# Patient Record
Sex: Female | Born: 1970
Health system: Southern US, Community
[De-identification: ages and names within clinical notes are randomized; demographics above are authoritative.]

## PROBLEM LIST (undated history)

## (undated) ENCOUNTER — Ambulatory Visit (HOSPITAL_COMMUNITY): Admission: EM | Payer: 59

## (undated) DIAGNOSIS — E11311 Type 2 diabetes mellitus with unspecified diabetic retinopathy with macular edema: Secondary | ICD-10-CM

## (undated) DIAGNOSIS — Z9889 Other specified postprocedural states: Secondary | ICD-10-CM

## (undated) DIAGNOSIS — I10 Essential (primary) hypertension: Secondary | ICD-10-CM

## (undated) DIAGNOSIS — E113413 Type 2 diabetes mellitus with severe nonproliferative diabetic retinopathy with macular edema, bilateral: Secondary | ICD-10-CM

## (undated) DIAGNOSIS — E1122 Type 2 diabetes mellitus with diabetic chronic kidney disease: Secondary | ICD-10-CM

## (undated) DIAGNOSIS — B001 Herpesviral vesicular dermatitis: Secondary | ICD-10-CM

## (undated) DIAGNOSIS — E1142 Type 2 diabetes mellitus with diabetic polyneuropathy: Secondary | ICD-10-CM

## (undated) DIAGNOSIS — Z201 Contact with and (suspected) exposure to tuberculosis: Secondary | ICD-10-CM

## (undated) DIAGNOSIS — G709 Myoneural disorder, unspecified: Secondary | ICD-10-CM

## (undated) DIAGNOSIS — G4730 Sleep apnea, unspecified: Secondary | ICD-10-CM

## (undated) DIAGNOSIS — F329 Major depressive disorder, single episode, unspecified: Secondary | ICD-10-CM

## (undated) DIAGNOSIS — R112 Nausea with vomiting, unspecified: Secondary | ICD-10-CM

## (undated) DIAGNOSIS — D509 Iron deficiency anemia, unspecified: Secondary | ICD-10-CM

## (undated) DIAGNOSIS — I471 Supraventricular tachycardia: Secondary | ICD-10-CM

## (undated) DIAGNOSIS — E785 Hyperlipidemia, unspecified: Secondary | ICD-10-CM

## (undated) DIAGNOSIS — N920 Excessive and frequent menstruation with regular cycle: Secondary | ICD-10-CM

## (undated) DIAGNOSIS — J069 Acute upper respiratory infection, unspecified: Secondary | ICD-10-CM

## (undated) DIAGNOSIS — M810 Age-related osteoporosis without current pathological fracture: Secondary | ICD-10-CM

## (undated) DIAGNOSIS — C069 Malignant neoplasm of mouth, unspecified: Secondary | ICD-10-CM

## (undated) DIAGNOSIS — Z8669 Personal history of other diseases of the nervous system and sense organs: Secondary | ICD-10-CM

## (undated) DIAGNOSIS — J309 Allergic rhinitis, unspecified: Secondary | ICD-10-CM

## (undated) DIAGNOSIS — A159 Respiratory tuberculosis unspecified: Secondary | ICD-10-CM

## (undated) DIAGNOSIS — E1140 Type 2 diabetes mellitus with diabetic neuropathy, unspecified: Secondary | ICD-10-CM

## (undated) DIAGNOSIS — T7840XA Allergy, unspecified, initial encounter: Secondary | ICD-10-CM

## (undated) DIAGNOSIS — H35039 Hypertensive retinopathy, unspecified eye: Secondary | ICD-10-CM

## (undated) DIAGNOSIS — H259 Unspecified age-related cataract: Secondary | ICD-10-CM

## (undated) DIAGNOSIS — N63 Unspecified lump in unspecified breast: Secondary | ICD-10-CM

## (undated) DIAGNOSIS — F5081 Binge eating disorder: Secondary | ICD-10-CM

## (undated) HISTORY — DX: Allergy, unspecified, initial encounter: T78.40XA

## (undated) HISTORY — DX: Age-related osteoporosis without current pathological fracture: M81.0

## (undated) HISTORY — DX: Sleep apnea, unspecified: G47.30

## (undated) HISTORY — DX: Type 2 diabetes mellitus with diabetic polyneuropathy: E11.42

## (undated) HISTORY — DX: Other specified postprocedural states: R11.2

## (undated) HISTORY — DX: Type 2 diabetes mellitus with unspecified diabetic retinopathy with macular edema: E11.311

## (undated) HISTORY — PX: TONSILLECTOMY: SUR1361

## (undated) HISTORY — DX: Myoneural disorder, unspecified: G70.9

## (undated) HISTORY — DX: Acute upper respiratory infection, unspecified: J06.9

## (undated) HISTORY — DX: Hyperlipidemia, unspecified: E78.5

## (undated) HISTORY — DX: Herpesviral vesicular dermatitis: B00.1

## (undated) HISTORY — DX: Nausea with vomiting, unspecified: R11.2

## (undated) HISTORY — PX: REDUCTION MAMMAPLASTY: SUR839

## (undated) HISTORY — DX: Respiratory tuberculosis unspecified: A15.9

## (undated) HISTORY — DX: Other specified postprocedural states: Z98.890

## (undated) HISTORY — DX: Hypertensive retinopathy, unspecified eye: H35.039

## (undated) HISTORY — DX: Personal history of other diseases of the nervous system and sense organs: Z86.69

## (undated) HISTORY — PX: WISDOM TOOTH EXTRACTION: SHX21

## (undated) HISTORY — DX: Essential (primary) hypertension: I10

## (undated) HISTORY — DX: Excessive and frequent menstruation with regular cycle: N92.0

## (undated) HISTORY — PX: BREAST SURGERY: SHX581

## (undated) HISTORY — PX: PHOTOCOAGULATION WITH LASER: SHX6027

---
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
## 1997-05-12 ENCOUNTER — Encounter: Admission: RE | Admit: 1997-05-12 | Discharge: 1997-05-12 | Payer: Self-pay | Admitting: Sports Medicine

## 1997-05-29 ENCOUNTER — Inpatient Hospital Stay (HOSPITAL_COMMUNITY): Admission: AD | Admit: 1997-05-29 | Discharge: 1997-05-29 | Payer: Self-pay | Admitting: *Deleted

## 1997-05-31 ENCOUNTER — Encounter: Admission: RE | Admit: 1997-05-31 | Discharge: 1997-05-31 | Payer: Self-pay | Admitting: Family Medicine

## 1997-06-03 ENCOUNTER — Encounter: Admission: RE | Admit: 1997-06-03 | Discharge: 1997-06-03 | Payer: Self-pay | Admitting: Family Medicine

## 1997-06-14 ENCOUNTER — Inpatient Hospital Stay (HOSPITAL_COMMUNITY): Admission: AD | Admit: 1997-06-14 | Discharge: 1997-06-14 | Payer: Self-pay | Admitting: Obstetrics

## 1997-06-21 ENCOUNTER — Encounter: Admission: RE | Admit: 1997-06-21 | Discharge: 1997-06-21 | Payer: Self-pay | Admitting: Family Medicine

## 1997-07-30 ENCOUNTER — Encounter: Admission: RE | Admit: 1997-07-30 | Discharge: 1997-07-30 | Payer: Self-pay | Admitting: Family Medicine

## 1997-08-02 ENCOUNTER — Observation Stay (HOSPITAL_COMMUNITY): Admission: AD | Admit: 1997-08-02 | Discharge: 1997-08-03 | Payer: Self-pay | Admitting: Obstetrics

## 1997-08-05 ENCOUNTER — Encounter: Admission: RE | Admit: 1997-08-05 | Discharge: 1997-08-05 | Payer: Self-pay | Admitting: Family Medicine

## 1997-09-03 ENCOUNTER — Encounter: Admission: RE | Admit: 1997-09-03 | Discharge: 1997-09-03 | Payer: Self-pay | Admitting: Family Medicine

## 1997-09-08 ENCOUNTER — Encounter: Admission: RE | Admit: 1997-09-08 | Discharge: 1997-09-08 | Payer: Self-pay | Admitting: Family Medicine

## 1997-09-22 ENCOUNTER — Encounter: Admission: RE | Admit: 1997-09-22 | Discharge: 1997-09-22 | Payer: Self-pay | Admitting: Family Medicine

## 1998-03-04 ENCOUNTER — Encounter: Admission: RE | Admit: 1998-03-04 | Discharge: 1998-03-04 | Payer: Self-pay | Admitting: Family Medicine

## 1998-06-01 ENCOUNTER — Encounter: Admission: RE | Admit: 1998-06-01 | Discharge: 1998-06-01 | Payer: Self-pay | Admitting: Family Medicine

## 1998-06-08 ENCOUNTER — Encounter: Admission: RE | Admit: 1998-06-08 | Discharge: 1998-06-08 | Payer: Self-pay | Admitting: Family Medicine

## 1998-06-22 ENCOUNTER — Encounter: Admission: RE | Admit: 1998-06-22 | Discharge: 1998-06-22 | Payer: Self-pay | Admitting: Family Medicine

## 1998-09-09 ENCOUNTER — Encounter: Admission: RE | Admit: 1998-09-09 | Discharge: 1998-09-09 | Payer: Self-pay | Admitting: Family Medicine

## 1998-11-29 ENCOUNTER — Encounter: Admission: RE | Admit: 1998-11-29 | Discharge: 1998-11-29 | Payer: Self-pay | Admitting: Family Medicine

## 1998-11-29 ENCOUNTER — Other Ambulatory Visit: Admission: RE | Admit: 1998-11-29 | Discharge: 1998-11-29 | Payer: Self-pay | Admitting: *Deleted

## 1998-12-02 ENCOUNTER — Encounter: Admission: RE | Admit: 1998-12-02 | Discharge: 1998-12-02 | Payer: Self-pay | Admitting: Sports Medicine

## 1999-01-18 ENCOUNTER — Encounter: Admission: RE | Admit: 1999-01-18 | Discharge: 1999-01-18 | Payer: Self-pay | Admitting: Family Medicine

## 1999-03-08 ENCOUNTER — Encounter: Admission: RE | Admit: 1999-03-08 | Discharge: 1999-03-08 | Payer: Self-pay | Admitting: Family Medicine

## 1999-06-21 ENCOUNTER — Encounter: Admission: RE | Admit: 1999-06-21 | Discharge: 1999-06-21 | Payer: Self-pay | Admitting: Family Medicine

## 1999-07-20 ENCOUNTER — Encounter: Admission: RE | Admit: 1999-07-20 | Discharge: 1999-07-20 | Payer: Self-pay | Admitting: Family Medicine

## 1999-08-11 ENCOUNTER — Encounter: Admission: RE | Admit: 1999-08-11 | Discharge: 1999-08-11 | Payer: Self-pay | Admitting: Family Medicine

## 1999-08-16 ENCOUNTER — Encounter: Payer: Self-pay | Admitting: Family Medicine

## 1999-08-16 ENCOUNTER — Encounter: Admission: RE | Admit: 1999-08-16 | Discharge: 1999-08-16 | Payer: Self-pay | Admitting: Family Medicine

## 1999-08-22 ENCOUNTER — Encounter: Admission: RE | Admit: 1999-08-22 | Discharge: 1999-11-20 | Payer: Self-pay | Admitting: Family Medicine

## 1999-09-01 ENCOUNTER — Encounter: Admission: RE | Admit: 1999-09-01 | Discharge: 1999-09-01 | Payer: Self-pay | Admitting: Family Medicine

## 1999-11-21 ENCOUNTER — Other Ambulatory Visit: Admission: RE | Admit: 1999-11-21 | Discharge: 1999-11-21 | Payer: Self-pay | Admitting: Family Medicine

## 1999-12-19 ENCOUNTER — Encounter: Admission: RE | Admit: 1999-12-19 | Discharge: 1999-12-19 | Payer: Self-pay | Admitting: Sports Medicine

## 2000-03-10 ENCOUNTER — Emergency Department (HOSPITAL_COMMUNITY): Admission: EM | Admit: 2000-03-10 | Discharge: 2000-03-10 | Payer: Self-pay | Admitting: Emergency Medicine

## 2000-03-10 ENCOUNTER — Encounter: Payer: Self-pay | Admitting: Emergency Medicine

## 2000-04-03 ENCOUNTER — Encounter: Admission: RE | Admit: 2000-04-03 | Discharge: 2000-04-03 | Payer: Self-pay | Admitting: Family Medicine

## 2000-04-17 ENCOUNTER — Encounter: Admission: RE | Admit: 2000-04-17 | Discharge: 2000-04-17 | Payer: Self-pay | Admitting: Family Medicine

## 2000-07-16 ENCOUNTER — Encounter: Admission: RE | Admit: 2000-07-16 | Discharge: 2000-07-16 | Payer: Self-pay | Admitting: Family Medicine

## 2000-08-23 ENCOUNTER — Encounter: Admission: RE | Admit: 2000-08-23 | Discharge: 2000-08-23 | Payer: Self-pay | Admitting: Family Medicine

## 2000-09-17 ENCOUNTER — Encounter: Admission: RE | Admit: 2000-09-17 | Discharge: 2000-09-17 | Payer: Self-pay | Admitting: Family Medicine

## 2001-01-01 ENCOUNTER — Encounter: Admission: RE | Admit: 2001-01-01 | Discharge: 2001-01-01 | Payer: Self-pay | Admitting: Family Medicine

## 2001-04-02 ENCOUNTER — Encounter: Admission: RE | Admit: 2001-04-02 | Discharge: 2001-04-02 | Payer: Self-pay | Admitting: Family Medicine

## 2001-04-09 ENCOUNTER — Encounter: Admission: RE | Admit: 2001-04-09 | Discharge: 2001-04-09 | Payer: Self-pay | Admitting: Family Medicine

## 2001-04-09 ENCOUNTER — Other Ambulatory Visit: Admission: RE | Admit: 2001-04-09 | Discharge: 2001-04-09 | Payer: Self-pay | Admitting: Family Medicine

## 2001-04-11 ENCOUNTER — Encounter: Admission: RE | Admit: 2001-04-11 | Discharge: 2001-04-11 | Payer: Self-pay | Admitting: Family Medicine

## 2001-04-17 ENCOUNTER — Encounter: Admission: RE | Admit: 2001-04-17 | Discharge: 2001-04-17 | Payer: Self-pay | Admitting: Family Medicine

## 2001-05-02 ENCOUNTER — Encounter: Admission: RE | Admit: 2001-05-02 | Discharge: 2001-05-02 | Payer: Self-pay | Admitting: Family Medicine

## 2001-06-03 ENCOUNTER — Encounter: Admission: RE | Admit: 2001-06-03 | Discharge: 2001-06-03 | Payer: Self-pay | Admitting: Family Medicine

## 2001-07-02 ENCOUNTER — Encounter: Admission: RE | Admit: 2001-07-02 | Discharge: 2001-07-02 | Payer: Self-pay | Admitting: Family Medicine

## 2001-09-09 ENCOUNTER — Encounter: Admission: RE | Admit: 2001-09-09 | Discharge: 2001-09-09 | Payer: Self-pay | Admitting: Family Medicine

## 2002-01-30 ENCOUNTER — Encounter: Admission: RE | Admit: 2002-01-30 | Discharge: 2002-01-30 | Payer: Self-pay | Admitting: Family Medicine

## 2002-05-18 ENCOUNTER — Encounter: Admission: RE | Admit: 2002-05-18 | Discharge: 2002-05-18 | Payer: Self-pay | Admitting: Family Medicine

## 2002-06-10 ENCOUNTER — Encounter: Admission: RE | Admit: 2002-06-10 | Discharge: 2002-06-10 | Payer: Self-pay | Admitting: Family Medicine

## 2002-11-07 IMAGING — CR DG CHEST 1V PORT
1 series · 1 of 1 positions shown · non-contrast
Comparison: None.  
 PORTABLE CHEST - 1 VIEW [DATE]:

CLINICAL DATA: Chest pain and headaches.

[view not recorded]
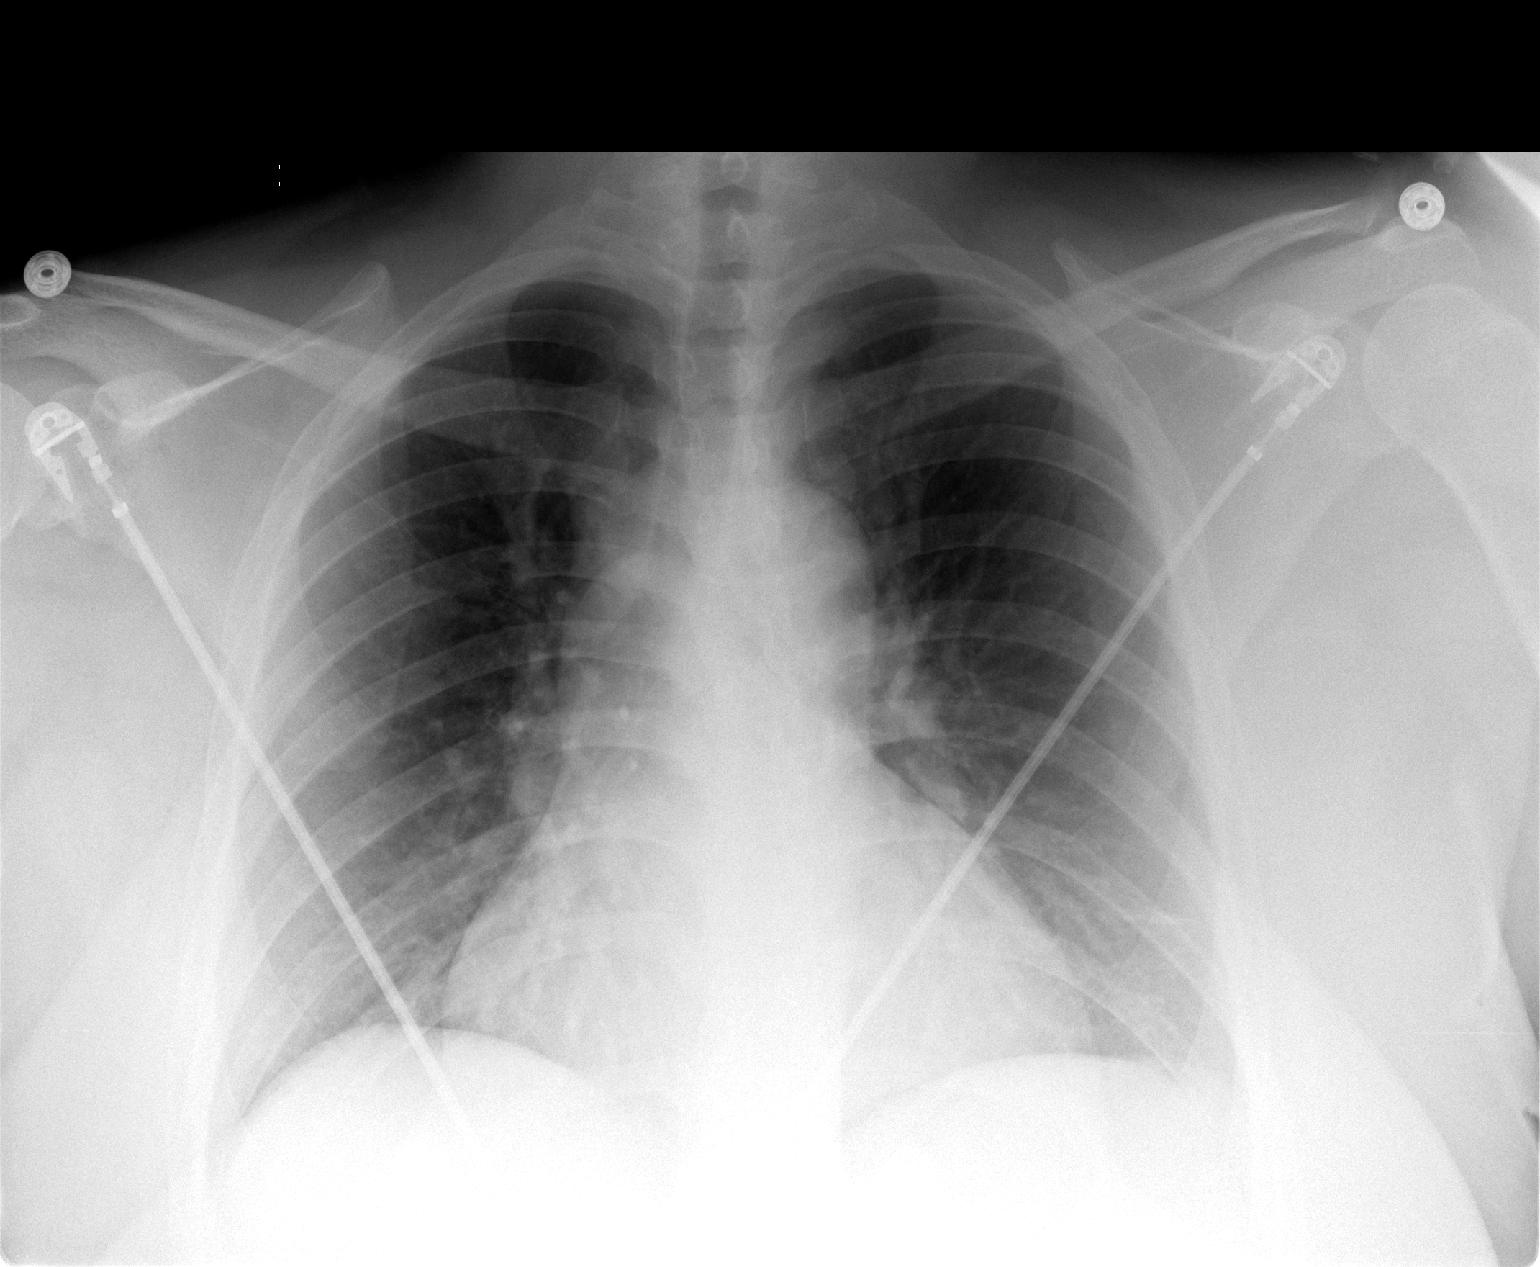

[1 of 1 positions shown; findings below may reference images not displayed]

FINDINGS: AP film at [XH] hours shows low volumes with accentuation of the cardio-pericardial silhouette.  Minimal atelectasis noted at the left base.  No edema or focal infiltrate.
IMPRESSION: 1.  Low volume film accentuates the cardiac silhouette.  Two view chest x-ray is recommended to more thoroughly characterize cardiomediastinal anatomy.  
 2.  Atelectasis of the left base.

## 2002-11-23 ENCOUNTER — Encounter: Admission: RE | Admit: 2002-11-23 | Discharge: 2002-11-23 | Payer: Self-pay | Admitting: Family Medicine

## 2003-06-10 ENCOUNTER — Encounter: Admission: RE | Admit: 2003-06-10 | Discharge: 2003-06-10 | Payer: Self-pay | Admitting: Family Medicine

## 2003-07-01 ENCOUNTER — Encounter: Admission: RE | Admit: 2003-07-01 | Discharge: 2003-07-01 | Payer: Self-pay | Admitting: Family Medicine

## 2003-11-09 ENCOUNTER — Ambulatory Visit: Payer: Self-pay | Admitting: Family Medicine

## 2003-11-26 ENCOUNTER — Ambulatory Visit: Payer: Self-pay | Admitting: Sports Medicine

## 2003-11-26 ENCOUNTER — Other Ambulatory Visit: Admission: RE | Admit: 2003-11-26 | Discharge: 2003-11-26 | Payer: Self-pay | Admitting: Family Medicine

## 2004-02-24 ENCOUNTER — Ambulatory Visit: Payer: Self-pay | Admitting: Family Medicine

## 2004-03-01 ENCOUNTER — Ambulatory Visit: Payer: Self-pay | Admitting: Family Medicine

## 2004-07-01 ENCOUNTER — Emergency Department (HOSPITAL_COMMUNITY): Admission: EM | Admit: 2004-07-01 | Discharge: 2004-07-01 | Payer: Self-pay | Admitting: Emergency Medicine

## 2004-07-05 ENCOUNTER — Emergency Department (HOSPITAL_COMMUNITY): Admission: EM | Admit: 2004-07-05 | Discharge: 2004-07-05 | Payer: Self-pay | Admitting: *Deleted

## 2004-07-07 ENCOUNTER — Ambulatory Visit: Payer: Self-pay | Admitting: Family Medicine

## 2004-10-03 ENCOUNTER — Ambulatory Visit: Payer: Self-pay | Admitting: Family Medicine

## 2005-01-22 ENCOUNTER — Ambulatory Visit: Payer: Self-pay | Admitting: Family Medicine

## 2005-03-12 ENCOUNTER — Ambulatory Visit: Payer: Self-pay | Admitting: Sports Medicine

## 2005-05-17 ENCOUNTER — Emergency Department (HOSPITAL_COMMUNITY): Admission: EM | Admit: 2005-05-17 | Discharge: 2005-05-17 | Payer: Self-pay | Admitting: Emergency Medicine

## 2005-05-17 IMAGING — US US ABDOMEN COMPLETE
1 series · 14 of 25 positions shown · non-contrast
Comparison: none

CLINICAL DATA: Abdominal pain with nausea and some itching.
 ABDOMEN ULTRASOUND:
TECHNIQUE: Complete abdominal ultrasound examination was performed including evaluation of the liver, gallbladder, bile ducts, pancreas, kidneys, spleen, IVC, and abdominal aorta.

[Series 1: unknown · 0.37mm/px · 14 of 53 slices shown]
[im 1/53]
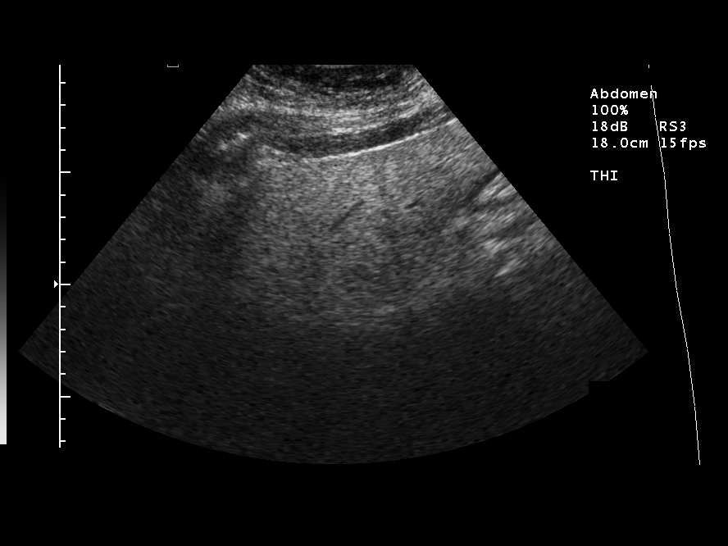
[im 5/53]
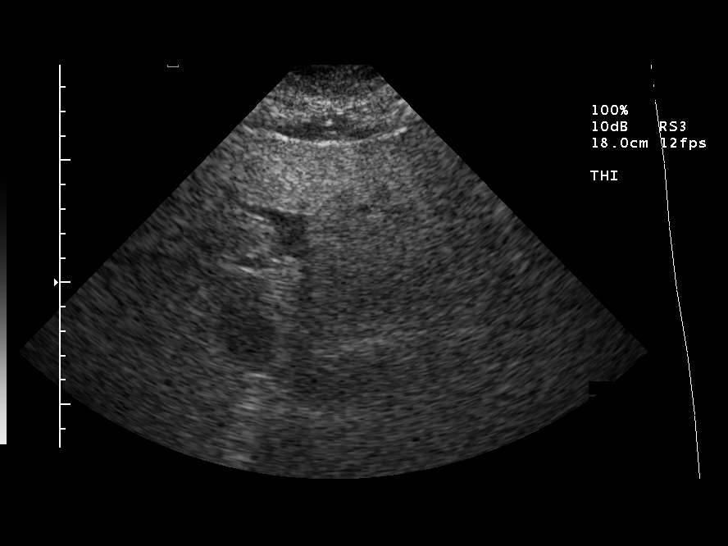
[im 9/53]
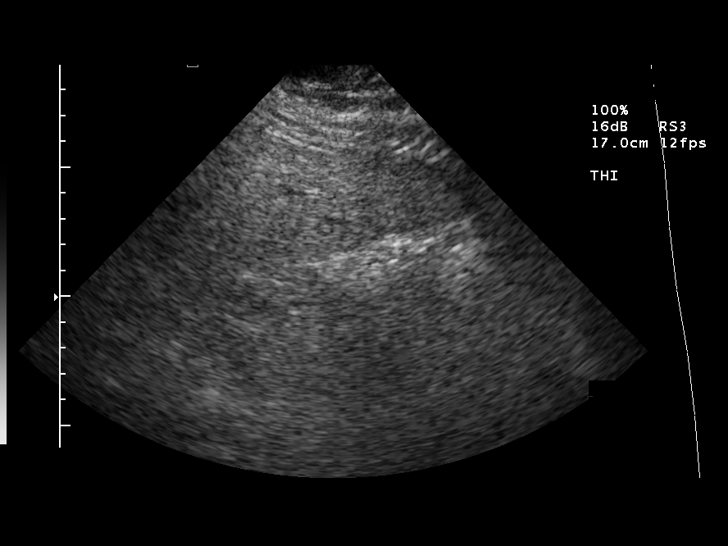
[im 14/53]
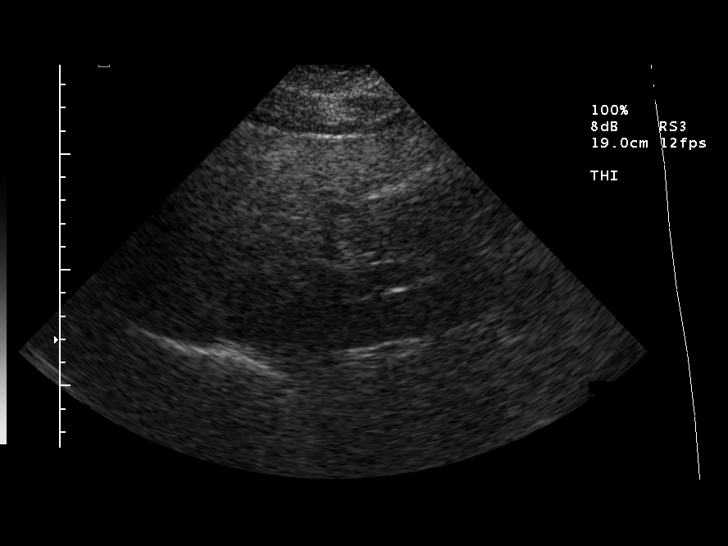
[im 18/53]
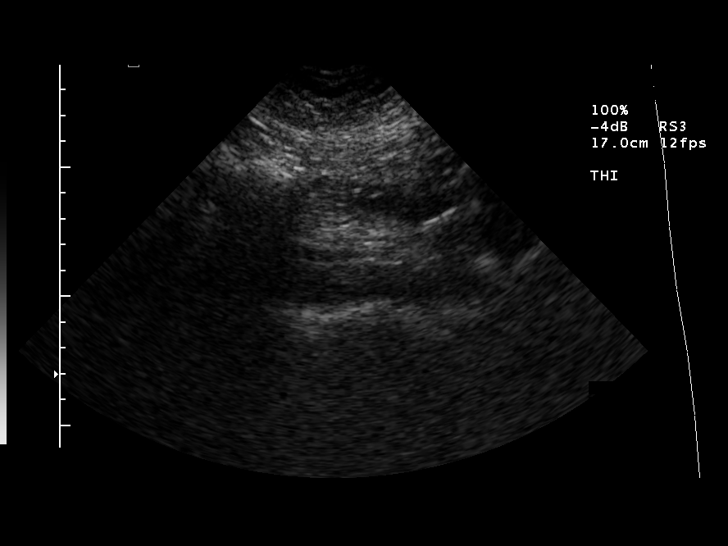
[im 20/53]
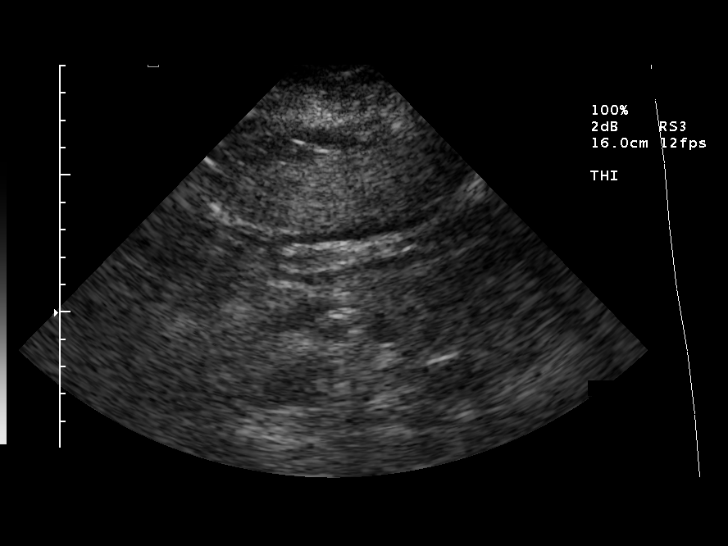
[im 24/53]
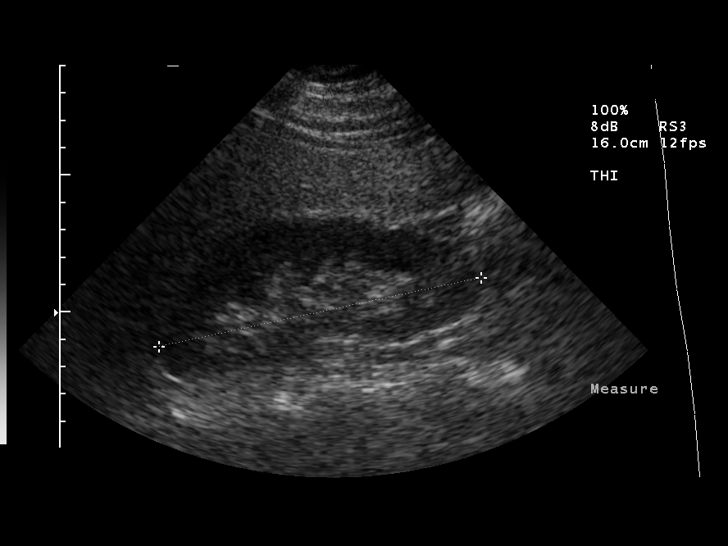
[im 29/53]
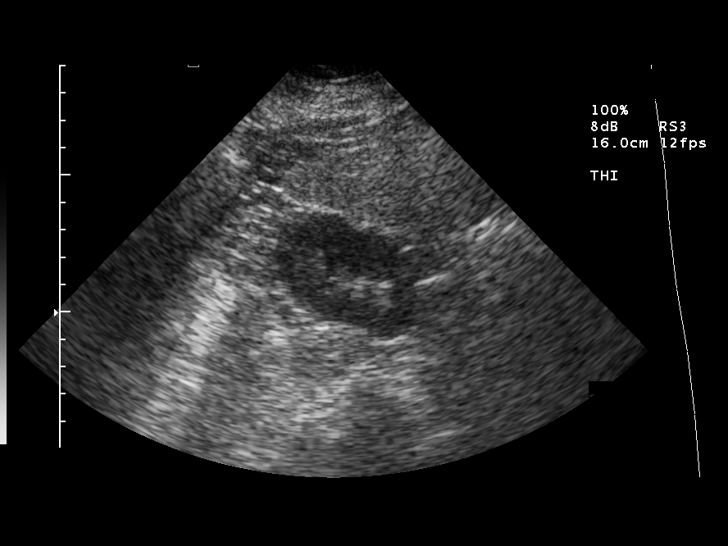
[im 33/53]
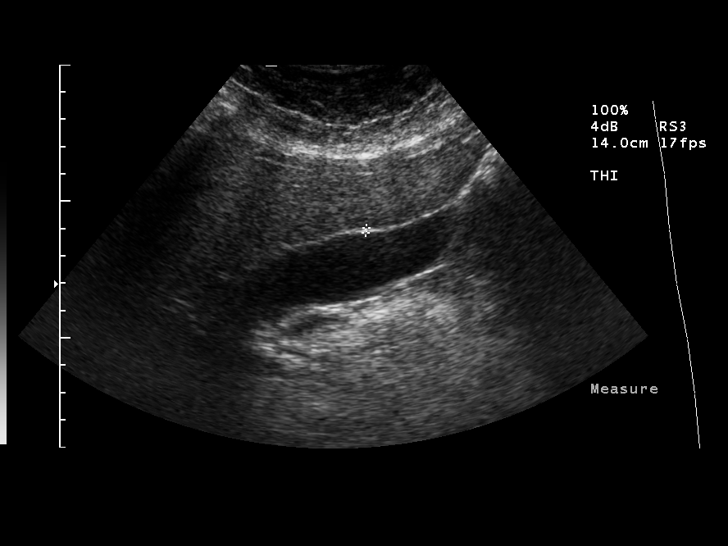
[im 35/53]
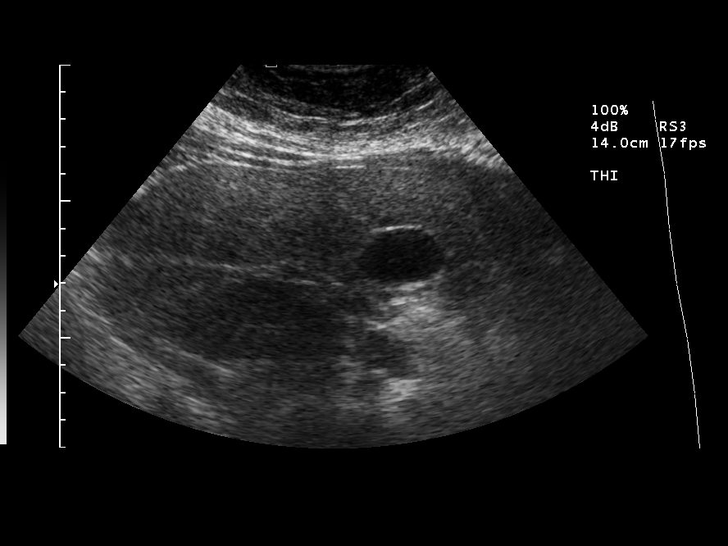
[im 40/53]
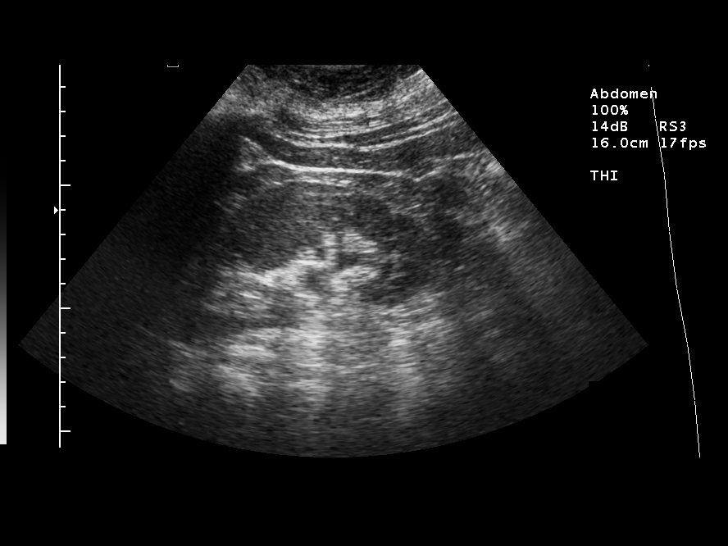
[im 44/53]
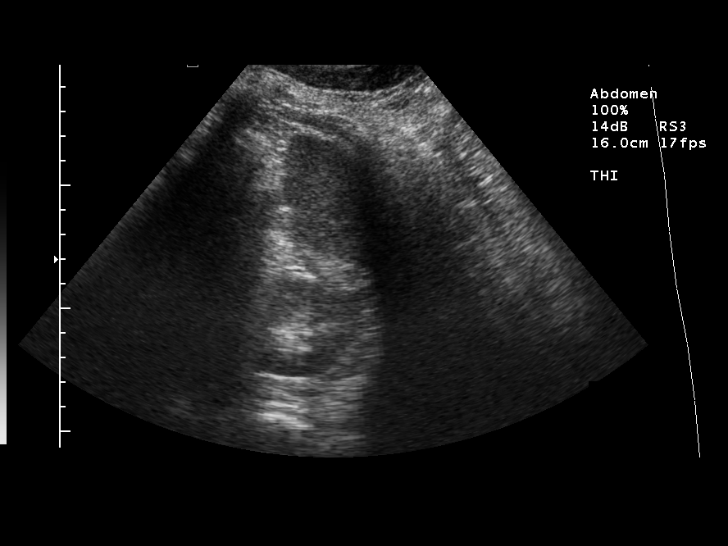
[im 48/53]
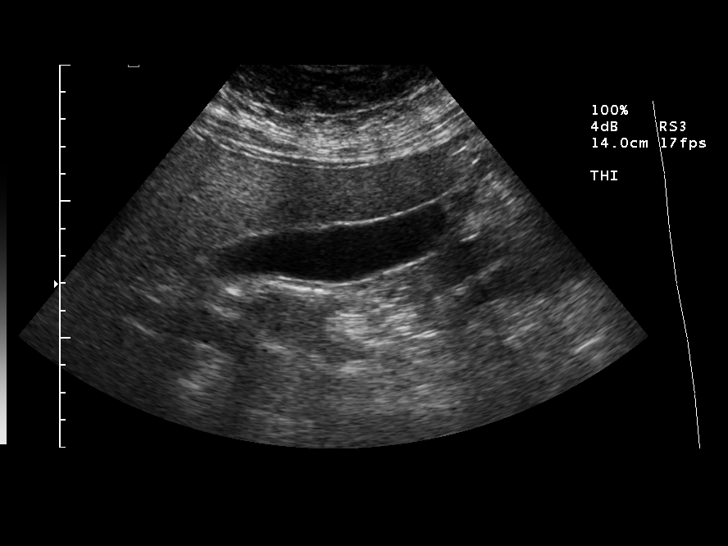
[im 53/53]
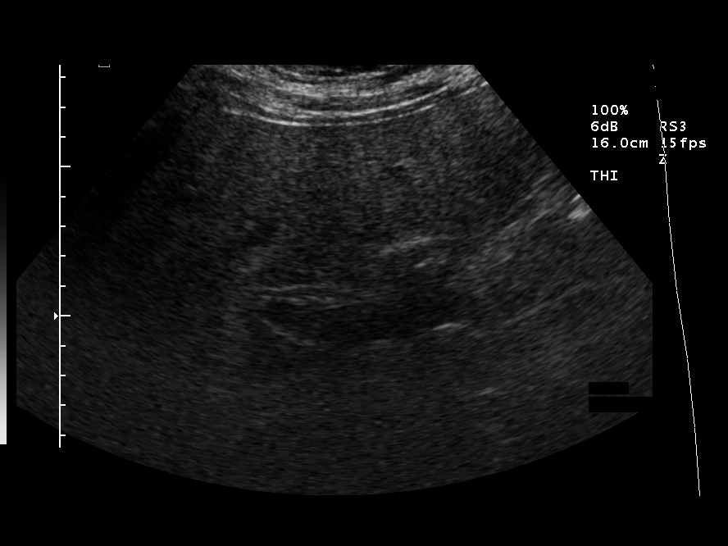

[14 of 25 positions shown; findings below may reference images not displayed]

FINDINGS: Multiple scans of the abdomen are made and show the gallbladder to be normal with a wall thickness of 1.8 mm.  The common bile duct measures 4.4 mm in diameter.  The liver is dense and echogenic consistent with fatty infiltration.  The IVC and pancreas appear normal.  The spleen is normal measuring 11.2 cm.  The right kidney is normal measuring 12.0 cm.  The left kidney is normal and measures 11.2 cm.  The abdominal aorta is normal and measures 2.2 cm.
IMPRESSION: The liver is diffusely dense and echogenic with no focal mass.  The gallbladder, common bile duct, and pancreas appear normal.

## 2005-06-14 ENCOUNTER — Ambulatory Visit: Payer: Self-pay | Admitting: Family Medicine

## 2005-06-15 ENCOUNTER — Encounter: Admission: RE | Admit: 2005-06-15 | Discharge: 2005-06-15 | Payer: Self-pay | Admitting: Family Medicine

## 2005-06-15 IMAGING — CR DG HAND COMPLETE 3+V*L*
3 series · 3 of 3 positions shown · non-contrast
Comparison: none

CLINICAL DATA: Pain and swelling of 2nd to 5th digits.  No known injury. 
LEFT HAND ? 3 VIEW:

[view not recorded (1 of 3)]
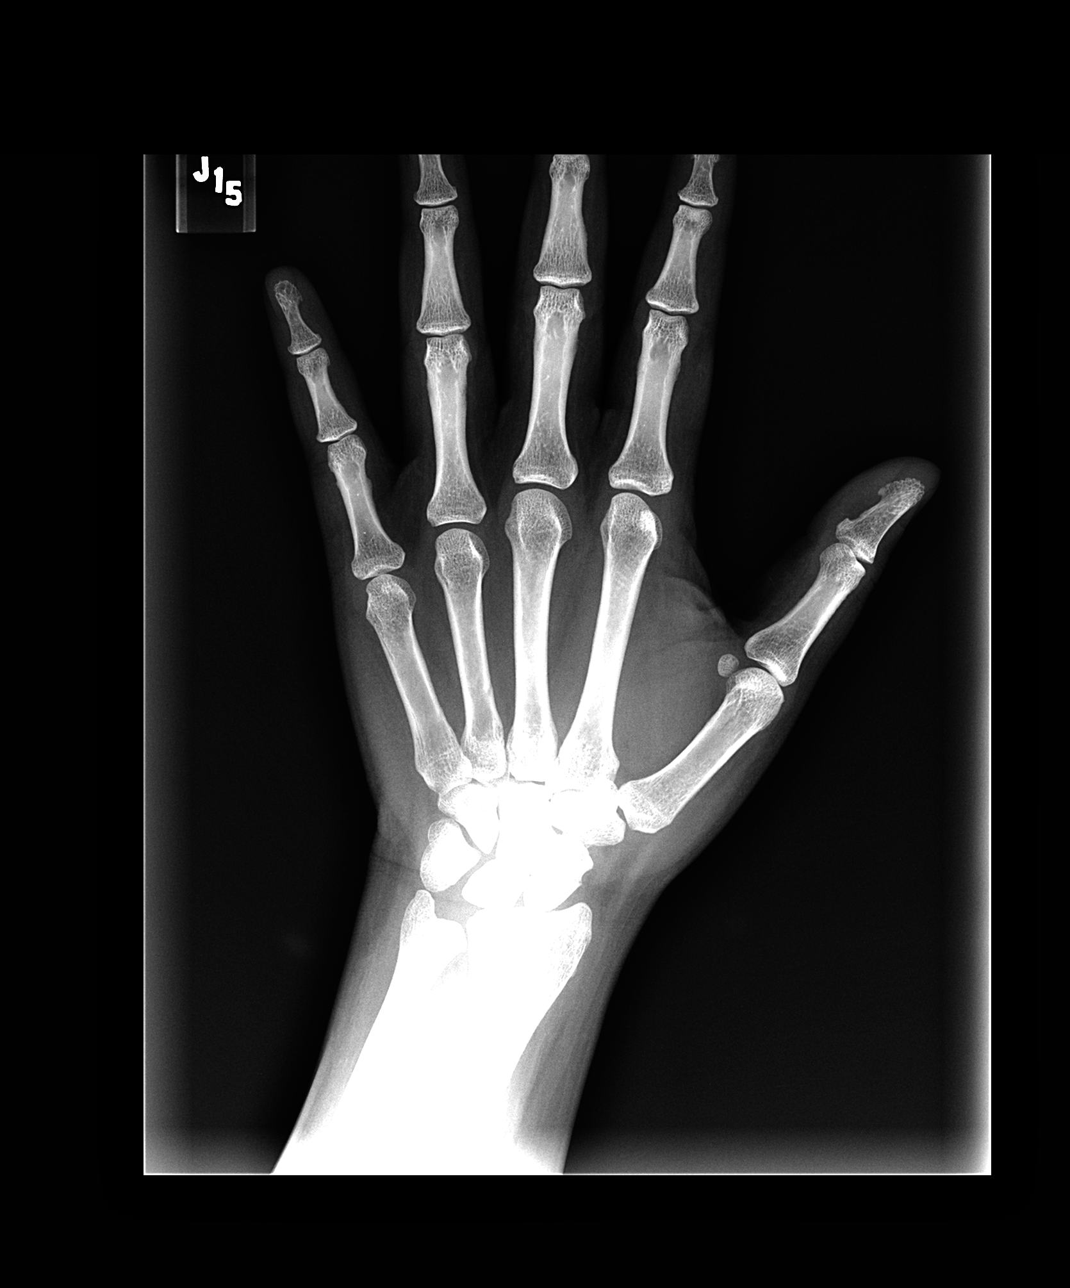

[view not recorded (2 of 3)]
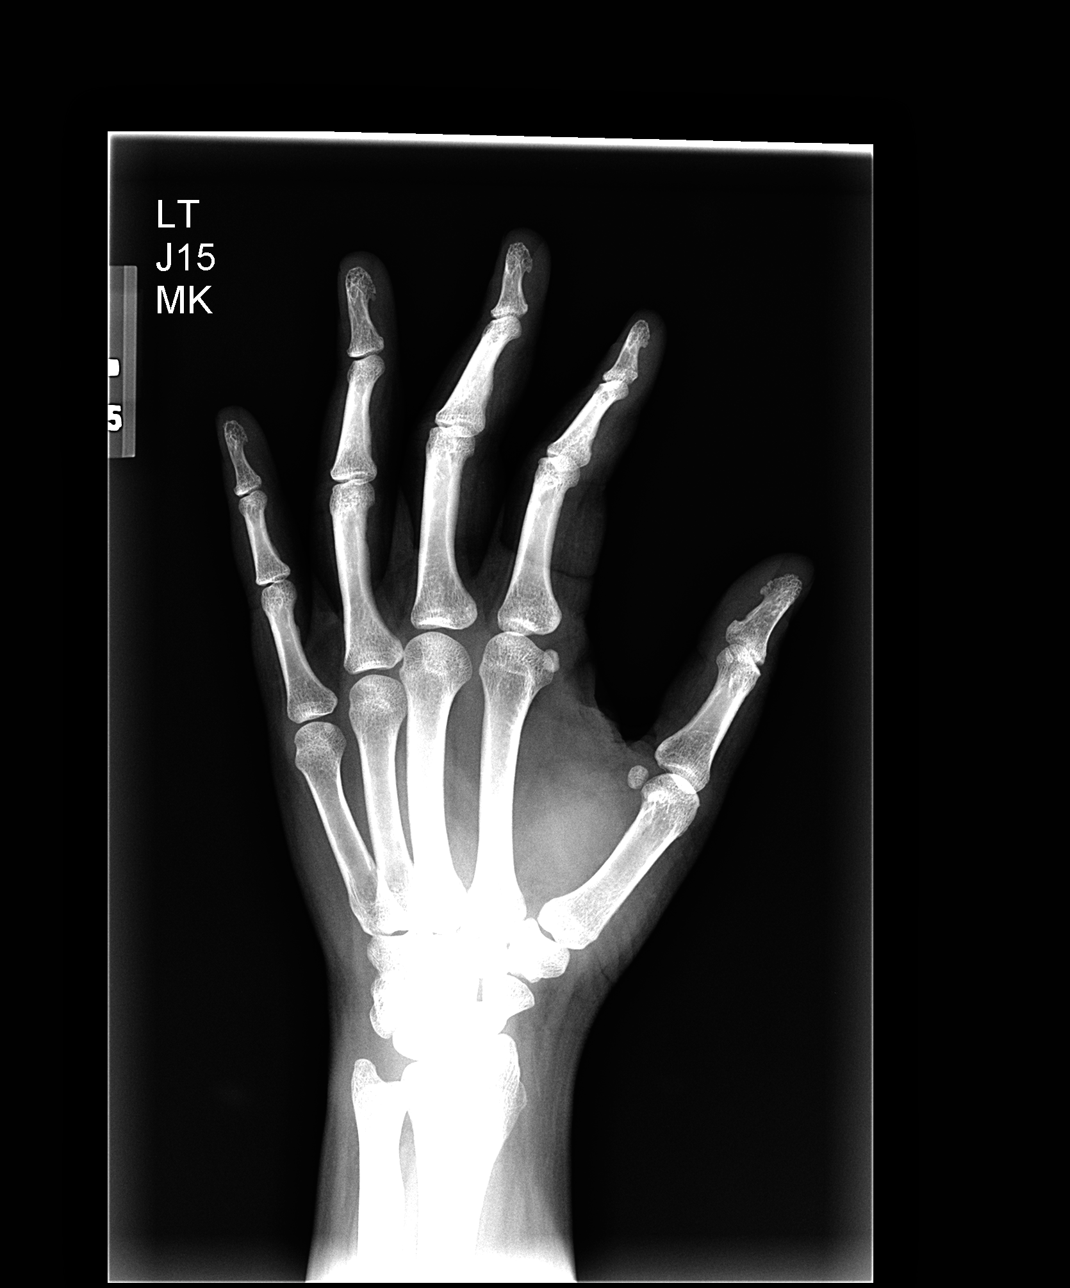

[view not recorded (3 of 3)]
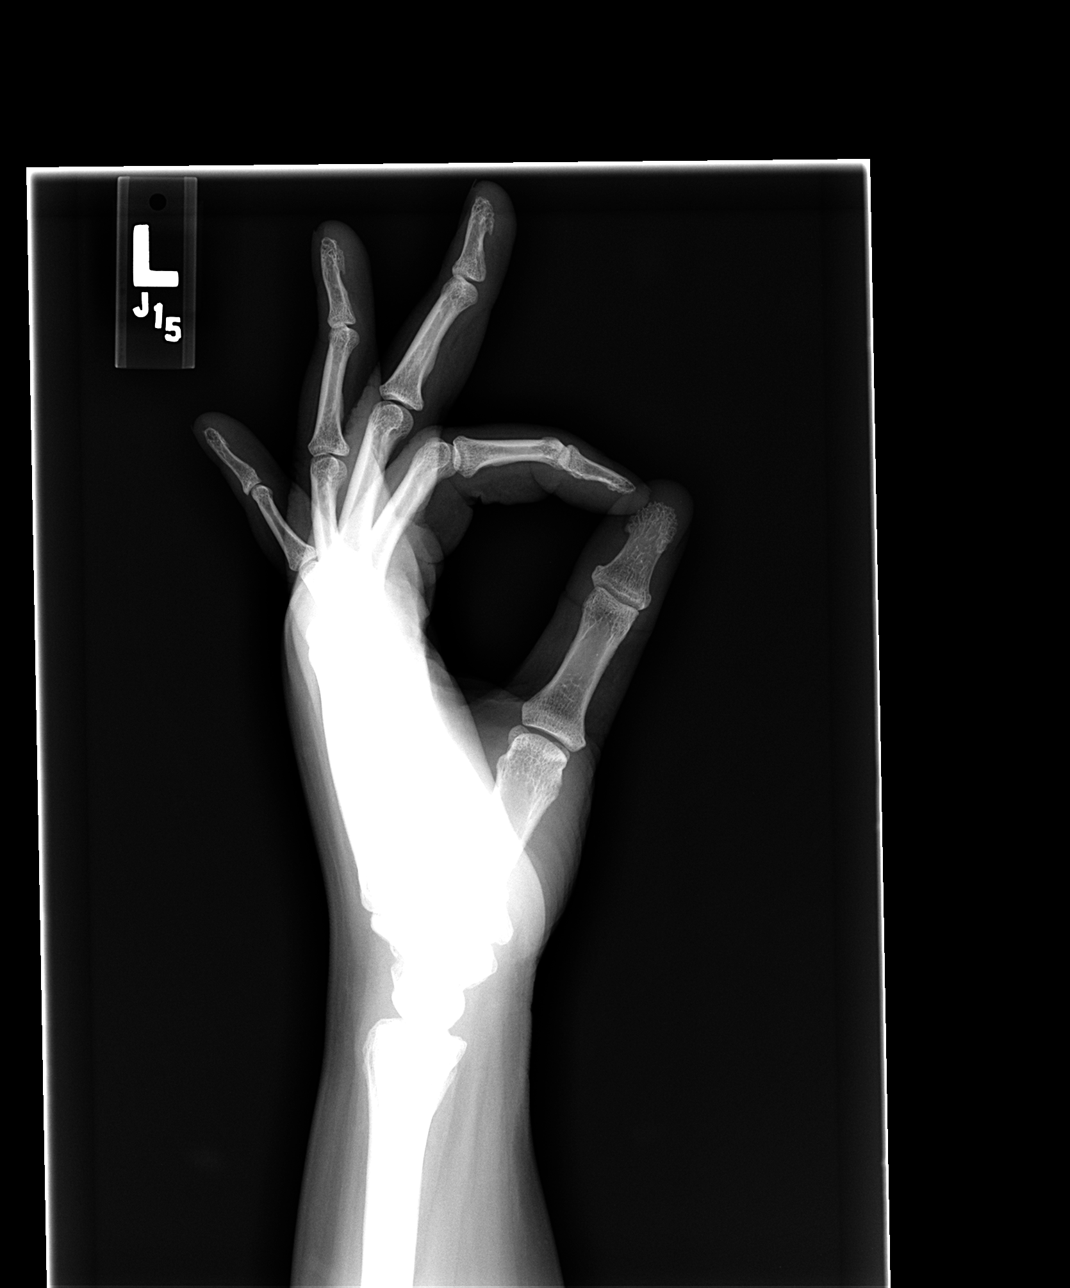

[3 of 3 positions shown; findings below may reference images not displayed]

FINDINGS: Joint spaces and alignment are maintained.  No erosions are identified.  Soft tissues appear normal.  There is no fracture or dislocation.  The patient has mild ulnar minus variance.
IMPRESSION: No acute finding.  Negative for inflammatory or degenerative arthropathy. 
RIGHT HAND ? 3 VIEW:
FINDINGS: Joint spaces and alignment are maintained.  No erosions are identified.  No soft tissue swelling is seen.  Mild ulnar minus variance is noted.
IMPRESSION: Negative for inflammatory or degenerative arthropathy.  No acute finding.

## 2005-06-15 IMAGING — CR DG HAND COMPLETE 3+V*R*
3 series · 3 of 3 positions shown · non-contrast
Comparison: none

CLINICAL DATA: Pain and swelling of 2nd to 5th digits.  No known injury. 
LEFT HAND ? 3 VIEW:

[view not recorded (1 of 3)]
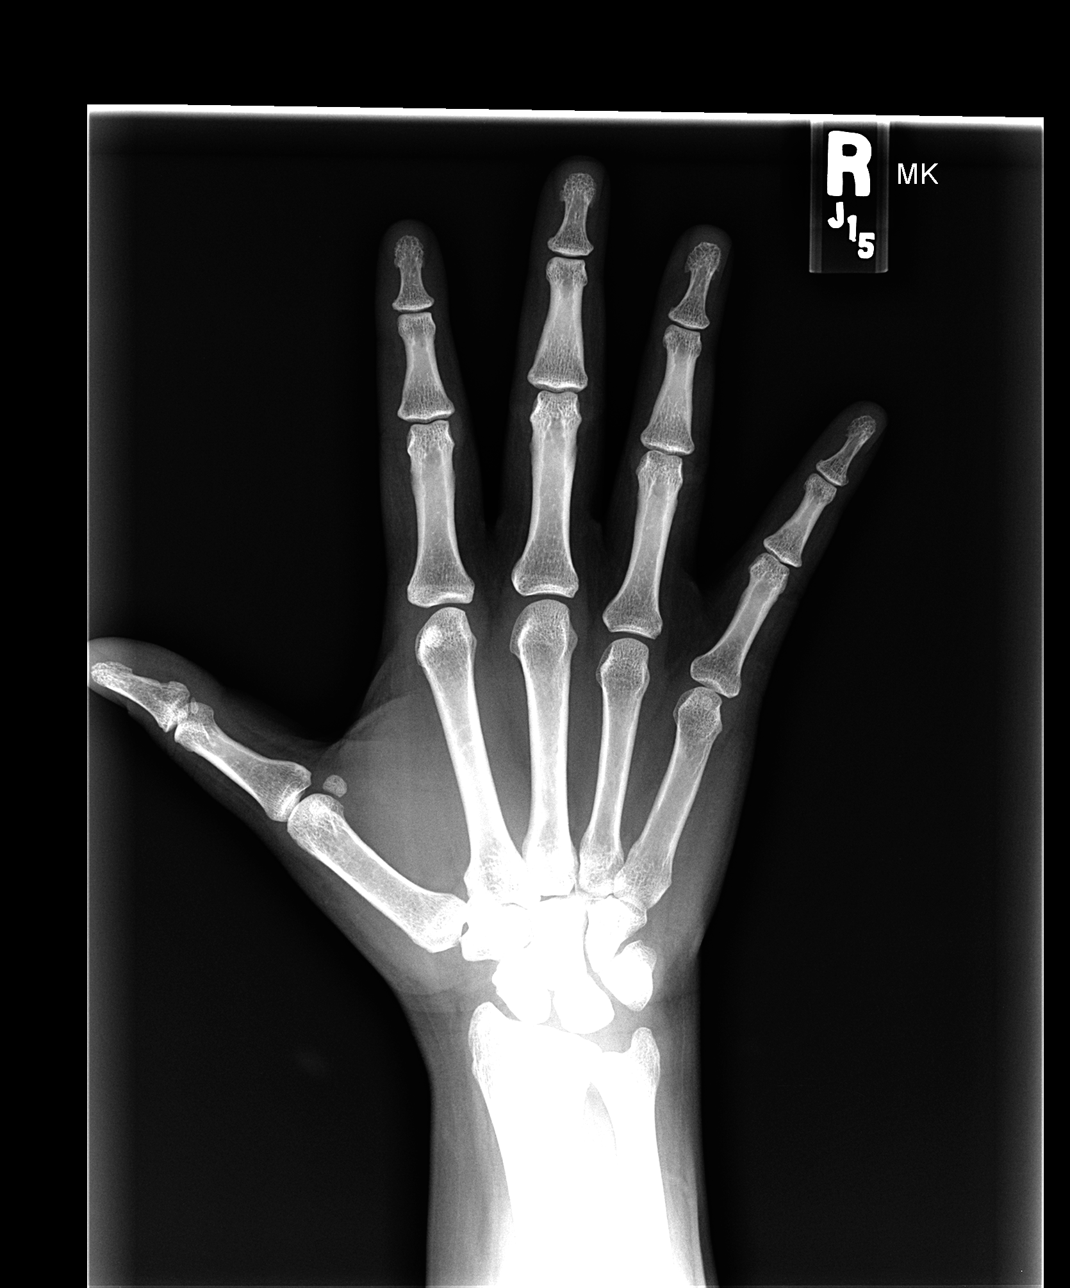

[view not recorded (2 of 3)]
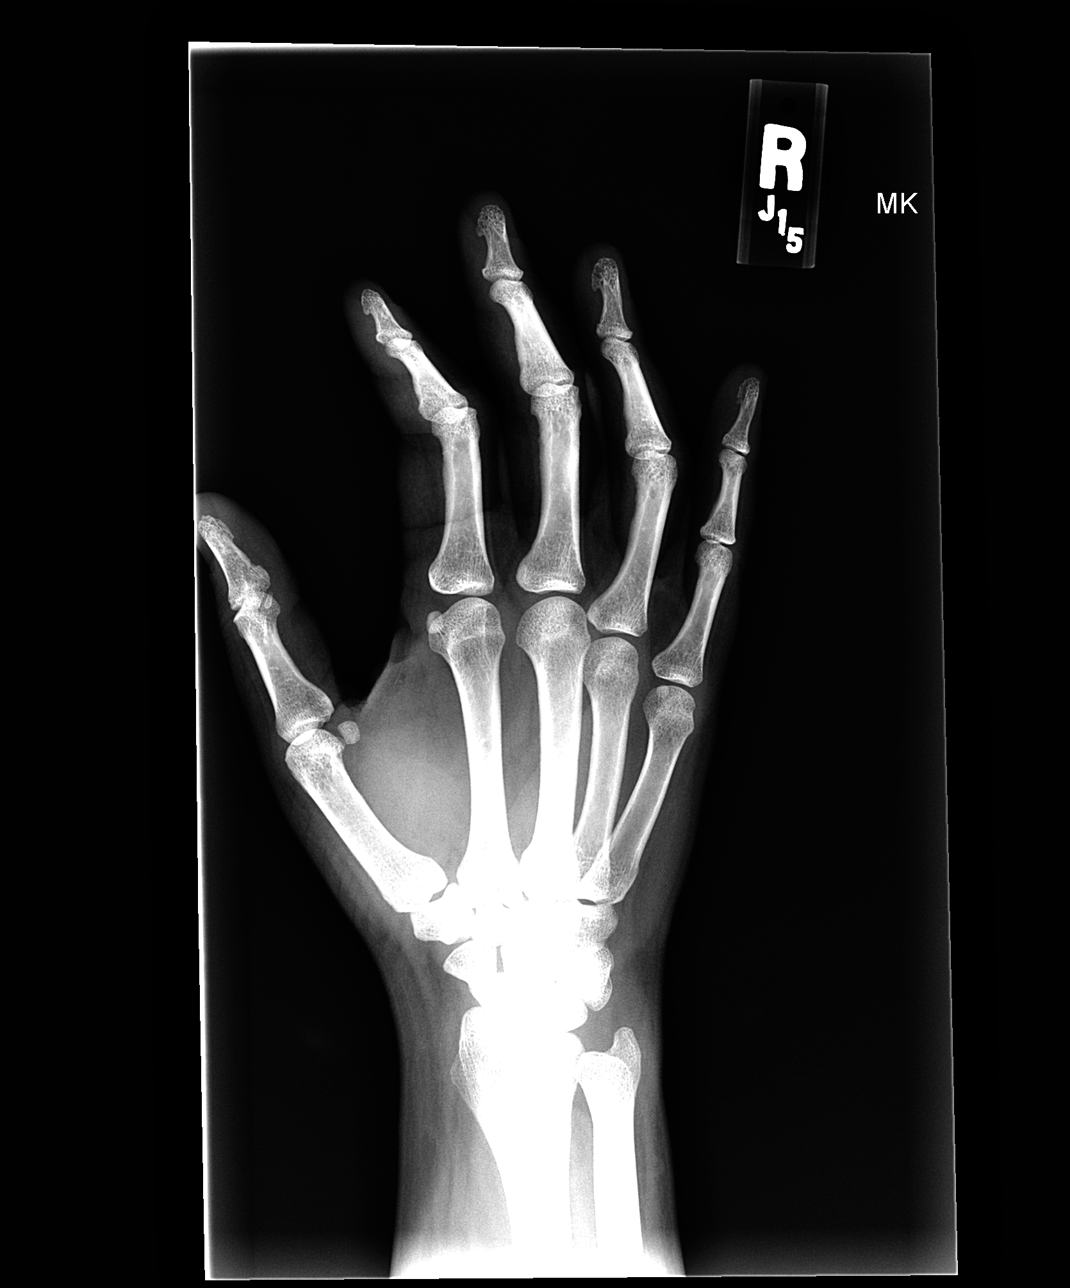

[view not recorded (3 of 3)]
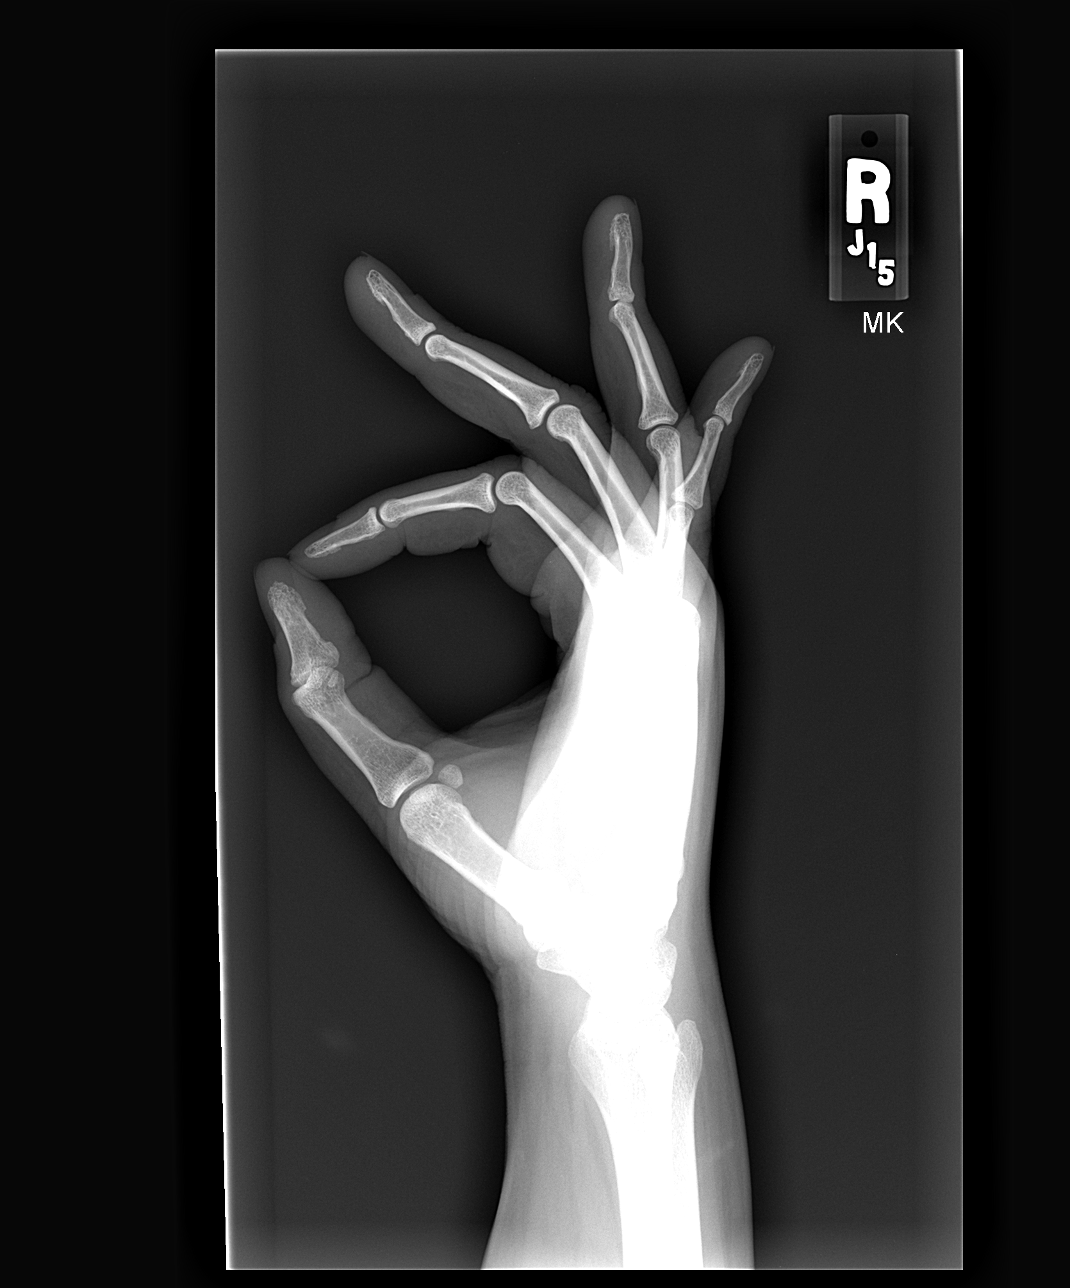

[3 of 3 positions shown; findings below may reference images not displayed]

FINDINGS: Joint spaces and alignment are maintained.  No erosions are identified.  Soft tissues appear normal.  There is no fracture or dislocation.  The patient has mild ulnar minus variance.
IMPRESSION: No acute finding.  Negative for inflammatory or degenerative arthropathy. 
RIGHT HAND ? 3 VIEW:
FINDINGS: Joint spaces and alignment are maintained.  No erosions are identified.  No soft tissue swelling is seen.  Mild ulnar minus variance is noted.
IMPRESSION: Negative for inflammatory or degenerative arthropathy.  No acute finding.

## 2005-07-05 ENCOUNTER — Ambulatory Visit: Payer: Self-pay | Admitting: Family Medicine

## 2005-07-06 ENCOUNTER — Encounter (INDEPENDENT_AMBULATORY_CARE_PROVIDER_SITE_OTHER): Payer: Self-pay | Admitting: *Deleted

## 2005-07-06 LAB — CONVERTED CEMR LAB

## 2005-07-26 ENCOUNTER — Ambulatory Visit: Payer: Self-pay | Admitting: Family Medicine

## 2005-07-26 ENCOUNTER — Other Ambulatory Visit: Admission: RE | Admit: 2005-07-26 | Discharge: 2005-07-26 | Payer: Self-pay | Admitting: Family Medicine

## 2005-11-02 ENCOUNTER — Ambulatory Visit: Payer: Self-pay | Admitting: Family Medicine

## 2005-12-26 ENCOUNTER — Ambulatory Visit: Payer: Self-pay | Admitting: Family Medicine

## 2006-04-02 ENCOUNTER — Ambulatory Visit: Payer: Self-pay | Admitting: Family Medicine

## 2006-04-04 DIAGNOSIS — D509 Iron deficiency anemia, unspecified: Secondary | ICD-10-CM

## 2006-04-04 DIAGNOSIS — E1159 Type 2 diabetes mellitus with other circulatory complications: Secondary | ICD-10-CM

## 2006-04-04 DIAGNOSIS — F3289 Other specified depressive episodes: Secondary | ICD-10-CM | POA: Insufficient documentation

## 2006-04-04 DIAGNOSIS — E669 Obesity, unspecified: Secondary | ICD-10-CM | POA: Insufficient documentation

## 2006-04-04 DIAGNOSIS — I10 Essential (primary) hypertension: Secondary | ICD-10-CM

## 2006-04-04 DIAGNOSIS — F329 Major depressive disorder, single episode, unspecified: Secondary | ICD-10-CM

## 2006-04-04 DIAGNOSIS — J309 Allergic rhinitis, unspecified: Secondary | ICD-10-CM | POA: Insufficient documentation

## 2006-04-04 DIAGNOSIS — I152 Hypertension secondary to endocrine disorders: Secondary | ICD-10-CM

## 2006-04-04 DIAGNOSIS — N920 Excessive and frequent menstruation with regular cycle: Secondary | ICD-10-CM

## 2006-04-04 HISTORY — DX: Iron deficiency anemia, unspecified: D50.9

## 2006-04-04 HISTORY — DX: Major depressive disorder, single episode, unspecified: F32.9

## 2006-04-04 HISTORY — DX: Allergic rhinitis, unspecified: J30.9

## 2006-04-04 HISTORY — DX: Other specified depressive episodes: F32.89

## 2006-04-04 HISTORY — DX: Excessive and frequent menstruation with regular cycle: N92.0

## 2006-04-04 HISTORY — DX: Type 2 diabetes mellitus with other circulatory complications: E11.59

## 2006-04-04 HISTORY — DX: Hypertension secondary to endocrine disorders: I15.2

## 2006-04-05 ENCOUNTER — Encounter (INDEPENDENT_AMBULATORY_CARE_PROVIDER_SITE_OTHER): Payer: Self-pay | Admitting: *Deleted

## 2006-05-29 ENCOUNTER — Telehealth (INDEPENDENT_AMBULATORY_CARE_PROVIDER_SITE_OTHER): Payer: Self-pay | Admitting: *Deleted

## 2006-05-31 ENCOUNTER — Telehealth (INDEPENDENT_AMBULATORY_CARE_PROVIDER_SITE_OTHER): Payer: Self-pay | Admitting: Family Medicine

## 2006-11-07 ENCOUNTER — Telehealth (INDEPENDENT_AMBULATORY_CARE_PROVIDER_SITE_OTHER): Payer: Self-pay | Admitting: Family Medicine

## 2006-11-28 ENCOUNTER — Telehealth (INDEPENDENT_AMBULATORY_CARE_PROVIDER_SITE_OTHER): Payer: Self-pay | Admitting: *Deleted

## 2006-12-19 ENCOUNTER — Ambulatory Visit: Payer: Self-pay | Admitting: Family Medicine

## 2006-12-19 ENCOUNTER — Encounter (INDEPENDENT_AMBULATORY_CARE_PROVIDER_SITE_OTHER): Payer: Self-pay | Admitting: Family Medicine

## 2006-12-19 LAB — CONVERTED CEMR LAB
Albumin: 3.9 g/dL (ref 3.5–5.2)
Alkaline Phosphatase: 86 units/L (ref 39–117)
Blood in Urine, dipstick: NEGATIVE
Calcium: 9.6 mg/dL (ref 8.4–10.5)
Glucose, Bld: 191 mg/dL — ABNORMAL HIGH (ref 70–99)
Glucose, Urine, Semiquant: NEGATIVE
Hemoglobin: 12.6 g/dL (ref 12.0–15.0)
Hgb A1c MFr Bld: 10 %
MCV: 85.1 fL (ref 78.0–100.0)
Platelets: 428 10*3/uL — ABNORMAL HIGH (ref 150–400)
Potassium: 4.2 meq/L (ref 3.5–5.3)
RBC: 4.62 M/uL (ref 3.87–5.11)
Specific Gravity, Urine: 1.025
Total Bilirubin: 0.5 mg/dL (ref 0.3–1.2)
WBC Urine, dipstick: NEGATIVE
WBC: 9.3 10*3/uL (ref 4.0–10.5)
pH: 6

## 2006-12-24 ENCOUNTER — Encounter (INDEPENDENT_AMBULATORY_CARE_PROVIDER_SITE_OTHER): Payer: Self-pay | Admitting: Family Medicine

## 2007-01-10 ENCOUNTER — Ambulatory Visit: Payer: Self-pay | Admitting: Family Medicine

## 2007-03-20 ENCOUNTER — Telehealth (INDEPENDENT_AMBULATORY_CARE_PROVIDER_SITE_OTHER): Payer: Self-pay | Admitting: Family Medicine

## 2007-03-27 ENCOUNTER — Ambulatory Visit: Payer: Self-pay | Admitting: Family Medicine

## 2007-04-26 ENCOUNTER — Emergency Department (HOSPITAL_COMMUNITY): Admission: EM | Admit: 2007-04-26 | Discharge: 2007-04-26 | Payer: Self-pay | Admitting: Emergency Medicine

## 2007-04-26 IMAGING — CR DG CHEST 2V
2 series · 2 of 2 positions shown · non-contrast
Comparison: [DATE].

CLINICAL DATA: Nausea, vomiting, and shortness of breath.
 CHEST - 2 VIEW: 
 PA and lateral chest - [DATE].

[w chest pa]
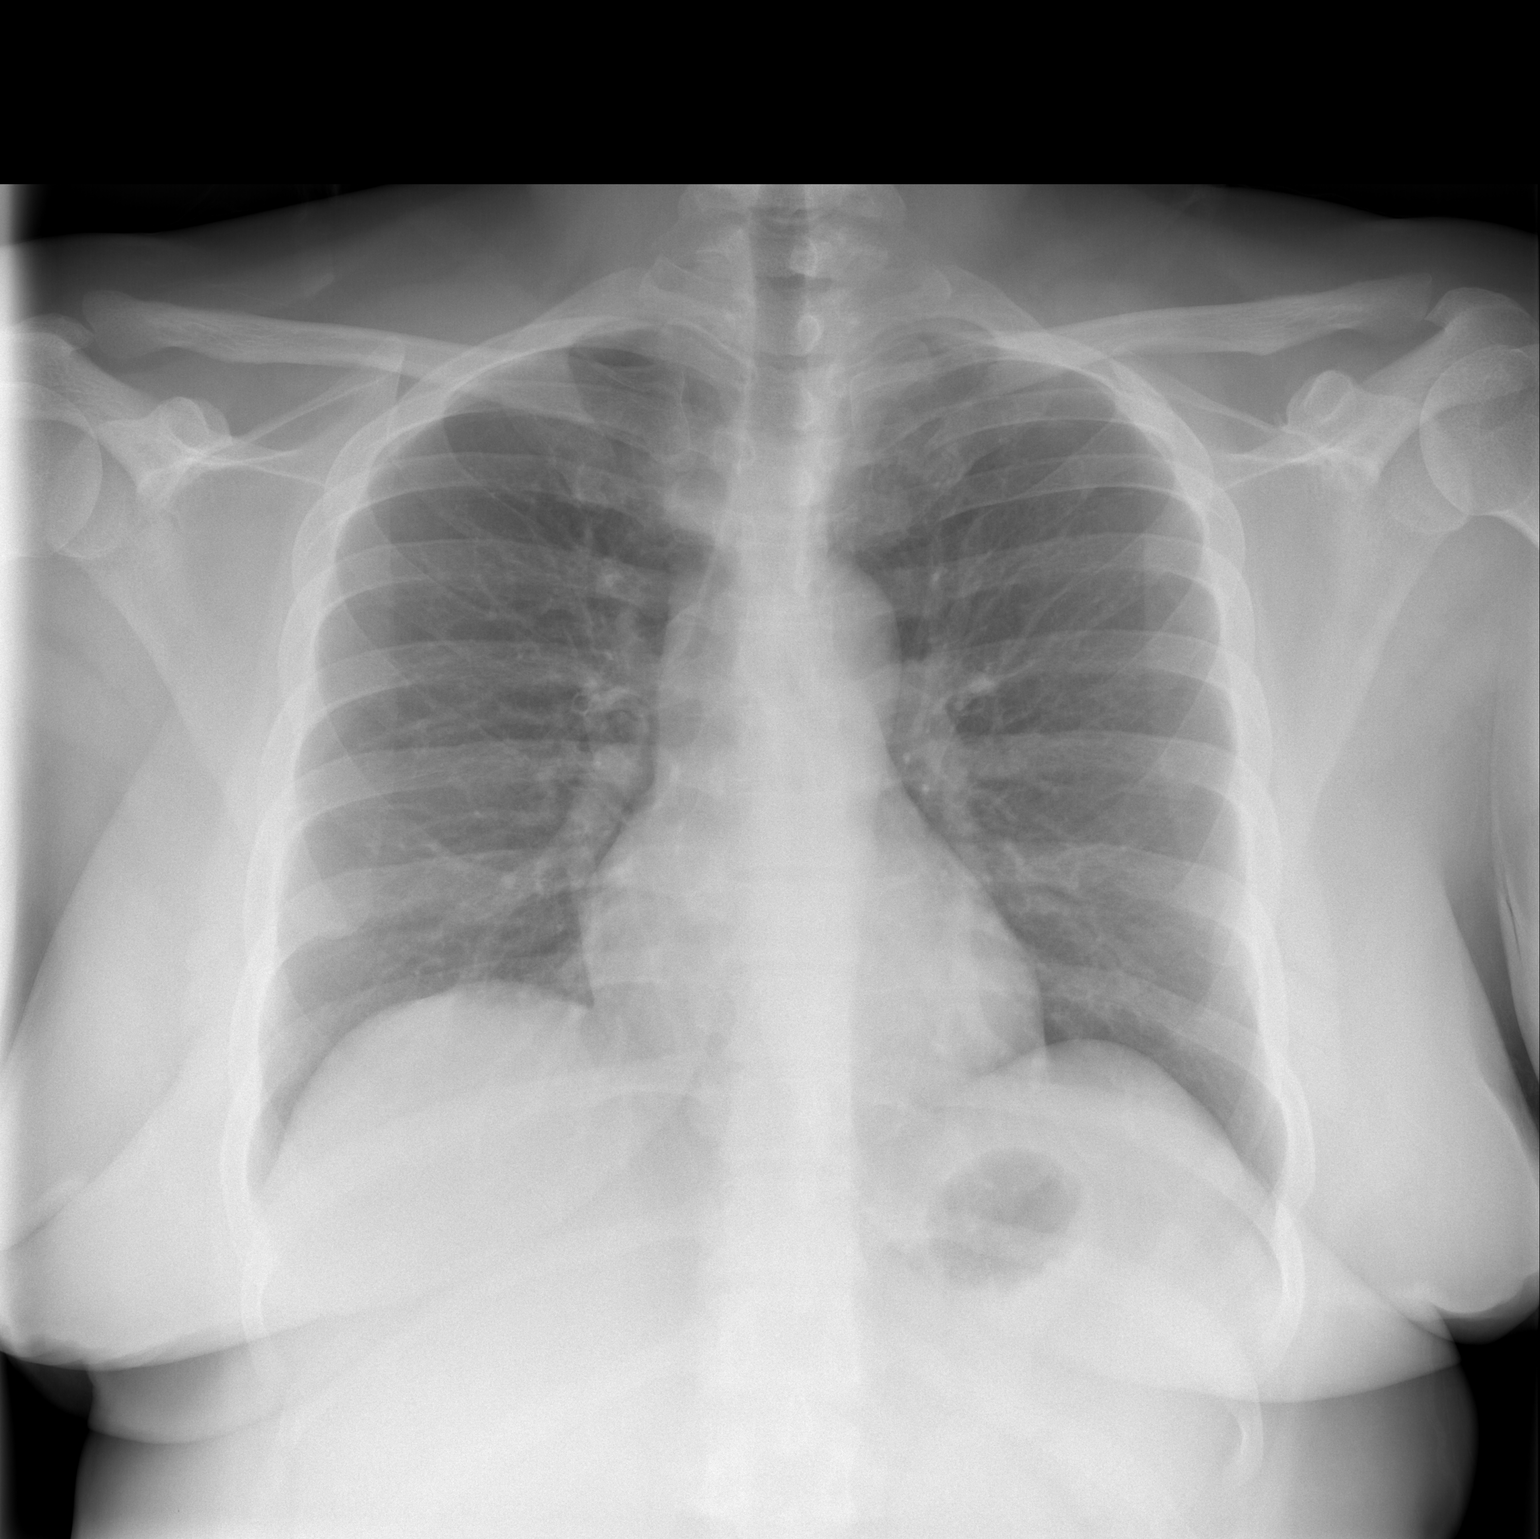

[w chest lat *]
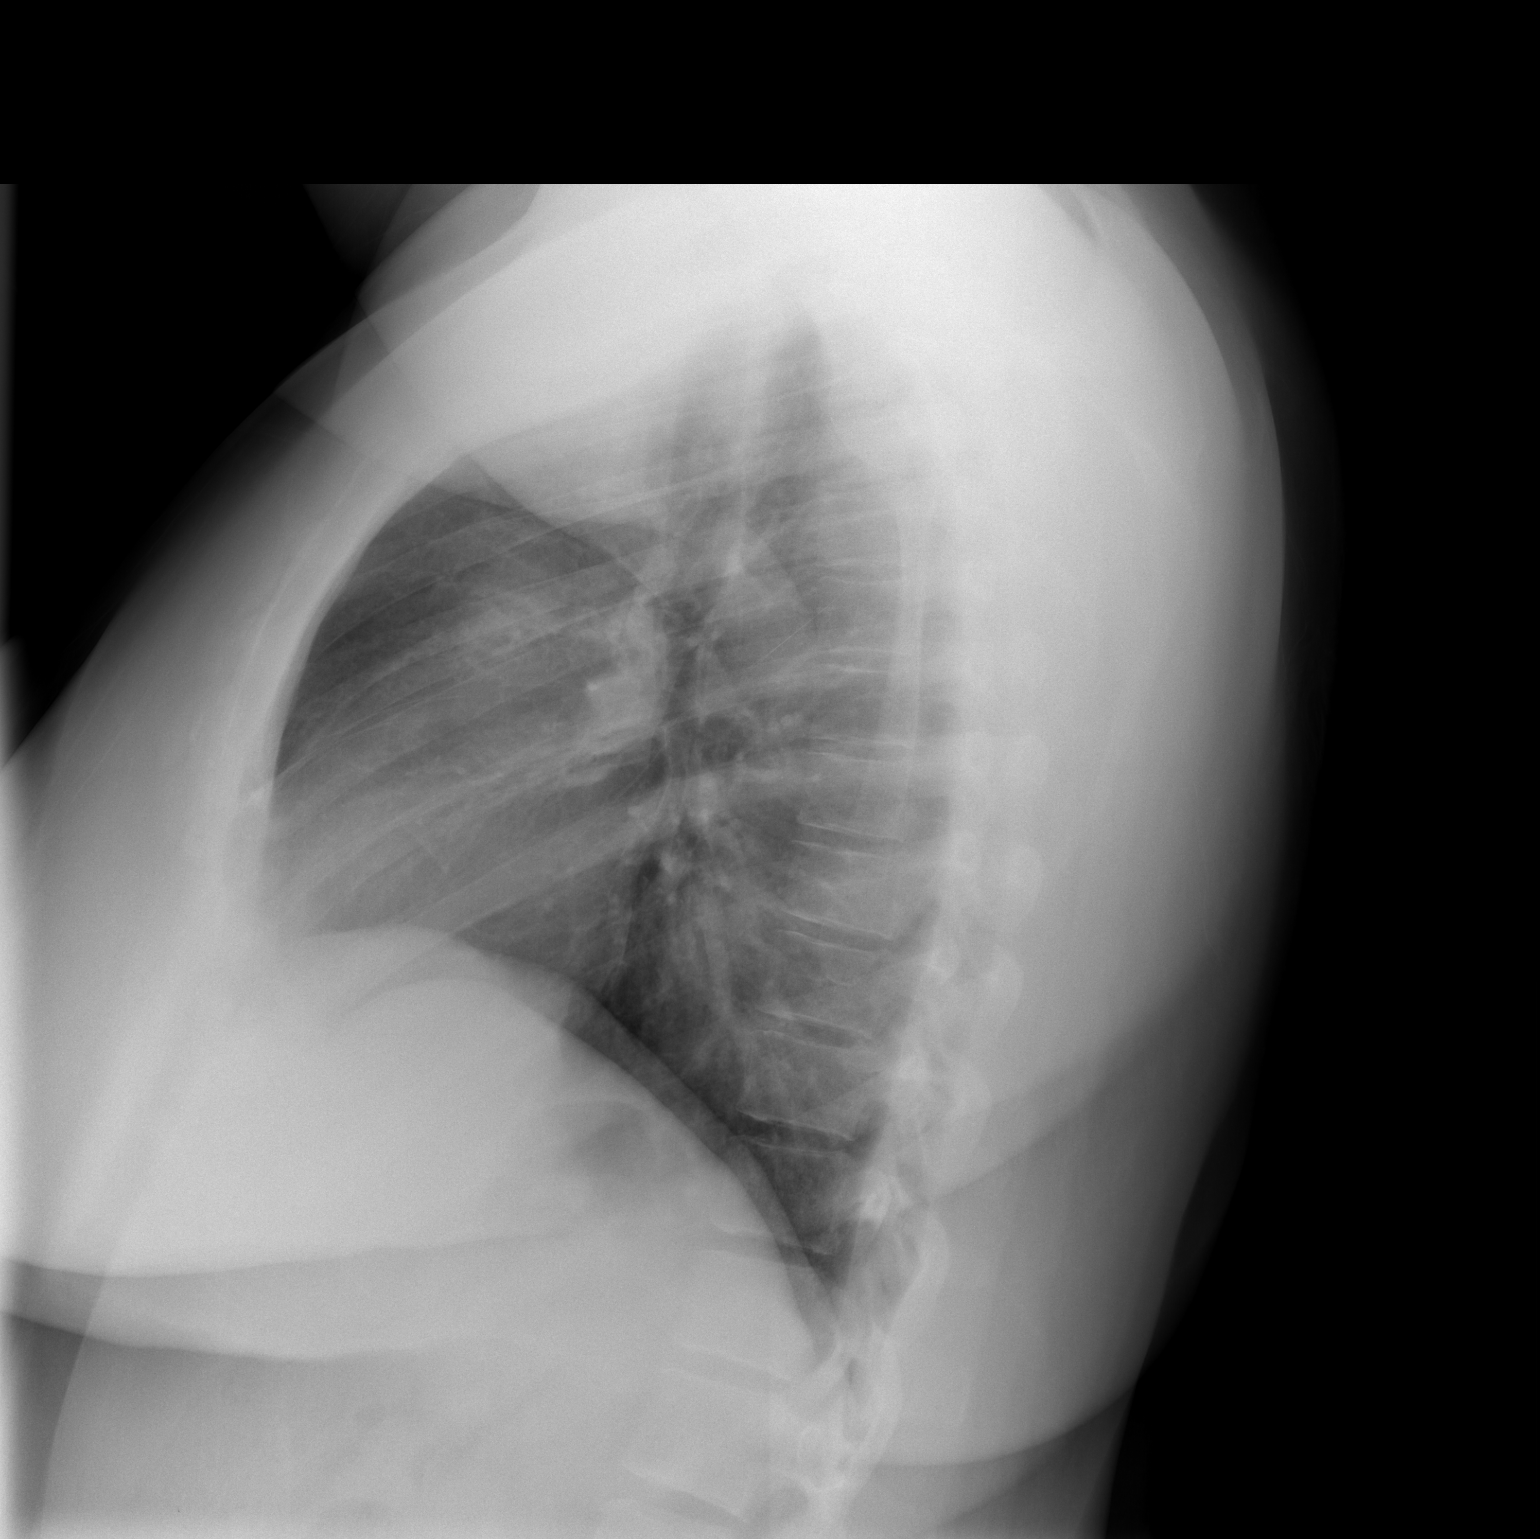

[2 of 2 positions shown; findings below may reference images not displayed]

FINDINGS: The lungs are clear.   The cardiopericardial silhouette is within normal limits.  The trachea is midline.   No air space disease or effusions.
IMPRESSION: No active cardiopulmonary disease.

## 2007-04-29 ENCOUNTER — Telehealth (INDEPENDENT_AMBULATORY_CARE_PROVIDER_SITE_OTHER): Payer: Self-pay | Admitting: *Deleted

## 2007-06-06 ENCOUNTER — Telehealth: Payer: Self-pay | Admitting: *Deleted

## 2007-10-09 ENCOUNTER — Encounter (INDEPENDENT_AMBULATORY_CARE_PROVIDER_SITE_OTHER): Payer: Self-pay | Admitting: *Deleted

## 2007-10-28 ENCOUNTER — Encounter (INDEPENDENT_AMBULATORY_CARE_PROVIDER_SITE_OTHER): Payer: Self-pay | Admitting: Family Medicine

## 2007-10-28 ENCOUNTER — Ambulatory Visit: Payer: Self-pay | Admitting: Family Medicine

## 2007-10-28 LAB — CONVERTED CEMR LAB
Beta hcg, urine, semiquantitative: NEGATIVE
Chlamydia, DNA Probe: NEGATIVE
Whiff Test: NEGATIVE

## 2007-10-31 ENCOUNTER — Encounter (INDEPENDENT_AMBULATORY_CARE_PROVIDER_SITE_OTHER): Payer: Self-pay | Admitting: Family Medicine

## 2007-11-17 ENCOUNTER — Ambulatory Visit: Payer: Self-pay | Admitting: Family Medicine

## 2007-11-19 ENCOUNTER — Ambulatory Visit: Payer: Self-pay | Admitting: Family Medicine

## 2007-11-25 ENCOUNTER — Telehealth: Payer: Self-pay | Admitting: *Deleted

## 2007-12-09 ENCOUNTER — Telehealth (INDEPENDENT_AMBULATORY_CARE_PROVIDER_SITE_OTHER): Payer: Self-pay | Admitting: Family Medicine

## 2008-03-05 ENCOUNTER — Ambulatory Visit: Payer: Self-pay | Admitting: Family Medicine

## 2008-03-05 ENCOUNTER — Encounter: Payer: Self-pay | Admitting: Family Medicine

## 2008-03-05 LAB — CONVERTED CEMR LAB
Blood in Urine, dipstick: NEGATIVE
Ketones, urine, test strip: NEGATIVE
Nitrite: NEGATIVE
Protein, U semiquant: 30
Specific Gravity, Urine: 1.025
Urobilinogen, UA: 0.2
WBC Urine, dipstick: NEGATIVE

## 2008-03-15 ENCOUNTER — Encounter (INDEPENDENT_AMBULATORY_CARE_PROVIDER_SITE_OTHER): Payer: Self-pay | Admitting: Family Medicine

## 2008-04-29 ENCOUNTER — Telehealth (INDEPENDENT_AMBULATORY_CARE_PROVIDER_SITE_OTHER): Payer: Self-pay | Admitting: *Deleted

## 2008-05-02 ENCOUNTER — Emergency Department (HOSPITAL_COMMUNITY): Admission: EM | Admit: 2008-05-02 | Discharge: 2008-05-02 | Payer: Self-pay | Admitting: *Deleted

## 2008-05-02 IMAGING — CR DG CHEST 2V
2 series · 2 of 2 positions shown · non-contrast
Comparison: [DATE]

CLINICAL DATA: Chest painshortness of breath.

CHEST - 2 VIEW

[w chest pa]
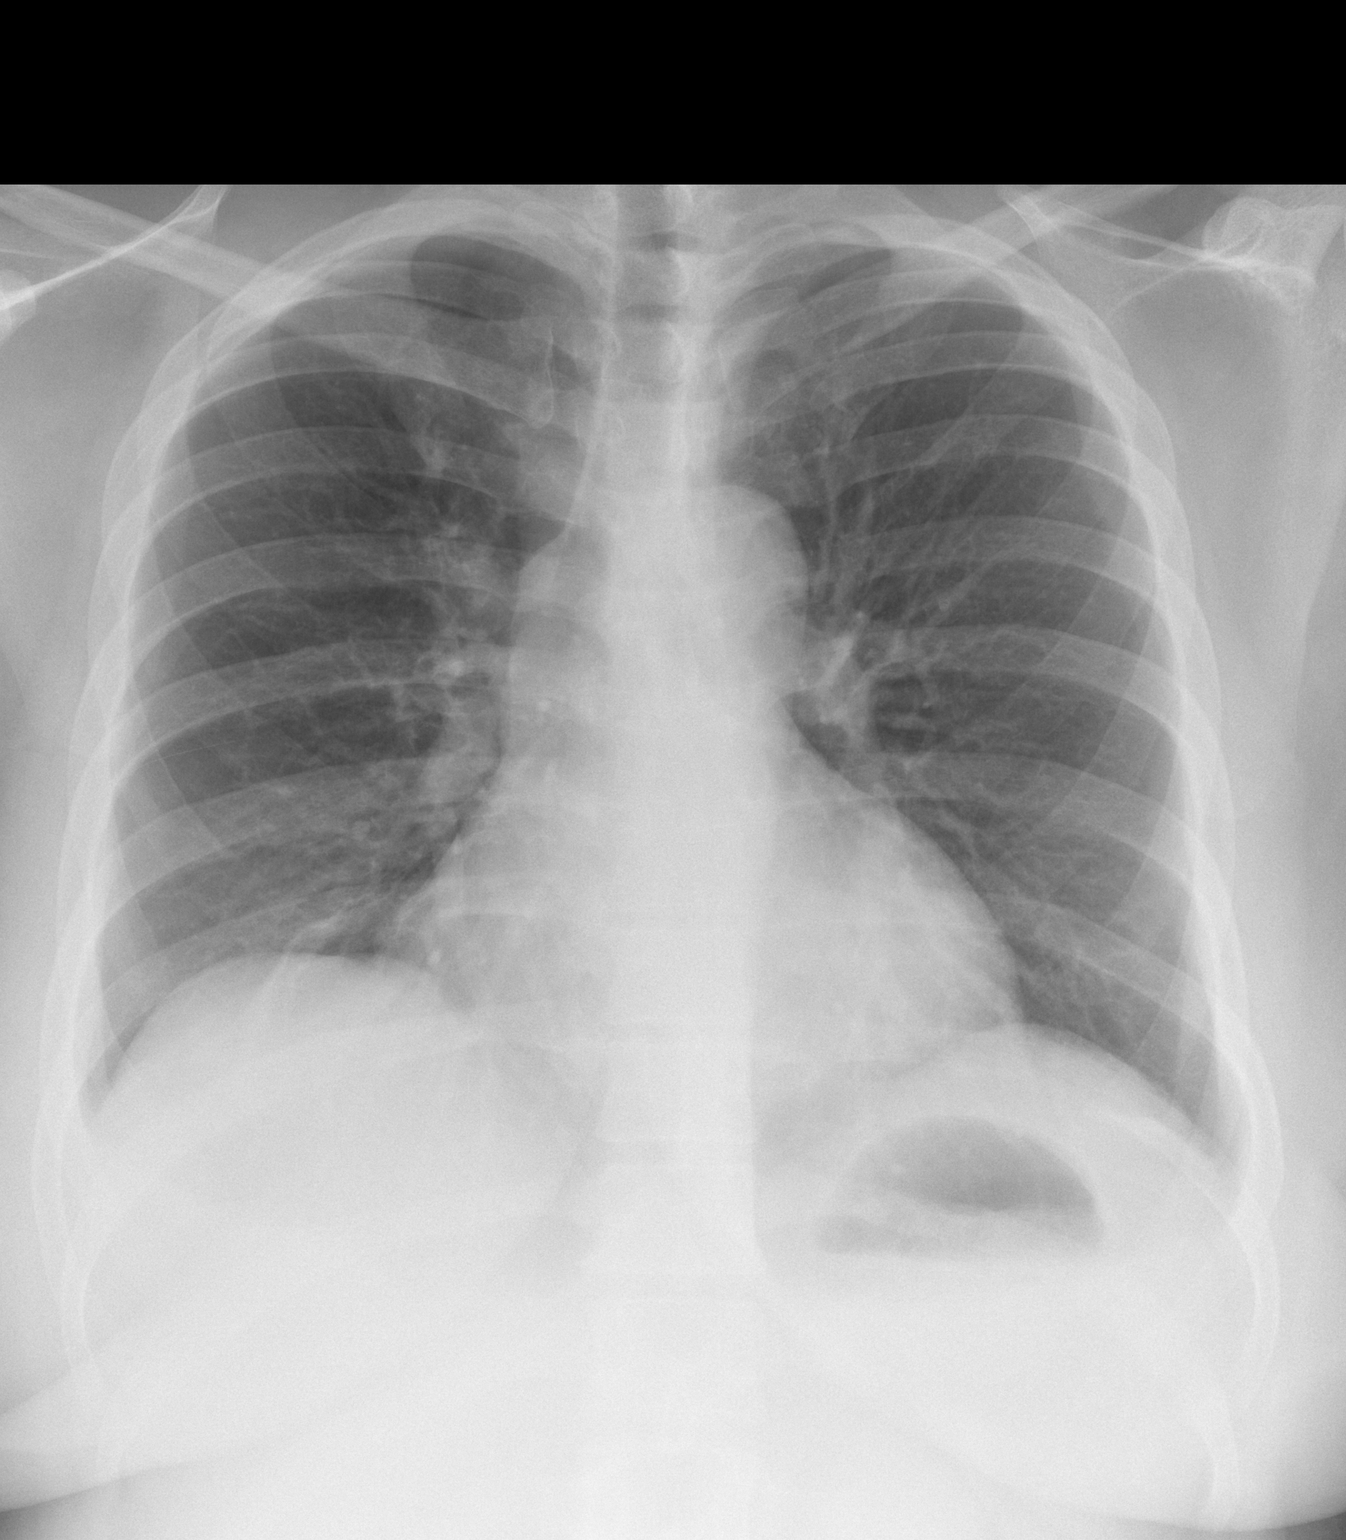

[w chest lat]
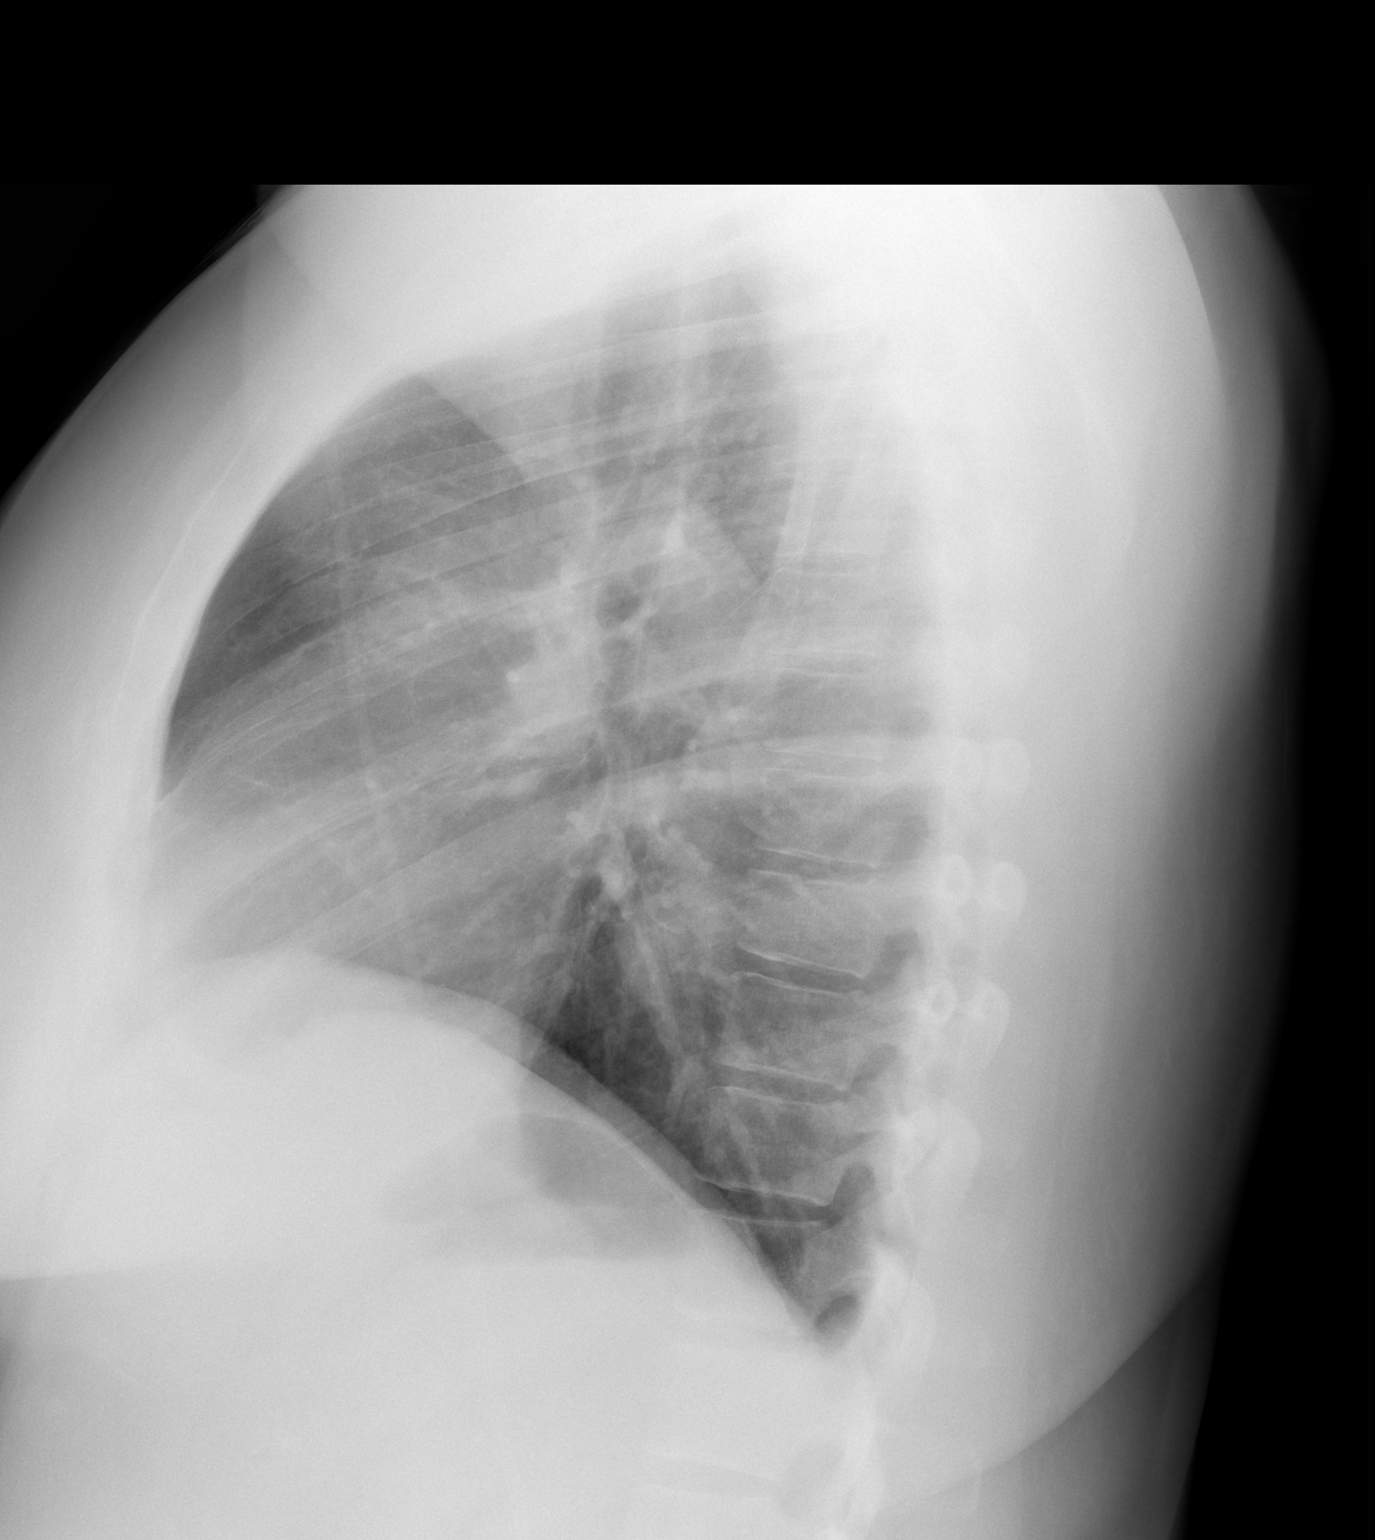

[2 of 2 positions shown; findings below may reference images not displayed]

FINDINGS: Midline trachea. Heart size upper limits of normal. No
pleural effusion or pneumothorax. No congestive failure.  Clear
lungs.
IMPRESSION: 1. No acute cardiopulmonary disease.
2.  Borderline cardiomegaly.

## 2008-11-22 ENCOUNTER — Telehealth: Payer: Self-pay | Admitting: Family Medicine

## 2008-11-29 ENCOUNTER — Encounter: Payer: Self-pay | Admitting: Family Medicine

## 2008-11-29 ENCOUNTER — Encounter (INDEPENDENT_AMBULATORY_CARE_PROVIDER_SITE_OTHER): Payer: Self-pay | Admitting: *Deleted

## 2008-11-29 ENCOUNTER — Ambulatory Visit: Payer: Self-pay | Admitting: Family Medicine

## 2008-11-29 DIAGNOSIS — R109 Unspecified abdominal pain: Secondary | ICD-10-CM | POA: Insufficient documentation

## 2008-11-29 LAB — CONVERTED CEMR LAB
Blood in Urine, dipstick: NEGATIVE
Hgb A1c MFr Bld: >14 %
Ketones, urine, test strip: NEGATIVE
Protein, U semiquant: NEGATIVE
Specific Gravity, Urine: 1.02
WBC Urine, dipstick: NEGATIVE

## 2008-11-30 ENCOUNTER — Telehealth: Payer: Self-pay | Admitting: *Deleted

## 2008-12-01 DIAGNOSIS — E1169 Type 2 diabetes mellitus with other specified complication: Secondary | ICD-10-CM

## 2008-12-01 DIAGNOSIS — E785 Hyperlipidemia, unspecified: Secondary | ICD-10-CM

## 2008-12-01 DIAGNOSIS — E876 Hypokalemia: Secondary | ICD-10-CM | POA: Insufficient documentation

## 2008-12-01 DIAGNOSIS — E782 Mixed hyperlipidemia: Secondary | ICD-10-CM

## 2008-12-01 HISTORY — DX: Type 2 diabetes mellitus with other specified complication: E78.2

## 2008-12-01 HISTORY — DX: Type 2 diabetes mellitus with other specified complication: E11.69

## 2008-12-01 LAB — CONVERTED CEMR LAB
AST: 11 units/L (ref 0–37)
Alkaline Phosphatase: 104 units/L (ref 39–117)
Basophils Absolute: 0.1 10*3/uL (ref 0.0–0.1)
CO2: 28 meq/L (ref 19–32)
Chloride: 97 meq/L (ref 96–112)
Cholesterol: 252 mg/dL — ABNORMAL HIGH (ref 0–200)
Glucose, Bld: 341 mg/dL — ABNORMAL HIGH (ref 70–99)
Hemoglobin: 13.3 g/dL (ref 12.0–15.0)
MCHC: 32.7 g/dL (ref 30.0–36.0)
Monocytes Relative: 5 % (ref 3–12)
Neutro Abs: 5.2 10*3/uL (ref 1.7–7.7)
RBC: 4.81 M/uL (ref 3.87–5.11)
RDW: 13.5 % (ref 11.5–15.5)
Sodium: 136 meq/L (ref 135–145)
Total Protein: 8 g/dL (ref 6.0–8.3)
Triglycerides: 192 mg/dL — ABNORMAL HIGH (ref <150)

## 2008-12-08 ENCOUNTER — Ambulatory Visit: Payer: Self-pay | Admitting: Family Medicine

## 2008-12-10 ENCOUNTER — Telehealth: Payer: Self-pay | Admitting: *Deleted

## 2009-01-14 ENCOUNTER — Telehealth: Payer: Self-pay | Admitting: Family Medicine

## 2009-01-17 ENCOUNTER — Telehealth: Payer: Self-pay | Admitting: Family Medicine

## 2009-03-03 ENCOUNTER — Telehealth (INDEPENDENT_AMBULATORY_CARE_PROVIDER_SITE_OTHER): Payer: Self-pay | Admitting: *Deleted

## 2009-04-11 ENCOUNTER — Encounter: Payer: Self-pay | Admitting: Family Medicine

## 2009-04-11 ENCOUNTER — Ambulatory Visit: Payer: Self-pay | Admitting: Family Medicine

## 2009-04-11 LAB — CONVERTED CEMR LAB
Bilirubin Urine: NEGATIVE
Hgb A1c MFr Bld: 10 %
Ketones, urine, test strip: NEGATIVE
Nitrite: NEGATIVE
Specific Gravity, Urine: >=1.03
WBC Urine, dipstick: NEGATIVE

## 2009-04-13 ENCOUNTER — Encounter: Payer: Self-pay | Admitting: Family Medicine

## 2009-04-13 ENCOUNTER — Ambulatory Visit (HOSPITAL_COMMUNITY): Admission: RE | Admit: 2009-04-13 | Discharge: 2009-04-13 | Payer: Self-pay | Admitting: Family Medicine

## 2009-04-13 ENCOUNTER — Telehealth (INDEPENDENT_AMBULATORY_CARE_PROVIDER_SITE_OTHER): Payer: Self-pay | Admitting: *Deleted

## 2009-04-13 LAB — CONVERTED CEMR LAB
ALT: <8 units/L (ref 0–35)
AST: 12 units/L (ref 0–37)
Alkaline Phosphatase: 81 units/L (ref 39–117)
Calcium: 9 mg/dL (ref 8.4–10.5)
Chloride: 106 meq/L (ref 96–112)
Cholesterol: 128 mg/dL (ref 0–200)
Creatinine, Ser: 0.79 mg/dL (ref 0.40–1.20)
LDL Cholesterol: 63 mg/dL (ref 0–99)
Sodium: 143 meq/L (ref 135–145)
Total CHOL/HDL Ratio: 3.1
Total Protein: 7 g/dL (ref 6.0–8.3)
Triglycerides: 119 mg/dL (ref <150)

## 2009-04-13 IMAGING — US US PELVIS COMPLETE MODIFY
1 series · 14 of 25 positions shown · non-contrast
Comparison: No similar prior study is available for comparison.
Prior images are not digitized.

CLINICAL DATA: Right lower quadrant pelvic and abdominal pain.

TRANSABDOMINAL AND TRANSVAGINAL ULTRASOUND OF PELVIS
TECHNIQUE: Both transabdominal and transvaginal ultrasound
examinations of the pelvis were performed including evaluation of
the uterus, ovaries, adnexal regions, and pelvic cul-de-sac.

[Series 1: us transvaginal non-ob · 0.27mm/px · 67 acquisitions, 14 frames shown]
[im 1/67]
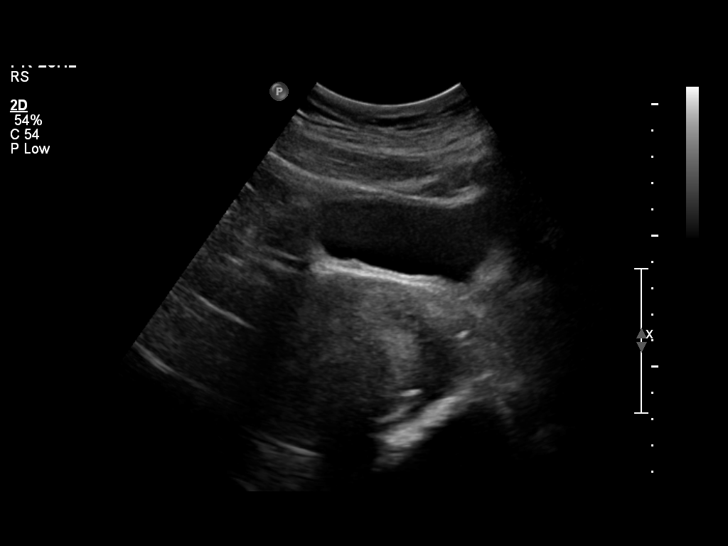
[im 6/67]
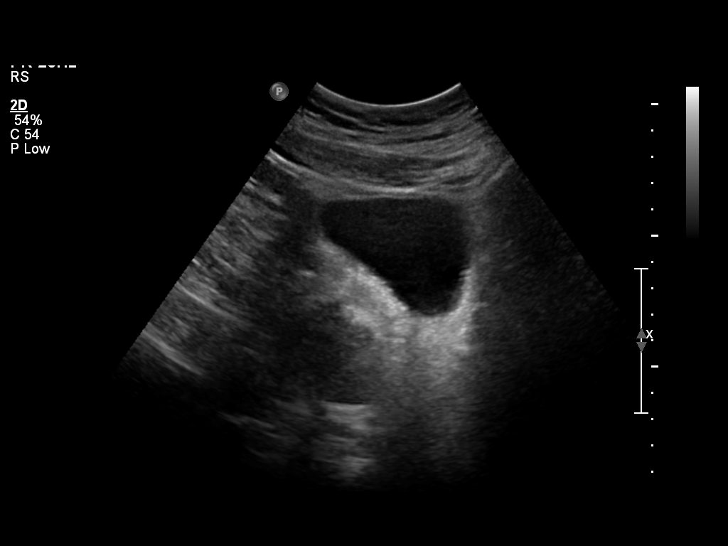
[im 12/67]
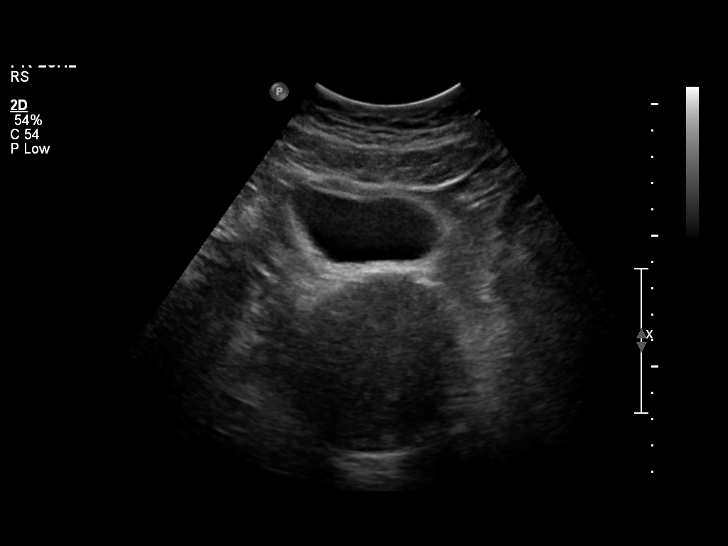
[im 17/67]
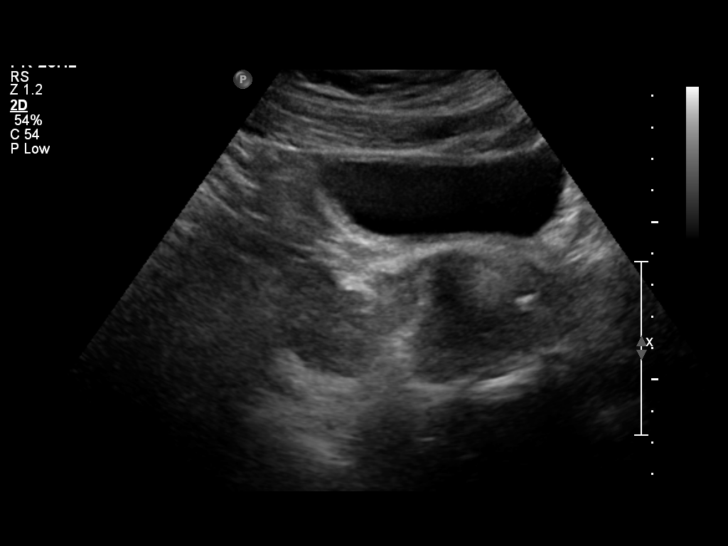
[im 23/67]
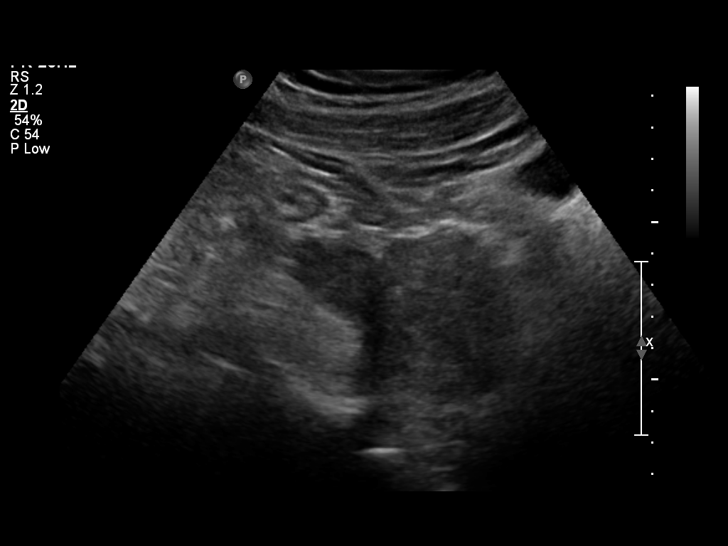
[im 25/67]
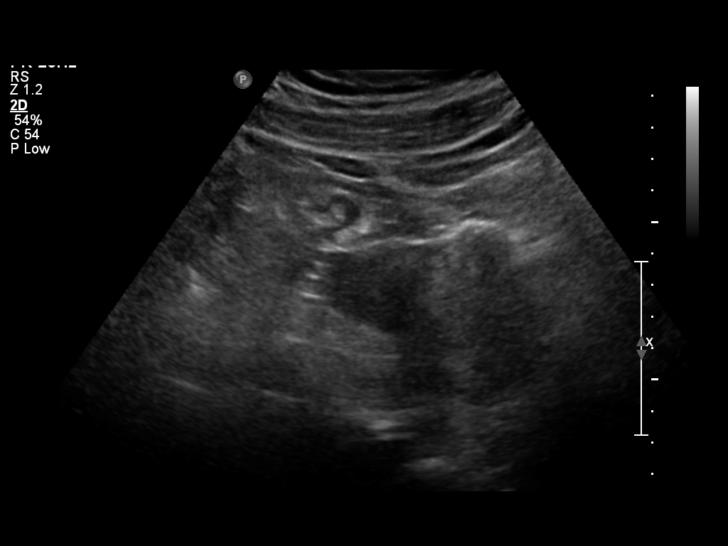
[im 31/67]
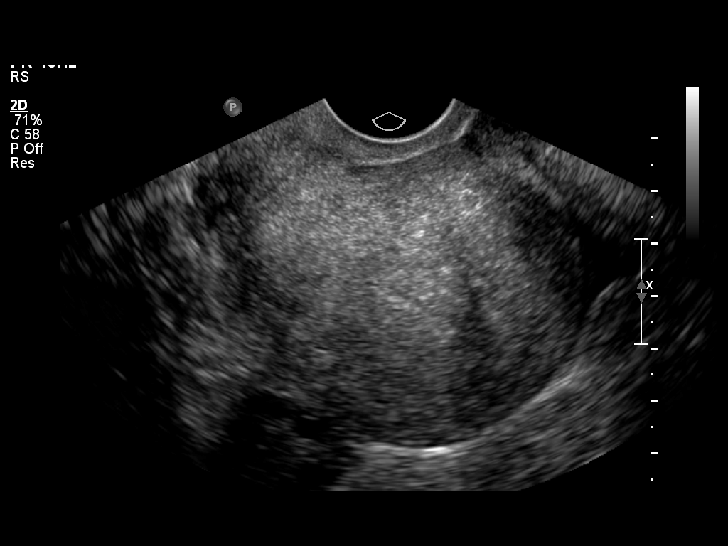
[im 36/67]
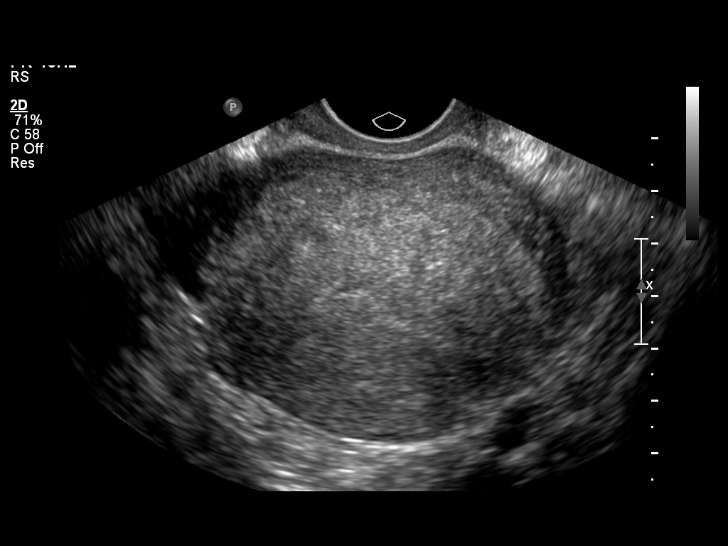
[im 42/67]
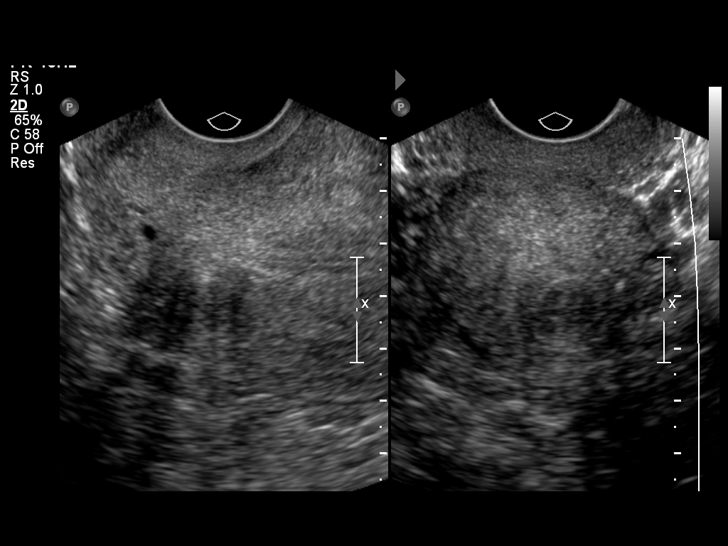
[im 45/67]
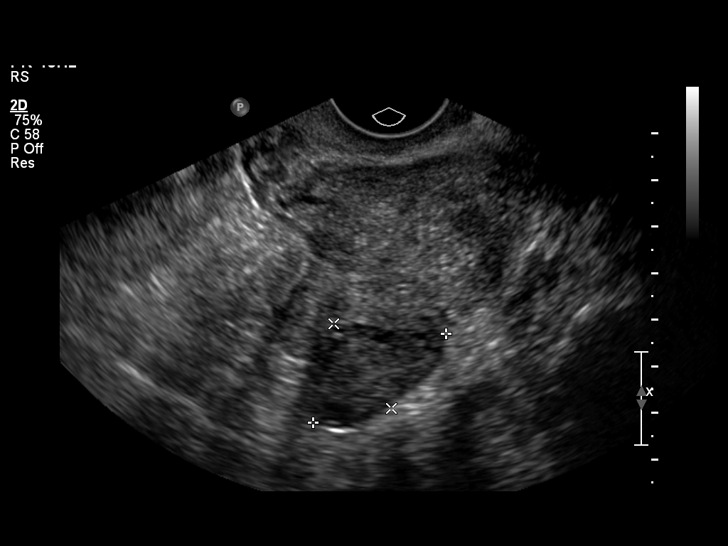
[im 50/67]
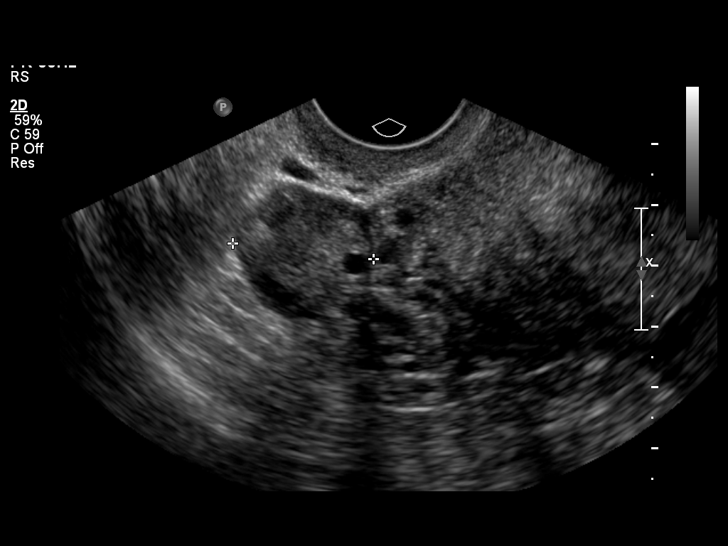
[im 56/67]
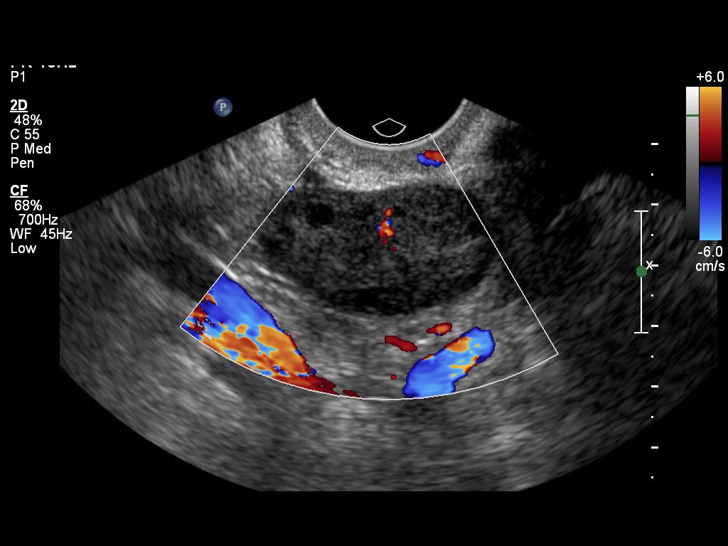
[im 61/67]
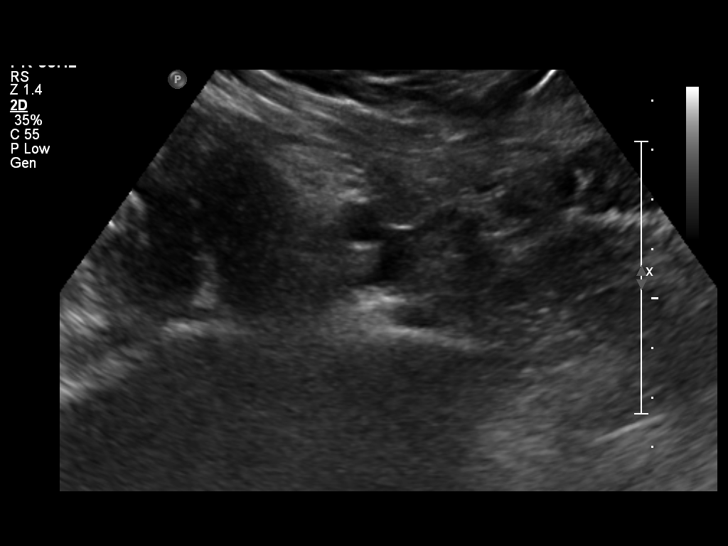
[im 67/67]
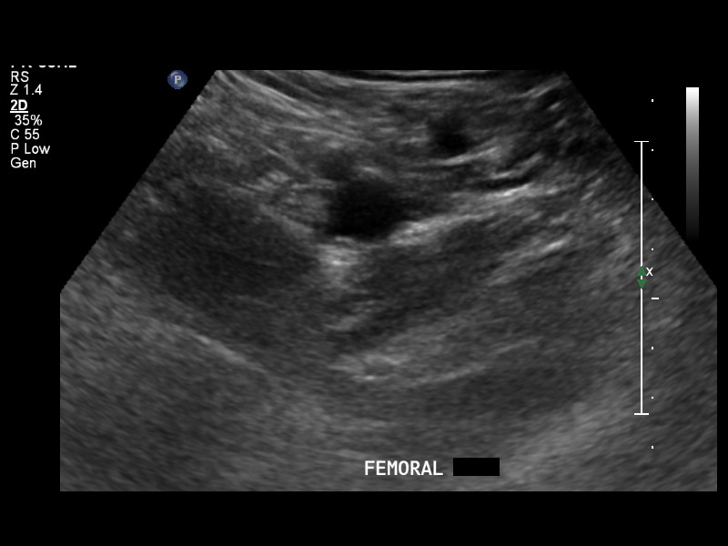

[14 of 25 positions shown; findings below may reference images not displayed]

FINDINGS: Uterus measures 9.5 x 6.7 x 6.5 cm.  Retroverted, retroflexed.
Fibroids are noted as follows:

Right posterolateral uterine body, predominately intramural, 2.1 x
2.0 x 1.9 cm

Left posterolateral uterine body, predominately intramural, 2.2 x
2.1 x 1.9 cm

Anterior uterine midbody, predominately intramural, 2.3 x 1.7 x
cm

Endometrium 7 mm, uniformly echogenic.

Right Ovary 4.1 x 2.3 x 2.2 cm.  Normal.

Left Ovary 3.4 x 2.6 x 2.2 cm.  Normal.

Other Findings:  Small amount of free fluid is noted in the cul-de-
sac.
IMPRESSION: Fibroid uterus.  Otherwise normal exam.

## 2009-04-15 ENCOUNTER — Telehealth: Payer: Self-pay | Admitting: Family Medicine

## 2009-04-18 ENCOUNTER — Encounter: Payer: Self-pay | Admitting: Family Medicine

## 2009-05-19 ENCOUNTER — Ambulatory Visit: Payer: Self-pay | Admitting: Family Medicine

## 2009-06-14 ENCOUNTER — Encounter: Payer: Self-pay | Admitting: Family Medicine

## 2009-06-21 ENCOUNTER — Ambulatory Visit: Payer: Self-pay | Admitting: Family Medicine

## 2009-07-22 ENCOUNTER — Ambulatory Visit: Payer: Self-pay | Admitting: Family Medicine

## 2009-07-22 DIAGNOSIS — N63 Unspecified lump in unspecified breast: Secondary | ICD-10-CM

## 2009-07-22 HISTORY — DX: Unspecified lump in unspecified breast: N63.0

## 2009-07-25 ENCOUNTER — Encounter: Admission: RE | Admit: 2009-07-25 | Discharge: 2009-07-25 | Payer: Self-pay | Admitting: Family Medicine

## 2009-07-25 ENCOUNTER — Encounter (INDEPENDENT_AMBULATORY_CARE_PROVIDER_SITE_OTHER): Payer: Self-pay | Admitting: *Deleted

## 2009-07-25 IMAGING — MG MM DIGITAL DIAGNOSTIC BILAT
5 series · 5 of 5 positions shown · non-contrast
Comparison: None.

CLINICAL DATA: The patient notes palpable thickening within the
lateral right breast with tenderness. The patient has undergone
previous breast reduction surgery

DIGITAL DIAGNOSTIC BILATERAL MAMMOGRAM WITH CAD AND RIGHT BREAST
ULTRASOUND:

[R CC]
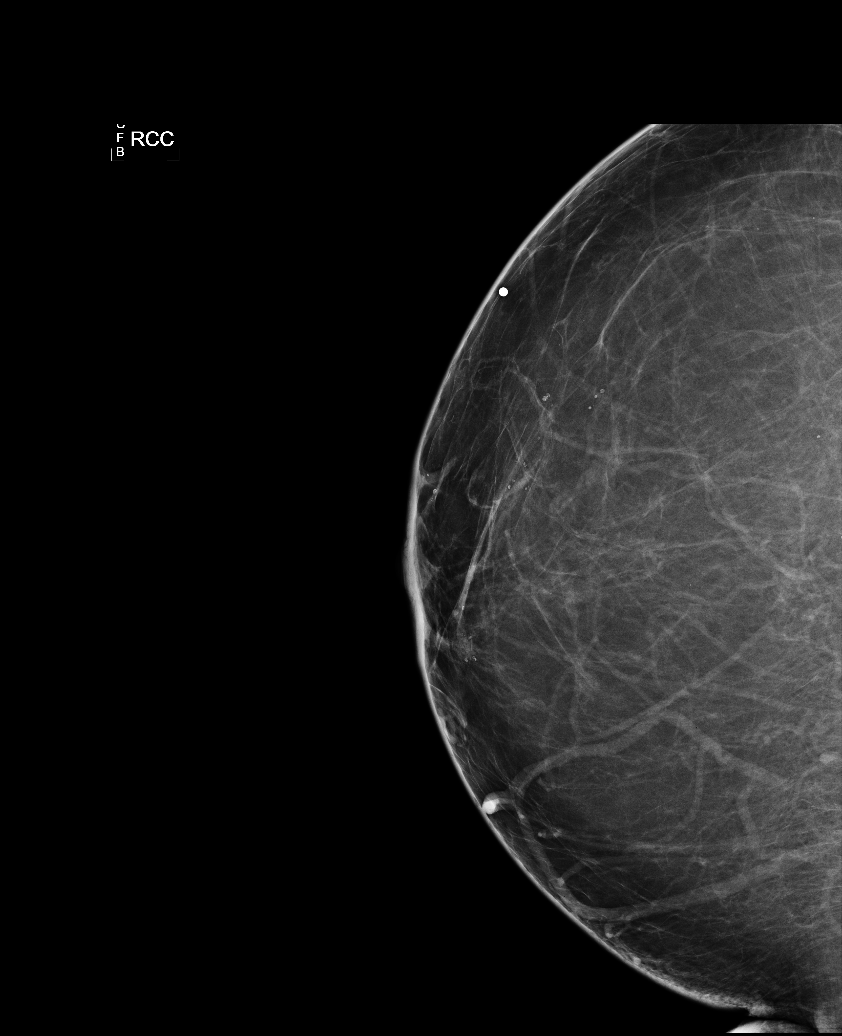

[L CC]
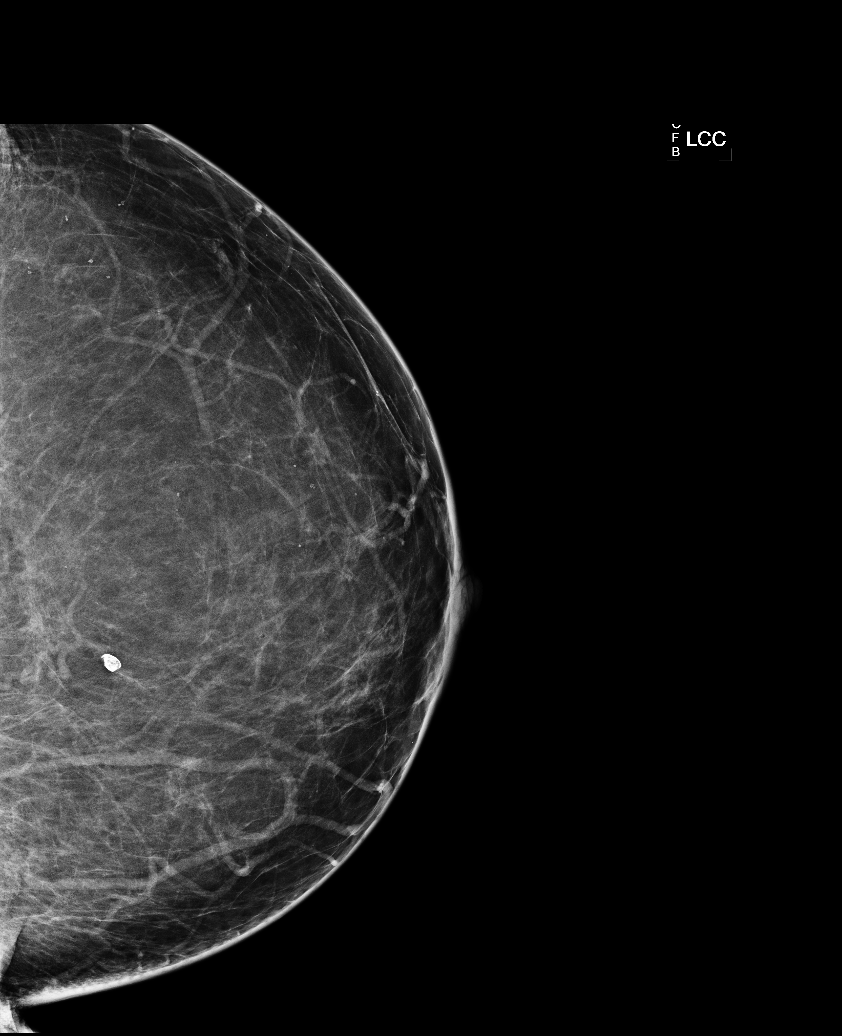

[L MLO]
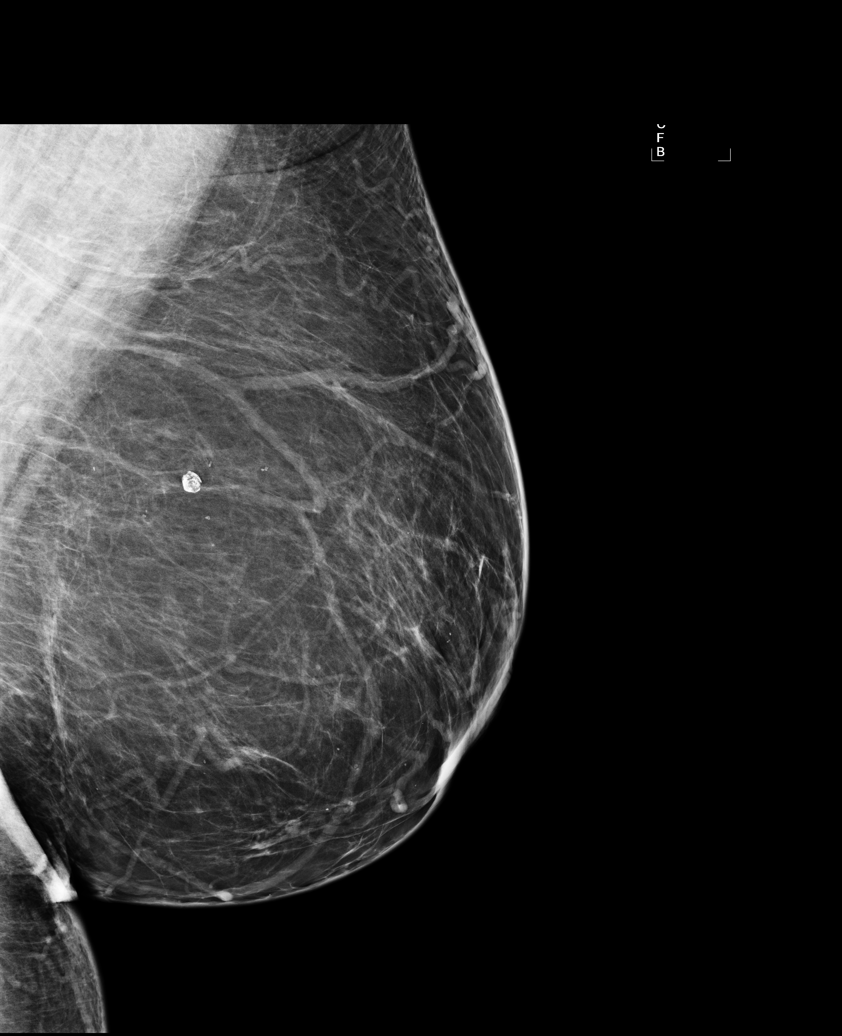

[R MLO]
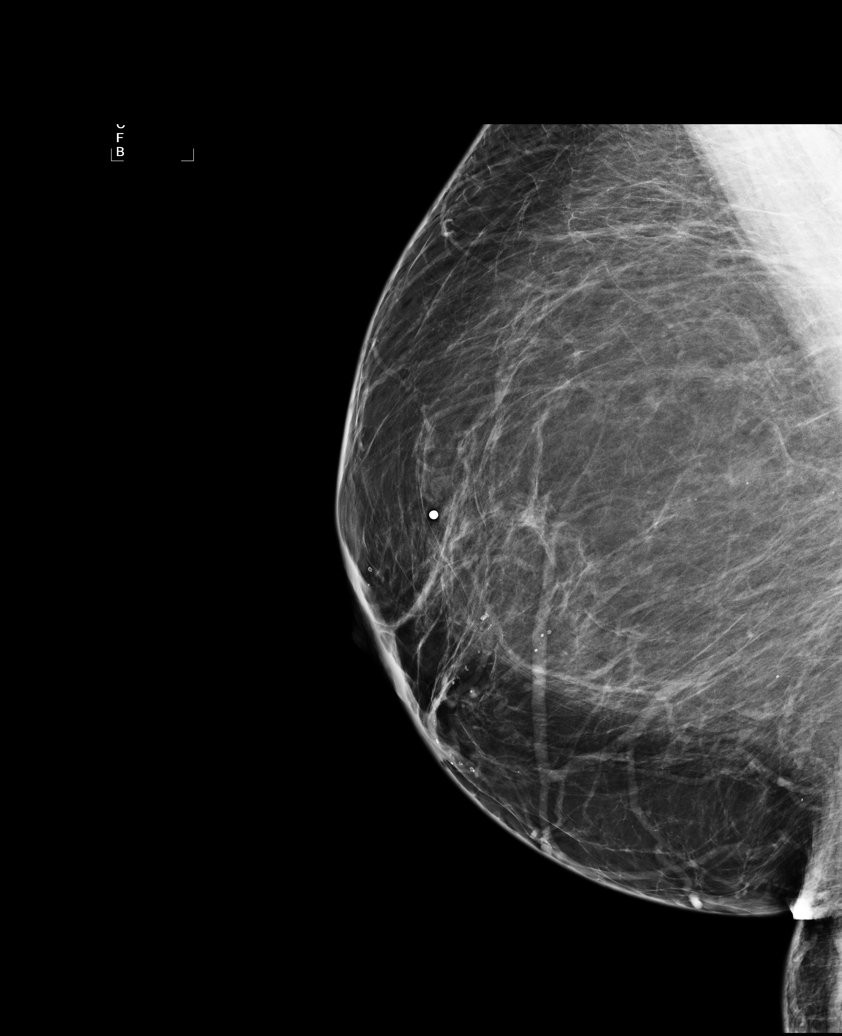

[R TAN]
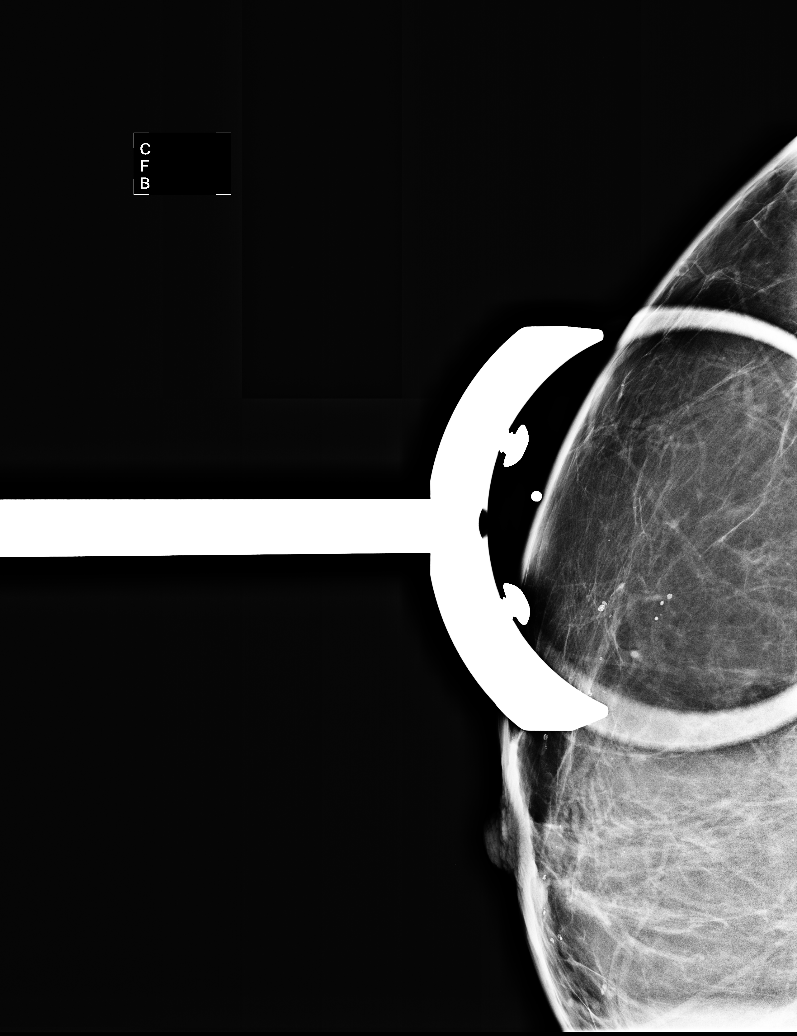

[5 of 5 positions shown; findings below may reference images not displayed]

FINDINGS: There is a fibrofatty parenchymal pattern present. There
is no mass, worrisome distortion, or worrisome calcification.  In
the area of palpable thickening there is predominately fatty
tissue.
Mammographic images were processed with CAD.

On physical exam, there is thickening within the lateral right
breast without a discrete palpable mass.

Ultrasound is performed, showing fatty tissue within the lateral
right breast.  There is no mass, distortion, or worrisome
shadowing.
IMPRESSION: The area of thickening located laterally within the right breast
corresponds to fatty tissue.  The patient has undergone previous
breast reduction surgery.  There are no findings worrisome for
malignancy.  Recommend annual screening mammography.

BI-RADS CATEGORY 1:  Negative.

## 2009-07-27 ENCOUNTER — Telehealth: Payer: Self-pay | Admitting: Family Medicine

## 2009-08-26 ENCOUNTER — Telehealth: Payer: Self-pay | Admitting: *Deleted

## 2009-09-02 ENCOUNTER — Telehealth: Payer: Self-pay | Admitting: Family Medicine

## 2009-09-02 ENCOUNTER — Ambulatory Visit: Payer: Self-pay | Admitting: Family Medicine

## 2009-09-05 ENCOUNTER — Ambulatory Visit: Payer: Self-pay | Admitting: Family Medicine

## 2009-09-05 ENCOUNTER — Telehealth: Payer: Self-pay | Admitting: Family Medicine

## 2009-09-08 ENCOUNTER — Encounter: Admission: RE | Admit: 2009-09-08 | Discharge: 2009-09-08 | Payer: Self-pay | Admitting: Infectious Diseases

## 2009-09-08 IMAGING — CR DG CHEST 1V
1 series · 1 of 1 positions shown · non-contrast
Comparison: [DATE]

CLINICAL DATA: Positive PPD.

CHEST - 1 VIEW

[view not recorded]
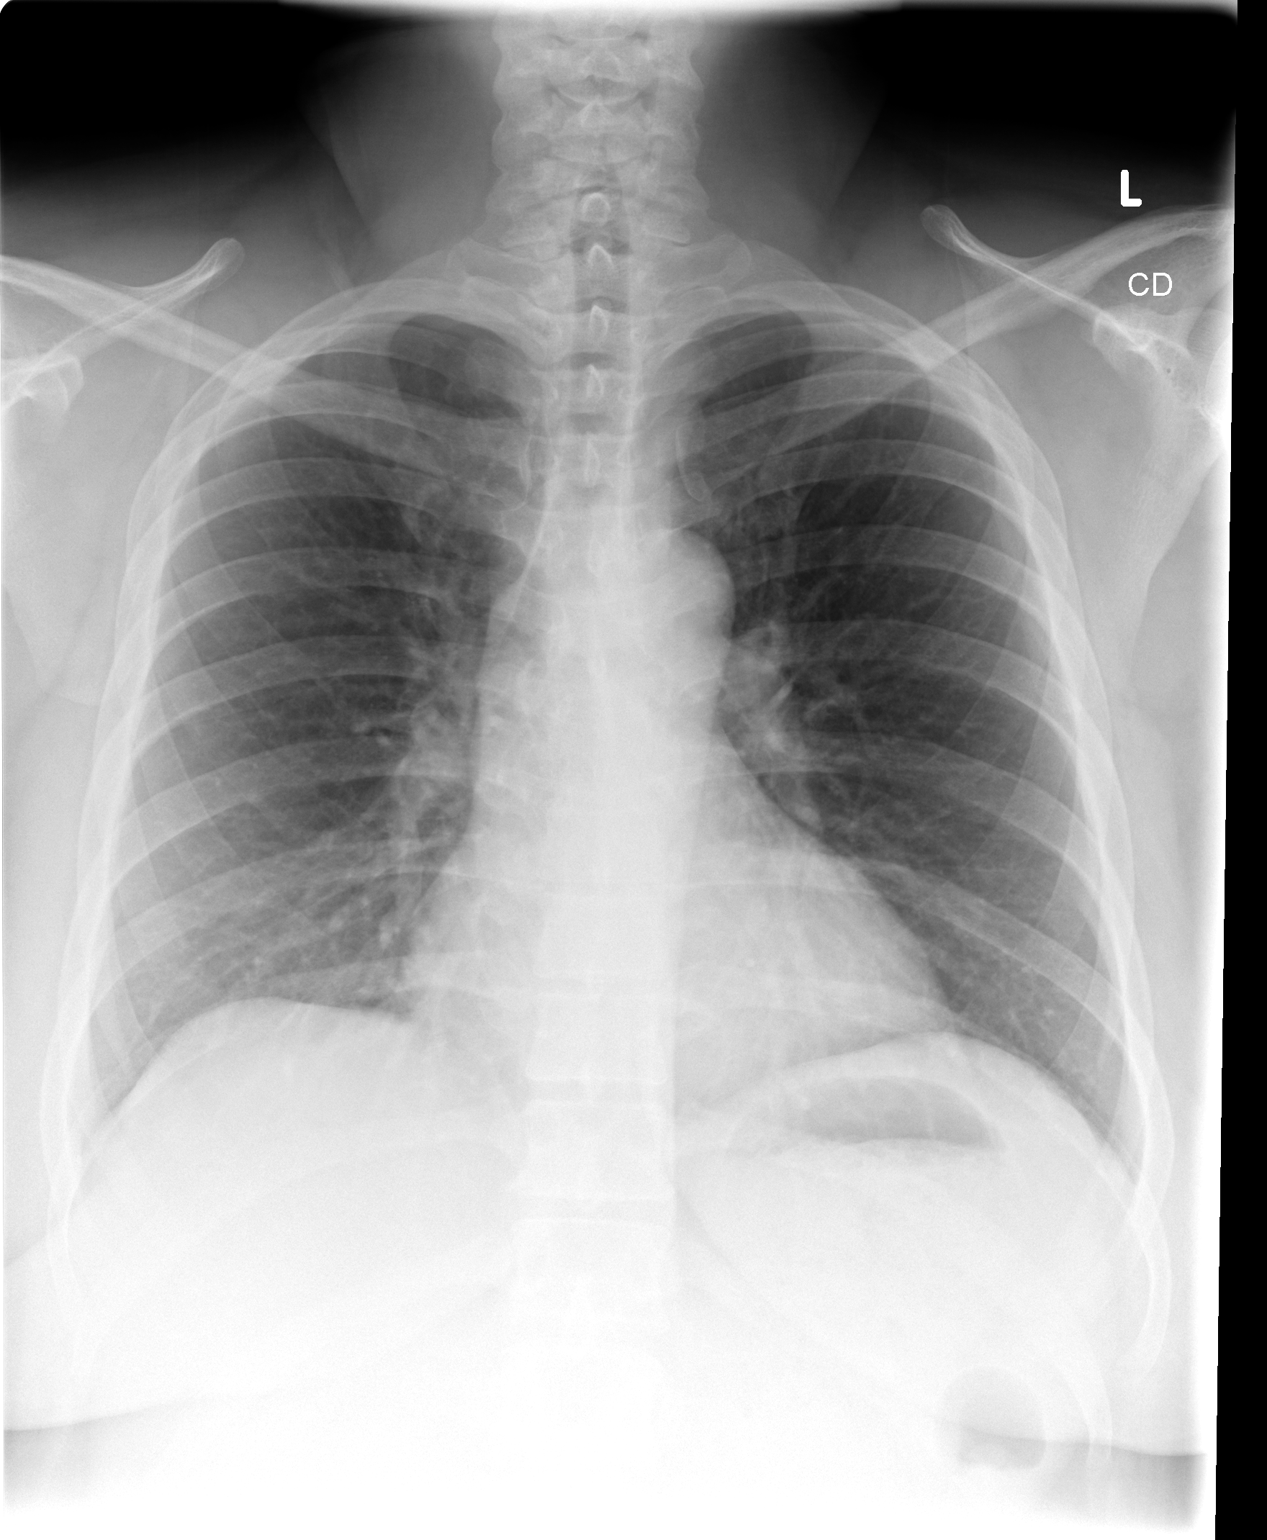

[1 of 1 positions shown; findings below may reference images not displayed]

FINDINGS: Heart and mediastinal contours are within normal limits.
No focal opacities or effusions.  No acute bony abnormality.
IMPRESSION: No active disease.

## 2009-09-22 ENCOUNTER — Encounter: Payer: Self-pay | Admitting: Family Medicine

## 2009-09-26 ENCOUNTER — Encounter: Payer: Self-pay | Admitting: Family Medicine

## 2009-09-29 ENCOUNTER — Telehealth (INDEPENDENT_AMBULATORY_CARE_PROVIDER_SITE_OTHER): Payer: Self-pay | Admitting: *Deleted

## 2009-10-18 ENCOUNTER — Ambulatory Visit: Payer: Self-pay | Admitting: Family Medicine

## 2009-10-18 DIAGNOSIS — E1140 Type 2 diabetes mellitus with diabetic neuropathy, unspecified: Secondary | ICD-10-CM | POA: Insufficient documentation

## 2009-10-18 DIAGNOSIS — J329 Chronic sinusitis, unspecified: Secondary | ICD-10-CM | POA: Insufficient documentation

## 2009-10-18 DIAGNOSIS — E1165 Type 2 diabetes mellitus with hyperglycemia: Secondary | ICD-10-CM | POA: Insufficient documentation

## 2009-10-18 HISTORY — DX: Type 2 diabetes mellitus with diabetic neuropathy, unspecified: E11.40

## 2009-11-21 ENCOUNTER — Encounter: Payer: Self-pay | Admitting: Family Medicine

## 2009-11-21 ENCOUNTER — Telehealth: Payer: Self-pay | Admitting: Family Medicine

## 2009-11-21 ENCOUNTER — Ambulatory Visit: Payer: Self-pay | Admitting: Family Medicine

## 2009-11-21 DIAGNOSIS — IMO0002 Reserved for concepts with insufficient information to code with codable children: Secondary | ICD-10-CM | POA: Insufficient documentation

## 2009-12-13 ENCOUNTER — Telehealth: Payer: Self-pay | Admitting: *Deleted

## 2010-01-14 ENCOUNTER — Emergency Department (HOSPITAL_COMMUNITY)
Admission: EM | Admit: 2010-01-14 | Discharge: 2010-01-15 | Payer: Self-pay | Source: Home / Self Care | Admitting: Emergency Medicine

## 2010-01-25 ENCOUNTER — Ambulatory Visit: Payer: Self-pay

## 2010-01-26 ENCOUNTER — Ambulatory Visit: Payer: Self-pay | Admitting: Family Medicine

## 2010-02-26 ENCOUNTER — Encounter: Payer: Self-pay | Admitting: Family Medicine

## 2010-03-09 NOTE — Miscellaneous (Signed)
Summary: U/S order  Clinical Lists Changes  Orders: Added new Test order of Ultrasound (Ultrasound) - Signed

## 2010-03-09 NOTE — Progress Notes (Signed)
Summary: Rx Prob  Phone Note Call from Patient Call back at Home Phone [phone]   Caller: Patient Summary of Call: Needs to talk to a nurse about her diabetic medication that was to [facility] and there was to be a change in dosage. Initial call taken by: Raymond Gurney,  September 29, 2009 10:27 AM  Follow-up for Phone Call        called patient then called pharmacy. apparently they has given her the glipizide with directions to take 1/2 tab in AM and 1/2 in evening .  An rx had been sent in by [name] on 09/05/2009 for 1 in AM and 1/2 in evening. they apparently did not receive that rx. verbally gave it to pharmacist as stated on current med list. Gilipizide 10 mg one tablet in morning and 1/2 tab in evening.  Follow-up by: Marcell Barlow RN,  September 29, 2009 3:38 PM

## 2010-03-09 NOTE — Miscellaneous (Signed)
Summary: Re: ophth referral  Clinical Lists Changes  Received notification from [facility] that they are unable to complete the referral for ophthalmology at this time due to lack of volunteer physicians in this speciality group. They will contact patient when they can process the referral. Marcell Barlow RN  Jun 14, 2009 11:24 AM

## 2010-03-09 NOTE — Letter (Signed)
Summary: Generic Letter  [facility]  [address]   [place], [place] [address]   Phone: [phone]  Fax: [phone]    07/25/2009  EMALEE KNIES [address] [place], [place]  [address]  Dear [name],   This letter is to inform you that your mamogram was normal.  If you have any questions please call the office at [phone].     Sincerely,   Isabelle Course

## 2010-03-09 NOTE — Assessment & Plan Note (Signed)
Summary:  right breast mass   Vital Signs:  Patient profile:   40 year old female Weight:      248.6 pounds Temp:     98.1 degrees F p Pulse rate:   101 / minute Pulse rhythm:   regular BP sitting:   149 / 98  (left arm) Cuff size:   large  Vitals Entered By: Audelia Hives CMA (July 22, 2009 2:38 PM)  Primary Care Provider:  Dion Body  MD  CC:  breast lump and boils on buttocks.  History of Present Illness: 40yo F c/o R breast lump and boils on buttocks  R breast lump: x 1 year.  Has never mention this before.  Reports increase in size.  Not painful.  No nipple discharge or bleeding.  No fevers or chills or abnl wt loss.  Does report nipple tenderness.  MGM has hx of breast CA.  Boils: Localized to buttocks.  Chronic condition and reoccurring.  Has hx of DM with poor control and compliance.  No associated fevers or chills.  Allergies: 1)  ! Penicillin G Potassium (Penicillin G Potassium)  Review of Systems      See HPI  Physical Exam  General:  VS Reviewed. Obese, well appearing, NAD.  Breasts:  2 x 4 cm firm breast mass deep within the breast tissue lateral to the right nipple; no nipple discharge or bleeding; no induration or erythema of the skin Skin:  old crusted hyperpigmented skin lesion on the buttocks; nothing acute present; no erythema, drainage, or tenderness Cervical Nodes:  No lymphadenopathy noted of cervical chain or supraclavicular Axillary Nodes:  No palpable lymphadenopathy   Impression & Recommendations:  Problem # 1:  BREAST MASS, RIGHT (ICD-611.72) Assessment New  Has been progressively enlarging over the past year but she has not informed a health care provider.   No adjacent lymphadenopathy. Plan for diagnostic mammogram for Monday 2pm. Will contact pt with results.  Orders: Mammogram (Mammogram) [place]- Est  Level 4 ([address]40)  Problem # 2:  SKIN LESION (ICD-709.9) Assessment: Unchanged  Old skin lesion with scarring from previous  boil. No acute infection.   Informed pt that DM is related to recurrent skin infections.    Orders: [place]- Est  Level 4 ([address])  Complete Medication List: 1)  Claritin 10 Mg Tabs (Loratadine) .... Take 1 tablet by mouth once a day 2)  Flonase 50 Mcg/act Susp (Fluticasone propionate) .... Spray 2 spray into both nostrils twice a day 3)  Glipizide 10 Mg Tabs (Glipizide) .... Take 1 tab in the morning and 1/2 tablet by mouth in the evening 4)  Onetouch Basic System W/device Kit (Blood glucose monitoring suppl) .... Use 1 strip as directed once a day 5)  Lisinopril-hydrochlorothiazide 20-12.5 Mg Tabs (Lisinopril-hydrochlorothiazide) .Marland Kitchen.. 1 tab by mouth daily 6)  Onetouch Ultra Test Strp (Glucose blood) .... Test blood sugar 3 times a day 7)  Lantus Solostar 100 Unit/ml Soln (Insulin glargine) .... 40 units daily   disp: 2 box 8)  Lipitor 20 Mg Tabs (Atorvastatin calcium) .Marland Kitchen.. 1 tab by mouth at bedtime 9)  Klor-con 20 Meq Pack (Potassium chloride) .... One tablet by mouth daily for low potassium 10)  Gabapentin 300 Mg Caps (Gabapentin) .Marland Kitchen.. 1 tab by mouth at bedtime x 3 days, then two times a day x 3 days, then three times a day x 3 days  Other Orders: A1C-[facility] ([id number])  Patient Instructions: 1)  I'll contact you with the results and we'll set up  follow up for the diabetes and hypertension with your new primary doctor.  Laboratory Results   Blood Tests   Date/Time Received: July 22, 2009 2:43 PM  Date/Time Reported: July 22, 2009 2:54 PM   HGBA1C: 8.2%   (Normal Range: Non-Diabetic - 3-6%   Control Diabetic - 6-8%)  Comments: ...........test performed by...........Marland KitchenHedy Camara, CMA

## 2010-03-09 NOTE — Progress Notes (Signed)
Summary: question about meds  Phone Note Call from Patient   Summary of Call: perscribed vit K health dept does not have it wants to know if you can call in an rx for klorcon since its on the $4 list [facility] ([address]) Initial call taken by: Raymond Gurney,  July 27, 2009 3:35 PM  Follow-up for Phone Call        Called and spoke with patient on the phone.  On record review, her K was 3.4 last visit.  Low range of normal is 3.5.  She is not on any medications that should lower her potassium.  Told her she does not need refill.  Will follow up for BMET check to ensure this.   Follow-up by: Annabell Sabal MD,  July 28, 2009 2:47 PM

## 2010-03-09 NOTE — Consult Note (Signed)
Summary: [facility]   Imported By: Raymond Gurney 09/23/2009 15:00:01  _____________________________________________________________________  External Attachment:    Type:   Image     Comment:   External Document

## 2010-03-09 NOTE — Progress Notes (Signed)
Summary: phn msg  Phone Note Call from Patient Call back at [phone]   Caller: Patient Summary of Call: returned call about [place] report Initial call taken by: Audie Clear,  April 15, 2009 4:45 PM  Follow-up for Phone Call        patient given message from MD. she has a scheduled appointment 05/12/2009. Follow-up by: Marcell Barlow RN,  April 15, 2009 4:53 PM

## 2010-03-09 NOTE — Progress Notes (Signed)
Summary: Rx Prob  Phone Note Call from Patient Call back at [phone]   Caller: Patient Summary of Call: Pt says the health dept did not get any of the rx's that Dr. Netty Starring sent over during her visit of 04/11/09. Initial call taken by: Raymond Gurney,  April 13, 2009 2:20 PM  Follow-up for Phone Call        [facility] did not receive RX that were faxed on 04/11/2009. Rx verbally gave rx to pharmacist for all the meds . Follow-up by: Marcell Barlow RN,  April 13, 2009 4:15 PM

## 2010-03-09 NOTE — Letter (Signed)
Summary: Results Follow-up Letter  [facility]  [address]   [place], [place] [address]   Phone: [phone]  Fax: [phone]    04/13/2009  Alicia West, [place]  [address]  Dear Alicia West,   Attached are the results of your recent test(s):  Your potassium is still a little low therefore, I want you to continue on the potassium supplements.  Your kidney and liver function is normal.  Your cholesterol was at goal.  No changes to medications.  Call me with any questions.   Sincerely,  Dion Body  MD [facility]           Appended Document: Results Follow-up Letter letter  and results sent

## 2010-03-09 NOTE — Progress Notes (Signed)
Summary: Rx Prob  Phone Note Call from Patient Call back at [phone]   Caller: Patient Summary of Call: [facility] for bp and there is nothing there for her.   Initial call taken by: Raymond Gurney,  March 03, 2009 4:31 PM    Prescriptions: BENAZEPRIL-HYDROCHLOROTHIAZIDE 20-25 MG TABS (BENAZEPRIL-HYDROCHLOROTHIAZIDE) one tablet by mouth daily for blood pressure  #30 x 0   Entered by:   Marcell Barlow RN   Authorized by:   Dion Body  MD   Signed by:   Marcell Barlow RN on 03/03/2009   Method used:   Telephoned to ...       [facility] (retail)       [place], [place]  [address]       Ph: [phone]       Fax: [phone]   RxID:   [id number]  advised patient that we have received no notification from pharmacy that she needed her BP med. advised will send in now but she needs to schedule appointment with PCP soon before next refill needed.. she voices understanding. Marcell Barlow RN  March 03, 2009 4:43 PM

## 2010-03-09 NOTE — Assessment & Plan Note (Signed)
Summary: READ PPD/[facility]  Nurse Visit   Allergies: 1)  ! Penicillin G Potassium (Penicillin G Potassium)  PPD Results    Date of reading: 09/05/2009    Results: 11 mm X 9 mm    Interpretation: positive  Orders Added: 1)  No Charge Patient Arrived (NCPA0) [NCPA0]  Dr. Nori Riis verified PPD reading.  Patient 's last PPD  Nov 17, 2007 was read at negative at 48 hours, . patient called back a week later reporting raised area on area where PPD was applied  later the day it was read  and she was scheduled for appointment to come in the next morning regarding this. She did not come to that appointment.  [facility] and patient is schedule for appointment to go there today at 4:00 for follow up. records faxed.  Marcell Barlow RN  September 05, 2009 2:13 PM

## 2010-03-09 NOTE — Assessment & Plan Note (Signed)
Summary: sore on r breat/[place]/walden   Vital Signs:  Patient profile:   40 year old female Weight:      256.3 pounds Temp:     98.5 degrees F Pulse rate:   98 / minute BP sitting:   162 / 102  Vitals Entered By: Elige Radon RN (November 21, 2009 10:15 AM) CC: sore right breast   Primary Care Sadler Teschner:  Annabell Sabal MD  CC:  sore right breast.  History of Present Illness: 40 y/o F with "sore on right breast"  She is here for an open "sore".  She noticed it last night.  She does not know how long it has been there because she has very little sensation in her breast areas.   She had breast reduction b/l 10 yrs ago, and numbness to both sides since.  Last night pt was drying off when she noticed a bump. She tried to look in the mirror, and it seem to have opened with some drainage.    no fever, some chills x 2-3 days + nausea, which she attributes to [place] which she has been on for 2 months no vomiting History of boils on buttocks and abd, about 1 month ago.   Allergies (verified): 1)  ! Penicillin G Potassium (Penicillin G Potassium)  Review of Systems General:  Complains of chills; denies fever and weakness. Derm:  Denies changes in color of skin, changes in nail beds, itching, poor wound healing, and rash.  Physical Exam  General:  Well-developed,well-nourished,in no acute distress; alert,appropriate and cooperative throughout examination. Vitals reviewed.   Skin:  R axilla area/scapula area: Along the incisional scar from breast reduction surgery there is an open wound size 1/2 cm x 1 cm.  Draining yellowish pus.  Epidermis underneath appears healthy.  No redness. no erythema. no swelling.  no fluctulance.  Cervical Nodes:  No lymphadenopathy noted Axillary Nodes:  No palpable lymphadenopathy   Impression & Recommendations:  Problem # 1:  ABSCESS, AXILLA, RIGHT (ICD-682.3) Assessment New Culture of abscess sent.  Will start on Doxycycline.  Does not look infected at  this time, I am hoping to prevent that with the antibiotic.  Red flags given to rtc.  Pt to rtc in 1 wk for recheck.    The following medications were removed from the medication list:    Zithromax Z-pak 250 Mg Tabs (Azithromycin) .Marland Kitchen... As directed Her updated medication list for this problem includes:    Isoniazid 300 Mg Tabs (Isoniazid) ..... One daily, for + ppd    Doxycycline Hyclate 100 Mg Caps (Doxycycline hyclate) .Marland Kitchen... 1 tab by mouth two times a day x 10 days  Orders: Culture, Wound -[facility] ([id number]) Gram Stain-[facility] ([id number]) [place]- Est Level  3 ([address])  Complete Medication List: 1)  Flonase 50 Mcg/act Susp (Fluticasone propionate) .... Spray 2 spray into both nostrils twice a day 2)  Glipizide 10 Mg Tabs (Glipizide) .... Take 1 tab in the morning and 1/2 tablet by mouth in the evening 3)  Onetouch Basic System W/device Kit (Blood glucose monitoring suppl) .... Use 1 strip as directed once a day 4)  Lisinopril-hydrochlorothiazide 20-12.5 Mg Tabs (Lisinopril-hydrochlorothiazide) .Marland Kitchen.. 1 tab by mouth daily 5)  Onetouch Ultra Test Strp (Glucose blood) .... Test blood sugar 3 times a day 6)  Lantus Solostar 100 Unit/ml Soln (Insulin glargine) .... 40 units daily   disp: 2 box 7)  Lipitor 20 Mg Tabs (Atorvastatin calcium) .Marland Kitchen.. 1 tab by mouth at bedtime 8)  Zyrtec Allergy  10 Mg Tbdp (Cetirizine hcl) .... Daily 9)  Isoniazid 300 Mg Tabs (Isoniazid) .... One daily, for + ppd 10)  Hydromet 5-1.5 Mg/66m Syrp (Hydrocodone-homatropine) .... One teaspoonful three times a day as needed, 150 cc 11)  Doxycycline Hyclate 100 Mg Caps (Doxycycline hyclate) ..Marland Kitchen. 1 tab by mouth two times a day x 10 days  Patient Instructions: 1)  Please schedule a follow-up appointment in 1 week with Dr TEarley Favoror Dr WMingo Amber 2)  If you have fevers, vomiting, worse swelling, redness that is spreading, call u[place] away. 3)  Doxycycline 1045mtwo times a day. Take with food.   Prescriptions: DOXYCYCLINE HYCLATE 100 MG CAPS  (DOXYCYCLINE HYCLATE) 1 tab by mouth two times a day x 10 days  #20 x 0   Entered and Authorized by:   CaMerla RichesD   Signed by:   CaMerla RichesD on 11/21/2009   Method used:   [facility] ...       [facility]       [place]  [id number]     Ph: [phone]     Fax: [phone] RxID:   [id number]  Orders Added: 1)  [facility] 2)  Gram Stain-[facility] [[id number]] 3)  [facility] [9[[address]]

## 2010-03-09 NOTE — Progress Notes (Signed)
  Phone Note Call from Patient   Caller: Patient Call For: [phone] Summary of Call: Pt has big open wound to R breast under arm.   Patient have concern that it may be due to her diabetes area. Think also it may be from irritation where her bra was.  She has nerve damage so is unable to feel anything in that area. Initial call taken by: Eusebio Friendly,  November 21, 2009 9:27 AM  Follow-up for Phone Call        no answer Follow-up by: Elige Radon RN,  November 21, 2009 10:04 AM  Additional Follow-up for Phone Call Additional follow up Details #1::        she walked in. states she just noticed it last night. had breast reduction surgery 2 yrs ago. states she has little feeling in the incision area. VS obtained & appt made to see work in md Additional Follow-up by: Elige Radon RN,  November 21, 2009 10:08 AM

## 2010-03-09 NOTE — Progress Notes (Signed)
Summary: refill  Phone Note Refill Request Call back at Home Phone [phone] Message from:  Patient  Refills Requested: Medication #1:  GLIPIZIDE 10 MG TABS Take 1 tab in the morning and 1/2 tablet by mouth in the evening   Brand Name Necessary? No Initial call taken by: Audie Clear,  September 02, 2009 2:47 PM    Prescriptions: GLIPIZIDE 10 MG TABS (GLIPIZIDE) Take 1 tab in the morning and 1/2 tablet by mouth in the evening  #60 x 3   Entered by:   Annabell Sabal MD   Authorized by:   Marlana Salvage MD   Signed by:   Annabell Sabal MD on 09/05/2009   Method used:   Print then Give to Patient   RxID:   [id number]

## 2010-03-09 NOTE — Assessment & Plan Note (Signed)
Summary: tb test,df  Nurse Visit   Allergies: 1)  ! Penicillin G Potassium (Penicillin G Potassium)  Immunizations Administered:  PPD Skin Test:    Vaccine Type: PPD    Site: left forearm    Mfr: Sanofi Pasteur    Dose: 0.1 ml    Route: ID    Given by: Marcell Barlow RN    Exp. Date: 11/18/2010    Lot #: [id number]  Orders Added: 1)  TB Skin Test [86580] 2)  Admin 1st Vaccine [phone]

## 2010-03-09 NOTE — Consult Note (Signed)
Summary: [facility]  [facility]   Imported By: Raymond Gurney 09/28/2009 11:09:52  _____________________________________________________________________  External Attachment:    Type:   Image     Comment:   External Document

## 2010-03-09 NOTE — Assessment & Plan Note (Signed)
Summary: SINUS PROBLEM/COUGH/? FROM [facility] MEDS/[facility]   Vital Signs:  Patient profile:   40 year old female Weight:      254 pounds Temp:     98.2 degrees F oral Pulse rate:   98 / minute Pulse rhythm:   regular BP sitting:   146 / 100  (left arm) Cuff size:   large  Vitals Entered By: Audelia Hives CMA (October 18, 2009 8:30 AM) CC: sinus Is Patient Diabetic? Yes Did you bring your meter with you today? No Comments pt states that she has been having headaches for appx. 4 days, coughing,sneezing,drainage.    Primary Care Provider:  Dion Body  MD  CC:  sinus.  History of Present Illness: Chronic sinus problems with HA and cough that is keep  her up at night.  She reports being on INH for +PPD for 9 months.  She uses nasal steroids but not consistently, she takes Zyrtec daily.  Diabetes had been poorly controlled with improvement over past 6 months.  Diet is high in meat, she is not crazy about eating plant based foods.  Habits & Providers  Alcohol-Tobacco-Diet     Tobacco Status: never     Tobacco Counseling: not indicated; no tobacco use  Current Medications (verified): 1)  Flonase 50 Mcg/act Susp (Fluticasone Propionate) .... Spray 2 Spray Into Both Nostrils Twice A Day 2)  Glipizide 10 Mg Tabs (Glipizide) .... Take 1 Tab in The Morning and 1/2 Tablet By Mouth in The Evening 3)  Onetouch Basic System W/device Kit (Blood Glucose Monitoring Suppl) .... Use 1 Strip As Directed Once A Day 4)  Lisinopril-Hydrochlorothiazide 20-12.5 Mg Tabs (Lisinopril-Hydrochlorothiazide) .Marland Kitchen.. 1 Tab By Mouth Daily 5)  Onetouch Ultra Test  Strp (Glucose Blood) .... Test Blood Sugar 3 Times A Day 6)  Lantus Solostar 100 Unit/ml Soln (Insulin Glargine) .... 40 Units Daily   Disp: 2 Box 7)  Lipitor 20 Mg Tabs (Atorvastatin Calcium) .Marland Kitchen.. 1 Tab By Mouth At Bedtime 8)  Zyrtec Allergy 10 Mg Tbdp (Cetirizine Hcl) .... Daily 9)  Isoniazid 300 Mg Tabs (Isoniazid) .... One Daily, For + Ppd 10)   Zithromax Z-Pak 250 Mg Tabs (Azithromycin) .... As Directed 11)  Hydromet 5-1.5 Mg/39m Syrp (Hydrocodone-Homatropine) .... One Teaspoonful Three Times A Day As Needed, 150 Cc  Allergies (verified): 1)  ! Penicillin G Potassium (Penicillin G Potassium)  Review of Systems General:  Denies fever. ENT:  Complains of nasal congestion, postnasal drainage, and sinus pressure. Resp:  Complains of cough and sputum productive.  Physical Exam  General:  Frequent couging Ears:  TMs normal Nose:  inflammed Mouth:  post nasal drainage Neck:  No deformities, masses, or tenderness noted. Lungs:  normal respiratory effort and normal breath sounds.   Heart:  Rate 98 regular   Impression & Recommendations:  Problem # 1:  SINUSITIS, CHRONIC (ICD-473.9)  Wiith acute flair, will treat with Zpack and cough suppressant.  Instructed on nasal saline, nasal steroids and histamine blocker. Her updated medication list for this problem includes:    Flonase 50 Mcg/act Susp (Fluticasone propionate) ..... Spray 2 spray into both nostrils twice a day    Isoniazid 300 Mg Tabs (Isoniazid) ..... One daily, for + ppd    Zithromax Z-pak 250 Mg Tabs (Azithromycin) ..Marland Kitchen.. As directed    Hydromet 5-1.5 Mg/558mSyrp (Hydrocodone-homatropine) ..... One teaspoonful three times a day as needed, 150 cc  [facility]  Level 4 ([phone] Problem # 2:  DIABETES MELLITUS, TYPE II,  UNCONTROLLED (ICD-250.02) A1C stable at 8.2, dietary changes discussed, she does not exercise Her updated medication list for this problem includes:    Glipizide 10 Mg Tabs (Glipizide) .Marland Kitchen... Take 1 tab in the morning and 1/2 tablet by mouth in the evening    Lisinopril-hydrochlorothiazide 20-12.5 Mg Tabs (Lisinopril-hydrochlorothiazide) .Marland Kitchen... 1 tab by mouth daily    Lantus Solostar 100 Unit/ml Soln (Insulin glargine) .Marland KitchenMarland KitchenMarland KitchenMarland Kitchen 40 units daily   disp: 2 box  Orders: A1C-[facility] (87564) [place]- Est  Level 4 (99214)  Problem # 3:  POSITIVE PPD  (ICD-795.5)  on 9 months of INH through the health dept   Orders: [facility]- Est  Level 4 (33295)  Problem # 4:  HYPERTENSION, BENIGN SYSTEMIC (ICD-401.1) Poor control, has been on and off BP med, will need to follow up with primary MD for changes, Her updated medication list for this problem includes:    Lisinopril-hydrochlorothiazide 20-12.5 Mg Tabs (Lisinopril-hydrochlorothiazide) .Marland Kitchen... 1 tab by mouth daily  Complete Medication List: 1)  Flonase 50 Mcg/act Susp (Fluticasone propionate) .... Spray 2 spray into both nostrils twice a day 2)  Glipizide 10 Mg Tabs (Glipizide) .... Take 1 tab in the morning and 1/2 tablet by mouth in the evening 3)  Onetouch Basic System W/device Kit (Blood glucose monitoring suppl) .... Use 1 strip as directed once a day 4)  Lisinopril-hydrochlorothiazide 20-12.5 Mg Tabs (Lisinopril-hydrochlorothiazide) .Marland Kitchen.. 1 tab by mouth daily 5)  Onetouch Ultra Test Strp (Glucose blood) .... Test blood sugar 3 times a day 6)  Lantus Solostar 100 Unit/ml Soln (Insulin glargine) .... 40 units daily   disp: 2 box 7)  Lipitor 20 Mg Tabs (Atorvastatin calcium) .Marland Kitchen.. 1 tab by mouth at bedtime 8)  Zyrtec Allergy 10 Mg Tbdp (Cetirizine hcl) .... Daily 9)  Isoniazid 300 Mg Tabs (Isoniazid) .... One daily, for + ppd 10)  Zithromax Z-pak 250 Mg Tabs (Azithromycin) .... As directed 11)  Hydromet 5-1.5 Mg/36m Syrp (Hydrocodone-homatropine) .... One teaspoonful three times a day as needed, 150 cc  Patient Instructions: 1)  Increase flonase to two times a day  2)  Take Zyrtec daily 3)  Eat more veges 4)  use cough syrup at night to promote rest 5)  drink a lot of water Prescriptions: HYDROMET 5-1.5 MG/5ML SYRP (HYDROCODONE-HOMATROPINE) one teaspoonful three times a day as needed, 150 cc Brand medically necessary #1 x 0   Entered and Authorized by:   STereasa CoopNP   Signed by:   STereasa CoopNP on 10/18/2009   Method used:   Print then Give to Patient   RxID:    [id number] Z-PAK 250 MG TABS (AZITHROMYCIN) as directed Brand medically necessary #1 x 0   Entered and Authorized by:   STereasa CoopNP   Signed by:   STereasa CoopNP on 10/18/2009   Method used:   Print then Give to Patient   RxID:   [id number]  Laboratory Results   Blood Tests   Date/Time Received: October 18, 2009 9:07 AM  Date/Time Reported: October 18, 2009 9:32 AM   HGBA1C: 8.2%   (Normal Range: Non-Diabetic - 3-6%   Control Diabetic - 6-8%)  Comments: ...............test performed by.......Marland Kitchenonnie A. JMartinique MLS (ASCP)cm

## 2010-03-09 NOTE — Assessment & Plan Note (Signed)
Summary: f/u DM, HTN, and R inguinal pain   Vital Signs:  Patient profile:   41 year old female Height:      69 inches Weight:      245.9 pounds BMI:     36.44 Temp:     98.6 degrees F oral Pulse rate:   120 / minute BP sitting:   142 / 99  (left arm) Cuff size:   large  Vitals Entered By: Isabelle Course (May 19, 2009 8:50 AM) CC: F/U  HTN, C/O left foot numb,burning, stabbing sensation X1-2 mos Is Patient Diabetic? Yes Did you bring your meter with you today? No Pain Assessment Patient in pain? yes     Location: foot Intensity: 3 Type: burning   Primary Care Provider:  Dion Body  MD  CC:  F/U  HTN, C/O left foot numb, burning, and stabbing sensation X1-2 mos.  History of Present Illness: 40yo F here for f/u DM, HTN, and neuropathy symptoms  HTN: No adverse effects from medication but has not taken it in a few days.  Not checking it regularly.  Was not well controlled at last visit.  No dizziness, HA, CP, palpitations, or swelling.  DM:  Currently on Lantus 40 units in the AM and Glipizide 65m bid.  No adverse effects.  No hypoglycemic events.  Endorses paresthesia or peripheral nerve pain in left foot.  CBGs checked 1 time a day and when she feels bad and typically runs b/w 140s-380s.  Due for diabetic eye exam.  R inguinal pain: Resolved.  s/p transvaginal U/S that conveyed fibroids.  She denies any vaginal bleeding.   Habits & Providers  Alcohol-Tobacco-Diet     Tobacco Status: never  Current Medications (verified): 1)  Claritin 10 Mg Tabs (Loratadine) .... Take 1 Tablet By Mouth Once A Day 2)  Flonase 50 Mcg/act Susp (Fluticasone Propionate) .... Spray 2 Spray Into Both Nostrils Twice A Day 3)  Glipizide 10 Mg Tabs (Glipizide) .... Take 1/2 Tablet By Mouth Twice A Day 4)  Onetouch Basic System W/device Kit (Blood Glucose Monitoring Suppl) .... Use 1 Strip As Directed Once A Day 5)  Lisinopril-Hydrochlorothiazide 20-12.5 Mg Tabs  (Lisinopril-Hydrochlorothiazide) ..Marland Kitchen. 1 Tab By Mouth Daily 6)  Onetouch Ultra Test  Strp (Glucose Blood) .... Test Blood Sugar 3 Times A Day 7)  Lantus Solostar 100 Unit/ml Soln (Insulin Glargine) .... Start At 35 Units Daily and Increase By 1 Unit Each Day Until Your Morning Blood Sugar Is Less Than 125 Disp: 2 Box 8)  Lipitor 20 Mg Tabs (Atorvastatin Calcium) ..Marland Kitchen. 1 Tab By Mouth At Bedtime 9)  Klor-Con 20 Meq Pack (Potassium Chloride) .... One Tablet By Mouth Daily For Low Potassium 10)  Gabapentin 300 Mg Caps (Gabapentin) ..Marland Kitchen. 1 Tab By Mouth At Bedtime X 3 Days, Then Two Times A Day X 3 Days, Then Three Times A Day X 3 Days  Allergies (verified): 1)  Amoxicillin (Amoxicillin)  Review of Systems        No dizziness, HA, CP, palpitations, or swelling.  Physical Exam  General:  VS Reviewed. Obese, well appearing, NAD.  Lungs:  Normal respiratory effort, chest expands symmetrically. Lungs are clear to auscultation, no crackles or wheezes. Heart:  Normal rate and regular rhythm. S1 and S2 normal without gallop, murmur, click, rub or other extra sounds. Extremities:  no edema Neurologic:  subjective decrease sensation below left lateral malleolus Skin:  no erythema, lesions, or deformity below left lateral malleolus  Diabetes Management Exam:  Foot Exam (with socks and/or shoes not present):       Sensory-Pinprick/Light touch:          Left medial foot (L-4): normal          Left dorsal foot (L-5): normal          Left lateral foot (S-1): normal          Right medial foot (L-4): normal          Right dorsal foot (L-5): normal          Right lateral foot (S-1): normal       Sensory-Monofilament:          Left foot: normal          Right foot: normal       Inspection:          Left foot: normal          Right foot: normal       Nails:          Left foot: normal          Right foot: normal   Impression & Recommendations:  Problem # 1:  INGUINAL PAIN, RIGHT  (ICD-789.09) Assessment Improved  Symptoms spontaneously resolved. s/p U/S that conveyed fibroids which is likely not related to her symptoms. No further intervention needed.  Orders: [place]- Est  Level 4 (97673)  Problem # 2:  HYPERTENSION, BENIGN SYSTEMIC (ICD-401.1) Assessment: Unchanged  Not at goal (<140/90). Has been off her meds.  Plan to switch to Lisinopril/HCTZ for financial and accessibility reasons.  Her updated medication list for this problem includes:    Lisinopril-hydrochlorothiazide 20-12.5 Mg Tabs (Lisinopril-hydrochlorothiazide) .Marland Kitchen... 1 tab by mouth daily  Orders: [place]- Est  Level 4 (99214)  Problem # 3:  IDDM (ICD-250.01) Assessment: Deteriorated Not well controlled and now with neuropathic complications. Seems that she is not very compliant with her diet and has acute rises in glucose b/c of the food choices she makes. We talked in length about the importance of regulating her diet. Will add gabapentin to help with her neuropathic complaints.  Her updated medication list for this problem includes:    Glipizide 10 Mg Tabs (Glipizide) .Marland Kitchen... Take 1/2 tablet by mouth twice a day    Lisinopril-hydrochlorothiazide 20-12.5 Mg Tabs (Lisinopril-hydrochlorothiazide) .Marland Kitchen... 1 tab by mouth daily    Lantus Solostar 100 Unit/ml Soln (Insulin glargine) ..... Start at 35 units daily and increase by 1 unit each day until your morning blood sugar is less than 125 disp: 2 box  Orders: Ophthalmology Referral (Ophthalmology) [facility]- Est  Level 4 (41937)  Complete Medication List: 1)  Claritin 10 Mg Tabs (Loratadine) .... Take 1 tablet by mouth once a day 2)  Flonase 50 Mcg/act Susp (Fluticasone propionate) .... Spray 2 spray into both nostrils twice a day 3)  Glipizide 10 Mg Tabs (Glipizide) .... Take 1/2 tablet by mouth twice a day 4)  Onetouch Basic System W/device Kit (Blood glucose monitoring suppl) .... Use 1 strip as directed once a day 5)  Lisinopril-hydrochlorothiazide  20-12.5 Mg Tabs (Lisinopril-hydrochlorothiazide) .Marland Kitchen.. 1 tab by mouth daily 6)  Onetouch Ultra Test Strp (Glucose blood) .... Test blood sugar 3 times a day 7)  Lantus Solostar 100 Unit/ml Soln (Insulin glargine) .... Start at 35 units daily and increase by 1 unit each day until your morning blood sugar is less than 125 disp: 2 box 8)  Lipitor 20 Mg Tabs (Atorvastatin calcium) .Marland Kitchen.. 1 tab by mouth at  bedtime 9)  Klor-con 20 Meq Pack (Potassium chloride) .... One tablet by mouth daily for low potassium 10)  Gabapentin 300 Mg Caps (Gabapentin) .Marland Kitchen.. 1 tab by mouth at bedtime x 3 days, then two times a day x 3 days, then three times a day x 3 days  Patient Instructions: 1)  Please schedule a follow-up appointment in 1 month to reassess your diabetes and high blood pressure. 2)  I started you on Gabapentin for the nerve irritation. 3)  Also changed your blood pressure to lisinopril and hctz and sent it to [facility]. 4)    5)  Please check your blood pressure to make sure it is stable. Prescriptions: GABAPENTIN 300 MG CAPS (GABAPENTIN) 1 tab by mouth at bedtime x 3 days, then two times a day x 3 days, then three times a day x 3 days  #90 x 1   Entered and Authorized by:   Dion Body  MD   Signed by:   Dion Body  MD on 05/19/2009   Method used:   Faxed to ...       [facility] (retail)       [place], [place]  [address]       Ph: [phone]       Fax: [phone]   RxID:   [id number] LISINOPRIL-HYDROCHLOROTHIAZIDE 20-12.5 MG TABS (LISINOPRIL-HYDROCHLOROTHIAZIDE) 1 tab by mouth daily  #90 x 1   Entered and Authorized by:   Dion Body  MD   Signed by:   Dion Body  MD on 05/19/2009   Method used:   Electronically to        [facility] [phone]* (retail)       [address]       [place], [place]  [address]       Ph: [phone]       Fax: [phone]   RxID:   [id number]     Prevention & Chronic  Care Immunizations   Influenza vaccine: Fluvax Non-MCR  (11/29/2008)    Tetanus booster: 04/05/2001: Done.    Pneumococcal vaccine: Not documented  Other Screening   Pap smear: NEGATIVE FOR INTRAEPITHELIAL LESIONS OR MALIGNANCY.  (10/28/2007)   Pap smear action/deferral: Deferred  (04/11/2009)   Smoking status: never  (05/19/2009)  Diabetes Mellitus   HgbA1C: 10.0  (04/11/2009)   HgbA1C action/deferral: Ordered  (04/11/2009)    Eye exam: Not documented   Diabetic eye exam action/deferral: Ophthalmology referral  (05/19/2009)    Foot exam: yes  (05/19/2009)   Foot exam action/deferral: Do today   High risk foot: Not documented   Foot care education: Not documented    Urine microalbumin/creatinine ratio: Not documented    Diabetes flowsheet reviewed?: Yes   Progress toward A1C goal: Unchanged  Lipids   Total Cholesterol: 128  (04/11/2009)   Lipid panel action/deferral: LDL Direct Ordered   LDL: 63  (04/11/2009)   LDL Direct: Not documented   HDL: 41  (04/11/2009)   Triglycerides: 119  (04/11/2009)    SGOT (AST): 12  (04/11/2009)   BMP action: Ordered   SGPT (ALT): <8 U/L  (04/11/2009)   Alkaline phosphatase: 81  (04/11/2009)   Total bilirubin: 0.3  (04/11/2009)  Hypertension   Last Blood Pressure: 142 / 99  (05/19/2009)   Serum creatinine: 0.79  (04/11/2009)   BMP action: Ordered   Serum potassium 3.4  (04/11/2009)    Hypertension flowsheet reviewed?: Yes   Progress  toward BP goal: Unchanged  Self-Management Support :   Personal Goals (by the next clinic visit) :     Personal A1C goal: 8  (11/29/2008)     Personal blood pressure goal: 140/90  (11/29/2008)     Personal LDL goal: 100  (11/29/2008)    Patient will work on the following items until the next clinic visit to reach self-care goals:     Medications and monitoring: take my medicines every day, check my blood sugar, check my blood pressure, bring all of my medications to every visit  (05/19/2009)      Eating: drink diet soda or water instead of juice or soda, eat more vegetables, use fresh or frozen vegetables, eat foods that are low in salt, eat baked foods instead of fried foods, eat fruit for snacks and desserts, limit or avoid alcohol  (05/19/2009)     Activity: take a 30 minute walk every day  (11/29/2008)    Diabetes self-management support: Written self-care plan, Education handout  (05/19/2009)   Diabetes care plan printed   Diabetes education handout printed    Hypertension self-management support: Written self-care plan  (05/19/2009)   Hypertension self-care plan printed.    Lipid self-management support: Written self-care plan  (05/19/2009)   Lipid self-care plan printed.   Nursing Instructions: Refer for screening diabetic eye exam (see order) Diabetic foot exam today

## 2010-03-09 NOTE — Progress Notes (Signed)
Summary: referral  Phone Note Call from Patient Call back at Home Phone [phone]   Caller: Patient Summary of Call: has appt for [place] [facility] today at 9:30 - needs referral faxed there asap Initial call taken by: Audie Clear,  December 13, 2009 9:06 AM  Follow-up for Phone Call        Spoke with patient, she was referred to [place] thru [facility] in April and had an abnormal diabetic eye exam and was supposed to have a follow up but pt needs another referral sent thru [facility] in order for this to happen. Referral put in and faxed. FYI to MD Follow-up by: Levert Feinstein LPN,  December 14, 4578 9:32 AM

## 2010-03-09 NOTE — Assessment & Plan Note (Signed)
Summary: f/u htn, dm, and obesity   Vital Signs:  Patient profile:   40 year old female Height:      69 inches Weight:      245 pounds BMI:     36.31 Temp:     98.8 degrees F Pulse rate:   87 / minute BP sitting:   150 / 105  (left arm) Cuff size:   large  Vitals Entered By: Elray Mcgregor RN (Jun 21, 2009 4:10 PM) CC: f/u HTN or DM Is Patient Diabetic? Yes Did you bring your meter with you today? No Pain Assessment Patient in pain? no        Primary Care Provider:  Dion Body  MD  CC:  f/u HTN or DM.  History of Present Illness: 40yo F here for f/u HTN and DM  HTN: No adverse effects from medication.  She was switched to lisinopril/hctz at last visit for financial reasons but noticed it was only $2 difference.  Not checking it regularly.  She did not take her medication today.  Was not at goal at last visit.  No dizziness, HA, CP, palpitations, or swelling.  DM:  Currently on Lantus 40 units in the AM and Glipizide.  No adverse effects.  No hypoglycemic events. Have not picked up gabapentin for nerve pain.  CBGs checked 1 time a day and typically run b/w 170s-230s.    Obesity: Pt trying to lose weight with exercise and diet.  States she has lost 5lbs but actually according to our scales, is the same weight.      Habits & Providers  Alcohol-Tobacco-Diet     Tobacco Status: never     Tobacco Counseling: not indicated; no tobacco use  Current Medications (verified): 1)  Claritin 10 Mg Tabs (Loratadine) .... Take 1 Tablet By Mouth Once A Day 2)  Flonase 50 Mcg/act Susp (Fluticasone Propionate) .... Spray 2 Spray Into Both Nostrils Twice A Day 3)  Glipizide 10 Mg Tabs (Glipizide) .... Take 1 Tab in The Morning and 1/2 Tablet By Mouth in The Evening 4)  Onetouch Basic System W/device Kit (Blood Glucose Monitoring Suppl) .... Use 1 Strip As Directed Once A Day 5)  Lisinopril-Hydrochlorothiazide 20-12.5 Mg Tabs (Lisinopril-Hydrochlorothiazide) .Marland Kitchen.. 1 Tab By Mouth  Daily 6)  Onetouch Ultra Test  Strp (Glucose Blood) .... Test Blood Sugar 3 Times A Day 7)  Lantus Solostar 100 Unit/ml Soln (Insulin Glargine) .... 40 Units Daily   Disp: 2 Box 8)  Lipitor 20 Mg Tabs (Atorvastatin Calcium) .Marland Kitchen.. 1 Tab By Mouth At Bedtime 9)  Klor-Con 20 Meq Pack (Potassium Chloride) .... One Tablet By Mouth Daily For Low Potassium 10)  Gabapentin 300 Mg Caps (Gabapentin) .Marland Kitchen.. 1 Tab By Mouth At Bedtime X 3 Days, Then Two Times A Day X 3 Days, Then Three Times A Day X 3 Days  Allergies: 1)  ! Penicillin G Potassium (Penicillin G Potassium)  Review of Systems      See HPI  Physical Exam  General:  VS Reviewed. Obese, well appearing, NAD.  Lungs:  Normal respiratory effort, chest expands symmetrically. Lungs are clear to auscultation, no crackles or wheezes. Heart:  Normal rate and regular rhythm. S1 and S2 normal without gallop, murmur, click, rub or other extra sounds. Extremities:  no edema   Impression & Recommendations:  Problem # 1:  HYPERTENSION, BENIGN SYSTEMIC (ICD-401.1) Assessment Unchanged BP not at goal (<140/90). Essentially unchanged but did not take medication today and has not kept a journal  of BP as directed therefore unsure of BP trend. Will keep regimen the same and have her track her BP. f/u in 1 month.  Her updated medication list for this problem includes:    Lisinopril-hydrochlorothiazide 20-12.5 Mg Tabs (Lisinopril-hydrochlorothiazide) .Marland Kitchen... 1 tab by mouth daily  Orders: [facility]- Est  Level 4 (99214)  Problem # 2:  IDDM (ICD-250.01) Assessment: Improved CBGs no longer in the 300s.  No hypoglycemic events. She is now on lantus 40units. Plan to inc the glipizide to 44m in the AM and 577min the evening. Will f/u in 1 month and check A1c at that time.    Her updated medication list for this problem includes:    Glipizide 10 Mg Tabs (Glipizide) ...Marland Kitchen. Take 1 tab in the morning and 1/2 tablet by mouth in the evening     Lisinopril-hydrochlorothiazide 20-12.5 Mg Tabs (Lisinopril-hydrochlorothiazide) ...Marland Kitchen. 1 tab by mouth daily    Lantus Solostar 100 Unit/ml Soln (Insulin glargine) ...Marland KitchenMarland KitchenMarland KitchenMarland Kitchen0 units daily   disp: 2 box  [place]ders: [facility]  [place] 4 ([address]214)  Problem # 3:  OBESITY, NOS (ICD-278.00) Assessment: Unchanged Wt unchanged according to our scales. Counseling regarding exercise and nutrition provided.  Orders: [facility]  Level 4 (99214)  Complete Medication List: 1)  Claritin 10 Mg Tabs (Loratadine) .... Take 1 tablet by mouth once a day 2)  Flonase 50 Mcg/act Susp (Fluticasone propionate) .... Spray 2 spray into both nostrils twice a day 3)  Glipizide 10 Mg Tabs (Glipizide) .... Take 1 tab in the morning and 1/2 tablet by mouth in the evening 4)  Onetouch Basic System W/device Kit (Blood glucose monitoring suppl) .... Use 1 strip as directed once a day 5)  Lisinopril-hydrochlorothiazide 20-12.5 Mg Tabs (Lisinopril-hydrochlorothiazide) ...Marland Kitchen 1 tab by mouth daily 6)  Onetouch Ultra Test Strp (Glucose blood) .... Test blood sugar 3 times a day 7)  Lantus Solostar 100 Unit/ml Soln (Insulin glargine) .... 40 units daily   disp: 2 box 8)  Lipitor 20 Mg Tabs (Atorvastatin calcium) ...Marland Kitchen 1 tab by mouth at bedtime 9)  Klor-con 20 Meq Pack (Potassium chloride) .... One tablet by mouth daily for low potassium 10)  Gabapentin 300 Mg Caps (Gabapentin) ...Marland Kitchen 1 tab by mouth at bedtime x 3 days, then two times a day x 3 days, then three times a day x 3 days  Patient Instructions: 1)  Please schedule a follow-up appointment in 1 month to reassess blood pressure and diabetes. 2)  Keep track of your blood pressure and blood sugar. 3)  We increased the glipizide to 1 tab in the morning and 1/2 tab at night.   Prevention & Chronic Care Immunizations   Influenza vaccine: Fluvax Non-MCR  (11/29/2008)    Tetanus booster: 04/05/2001: Done.    Pneumococcal vaccine: Not documented  Other Screening   Pap smear: NEGATIVE FOR  INTRAEPITHELIAL LESIONS OR MALIGNANCY.  (10/28/2007)   Pap smear action/deferral: Deferred  (04/11/2009)   Smoking status: never  (06/21/2009)  Diabetes Mellitus   HgbA1C: 10.0  (04/11/2009)   HgbA1C action/deferral: Ordered  (04/11/2009)    Eye exam: Not documented   Diabetic eye exam action/deferral: Ophthalmology referral  (05/19/2009)    Foot exam: yes  (05/19/2009)   Foot exam action/deferral: Do today   High risk foot: Not documented   Foot care education: Not documented    Urine microalbumin/creatinine ratio: Not documented    Diabetes flowsheet reviewed?: Yes   Progress toward A1C goal: Unchanged  Lipids   Total  Cholesterol: 128  (04/11/2009)   Lipid panel action/deferral: LDL Direct Ordered   LDL: 63  (04/11/2009)   LDL Direct: Not documented   HDL: 41  (04/11/2009)   Triglycerides: 119  (04/11/2009)    SGOT (AST): 12  (04/11/2009)   BMP action: Ordered   SGPT (ALT): <8 U/L  (04/11/2009)   Alkaline phosphatase: 81  (04/11/2009)   Total bilirubin: 0.3  (04/11/2009)  Hypertension   Last Blood Pressure: 150 / 105  (06/21/2009)   Serum creatinine: 0.79  (04/11/2009)   BMP action: Ordered   Serum potassium 3.4  (04/11/2009)    Hypertension flowsheet reviewed?: Yes   Progress toward BP goal: Unchanged  Self-Management Support :   Personal Goals (by the next clinic visit) :     Personal A1C goal: 8  (11/29/2008)     Personal blood pressure goal: 140/90  (11/29/2008)     Personal LDL goal: 100  (11/29/2008)    Patient will work on the following items until the next clinic visit to reach self-care goals:     Medications and monitoring: take my medicines every day, check my blood sugar, check my blood pressure, bring all of my medications to every visit  (06/21/2009)     Eating: drink diet soda or water instead of juice or soda, eat more vegetables, use fresh or frozen vegetables, eat foods that are low in salt, eat baked foods instead of fried foods, eat fruit  for snacks and desserts, limit or avoid alcohol  (06/21/2009)     Activity: take a 30 minute walk every day  (11/29/2008)    Diabetes self-management support: Copy of home glucose meter record, CBG self-monitoring log, Written self-care plan, Education handout  (06/21/2009)   Diabetes care plan printed   Diabetes education handout printed    Hypertension self-management support: BP self-monitoring log, Written self-care plan, Education handout  (06/21/2009)   Hypertension self-care plan printed.   Hypertension education handout printed    Lipid self-management support: Written self-care plan  (05/19/2009)

## 2010-03-09 NOTE — Assessment & Plan Note (Signed)
Summary: f/u chronic issues   Vital Signs:  Patient profile:   40 year old female Weight:      248 pounds BMI:     36.76 Temp:     98.2 degrees F oral Pulse rate:   97 / minute Pulse rhythm:   regular BP sitting:   144 / 95  (left arm) Cuff size:   large  Vitals Entered By: Audelia Hives CMA (April 11, 2009 1:42 PM)  Serial Vital Signs/Assessments:  Time      Position  BP       Pulse  Resp  Temp     By                     146/98                         Dion Body  MD  Comments: sitting, right arm By: Dion Body  MD   CC: wellness visit Comments pain in abdomen feels like someone kicked me, and right flank pain x years   Primary Care Provider:  Dion Body  MD  CC:  wellness visit.  History of Present Illness: 40yo F here for wellness visit  Concerns: Chronic (2-3 yrs) R inguinal pain/flank pain. Constant achy pain.  No bladder or bowel incontinence.  No hx of trauma.  Not worsened with heavy lifting or valsalva.  No fevers or chills.  No N/V.  No hematuria, dysuria, vaginal bleeding or discharge associated.    HTN: No adverse effects from medication.  Not checking it regularly.  Has been out of meds x 1 wk.  No dizziness, HA, CP, palpitations, or swelling.  DM:  Currently on Lantus 35units in the AM,  and Glipizide.  No adverse effects.  No hypoglycemic events, lowest CBG has been 97 at lunch time.  Mild paresthesia present in fingers.  CBGs checked 1time a day and typically avg around 170s fasting. Due for diabetic eye exam.  Obesity: Gained 18lbs since Nov 2010.  Eating more with inc appetite.  Preventative: Not due for another pap smear or mammogram until next year.  Up to date on flu shot.      Current Medications (verified): 1)  Claritin 10 Mg Tabs (Loratadine) .... Take 1 Tablet By Mouth Once A Day 2)  Flonase 50 Mcg/act Susp (Fluticasone Propionate) .... Spray 2 Spray Into Both Nostrils Twice A Day 3)  Glipizide 10 Mg Tabs (Glipizide)  .... Take 1/2 Tablet By Mouth Twice A Day 4)  Onetouch Basic System W/device Kit (Blood Glucose Monitoring Suppl) .... Use 1 Strip As Directed Once A Day 5)  Benazepril-Hydrochlorothiazide 20-25 Mg Tabs (Benazepril-Hydrochlorothiazide) .... One Tablet By Mouth Daily For Blood Pressure 6)  Onetouch Ultra Test  Strp (Glucose Blood) .... Test Blood Sugar 3 Times A Day 7)  Lantus Solostar 100 Unit/ml Soln (Insulin Glargine) .... Start At 35 Units Daily and Increase By 1 Unit Each Day Until Your Morning Blood Sugar Is Less Than 125 Disp: 2 Box 8)  Simvastatin 40 Mg Tabs (Simvastatin) .... One Tablet By Mouth At Bedtime For 2 Weeks and Then Increase To 2 Tablets At Bedtime 9)  Klor-Con 20 Meq Pack (Potassium Chloride) .... One Tablet By Mouth Daily For Low Potassium  Allergies (verified): 1)  Amoxicillin (Amoxicillin)  Past History:  Past Medical History: HTN IDDM HLD Obesity  Family History: Mom 22 w/ DM, HTN, heart dz - recently died.  Grandmother died 20 of DM grandfather died in 46`s unknown cause.   No breast ca, utx ca, ovarian ca, colon ca. No MI or CVA.  Social History: Lives w/  dtr Alexi Unemployed Not sexually active  No alcohol/drugs/tob.  Best contact #[phone] (Daughter's cell, Ubaldo Glassing- CNA)  Review of Systems       No fevers or chills.  No N/V.  No hematuria, dysuria, vaginal bleeding or discharge associated.   No dizziness, HA, CP, palpitations, or swelling.  Physical Exam  General:  VS Reviewed. Obese, Well appearing, NAD.  Mouth:  moist mucus membranes no signs of dental decay Neck:  supple, full ROM, no goiter or mass  Lungs:  Normal respiratory effort, chest expands symmetrically. Lungs are clear to auscultation, no crackles or wheezes. Heart:  Normal rate and regular rhythm. S1 and S2 normal without gallop, murmur, click, rub or other extra sounds. Abdomen:  Soft, NT, ND, no HSM, active BS  Genitalia:  Pelvic exam: No cervical motion tenderness, no  adnexal mass, uterus difficult to assess given adipose tissue Msk:  No palpable hernia No bruising, erythema, or edema No deformities of the hip and no lack of ROM  Extremities:  no edema Neurologic:  no focal deficits Skin:  nl color and turgor  Diabetes Management Exam:    Foot Exam (with socks and/or shoes not present):       Sensory-Monofilament:          Left foot: normal          Right foot: normal       Inspection:          Left foot: normal          Right foot: normal       Nails:          Left foot: normal          Right foot: normal   Impression & Recommendations:  Problem # 1:  INGUINAL PAIN, RIGHT (ICD-789.09) Assessment Deteriorated Chronic issue x 2-3 years. Unclear of etiology.  Exam is benign. MSK vs. gyn.  I'm sure her obesity is contributing to the condition. Will check a pelvic U/S to assess ovary and r/o hernia.  Orders: Ultrasound (Ultrasound) Urinalysis-[facility] (00000) [place]- Est  Level 4 ([address])  Problem # 2:  HYPERLIPIDEMIA (ICD-272.4) Assessment: Unchanged Check LDL and CMET to evaluate LFTs since inc the simvastatin to 41m daily.  Her updated medication list for this problem includes:    Simvastatin 40 Mg Tabs (Simvastatin) ..... One tablet by mouth at bedtime for 2 weeks and then increase to 2 tablets at bedtime  Orders: T-Lipoprotein (LDL cholesterol)  (([id number] [place]  Level 4 (([address]  Problem # 3:  OBESITY, NOS (ICD-278.00) Assessment: Deteriorated Gained 18lbs since Nov 2010. Discussed healthy food choices and physical activity.  Problem # 4:  HYPERTENSION, BENIGN SYSTEMIC (ICD-401.1) Assessment: Deteriorated Acute elevated.  Not at goal (<140/90). Has been out of meds x 1 wk...likely contributing to reading. Will reasses in 1 month.  Her updated medication list for this problem includes:    Benazepril-hydrochlorothiazide 20-25 Mg Tabs (Benazepril-hydrochlorothiazide) ..... One tablet by mouth daily for blood  pressure  Orders: [place]  Level 4 ([address])  Problem # 5:  IDDM (ICD-250.01) Assessment: Unchanged Check A1c today. Continue to titrate the Lantus until at goal of fasting 120 or less. Will f/u more frequently to address dm.   f/u in 1 month. Referral for eye exam placed today.  Her  updated medication list for this problem includes:    Glipizide 10 Mg Tabs (Glipizide) .Marland Kitchen... Take 1/2 tablet by mouth twice a day    Benazepril-hydrochlorothiazide 20-25 Mg Tabs (Benazepril-hydrochlorothiazide) ..... One tablet by mouth daily for blood pressure    Lantus Solostar 100 Unit/ml Soln (Insulin glargine) ..... Start at 35 units daily and increase by 1 unit each day until your morning blood sugar is less than 125 disp: 2 box  Orders: A1C-[facility] (01779) [facility]- Est  Level 4 (99214)  Problem # 6:  HYPOKALEMIA, MILD (ICD-276.8) Assessment: Unchanged Check K level today.  Problem # 7:  Preventive Health Care (ICD-V70.0) Assessment: Comment Only Last 3 pap smears nl.  Next pap smear in 1 yr. Mammogram due in 36yr  Complete Medication List: 1)  Claritin 10 Mg Tabs (Loratadine) .... Take 1 tablet by mouth once a day 2)  Flonase 50 Mcg/act Susp (Fluticasone propionate) .... Spray 2 spray into both nostrils twice a day 3)  Glipizide 10 Mg Tabs (Glipizide) .... Take 1/2 tablet by mouth twice a day 4)  Onetouch Basic System W/device Kit (Blood glucose monitoring suppl) .... Use 1 strip as directed once a day 5)  Benazepril-hydrochlorothiazide 20-25 Mg Tabs (Benazepril-hydrochlorothiazide) .... One tablet by mouth daily for blood pressure 6)  Onetouch Ultra Test Strp (Glucose blood) .... Test blood sugar 3 times a day 7)  Lantus Solostar 100 Unit/ml Soln (Insulin glargine) .... Start at 35 units daily and increase by 1 unit each day until your morning blood sugar is less than 125 disp: 2 box 8)  Simvastatin 40 Mg Tabs (Simvastatin) .... One tablet by mouth at bedtime for 2 weeks and then increase to 2  tablets at bedtime 9)  Klor-con 20 Meq Pack (Potassium chloride) .... One tablet by mouth daily for low potassium  Other Orders: T-Comprehensive Metabolic Panel ([id number]  Patient Instructions: 1)  Follow up in 1 month to discuss diabetes. 2)  I'm setting you up for an ultrasound to evaluate your complaints. Prescriptions: SIMVASTATIN 40 MG TABS (SIMVASTATIN) one tablet by mouth at bedtime for 2 weeks and then increase to 2 tablets at bedtime  #90 x 1   Entered and Authorized by:   KDion Body MD   Signed by:   KDion Body MD on 04/11/2009   Method used:   Faxed to ...       [facility](retail)       [address] [place]  [address]      Ph: [phone]      Fax: [phone]  RxID:   [id number]-HYDROCHLOROTHIAZIDE 20-25 MG TABS (BENAZEPRIL-HYDROCHLOROTHIAZIDE) one tablet by mouth daily for blood pressure  #90 x 1   Entered and Authorized by:   KDion Body MD   Signed by:   KDion Body MD on 04/11/2009   Method used:   Faxed to ...       [facility])       [address] [place]  [address]      Ph: [phone]      Fax: [phone]  RxID:   [id number] 10 MG TABS (GLIPIZIDE) Take 1/2 tablet by mouth twice a day  #90 x 0   Entered and Authorized by:   KDion Body MD   Signed by:   KDion Body MD on  04/11/2009   Method used:   Faxed to ...       [facility] (retail)       [place], [place]  [address]       Ph: [phone]       Fax: [phone]   RxID:   [id number] FLONASE 50 MCG/ACT SUSP (FLUTICASONE PROPIONATE) Spray 2 spray into both nostrils twice a day  #1 x 5   Entered and Authorized by:   Dion Body  MD   Signed by:   Dion Body  MD on 04/11/2009   Method used:   Faxed to ...       [facility] (retail)       [place], [place]  [address]       Ph: [phone]       Fax: [phone]   RxID:   [id number] CLARITIN 10 MG TABS (LORATADINE) Take 1 tablet by mouth once a day  #90 x 1   Entered and Authorized by:   Dion Body  MD   Signed by:   Dion Body  MD on 04/11/2009   Method used:   Faxed to ...       [facility] (retail)       [place], [place]  [address]       Ph: [phone]       Fax: [phone]   RxID:   [id number] LANTUS SOLOSTAR 100 UNIT/ML SOLN (INSULIN GLARGINE) Start at 35 units daily and increase by 1 unit each day until your morning blood sugar is less than 125 Disp: 2 box  #1 x 1   Entered and Authorized by:   Dion Body  MD   Signed by:   Dion Body  MD on 04/11/2009   Method used:   Faxed to ...       [facility] (retail)       [place],   [address]       Ph: [phone]       Fax: [phone]   RxID:   [id number]   Prevention & Chronic Care Immunizations   Influenza vaccine: Fluvax Non-MCR  (11/29/2008)    Tetanus booster: 04/05/2001: Done.    Pneumococcal vaccine: Not documented  Other Screening   Pap smear: NEGATIVE FOR INTRAEPITHELIAL LESIONS OR MALIGNANCY.  (10/28/2007)   Pap smear action/deferral: Deferred  (04/11/2009)   Smoking status: never  (12/08/2008)  Diabetes Mellitus   HgbA1C: 10.0  (04/11/2009)   HgbA1C action/deferral: Ordered  (04/11/2009)    Eye exam: Not documented   Diabetic eye exam action/deferral: Ophthalmology referral  (04/11/2009)    Foot exam: yes  (04/11/2009)   Foot exam action/deferral: Do today   High risk foot: Not documented   Foot care education: Not documented    Urine microalbumin/creatinine ratio: Not documented    Diabetes flowsheet reviewed?: Yes   Progress toward A1C goal: Unchanged  Lipids   Total Cholesterol: 252  (11/29/2008)   Lipid panel action/deferral:  LDL Direct Ordered   LDL: 171  (11/29/2008)   LDL Direct: Not documented   HDL: 43  (11/29/2008)   Triglycerides: 192  (11/29/2008)    SGOT (AST): 11  (11/29/2008)   BMP action: Ordered   SGPT (ALT): 13  (11/29/2008) CMP ordered  Alkaline phosphatase: 104  (11/29/2008)   Total bilirubin: 0.5  (11/29/2008)    Lipid flowsheet reviewed?: Yes   Progress toward LDL goal: Unchanged  Hypertension   Last Blood Pressure: 144 / 95  (04/11/2009)   Serum creatinine: 0.80  (11/29/2008)   BMP action: Ordered   Serum potassium 3.5  (11/29/2008) CMP ordered     Hypertension flowsheet reviewed?: Yes   Progress toward BP goal: Deteriorated  Self-Management Support :   Personal Goals (by the next clinic visit) :     Personal A1C goal: 8  (11/29/2008)     Personal blood pressure goal: 140/90  (11/29/2008)     Personal LDL goal: 100  (11/29/2008)    Patient will work on the following items until the next clinic visit to reach self-care goals:     Medications and monitoring: take my medicines every day, check my blood sugar, check my blood pressure  (04/11/2009)     Eating: drink diet soda or water instead of juice or soda, eat more vegetables, use fresh or frozen vegetables, eat foods that are low in salt, eat baked foods instead of fried foods, eat fruit for snacks and desserts, limit or avoid alcohol  (04/11/2009)     Activity: take a 30 minute walk every day  (11/29/2008)    Diabetes self-management support: CBG self-monitoring log, Written self-care plan, Education handout  (04/11/2009)   Diabetes care plan printed   Diabetes education handout printed    Hypertension self-management support: CBG self-monitoring log, Written self-care plan  (04/11/2009)   Hypertension self-care plan printed.    Lipid self-management support: Written self-care plan  (04/11/2009)   Lipid self-care plan printed.   Nursing Instructions: HgbA1C today (see order) Refer for screening diabetic eye exam (see  order)    Prevention & Chronic Care Immunizations   Influenza vaccine: Fluvax Non-MCR  (11/29/2008)    Tetanus booster: 04/05/2001: Done.    Pneumococcal vaccine: Not documented  Other Screening   Pap smear: NEGATIVE FOR INTRAEPITHELIAL LESIONS OR MALIGNANCY.  (10/28/2007)   Pap smear action/deferral: Deferred  (04/11/2009)   Smoking status: never  (12/08/2008)  Diabetes Mellitus   HgbA1C: 10.0  (04/11/2009)   HgbA1C action/deferral: Ordered  (04/11/2009)    Eye exam: Not documented   Diabetic eye exam action/deferral: Ophthalmology referral  (04/11/2009)    Foot exam: yes  (04/11/2009)   Foot exam action/deferral: Do today   High risk foot: Not documented   Foot care education: Not documented    Urine microalbumin/creatinine ratio: Not documented    Diabetes flowsheet reviewed?: Yes   Progress toward A1C goal: Unchanged  Lipids   Total Cholesterol: 252  (11/29/2008)   Lipid panel action/deferral: LDL Direct Ordered   LDL: 171  (11/29/2008)   LDL Direct: Not documented   HDL: 43  (11/29/2008)   Triglycerides: 192  (11/29/2008)    SGOT (AST): 11  (11/29/2008)   BMP action: Ordered   SGPT (ALT): 13  (11/29/2008) CMP ordered    Alkaline phosphatase: 104  (11/29/2008)   Total bilirubin: 0.5  (11/29/2008)    Lipid flowsheet reviewed?: Yes   Progress toward LDL goal: Unchanged  Hypertension   Last Blood Pressure: 144 / 95  (04/11/2009)   Serum creatinine: 0.80  (11/29/2008)   BMP action: Ordered   Serum potassium 3.5  (11/29/2008) CMP ordered     Hypertension flowsheet reviewed?: Yes   Progress toward BP goal: Deteriorated  Self-Management Support :   Personal Goals (by the next  clinic visit) :     Personal A1C goal: 8  (11/29/2008)     Personal blood pressure goal: 140/90  (11/29/2008)     Personal LDL goal: 100  (11/29/2008)    Patient will work on the following items until the next clinic visit to reach self-care goals:     Medications and  monitoring: take my medicines every day, check my blood sugar, check my blood pressure  (04/11/2009)     Eating: drink diet soda or water instead of juice or soda, eat more vegetables, use fresh or frozen vegetables, eat foods that are low in salt, eat baked foods instead of fried foods, eat fruit for snacks and desserts, limit or avoid alcohol  (04/11/2009)     Activity: take a 30 minute walk every day  (11/29/2008)    Diabetes self-management support: CBG self-monitoring log, Written self-care plan, Education handout  (04/11/2009)   Diabetes care plan printed   Diabetes education handout printed    Hypertension self-management support: CBG self-monitoring log, Written self-care plan  (04/11/2009)   Hypertension self-care plan printed.    Lipid self-management support: Written self-care plan  (04/11/2009)   Lipid self-care plan printed.    Laboratory Results   Urine Tests  Date/Time Received: April 11, 2009 2:40 PM  Date/Time Reported: April 11, 2009 2:43 PM   Routine Urinalysis   Color: yellow Appearance: Clear Glucose: negative   (Normal Range: Negative) Bilirubin: negative   (Normal Range: Negative) Ketone: negative   (Normal Range: Negative) Spec. Gravity: >=1.030   (Normal Range: 1.003-1.035) Blood: negative   (Normal Range: Negative) pH: 5.5   (Normal Range: 5.0-8.0) Protein: trace   (Normal Range: Negative) Urobilinogen: 2.0   (Normal Range: 0-1) Nitrite: negative   (Normal Range: Negative) Leukocyte Esterace: negative   (Normal Range: Negative)    Comments: ...........test performed by...........Marland KitchenHedy Camara, CMA   Blood Tests   Date/Time Received: April 11, 2009 2:40 PM  Date/Time Reported: April 11, 2009 2:52 PM   HGBA1C: 10.0%   (Normal Range: Non-Diabetic - 3-6%   Control Diabetic - 6-8%)  Comments: ...........test performed by...........Marland KitchenHedy Camara, CMA

## 2010-03-09 NOTE — Progress Notes (Signed)
Summary: Ref Req  Phone Note Call from Patient Call back at Home Phone [phone]   Caller: Patient Summary of Call: Pt wants referral to eye doctor and ear,nose and throat doctor. Initial call taken by: Raymond Gurney,  August 26, 2009 2:37 PM  Follow-up for Phone Call        called pt. we will re-send the referral for the eye exam through [facility]. but, she has to see ENT doc at her own expense. no volunteers at this time. pt verbalized understanding. Follow-up by: Mauricia Area CMA,,  August 26, 2009 4:38 PM

## 2010-03-09 NOTE — Progress Notes (Signed)
Summary: triage  Phone Note Call from Patient Call back at Home Phone [phone]   Caller: Patient Summary of Call: Pt checking to see when she had last HIV test? Initial call taken by: Raymond Gurney,  September 05, 2009 3:08 PM  Follow-up for Phone Call        states she only has notice that she was negative in 2007. says she is [facility] dept today for her tb chest xray & is going to have the HIV test done while she is there Follow-up by: Elige Radon RN,  September 05, 2009 3:19 PM  Additional Follow-up for Phone Call Additional follow up Details #1::        agree with plan Additional Follow-up by: Annabell Sabal MD,  September 05, 2009 5:03 PM

## 2010-03-09 NOTE — Miscellaneous (Signed)
Summary: medication changes due to MAP coverage  Clinical Lists Changes  Medications: Changed medication from SIMVASTATIN 40 MG TABS (SIMVASTATIN) one tablet by mouth at bedtime for 2 weeks and then increase to 2 tablets at bedtime to LIPITOR 20 MG TABS (ATORVASTATIN CALCIUM) 1 tab by mouth at bedtime - Signed Changed medication from BENAZEPRIL-HYDROCHLOROTHIAZIDE 20-25 MG TABS (BENAZEPRIL-HYDROCHLOROTHIAZIDE) one tablet by mouth daily for blood pressure to ACCURETIC 10-12.5 MG TABS (QUINAPRIL-HYDROCHLOROTHIAZIDE) 1 tab by mouth daily - Signed Rx of LIPITOR 20 MG TABS (ATORVASTATIN CALCIUM) 1 tab by mouth at bedtime;  #90 x 1;  Signed;  Entered by: Dion Body  MD;  Authorized by: Dion Body  MD;  Method used: Faxed to [facility], [address] [place], [place], [place]  [address], Ph: [phone], Fax: [phone] Rx of ACCURETIC 10-12.5 MG TABS (QUINAPRIL-HYDROCHLOROTHIAZIDE) 1 tab by mouth daily;  #90 x 1;  Signed;  Entered by: Dion Body  MD;  Authorized by: Dion Body  MD;  Method used: Faxed to [facility], [address] [place], [place], [place]  [address], Ph: [phone], Fax: [phone]    Prescriptions: ACCURETIC 10-12.5 MG TABS (QUINAPRIL-HYDROCHLOROTHIAZIDE) 1 tab by mouth daily  #90 x 1   Entered and Authorized by:   Dion Body  MD   Signed by:   Dion Body  MD on 04/18/2009   Method used:   Faxed to ...       [facility] (retail)       [address] [place]       [place], [place]  [address]       Ph: [phone]       Fax: [phone]   RxID:   [id number] LIPITOR 20 MG TABS (ATORVASTATIN CALCIUM) 1 tab by mouth at bedtime  #90 x 1   Entered and Authorized by:   Dion Body  MD   Signed by:   Dion Body  MD on 04/18/2009   Method used:   Faxed to ...       [facility] (retail)       [address] [place]       [place],   [address]       Ph: [phone]       Fax: [phone]   RxID:   [id number]

## 2010-03-10 ENCOUNTER — Telehealth: Payer: Self-pay | Admitting: Family Medicine

## 2010-03-23 NOTE — Progress Notes (Signed)
Summary: referral  Phone Note Call from Patient Call back at [phone]   Caller: Patient Summary of Call: needs a referral to go to [facility] for a procedure - she was there last Aug (see office notes) and was told to come back this month Initial call taken by: Audie Clear,  March 10, 2010 8:45 AM  Follow-up for Phone Call        forwarded to pcp Follow-up by: Audelia Hives CMA,  March 10, 2010 9:08 AM

## 2010-04-17 LAB — GLUCOSE, CAPILLARY: Glucose-Capillary: 161 mg/dL — ABNORMAL HIGH (ref 70–99)

## 2010-05-18 LAB — COMPREHENSIVE METABOLIC PANEL
AST: 23 U/L (ref 0–37)
BUN: 4 mg/dL — ABNORMAL LOW (ref 6–23)
Calcium: 9.1 mg/dL (ref 8.4–10.5)
Chloride: 95 mEq/L — ABNORMAL LOW (ref 96–112)
GFR calc non Af Amer: >60 mL/min (ref >60)
Sodium: 131 mEq/L — ABNORMAL LOW (ref 135–145)
Total Bilirubin: 0.6 mg/dL (ref 0.3–1.2)
Total Protein: 7.9 g/dL (ref 6.0–8.3)

## 2010-05-18 LAB — URINE MICROSCOPIC-ADD ON

## 2010-05-18 LAB — DIFFERENTIAL
Basophils Absolute: 0.1 10*3/uL (ref 0.0–0.1)
Eosinophils Absolute: 0.2 10*3/uL (ref 0.0–0.7)
Eosinophils Relative: 3 % (ref 0–5)
Lymphocytes Relative: 33 % (ref 12–46)
Lymphs Abs: 2.9 10*3/uL (ref 0.7–4.0)
Monocytes Absolute: 0.3 10*3/uL (ref 0.1–1.0)
Monocytes Relative: 4 % (ref 3–12)

## 2010-05-18 LAB — CBC
MCV: 83.7 fL (ref 78.0–100.0)
Platelets: 453 10*3/uL — ABNORMAL HIGH (ref 150–400)

## 2010-05-18 LAB — POCT CARDIAC MARKERS: Troponin i, poc: <0.05 ng/mL (ref 0.00–0.09)

## 2010-05-18 LAB — URINALYSIS, ROUTINE W REFLEX MICROSCOPIC
Leukocytes, UA: NEGATIVE
Nitrite: NEGATIVE
Protein, ur: NEGATIVE mg/dL
Specific Gravity, Urine: 1.014 (ref 1.005–1.030)
Urobilinogen, UA: 0.2 mg/dL (ref 0.0–1.0)

## 2010-05-18 LAB — LIPASE, BLOOD: Lipase: 19 U/L (ref 11–59)

## 2010-07-10 ENCOUNTER — Encounter: Payer: Self-pay | Admitting: Family Medicine

## 2010-07-10 ENCOUNTER — Ambulatory Visit (INDEPENDENT_AMBULATORY_CARE_PROVIDER_SITE_OTHER): Payer: Self-pay | Admitting: Family Medicine

## 2010-07-10 VITALS — BP 154/103 | HR 95 | Temp 98.4°F | Ht 69.0 in | Wt 252.3 lb

## 2010-07-10 DIAGNOSIS — IMO0001 Reserved for inherently not codable concepts without codable children: Secondary | ICD-10-CM

## 2010-07-10 DIAGNOSIS — M25561 Pain in right knee: Secondary | ICD-10-CM

## 2010-07-10 DIAGNOSIS — M25569 Pain in unspecified knee: Secondary | ICD-10-CM

## 2010-07-10 LAB — POCT GLYCOSYLATED HEMOGLOBIN (HGB A1C): Hemoglobin A1C: 10.3

## 2010-07-10 MED ORDER — INSULIN GLARGINE 100 UNIT/ML ~~LOC~~ SOLN: 50.0000 [IU] | Freq: Every day | SUBCUTANEOUS | Status: DC

## 2010-07-10 NOTE — Patient Instructions (Signed)
When you leave today, on your way out, set up a lab appointment in the morning.  Do not eat beforehand.   Lant[facility]  I'll send a referral back to the same eye doctor. On your way out, make another appt to see the doctors at [facility] in 1-2 weeks.   Make sure you get your x-rays. Take the Ibuprofen up to 3 times a day.

## 2010-07-11 ENCOUNTER — Encounter: Payer: Self-pay | Admitting: Family Medicine

## 2010-07-11 DIAGNOSIS — M25561 Pain in right knee: Secondary | ICD-10-CM | POA: Insufficient documentation

## 2010-07-11 NOTE — Assessment & Plan Note (Signed)
Will refill Lantus.   Emphasized need to continue adherence. May need Pharm clinic referral for help with adherence, understanding.  Will refer to Ophtho.

## 2010-07-11 NOTE — Progress Notes (Signed)
  Subjective:    Patient ID: Alicia West, female    DOB: July 14, 1970, 40 y.o.   MRN: [id number]  HPI 1.   Diabetes:  Currently on Glipizide, Lantus 50 units subQ.  Admits to not using daily.   No adverse effects.  No hypoglycemic events.  No paresthesia or peripheral nerve pain.  Measures blood sugars at home every: few days.    Lab Results  Component Value Date   HGBA1C 10.3 07/10/2010    2.  Ophthamology referral:  History diabetic nephropathy, has been seen by Ophtho in past, recommended for treatment but patient nervous at that time to have it done.  This was over a year ago, not seen since then.  Would like another referral.  Still with cloudiness at times in Left eye.    3.  Knee pain, RIGHT:  Hx/o Osgood-Schlatter's as a child.  Has had intermittent knee pain since then.  Pain for persisted for past several years and become more progressive.  Knee will give out on her 2-3 times a week for past several months.  Pain is mostly on tibial tuberosity, describes as sharp pain at times, otherwise aching pain with weather changes.  Has used heating pad alternated with ice with minimal relief.  Now taking 800 mg Ibuprofen once a day, not everyday, with intermittent relief.  No redness, paresthesias, numbness, fevers, chills.    Review of Systems See HPI above for review of systems.       Objective:   Physical Exam Gen:  Alert, cooperative patient who appears stated age in no acute distress.  Vital signs reviewed. HEENT:  [place]/AT.  EOMI, PERRL.  MMM, tonsils non-erythematous, non-edematous.  External ears WNL, Bilateral TM's normal without retraction, redness or bulging.  Cardiac:  Regular rate and rhythm without murmur auscultated.  Good S1/S2. Pulm:  Clear to auscultation bilaterally with good air movement.  No wheezes or rales noted.   Abd:  Soft/nondistended/nontender.  Good bowel sounds throughout all four quadrants.  No masses noted.  Ext:  No clubbing/cyanosis/erythema.  No edema noted  bilateral lower extremities.   MSK:  Moderate tenderness over tibial tuberosity.  No erythema/edema in knee.  No effusion noted.  No laxity.  Drawer tests negative.  5/5 strength.         Assessment & Plan:

## 2010-07-11 NOTE — Assessment & Plan Note (Signed)
Chronic pain, localized to tibial tuberosity.   No evidence of joint pain, laxity, tenderness on exam. Emphasized there will not be quick fix. Obtain radiographs due to point tenderness, rule out any fracture.   To use the Ibuprofen 800 mg TID for next several days.   Refer to Sports Medicine for further evaluation.

## 2010-07-12 ENCOUNTER — Other Ambulatory Visit: Payer: Self-pay | Admitting: Family Medicine

## 2010-07-12 DIAGNOSIS — E119 Type 2 diabetes mellitus without complications: Secondary | ICD-10-CM

## 2010-07-12 DIAGNOSIS — I10 Essential (primary) hypertension: Secondary | ICD-10-CM

## 2010-07-17 ENCOUNTER — Other Ambulatory Visit: Payer: Self-pay

## 2010-07-20 ENCOUNTER — Telehealth: Payer: Self-pay | Admitting: *Deleted

## 2010-07-20 ENCOUNTER — Ambulatory Visit (INDEPENDENT_AMBULATORY_CARE_PROVIDER_SITE_OTHER): Payer: Self-pay | Admitting: Family Medicine

## 2010-07-20 ENCOUNTER — Encounter: Payer: Self-pay | Admitting: Family Medicine

## 2010-07-20 ENCOUNTER — Other Ambulatory Visit: Payer: Self-pay

## 2010-07-20 DIAGNOSIS — M25561 Pain in right knee: Secondary | ICD-10-CM

## 2010-07-20 DIAGNOSIS — I10 Essential (primary) hypertension: Secondary | ICD-10-CM

## 2010-07-20 DIAGNOSIS — E119 Type 2 diabetes mellitus without complications: Secondary | ICD-10-CM

## 2010-07-20 DIAGNOSIS — M928 Other specified juvenile osteochondrosis: Secondary | ICD-10-CM

## 2010-07-20 DIAGNOSIS — M25569 Pain in unspecified knee: Secondary | ICD-10-CM

## 2010-07-20 DIAGNOSIS — M92529 Juvenile osteochondrosis of tibia tubercle, unspecified leg: Secondary | ICD-10-CM | POA: Insufficient documentation

## 2010-07-20 LAB — CBC
Hemoglobin: 12.5 g/dL (ref 12.0–15.0)
MCHC: 32.9 g/dL (ref 30.0–36.0)
RBC: 4.61 MIL/uL (ref 3.87–5.11)
WBC: 10.2 10*3/uL (ref 4.0–10.5)

## 2010-07-20 MED ORDER — MELOXICAM 15 MG PO TABS: 15.0000 mg | ORAL_TABLET | Freq: Every day | ORAL | Status: DC

## 2010-07-20 MED ORDER — KETOPROFEN POWD: Status: DC

## 2010-07-20 NOTE — Progress Notes (Signed)
Cbc,flp and cmp done today [facility] Alicia West

## 2010-07-20 NOTE — Telephone Encounter (Signed)
Spoke with patient and informed her of that I am going to send the ophthalmology referral to [facility] in today and that it may take a week or a little more to get back to her. She was fine with this.

## 2010-07-20 NOTE — Patient Instructions (Signed)
Please make an appointment for follow up in 4-6 weeks with [facility]. Take Mobic 53m (one tab daily with food 2 wks, then once a day as needed for pain).  Do not take ibuprofen or Advil while taking Mobic. Work on the exercises in the handout: Straight leg raises 3 sets of 30 without weight, Decline Eccentric squats 3 sets of 15 without weight Wear the strap on R knee daily. Ice R knee at the end of each day, 15-20 minutes.

## 2010-07-20 NOTE — Progress Notes (Signed)
  Subjective:    Patient ID: Alicia West, female    DOB: 03-Sep-1970, 40 y.o.   MRN: [id number]  HPI [name] presents to [facility] today for R knee pain that has worsened in the past 2 months.  She denies any recent injury.  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  She has been walking to and from the bus stop daily for the past month (about 5 min walk each way).  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  Pain is worse with kneeling. The R knee swells up on her sometimes at the end of the day.  She has been taking Ibuprofen 256m (3-4 tabs per day).  Pain often wakes her up at night.  She denies any pain in L knee.    Review of Systems Negative except in HPI     Objective:   Physical Exam GEN: nad, aox 3 MUSC: Observation: +swelling at tibial tubercle compared to L knee.  Palpation: + tenderness along tibial tubercle, esp on lateral aspect.  Tenderness continues distally along patella tendon.  No tenderness along joint line. ROM: full Lachman: negative Anterior/Posterior Drawer: negative McMurray: negative     Ultrasound Exam: R Pat Ten 0.363mL Pat Ten 0.4431m Assessment & Plan:

## 2010-07-20 NOTE — Assessment & Plan Note (Addendum)
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 Patellar tendon did not appear thickened. - Rx for Mobic given - May also use topical ketoprofen as needed - Instructed on home exercises including eccentric squats and straight leg raises. - Fitted with Body Helix slingshot to wear with activity - Ice at the end of the day. - Should try to avoid any activities including deep squats or kneeling for long periods of time - Followup 4-6 weeks for reevaluation, if no significant improvement may consider possibility of nitroglycerin protocol at that time

## 2010-07-20 NOTE — Assessment & Plan Note (Signed)
History of right-sided slaughter's as a child, now with adult sequelae - MSK ultrasound showing fragmentation of the tibial tubercle with surrounding edema - Plan as outlined above

## 2010-07-20 NOTE — Progress Notes (Signed)
  Subjective:    Patient ID: Alicia West, female    DOB: April 02, 1970, 40 y.o.   MRN: [id number]  Knee Pain   [name] presents to [facility] today for R knee pain that has worsened in the past 2 months. Was [name].  She denies any recent injury.  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  She has been walking to and from the bus stop daily for the past month (about 5 min walk each way).  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  Pain is worse with kneeling. The R knee swells up on her sometimes at the end of the day.  She has been taking Ibuprofen 243m (3-4 tabs per day).  Pain often wakes her up at night. No improvement with ice or heat She denies any pain in L knee.   Past medical history, allergies, medications, social history reviewed in the electronic record and as noted.   Review of Systems Negative except in HPI     Objective:   Physical Exam  GEN: nad, aox 3, obese SKIN: No rashes or lesions RESPIRATORY: 15 and unlabored MUSC: - Knee: R knee- Observation: +swelling at tibial tubercle compared to L knee.  Palpation: + tenderness along tibial tubercle, no joint line tenderness. Mild tenderness along distal patellar tendon. ROM: full Lachman: negative; neg varus/valgus stress testing Anterior/Posterior Drawer: negative McMurray: negative - Feet: pes planus bilaterally - Gait: no limp, no leg length difference NEURO: Sensation intact to light touch Vascular: Pulses 2+ and symmetric  Ultrasound Exam: Right knee Normal appearing tendon, no increased fluid in suprapatellar pouch. Normal appearing patellar tendon measuring 0.36cm. Tibial tubercle with obvious fragmentation and attachment of patellar tendon, surrounding hypoechoic fluid  consistent with edema. No signs of tearing within the patellar tendon. Normal-appearing medial and lateral meniscus. Left knee with normal-appearing tibial tubercle and patellar tendon measuring 0.44cm .  Images saved.  Assessment & Plan:

## 2010-07-21 LAB — COMPREHENSIVE METABOLIC PANEL
Albumin: 3.9 g/dL (ref 3.5–5.2)
Alkaline Phosphatase: 96 U/L (ref 39–117)
Calcium: 9.4 mg/dL (ref 8.4–10.5)
Chloride: 100 mEq/L (ref 96–112)
Glucose, Bld: 154 mg/dL — ABNORMAL HIGH (ref 70–99)
Potassium: 3.9 mEq/L (ref 3.5–5.3)
Sodium: 139 mEq/L (ref 135–145)
Total Protein: 7.3 g/dL (ref 6.0–8.3)

## 2010-07-21 LAB — LIPID PANEL
LDL Cholesterol: 99 mg/dL (ref 0–99)
Triglycerides: 144 mg/dL (ref <150)

## 2010-07-25 ENCOUNTER — Encounter: Payer: Self-pay | Admitting: Family Medicine

## 2010-07-26 ENCOUNTER — Inpatient Hospital Stay (INDEPENDENT_AMBULATORY_CARE_PROVIDER_SITE_OTHER)
Admission: RE | Admit: 2010-07-26 | Discharge: 2010-07-26 | Disposition: A | Discharge status: Home / Self Care | Payer: Self-pay | Source: Ambulatory Visit

## 2010-07-26 ENCOUNTER — Emergency Department (HOSPITAL_COMMUNITY)
Admission: EM | Admit: 2010-07-26 | Discharge: 2010-07-27 | Disposition: A | Discharge status: Home / Self Care | Payer: Self-pay | Attending: Emergency Medicine | Admitting: Emergency Medicine

## 2010-07-26 ENCOUNTER — Emergency Department (HOSPITAL_COMMUNITY): Payer: Self-pay

## 2010-07-26 DIAGNOSIS — E119 Type 2 diabetes mellitus without complications: Secondary | ICD-10-CM | POA: Insufficient documentation

## 2010-07-26 DIAGNOSIS — R109 Unspecified abdominal pain: Secondary | ICD-10-CM | POA: Insufficient documentation

## 2010-07-26 DIAGNOSIS — R300 Dysuria: Secondary | ICD-10-CM | POA: Insufficient documentation

## 2010-07-26 DIAGNOSIS — N760 Acute vaginitis: Secondary | ICD-10-CM | POA: Insufficient documentation

## 2010-07-26 DIAGNOSIS — R413 Other amnesia: Secondary | ICD-10-CM | POA: Insufficient documentation

## 2010-07-26 DIAGNOSIS — D259 Leiomyoma of uterus, unspecified: Secondary | ICD-10-CM | POA: Insufficient documentation

## 2010-07-26 DIAGNOSIS — B9689 Other specified bacterial agents as the cause of diseases classified elsewhere: Secondary | ICD-10-CM | POA: Insufficient documentation

## 2010-07-26 DIAGNOSIS — Z794 Long term (current) use of insulin: Secondary | ICD-10-CM | POA: Insufficient documentation

## 2010-07-26 DIAGNOSIS — M545 Low back pain, unspecified: Secondary | ICD-10-CM | POA: Insufficient documentation

## 2010-07-26 DIAGNOSIS — Z79899 Other long term (current) drug therapy: Secondary | ICD-10-CM | POA: Insufficient documentation

## 2010-07-26 DIAGNOSIS — I10 Essential (primary) hypertension: Secondary | ICD-10-CM | POA: Insufficient documentation

## 2010-07-26 DIAGNOSIS — A499 Bacterial infection, unspecified: Secondary | ICD-10-CM | POA: Insufficient documentation

## 2010-07-26 DIAGNOSIS — F99 Mental disorder, not otherwise specified: Secondary | ICD-10-CM

## 2010-07-26 LAB — DIFFERENTIAL
Basophils Relative: 0 % (ref 0–1)
Eosinophils Absolute: 0.3 10*3/uL (ref 0.0–0.7)
Neutrophils Relative %: 58 % (ref 43–77)

## 2010-07-26 LAB — POCT I-STAT, CHEM 8
Calcium, Ion: 1.24 mmol/L (ref 1.12–1.32)
HCT: 41 % (ref 36.0–46.0)
TCO2: 29 mmol/L (ref 0–100)

## 2010-07-26 LAB — GLUCOSE, CAPILLARY: Glucose-Capillary: 254 mg/dL — ABNORMAL HIGH (ref 70–99)

## 2010-07-26 LAB — CBC
MCH: 27.5 pg (ref 26.0–34.0)
Platelets: 420 10*3/uL — ABNORMAL HIGH (ref 150–400)
RBC: 4.61 MIL/uL (ref 3.87–5.11)
WBC: 9.1 10*3/uL (ref 4.0–10.5)

## 2010-07-26 LAB — WET PREP, GENITAL: Yeast Wet Prep HPF POC: NONE SEEN

## 2010-07-26 IMAGING — US US TRANSVAGINAL NON-OB
1 series · 14 of 25 positions shown · non-contrast
Comparison: [DATE].

CLINICAL DATA: Back and pelvic pain for 2 weeks.

TRANSABDOMINAL AND TRANSVAGINAL ULTRASOUND OF PELVIS
DOPPLER ULTRASOUND OF OVARIES
TECHNIQUE: Both transabdominal and transvaginal ultrasound
examinations of the pelvis were performed. Transabdominal technique
was performed for global imaging of the pelvis including uterus,
ovaries, adnexal regions, and pelvic cul-de-sac.
It was necessary to proceed with endovaginal exam following the
transabdominal exam to visualize the ovaries.
Color and duplex Doppler ultrasound was utilized to evaluate blood
flow to the ovaries.

[Series 1: us transvaginal non-ob · 0.28mm/px · 14 of 37 slices shown]
[im 1/37]
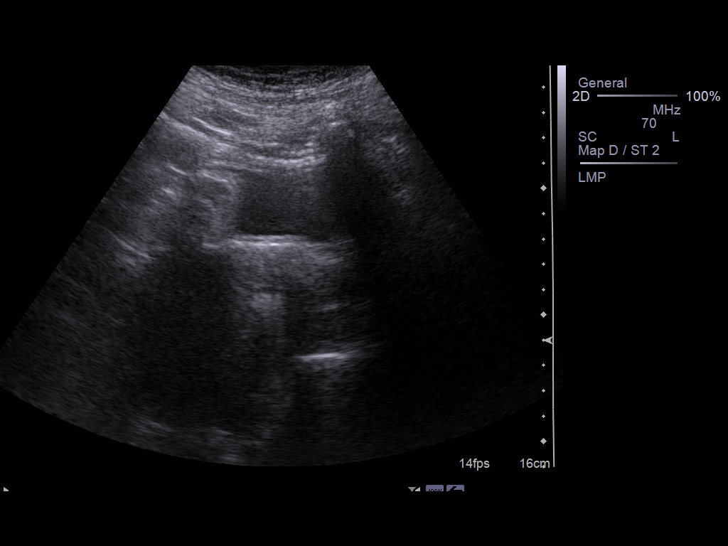
[im 4/37]
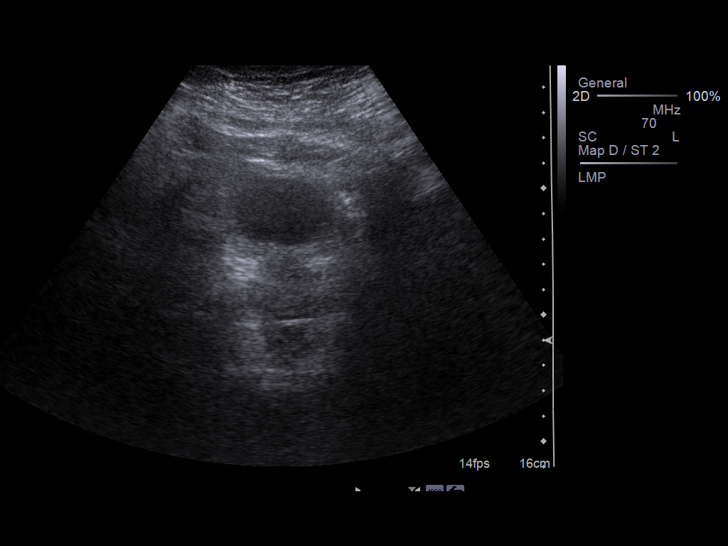
[im 7/37]
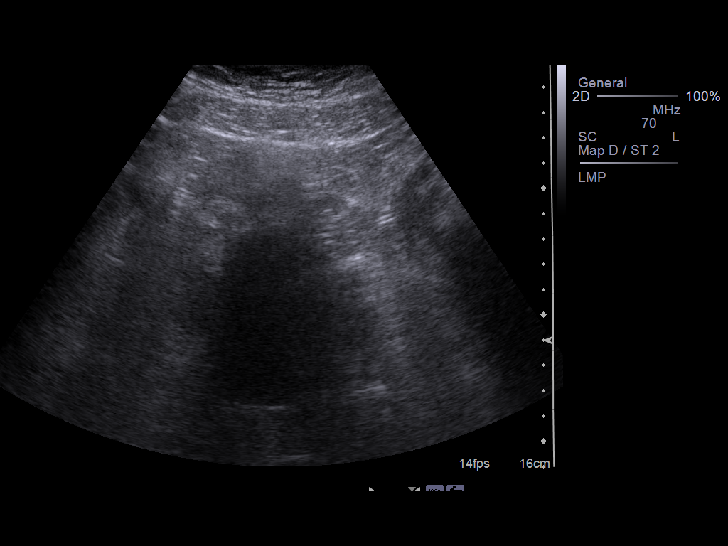
[im 10/37]
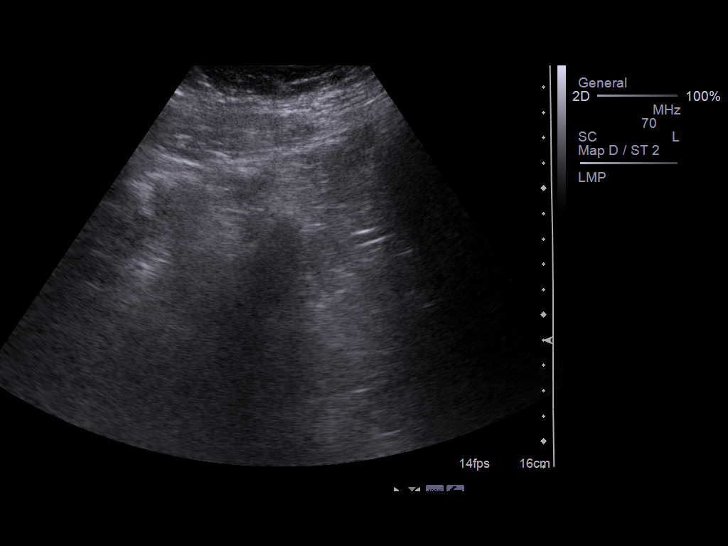
[im 13/37]
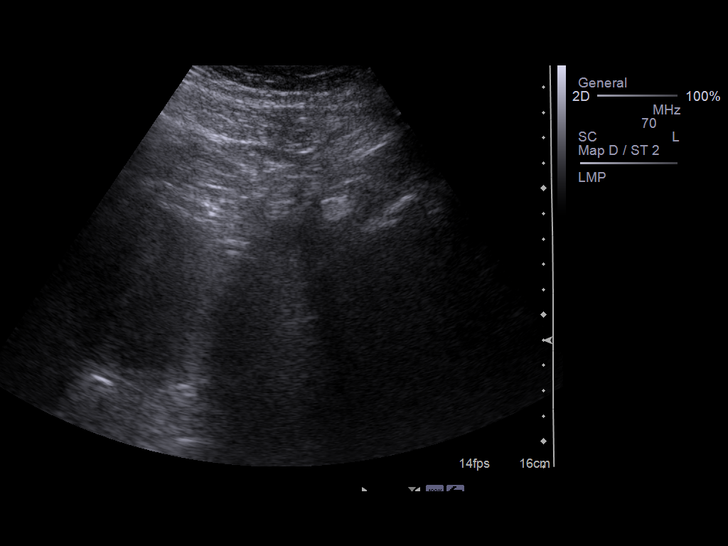
[im 14/37]
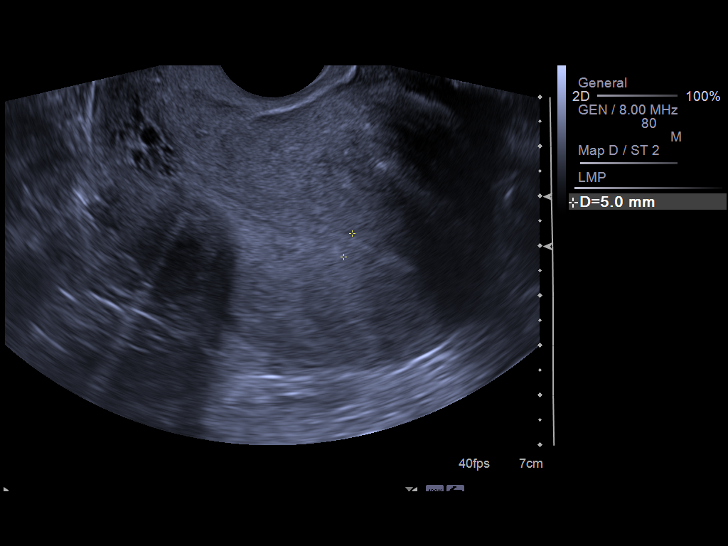
[im 17/37]
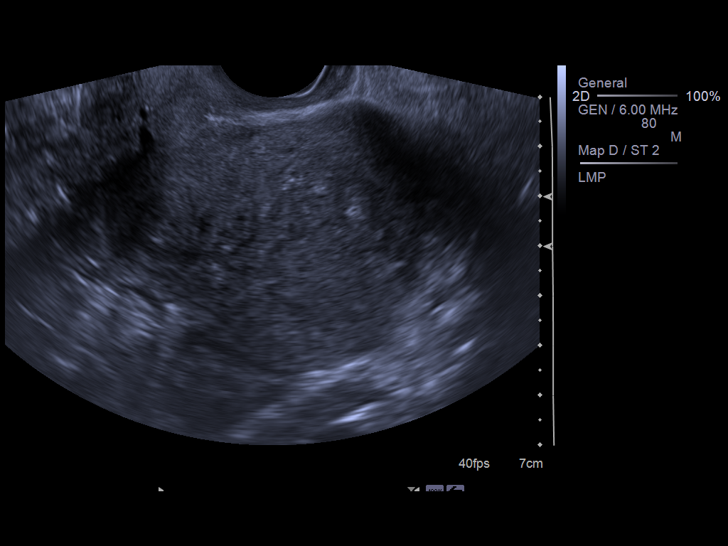
[im 20/37]
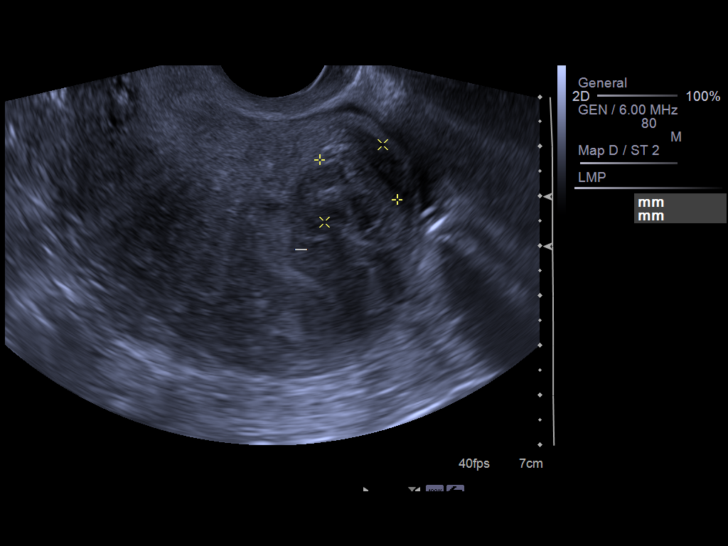
[im 23/37]
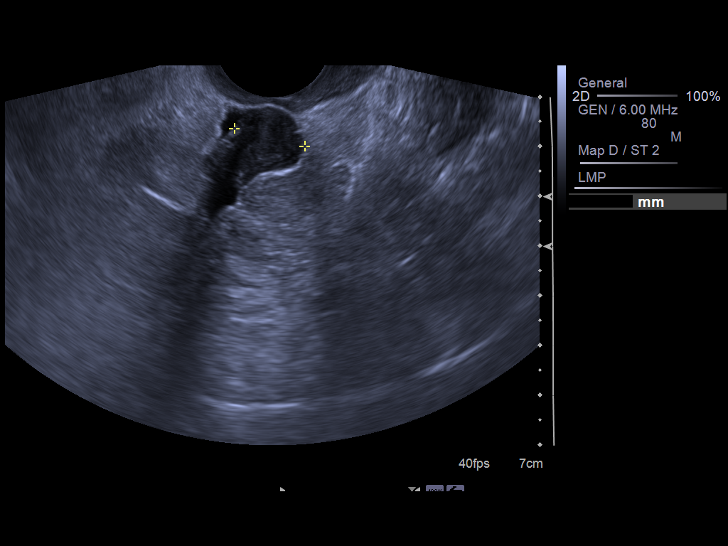
[im 25/37]
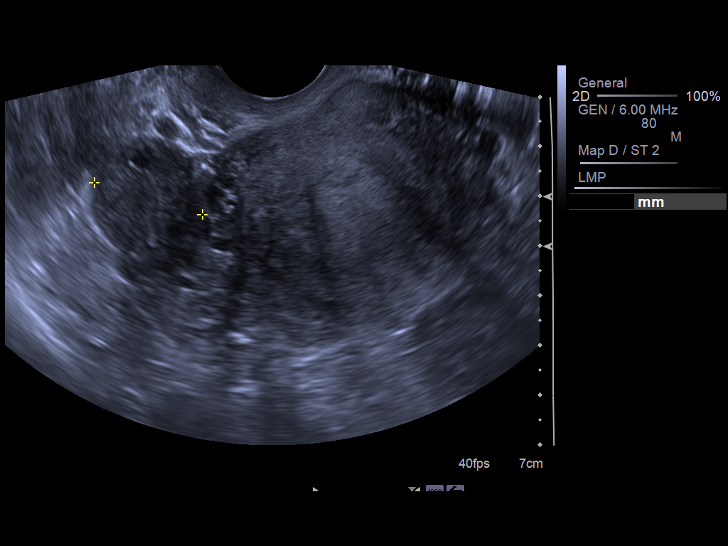
[im 28/37]
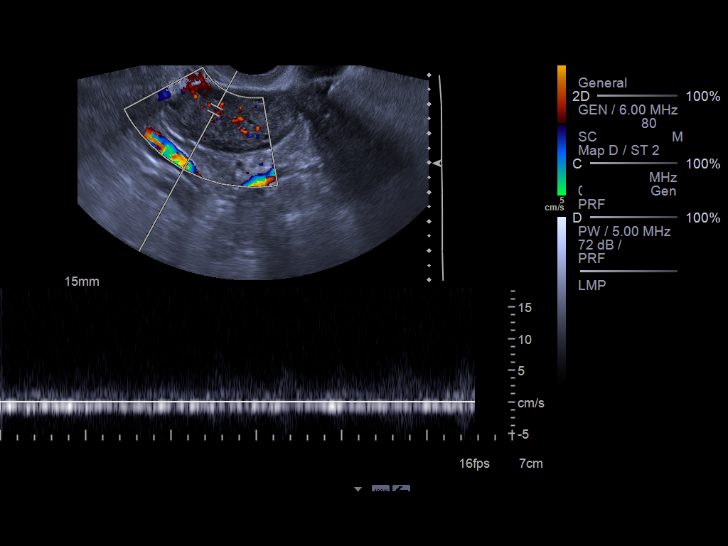
[im 31/37]
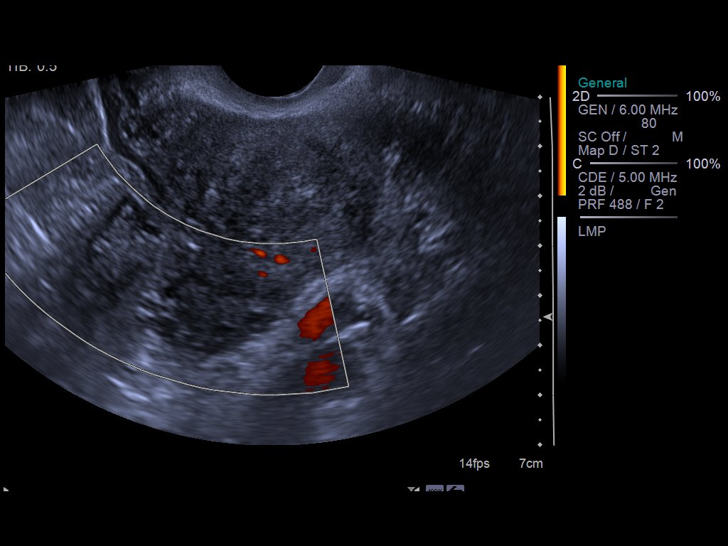
[im 34/37]
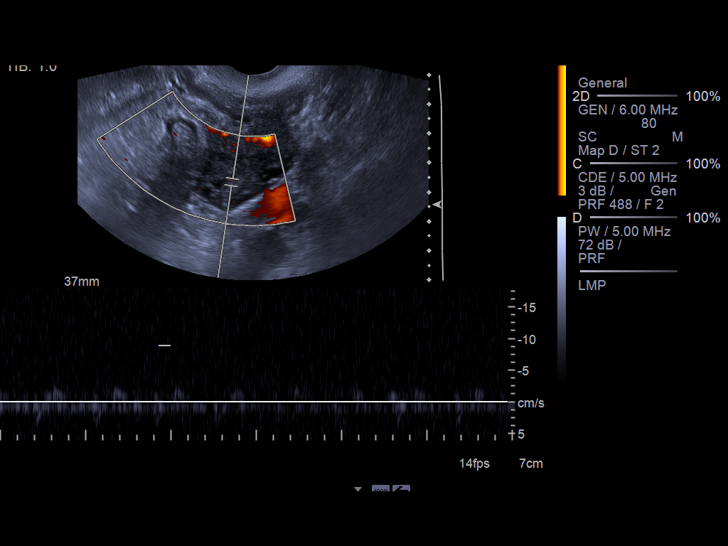
[im 37/37]
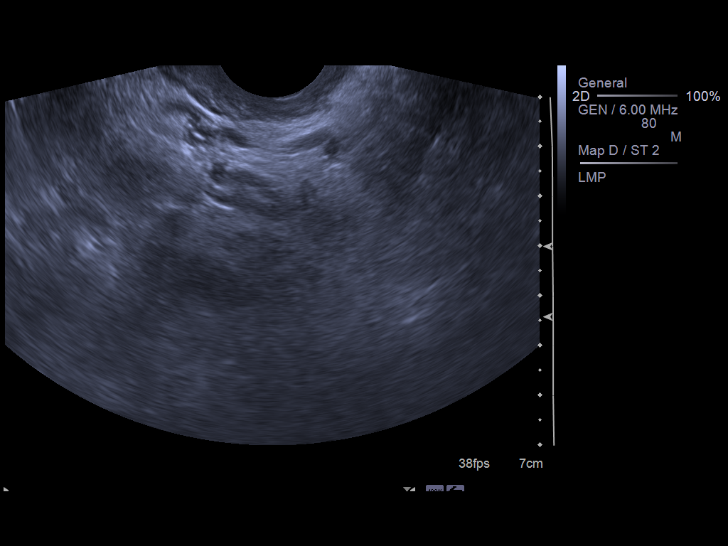

[14 of 25 positions shown; findings below may reference images not displayed]

FINDINGS: Uterus:  Measures 8.9 x 4.9 x 6.5 cm.  Three fibroids are
identified.  The largest measures 2.1 x 2.0 x 1.9 cm.

Endometrium:  Measures 0.5 cm.

Right ovary:  Measures 4.5 x 2.4 x 2.3 cm.  Normal appearance/no
adnexal mass.

Left ovary:  Measures 3.3 x 2.4 x 2.6 cm.  Normal appearance/no
adnexal mass.

Other findings:  No free fluid.

Pulsed Doppler evaluation demonstrates normal low-resistance
arterial and venous waveforms in both ovaries.
IMPRESSION: Fibroid uterus.  No acute finding.

No sonographic evidence for ovarian torsion.

## 2010-07-27 LAB — POCT PREGNANCY, URINE: Preg Test, Ur: NEGATIVE

## 2010-07-27 LAB — POCT URINALYSIS DIP (DEVICE)
Glucose, UA: NEGATIVE mg/dL
Ketones, ur: NEGATIVE mg/dL
Protein, ur: NEGATIVE mg/dL
Specific Gravity, Urine: 1.02 (ref 1.005–1.030)

## 2010-07-27 LAB — GC/CHLAMYDIA PROBE AMP, GENITAL: GC Probe Amp, Genital: NEGATIVE

## 2010-09-06 ENCOUNTER — Encounter: Payer: Self-pay | Admitting: Family Medicine

## 2010-09-06 ENCOUNTER — Other Ambulatory Visit (HOSPITAL_COMMUNITY)
Admission: RE | Admit: 2010-09-06 | Discharge: 2010-09-06 | Disposition: A | Discharge status: Home / Self Care | Payer: Self-pay | Source: Ambulatory Visit | Attending: Family Medicine | Admitting: Family Medicine

## 2010-09-06 ENCOUNTER — Ambulatory Visit (INDEPENDENT_AMBULATORY_CARE_PROVIDER_SITE_OTHER): Payer: Self-pay | Admitting: Family Medicine

## 2010-09-06 DIAGNOSIS — Z124 Encounter for screening for malignant neoplasm of cervix: Secondary | ICD-10-CM

## 2010-09-06 DIAGNOSIS — B009 Herpesviral infection, unspecified: Secondary | ICD-10-CM

## 2010-09-06 DIAGNOSIS — N898 Other specified noninflammatory disorders of vagina: Secondary | ICD-10-CM

## 2010-09-06 DIAGNOSIS — M25561 Pain in right knee: Secondary | ICD-10-CM

## 2010-09-06 DIAGNOSIS — N760 Acute vaginitis: Secondary | ICD-10-CM

## 2010-09-06 DIAGNOSIS — Z01419 Encounter for gynecological examination (general) (routine) without abnormal findings: Secondary | ICD-10-CM | POA: Insufficient documentation

## 2010-09-06 DIAGNOSIS — B001 Herpesviral vesicular dermatitis: Secondary | ICD-10-CM

## 2010-09-06 DIAGNOSIS — M25569 Pain in unspecified knee: Secondary | ICD-10-CM

## 2010-09-06 HISTORY — DX: Herpesviral vesicular dermatitis: B00.1

## 2010-09-06 LAB — POCT WET PREP (WET MOUNT): Trichomonas Wet Prep HPF POC: NEGATIVE

## 2010-09-06 MED ORDER — LIDOCAINE 5 % EX OINT: TOPICAL_OINTMENT | CUTANEOUS | Status: DC | PRN

## 2010-09-06 MED ORDER — LISINOPRIL-HYDROCHLOROTHIAZIDE 20-12.5 MG PO TABS: 1.0000 | ORAL_TABLET | Freq: Every day | ORAL | Status: DC

## 2010-09-06 NOTE — Progress Notes (Signed)
  Subjective:    Patient ID: Alicia West, female    DOB: Jul 17, 1970, 40 y.o.   MRN: [id number]  HPI  1.  Vaginal discharge:  Present for several weeks.  Describes as "grayish-buildup" around vaginal introitus that occurs when her blood sugars are elevated.  Attempts to wash this off in shower but pain from fissure often limits this (see #2 below).  Diagnosed in ED with BV, treated with Flagyl, but discharge persists.  Describes itching and burning, took some OTC yeast cream and this resolved.  No dysuria, fevers or chills.    2.  Vaginal fissure:  Present for several years.  This occurs 2-3 times a year and has been treated with Lidocaine in past due to pain.    3.  Right Knee pain:  Persists.  Being followed at [facility].  Knee US showed patellar fracture.  Some pain relief when wearing brace.  Pain relief mild with Ibuprofen, no relief with Mobic.  Has fu appt with [facility] next month.    4.  Fibroids:  Diagnosed when she had her ultrasound at ED visit for vaginal discharge.  Complains of irregular abdominal pain, mostly when she has menstrual cycle.  This was present prior to fibroid diagnosis.  No nausea, vomiting, changes in bowel habits.  No increased menstrual bleeding or intercycle spotting.  5.  Preventative:  Here for pap today.      Review of Systems See HPI above for review of systems.       Objective:   Physical Exam Gen:  Alert, cooperative patient who appears stated age in no acute distress.  Vital signs reviewed. Abd:  Soft/nondistended/nontender.  Good bowel sounds throughout all four quadrants.  No masses noted.  GYN:  External genitalia reveals 0.3 cm fissure located Left upper labia.  No vesicles or other changes to genitalia.  Vaginal mucosa pink, moist, normal rugae.  Nonfriable cervix without lesions, no discharge or bleeding noted on speculum exam.  Bimanual exam revealed normal, nongravid uterus.  No cervical motion tenderness. No adnexal masses  bilaterally.   MSK:  RIght Knee TTP at tibial tuberosity.  No gait abnormalities.  No laxity of joint          Assessment & Plan:

## 2010-09-06 NOTE — Patient Instructions (Addendum)
I will let you know the results of your tests.  One of them will come back later today, the other will come back sometime tomorrow or Friday.  The pap smear will come back in several days. I will prescribe you medicines based on the results of the test. The name of the "good" bacteria found in yogurt is called Lactobacillus.  These now come in over the counter pill form.   You have a vaginal fissure.  This means that you have a small cut.  I can give you some lidocaine to help with this.  You should also use an antibacterial ointment to keep the area clean.  You should also try a Sitz bath, as detailed below. Keep your appointment with [facility] regarding your knee pain.     Sitz Bath A sitz bath is a warm water bath taken in the sitting position that covers only the hips and buttocks. It may be used for either healing or hygiene purposes. The water may contain medication. Sitz baths are often used to relieve pain, itching or muscle spasms. Moist heat will help you heal and relax. Follow these steps carefully: Cornucopia the bathtub half full with warm water. Not Too Hot!   Sit in the water and open the drain a little.   Turn on the warm water to keep the tub half full. Keep the water running constantly.   Soak in the water for 15 to 20 minutes.   After the sitz bath, blot dry the affected part first.   Take 3 to 4 sitz baths a day.  SEEK MEDICAL CARE IF: You get worse instead of better; stop the sitz baths. Document Released: 10/15/2003 Document Re-Released: 02/13/2009 [facility] Patient Information 2011 [place].

## 2010-09-07 LAB — GC/CHLAMYDIA PROBE AMP, GENITAL
Chlamydia, DNA Probe: NEGATIVE
GC Probe Amp, Genital: NEGATIVE

## 2010-09-09 ENCOUNTER — Encounter: Payer: Self-pay | Admitting: Family Medicine

## 2010-09-09 DIAGNOSIS — N898 Other specified noninflammatory disorders of vagina: Secondary | ICD-10-CM | POA: Insufficient documentation

## 2010-09-09 NOTE — Assessment & Plan Note (Signed)
Persists, though as improved. To continue wearing brace and home exercises.  To keep FU at [facility].

## 2010-09-09 NOTE — Assessment & Plan Note (Signed)
Wet prep negative.  Possibly secondary to herpes, though much less likely.   Recommended strict blood sugar control. FU if no relief

## 2010-09-09 NOTE — Assessment & Plan Note (Signed)
Diagnosis by physical exam.  When I began to discuss my suspicion for this, she said "I know, I was diagnosed with that years ago." Discussed anti-viral treatment, she declined as this has always resolved on its own in past. Would like lidocaine so she can wash the area well. Discussed transmission of virus and need for safe sexual practices.  Patient expressed understanding.

## 2010-10-30 LAB — DIFFERENTIAL
Basophils Relative: 0
Eosinophils Absolute: 0.1
Lymphs Abs: 2.4
Monocytes Relative: 5
Neutro Abs: 7.8 — ABNORMAL HIGH
Neutrophils Relative %: 72

## 2010-10-30 LAB — BASIC METABOLIC PANEL
BUN: 14
Calcium: 9.4
Creatinine, Ser: 1.23 — ABNORMAL HIGH
GFR calc Af Amer: 59 — ABNORMAL LOW

## 2010-10-30 LAB — POCT CARDIAC MARKERS
Myoglobin, poc: 98
Operator id: 3206

## 2010-10-30 LAB — CBC
Platelets: 557 — ABNORMAL HIGH
RBC: 4.43
WBC: 10.9 — ABNORMAL HIGH

## 2010-10-30 LAB — URINALYSIS, ROUTINE W REFLEX MICROSCOPIC
Glucose, UA: 500 — AB
Hgb urine dipstick: NEGATIVE
Ketones, ur: 15 — AB
Protein, ur: 100 — AB

## 2010-10-30 LAB — D-DIMER, QUANTITATIVE: D-Dimer, Quant: 0.45

## 2010-10-30 LAB — URINE MICROSCOPIC-ADD ON

## 2010-12-20 ENCOUNTER — Other Ambulatory Visit: Payer: Self-pay | Admitting: Family Medicine

## 2010-12-20 NOTE — Telephone Encounter (Signed)
Refill request

## 2011-01-10 ENCOUNTER — Encounter: Payer: Self-pay | Admitting: Family Medicine

## 2011-01-10 ENCOUNTER — Ambulatory Visit (INDEPENDENT_AMBULATORY_CARE_PROVIDER_SITE_OTHER): Payer: Self-pay | Admitting: Family Medicine

## 2011-01-10 VITALS — BP 145/97 | HR 103 | Ht 69.0 in | Wt 255.6 lb

## 2011-01-10 DIAGNOSIS — E119 Type 2 diabetes mellitus without complications: Secondary | ICD-10-CM

## 2011-01-10 DIAGNOSIS — R300 Dysuria: Secondary | ICD-10-CM

## 2011-01-10 DIAGNOSIS — I10 Essential (primary) hypertension: Secondary | ICD-10-CM

## 2011-01-10 DIAGNOSIS — N390 Urinary tract infection, site not specified: Secondary | ICD-10-CM

## 2011-01-10 DIAGNOSIS — F411 Generalized anxiety disorder: Secondary | ICD-10-CM

## 2011-01-10 DIAGNOSIS — F419 Anxiety disorder, unspecified: Secondary | ICD-10-CM

## 2011-01-10 DIAGNOSIS — IMO0001 Reserved for inherently not codable concepts without codable children: Secondary | ICD-10-CM

## 2011-01-10 LAB — POCT URINALYSIS DIPSTICK
Bilirubin, UA: NEGATIVE
Blood, UA: NEGATIVE
Leukocytes, UA: NEGATIVE
Protein, UA: NEGATIVE
pH, UA: 6.5

## 2011-01-10 MED ORDER — CLONAZEPAM 0.5 MG PO TABS: 0.5000 mg | ORAL_TABLET | Freq: Two times a day (BID) | ORAL | Status: DC | PRN

## 2011-01-10 MED ORDER — CEPHALEXIN 500 MG PO CAPS: 500.0000 mg | ORAL_CAPSULE | Freq: Two times a day (BID) | ORAL | Status: AC

## 2011-01-10 NOTE — Assessment & Plan Note (Signed)
Check A1c today. Encouraged to continue medication use and compliance.

## 2011-01-10 NOTE — Assessment & Plan Note (Signed)
Problem for patient. She does have history of depression for which she was treated with SSRI but she stopped taking on her own because she has not like taking medications recently. No further symptoms of depression. States that symptoms occur basically 2 weeks of being a menstrual period Will provide treatment of anxiety with short-term of benzodiazepines. Followup in one month.

## 2011-01-10 NOTE — Patient Instructions (Signed)
Take the Clonazepam once at night for anxiety.  You can also take it during the day if needed. Take the Keflex twice daily x 7 days for presumed UTI.   We will check your A1C today as well.   Good luck and I hope things work out soon!

## 2011-01-10 NOTE — Progress Notes (Signed)
  Subjective:    Patient ID: Alicia West, female    DOB: [birth year], 40 y.o.   MRN: [id number]  HPI 40 year old female here for work in visit for dysuria but also with problems as listed below.  1. Dysuria: Visit for past week. Describes burning sensation when she urinates. Also states that she knows she's been eating more sugary snacks than usual, please see diabetes below. Has had some suprapubic pain. No fevers or chills. No back pain.  2. Anxiety/anger: This actually seems to be the patient's primary problem for today. She states she's been having increasing episodes of angry outbursts, shaking did anger, generalized anxiety. She states that her brother moved in with her in September. He is not contributing to bills or any finances. Do not get along well and she is not sleeping well secondary to her anger and anxiety. He is supposed to be moving out in January but she is unsure that he actually well. Denies any actual depression.  3. Diabetes:  Currently on metformin and 50 units of Lantus daily.    No adverse effects from medication.  No hypoglycemic events.  No paresthesia or peripheral nerve pain.  Measures blood sugars at home every:    Day or so. CBGs have been running 150-200 per patient. As noted above she states she has been eating more sugary sweets than usual. Lab Results  Component Value Date   HGBA1C 10.3 07/10/2010   4.  Elevated BP:  The blood pressure cuff at home and states these have been running 950H systolic. Believes is secondary to anger and brother. Not interested starting another blood pressure medicine until she gets her anger under better control. BP Readings from Last 3 Encounters:  01/10/11 145/97  09/06/10 154/103  07/20/10 148/100   I have reviewed the patient's pertinent past medical, surgical, family, and social history, including fact that patient is nonsmoker.  Her last A1C is listed above.    Review of Systems See HPI above for review of systems.      Objective:   Physical Exam Gen:  Alert, cooperative patient who appears stated age in no acute distress.  Vital signs reviewed. Neck:  No thyromegaly Cardiac:  Regular rate and rhythm without murmur auscultated.  Good S1/S2. Pulm:  Clear to auscultation bilaterally with good air movement.  No wheezes or rales noted.   Abd:  Soft/nondistended/nontender.  Good bowel sounds throughout all four quadrants.  No masses noted.  Ext:  No clubbing/cyanosis/erythema.  No edema noted bilateral lower extremities.          Assessment & Plan:

## 2011-01-10 NOTE — Assessment & Plan Note (Signed)
Lab director not currently available today. Did not run urine secondary to this. Will presumptively treat based on symptoms.

## 2011-01-10 NOTE — Assessment & Plan Note (Signed)
Continue to follow. Patient not amenable to starting any new medications until after anxiety is controlled.

## 2011-01-12 ENCOUNTER — Telehealth: Payer: Self-pay | Admitting: Family Medicine

## 2011-01-12 NOTE — Telephone Encounter (Signed)
UA negative for UTI, but still glad that we treated.  No evidence of STDs, yeast, or BV.  Please give patient these results if she calls back.  Thanks

## 2011-01-12 NOTE — Telephone Encounter (Signed)
Attempted to call to inform of below....message is that phone is not taking incoming calls

## 2011-01-15 NOTE — Telephone Encounter (Signed)
LVM for patient to call back. ?

## 2011-01-15 NOTE — Telephone Encounter (Signed)
Spoke with patient and informed of below

## 2011-01-15 NOTE — Telephone Encounter (Signed)
Returning Sara's call

## 2011-01-27 ENCOUNTER — Emergency Department (HOSPITAL_COMMUNITY)
Admission: EM | Admit: 2011-01-27 | Discharge: 2011-01-27 | Disposition: A | Discharge status: Home / Self Care | Payer: Self-pay | Attending: Emergency Medicine | Admitting: Emergency Medicine

## 2011-01-27 ENCOUNTER — Emergency Department (HOSPITAL_COMMUNITY): Payer: Self-pay

## 2011-01-27 ENCOUNTER — Encounter (HOSPITAL_COMMUNITY): Payer: Self-pay

## 2011-01-27 DIAGNOSIS — R220 Localized swelling, mass and lump, head: Secondary | ICD-10-CM | POA: Insufficient documentation

## 2011-01-27 DIAGNOSIS — R059 Cough, unspecified: Secondary | ICD-10-CM | POA: Insufficient documentation

## 2011-01-27 DIAGNOSIS — J3489 Other specified disorders of nose and nasal sinuses: Secondary | ICD-10-CM | POA: Insufficient documentation

## 2011-01-27 DIAGNOSIS — I10 Essential (primary) hypertension: Secondary | ICD-10-CM | POA: Insufficient documentation

## 2011-01-27 DIAGNOSIS — Z794 Long term (current) use of insulin: Secondary | ICD-10-CM | POA: Insufficient documentation

## 2011-01-27 DIAGNOSIS — E119 Type 2 diabetes mellitus without complications: Secondary | ICD-10-CM | POA: Insufficient documentation

## 2011-01-27 DIAGNOSIS — Z79899 Other long term (current) drug therapy: Secondary | ICD-10-CM | POA: Insufficient documentation

## 2011-01-27 DIAGNOSIS — J069 Acute upper respiratory infection, unspecified: Secondary | ICD-10-CM | POA: Insufficient documentation

## 2011-01-27 DIAGNOSIS — R110 Nausea: Secondary | ICD-10-CM | POA: Insufficient documentation

## 2011-01-27 DIAGNOSIS — R05 Cough: Secondary | ICD-10-CM | POA: Insufficient documentation

## 2011-01-27 DIAGNOSIS — R070 Pain in throat: Secondary | ICD-10-CM | POA: Insufficient documentation

## 2011-01-27 HISTORY — DX: Contact with and (suspected) exposure to tuberculosis: Z20.1

## 2011-01-27 LAB — RAPID STREP SCREEN (MED CTR MEBANE ONLY): Streptococcus, Group A Screen (Direct): NEGATIVE

## 2011-01-27 IMAGING — CR DG CHEST 2V
2 series · 2 of 2 positions shown · non-contrast
Comparison: Portable chest x-ray of [DATE]

CLINICAL DATA: Shortness of breath, cough, congestion for 4 days

CHEST - 2 VIEW

[w chest pa]
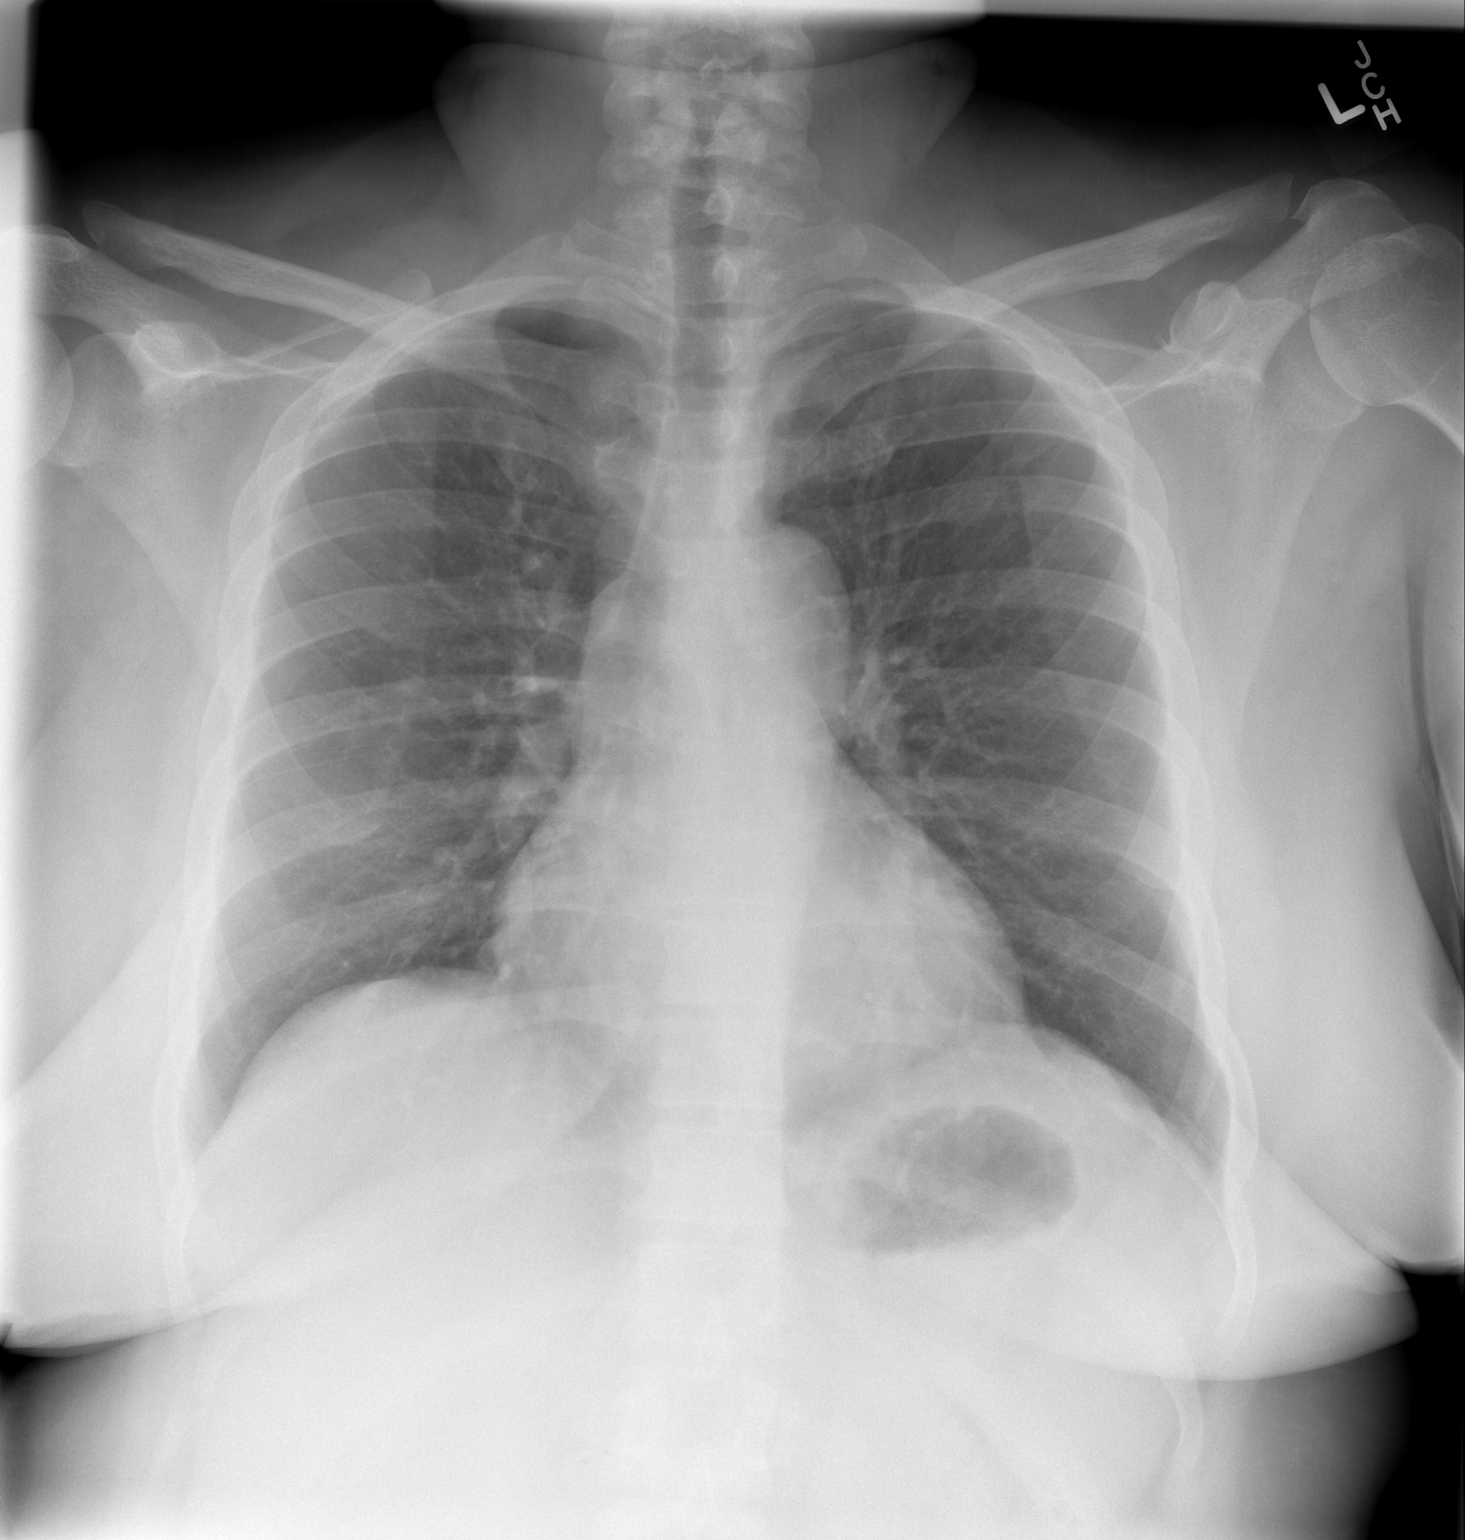

[w chest lat]
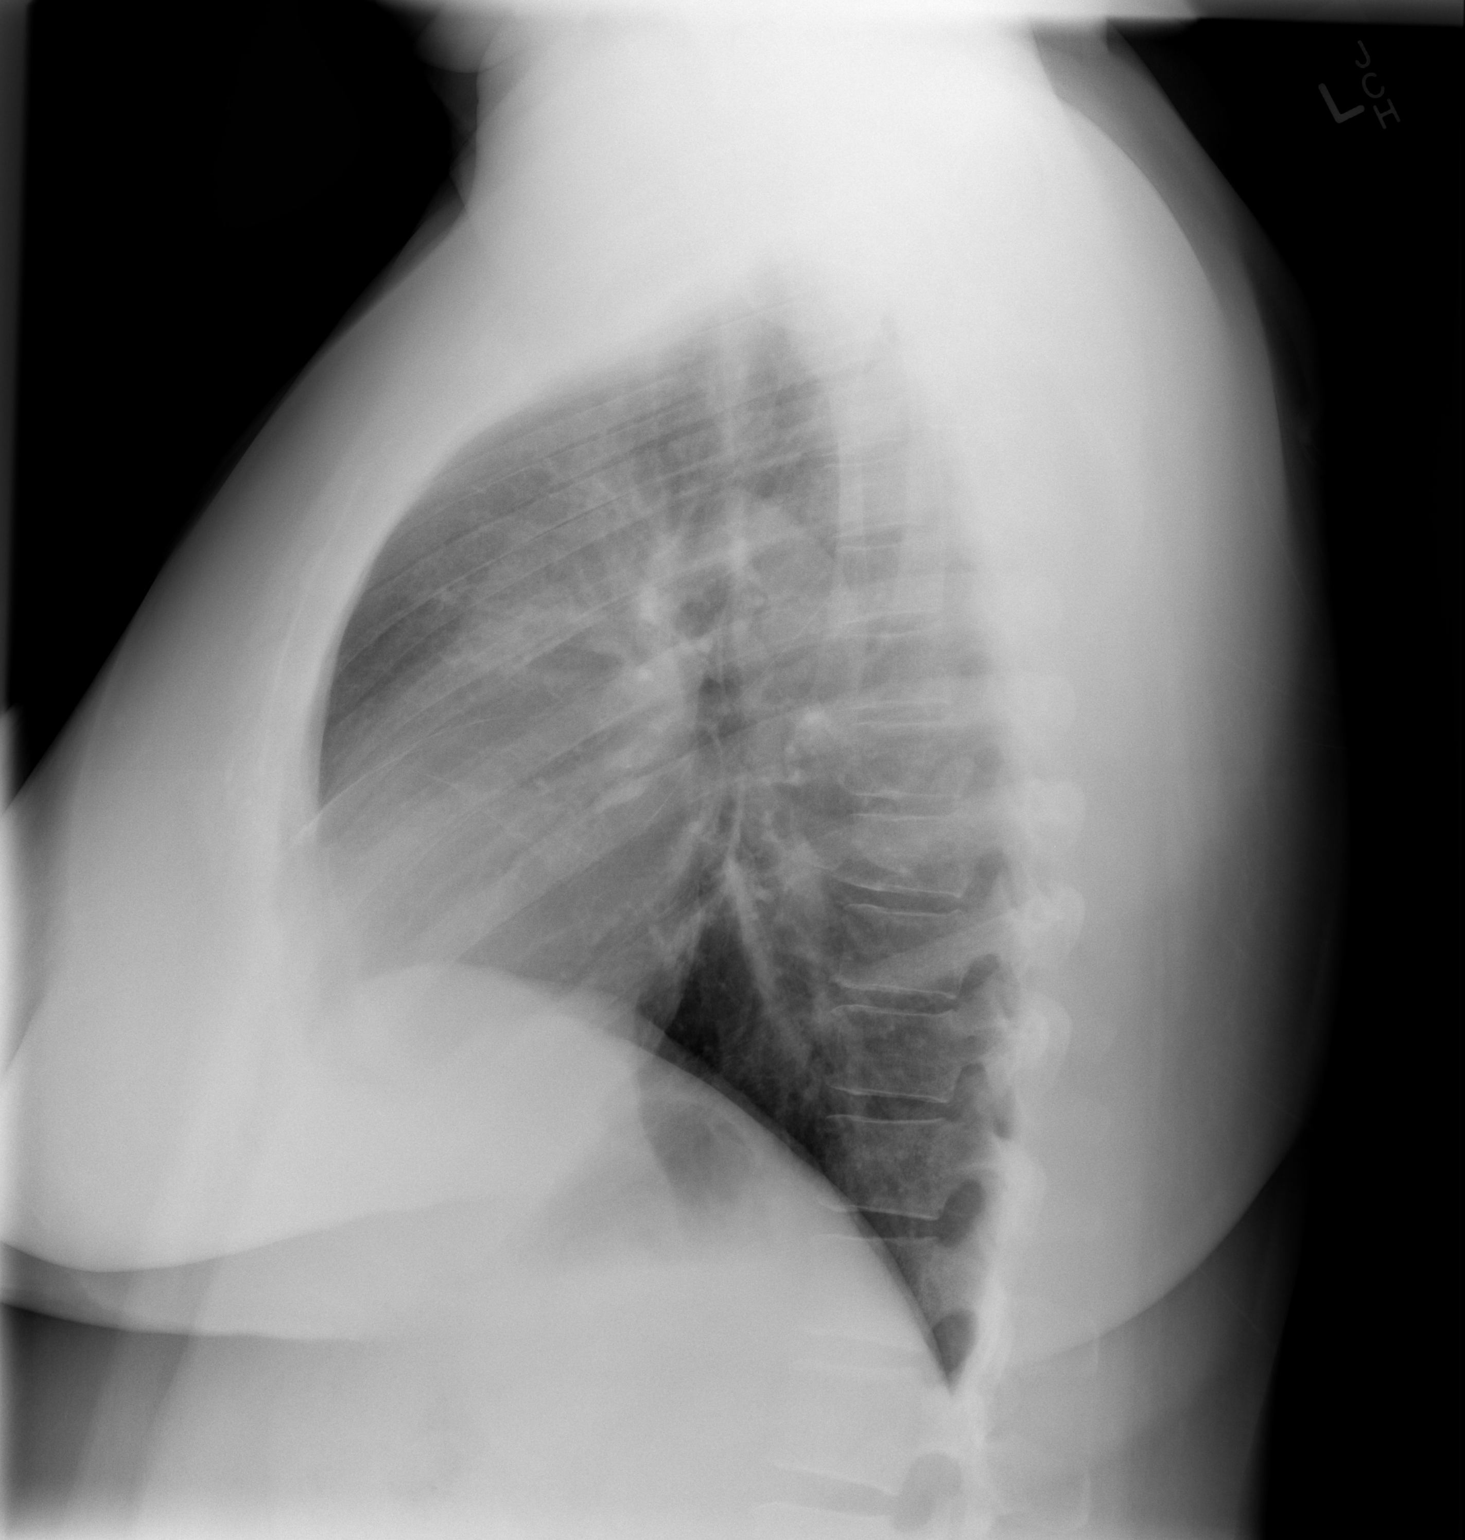

[2 of 2 positions shown; findings below may reference images not displayed]

FINDINGS: The lungs are clear.  Mediastinal contours appear normal.
The heart is within normal limits in size.  No bony abnormality is
seen.
IMPRESSION: No active lung disease.

## 2011-01-27 MED ORDER — PREDNISONE 50 MG PO TABS: 50.0000 mg | ORAL_TABLET | Freq: Every day | ORAL | Status: AC

## 2011-01-27 MED ORDER — GUAIFENESIN ER 1200 MG PO TB12: 1.0000 | ORAL_TABLET | Freq: Two times a day (BID) | ORAL | Status: DC

## 2011-01-27 MED ORDER — ACETAMINOPHEN-CODEINE 120-12 MG/5ML PO SOLN: 10.0000 mL | ORAL | Status: AC | PRN

## 2011-01-27 NOTE — ED Provider Notes (Signed)
History     CSN: [id number]  Arrival date & time 01/27/11  7026   First MD Initiated Contact with Patient 01/27/11 [phone]      Chief Complaint  Patient presents with  . Sore Throat    (Consider location/radiation/quality/duration/timing/severity/associated sxs/prior treatment) HPI The patient presents to the emergency department with sore throat and nasal congestion, cough, and nausea for the past 4 days.  Patient denies chest pain, shortness of breath, weakness, abdominal pain, blurred vision, or headache.  She states that she noticed some blood in her mucus when she coughed this morning.  Patient states she has not tried any medication for her symptoms.  She does not notice anything seems to make the condition worse.  Past Medical History  Diagnosis Date  . Diabetes mellitus   . Hypertension   . Herpes simplex labialis   . TB (tuberculosis) contact     + skin test,/- chest x-ray    History reviewed. No pertinent past surgical history.  No family history on file.  History  Substance Use Topics  . Smoking status: Never Smoker   . Smokeless tobacco: Never Used  . Alcohol Use: No    OB History    Grav Para Term Preterm Abortions TAB SAB Ect Mult Living                  Review of Systems All pertinent positives and negatives in the history of present illness  Allergies  Penicillins  Home Medications   Current Outpatient Rx  Name Route Sig Dispense Refill  . ATORVASTATIN CALCIUM 20 MG PO TABS Oral Take 20 mg by mouth daily.     Marland Kitchen CETIRIZINE HCL 10 MG PO TBDP Oral Take 1 tablet by mouth daily as needed. Sneezing and coughing    . CLONAZEPAM 0.5 MG PO TABS Oral Take 0.5 mg by mouth 2 (two) times daily as needed. For anxiety     . FLUTICASONE PROPIONATE 50 MCG/ACT NA SUSP Nasal Place 2 sprays into the nose 2 (two) times daily as needed. For allergies    . GLIPIZIDE 10 MG PO TABS Oral Take 5-10 mg by mouth 2 (two) times daily. Take 1 tab in the morning and 1/2 tablet  by mouth in the evening    . INSULIN GLARGINE 100 UNIT/ML [place] SOLN Subcutaneous Inject 55 Units into the skin daily. Disp: 2 box     . LISINOPRIL-HYDROCHLOROTHIAZIDE 20-12.5 MG PO TABS Oral Take 1 tablet by mouth daily.      . MELOXICAM 15 MG PO TABS Oral Take 15 mg by mouth daily as needed. For knee pain     . [place] W/DEVICE KIT Does not apply by Does not apply route.      Marland Kitchen GLUCOSE BLOOD VI STRP Other 1 each by Other route daily. Use as instructed     . KETOPROFEN POWD Apply externally Apply 1 application topically 3 (three) times daily. Ketoprofen 20% gel compounded Apply to right knee 3-4 times a day - pt needs to get prescription filled - not used yet     . LIDOCAINE 5 % EX OINT Topical Apply 1 application topically as needed. To numb areas as needed       BP 141/96  Pulse 101  Temp(Src) 98.2 F (36.8 C) (Oral)  Resp 16  SpO2 98%  LMP 01/07/2011  Physical Exam  Constitutional: She is oriented to person, place, and time. She appears well-developed and well-nourished. No distress.  HENT:  Head:  Normocephalic and atraumatic. No trismus in the jaw.  Mouth/Throat: Mucous membranes are normal. Uvula swelling present. No oropharyngeal exudate, posterior oropharyngeal edema, posterior oropharyngeal erythema or tonsillar abscesses.  Eyes: Pupils are equal, round, and reactive to light.  Cardiovascular: Normal rate, regular rhythm and normal heart sounds.   Pulmonary/Chest: Effort normal and breath sounds normal. She has no wheezes. She has no rales.  Abdominal: Soft.  Neurological: She is alert and oriented to person, place, and time.  Skin: Skin is warm and dry. No rash noted.    ED Course  Procedures (including critical care time)   Labs Reviewed  RAPID STREP SCREEN   Dg Chest 2 View  01/27/2011  *RADIOLOGY REPORT*  Clinical Data: Shortness of breath, cough, congestion for 4 days  CHEST - 2 VIEW  Comparison: Portable chest x-ray of 09/08/2009  Findings: The lungs  are clear.  Mediastinal contours appear normal. The heart is within normal limits in size.  No bony abnormality is seen.  IMPRESSION: No active lung disease.  Original Report Authenticated By: Joretta Bachelor, M.D.    Patient has a viral URI based on her history of present illness and physical exam findings.  The patient does not have pneumonia or strep throat noted on her testing.  She is not showing any signs of severe illness at this point.  Advised to return here for any worsening in her condition.  Advised the patient increase her fluid intake.  Take Tylenol and Motrin for any fevers or pain.     MDM  See above comments MDM Reviewed: vitals and nursing note Interpretation: labs and x-ray          Brent General, PA-C 01/27/11 [place], [place] 01/27/11 1109

## 2011-01-27 NOTE — ED Notes (Signed)
Sore throat for 4 days, n/v and productive intermittent cough.  Pt. States she sees blood in her mucous.

## 2011-01-28 NOTE — ED Provider Notes (Signed)
Medical screening examination/treatment/procedure(s) were performed by non-physician practitioner and as supervising physician I was immediately available for consultation/collaboration.  Leota Jacobsen, MD 01/28/11 [phone]

## 2011-02-26 ENCOUNTER — Other Ambulatory Visit: Payer: Self-pay | Admitting: Family Medicine

## 2011-02-26 MED ORDER — INSULIN GLARGINE 100 UNIT/ML ~~LOC~~ SOLN: 55.0000 [IU] | Freq: Every day | SUBCUTANEOUS | Status: DC

## 2011-04-19 ENCOUNTER — Other Ambulatory Visit: Payer: Self-pay | Admitting: Family Medicine

## 2011-04-19 NOTE — Telephone Encounter (Signed)
Refill request

## 2011-05-16 ENCOUNTER — Ambulatory Visit: Payer: Self-pay | Admitting: Family Medicine

## 2011-05-24 ENCOUNTER — Ambulatory Visit: Payer: Self-pay | Admitting: Family Medicine

## 2011-05-30 ENCOUNTER — Ambulatory Visit (INDEPENDENT_AMBULATORY_CARE_PROVIDER_SITE_OTHER): Payer: Self-pay | Admitting: Family Medicine

## 2011-05-30 ENCOUNTER — Telehealth: Payer: Self-pay | Admitting: *Deleted

## 2011-05-30 ENCOUNTER — Encounter: Payer: Self-pay | Admitting: Family Medicine

## 2011-05-30 VITALS — BP 148/100 | HR 107 | Temp 98.6°F | Ht 69.0 in | Wt 252.0 lb

## 2011-05-30 DIAGNOSIS — J309 Allergic rhinitis, unspecified: Secondary | ICD-10-CM

## 2011-05-30 DIAGNOSIS — E119 Type 2 diabetes mellitus without complications: Secondary | ICD-10-CM

## 2011-05-30 DIAGNOSIS — K5900 Constipation, unspecified: Secondary | ICD-10-CM

## 2011-05-30 DIAGNOSIS — Z23 Encounter for immunization: Secondary | ICD-10-CM

## 2011-05-30 DIAGNOSIS — J302 Other seasonal allergic rhinitis: Secondary | ICD-10-CM

## 2011-05-30 DIAGNOSIS — IMO0001 Reserved for inherently not codable concepts without codable children: Secondary | ICD-10-CM

## 2011-05-30 LAB — POCT GLYCOSYLATED HEMOGLOBIN (HGB A1C): Hemoglobin A1C: 11.3

## 2011-05-30 MED ORDER — MONTELUKAST SODIUM 10 MG PO TABS: 10.0000 mg | ORAL_TABLET | Freq: Every day | ORAL | Status: DC

## 2011-05-30 MED ORDER — METFORMIN HCL 500 MG PO TABS: 500.0000 mg | ORAL_TABLET | Freq: Two times a day (BID) | ORAL | Status: DC

## 2011-05-30 MED ORDER — TETANUS-DIPHTH-ACELL PERTUSSIS 5-2.5-18.5 LF-MCG/0.5 IM SUSP: 0.5000 mL | Freq: Once | INTRAMUSCULAR | Status: DC

## 2011-05-30 NOTE — Patient Instructions (Signed)
We'll do referral back to [place] today. Tetanus today. Try the Metformin 500 mg once a day for 1 week.  Then increase to twice a day.   Just take the Glipizide 10 mg 1 pill in the morning.    For your constipation, you need to 2 different things: 1) Take the stool softener twice a day.   2)  Use either Miralax daily.  Mix this in with whatever drink you like.  You can do this up to 3 times a day until you have a bowel movement.

## 2011-05-30 NOTE — Assessment & Plan Note (Addendum)
Refer back to [facility] for ophtho exam.   Foot exam completed today. She is doing well from a peripheral neuropathy standpoint. Her A1c however is increased 11.4. She does admit after learning these numbers that she is not consistent with glipizide and Lantus use. No changes to medications but did discuss with patient that the best way to control her blood sugars and to prevent long-term complications would be regular daily use of antidiabetic medications. Patient expressed understanding. Roscoe start back on metformin a low dose 500 mg once daily for a week and then increase to twice daily. She likely would not tolerate much higher dose than that. Did discuss that for most patients the diarrhea lasts for several days but then resolves.

## 2011-05-30 NOTE — Telephone Encounter (Signed)
Patient calling because she was at work this afternoon and stepped on metal rod.  States that rod went through shoe and punctured left great toe.  Toe is not bleeding.  Had Tdap when she came in for office visit this morning.  Instructed patient to wash area with soap and warm water.  Can apply OTC antibiotic ointment, take Tylenol or ibuprofen for pain, and monitor for signs of infection (redness, swelling, pus, heat).  Patient is diabetic and will route to [name] for any additional advice and call her back.  Nolene Ebbs, RN

## 2011-05-30 NOTE — Progress Notes (Signed)
  Subjective:    Patient ID: Alicia West, female    DOB: 03-09-1970, 41 y.o.   MRN: [id number]  HPI  1.  Seasonal allergies:  Worse now with weather change.  Taking Zyrtec with only intermittent relief.  Using Flonase several times a day without relief.  Has fairly constant post-nasal drip and itchy eyes.  Has never been on Singulair.  No fevers or chills.    2.  Diabetes:  Currently on glucotrol, and 55 units Lantus.     No adverse effects from medication.  No hypoglycemic events.  States she was on metformin 1000 mg by mouth twice a day but had trouble with diarrhea and therefore could not continue this medication. No paresthesia or peripheral nerve pain.  Measures blood sugars at home every:    Several days. Lab Results  Component Value Date   HGBA1C 11.3 05/30/2011   3.  Constipation:  Problem for years for patient.  Take laxative for relief every 4-5 days, but has trouble in between those days.  Has tried anywhere from one cup to one entire bottle of prune juice in one day without relief. Does describe hard stools but she does have bowel movement. No bleeding. No melena. Occasionally has nausea which is gone several days without a bowel movement.  Review of Systems See HPI above for review of systems.       Objective:   Physical Exam Gen:  Alert, cooperative patient who appears stated age in no acute distress.  Vital signs reviewed. HEENT:  [place]/AT.  EOMI, PERRL.  MMM, tonsils non-erythematous, non-edematous.  External ears WNL, Bilateral TM's normal without retraction, redness or bulging.  Pulm:  Clear to auscultation bilaterally with good air movement.  No wheezes or rales noted.   Cardiac:  Regular rate and rhythm without murmur auscultated.  Good S1/S2. Abd:  Soft/nondistended/nontender.  Good bowel sounds throughout all four quadrants.  No masses noted.  Diabetic foot exam:  Sensation intact bilateral lower extremities to light touch, pinprick, proprioception. Pulses +1 BL  DP/PT       Assessment & Plan:

## 2011-05-30 NOTE — Telephone Encounter (Signed)
She should probably be seen in next day or so due to foot injury in diabetic.  Concern would be for infection, not just tetanus.  Luckily she did receive that today.

## 2011-05-30 NOTE — Telephone Encounter (Signed)
Left message on voicemail for patient to schedule appt to check foot per [name].  Nolene Ebbs, RN

## 2011-05-31 DIAGNOSIS — J302 Other seasonal allergic rhinitis: Secondary | ICD-10-CM | POA: Insufficient documentation

## 2011-05-31 DIAGNOSIS — K5900 Constipation, unspecified: Secondary | ICD-10-CM | POA: Insufficient documentation

## 2011-05-31 NOTE — Telephone Encounter (Signed)
Patient called back and scheduled appt with [name] for 06/01/11 at 2:30pm.  Nolene Ebbs, RN

## 2011-05-31 NOTE — Assessment & Plan Note (Signed)
Likely functional constipation. Please instructions for treatment. She is to followup in about a month for assessment. No signs of obstruction

## 2011-05-31 NOTE — Assessment & Plan Note (Signed)
Currently on Flonase and Zyrtec daily. We'll add Singulair and assess for improvement.

## 2011-06-01 ENCOUNTER — Encounter: Payer: Self-pay | Admitting: Family Medicine

## 2011-06-01 ENCOUNTER — Ambulatory Visit (INDEPENDENT_AMBULATORY_CARE_PROVIDER_SITE_OTHER): Payer: Self-pay | Admitting: Family Medicine

## 2011-06-01 VITALS — BP 150/110 | HR 121 | Temp 98.1°F | Ht 69.0 in | Wt 254.6 lb

## 2011-06-01 DIAGNOSIS — S91119A Laceration without foreign body of unspecified toe without damage to nail, initial encounter: Secondary | ICD-10-CM

## 2011-06-01 DIAGNOSIS — I10 Essential (primary) hypertension: Secondary | ICD-10-CM

## 2011-06-01 DIAGNOSIS — S91109A Unspecified open wound of unspecified toe(s) without damage to nail, initial encounter: Secondary | ICD-10-CM

## 2011-06-01 MED ORDER — LISINOPRIL-HYDROCHLOROTHIAZIDE 20-25 MG PO TABS: 1.0000 | ORAL_TABLET | Freq: Every day | ORAL | Status: DC

## 2011-06-01 NOTE — Patient Instructions (Signed)
Keep using the antibiotic ointment and soap and water on your toe.    I increased the blood pressure medicine.  Schedule a nurse visit in 3-4 weeks to have this checked.

## 2011-06-01 NOTE — Progress Notes (Signed)
  Subjective:    Patient ID: Alicia West, female    DOB: 05-16-1970, 41 y.o.   MRN: [id number]  HPI  1. Foot wound:  Running in back yard and stepped on metal pole/nail on Wednesday (2 days ago).  Went through shoe into Left great toe.  Cleaned with hydrogen peroxide immediately.  She has been using soap and water as well as neosporin and keeping it covered with a bandage.  No purulent drainage/bleeding.  No nausea, vomiting, abdomen pain.  No red streaks from toe.  A1C was 11.3 and she received TDap day it occurred (here in clinic.)   2.  Hypertension:  Long-term problem for this patient.  No adverse effects from medication.  Not checking it regularly.  No HA, CP, dizziness, shortness of breath, palpitations, or LE swelling.   BP Readings from Last 3 Encounters:  06/01/11 150/110  05/30/11 148/100  01/27/11 141/96      Review of Systems See HPI above for review of systems.       Objective:   Physical Exam Gen:  Alert, cooperative patient who appears stated age in no acute distress.  Vital signs reviewed. Cardiac:  Regular rate and rhythm without murmur auscultated.  Good S1/S2. Pulm:  Clear to auscultation bilaterally with good air movement.  No wheezes or rales noted.   Ext:  No edema Foot:  Right great toe WNL Left great toe with small 1 cm laceration in size.  No redness, drainage, bleeding noted.  No lymphangitis.  Only minimal tenderness to direct palpation.        Assessment & Plan:

## 2011-06-04 DIAGNOSIS — S91119A Laceration without foreign body of unspecified toe without damage to nail, initial encounter: Secondary | ICD-10-CM | POA: Insufficient documentation

## 2011-06-04 NOTE — Assessment & Plan Note (Signed)
Very superficial laceration. Based on description in telephone note, I thought she had penetrating foot wound through shoe and therefore needed to be seen and possibly placed on pseudomonal coverage as she is diabetic.   However, no signs of infection today, laceration healing very well.   FU as needed, 1 week if no improvement or sooner if worsening.

## 2011-06-04 NOTE — Assessment & Plan Note (Signed)
Increased lisinopril/HCTZ combo today as she has persistently elevated BPs FU 2-4 weeks nurse visit for blood pressure check.

## 2011-06-08 ENCOUNTER — Telehealth: Payer: Self-pay | Admitting: Family Medicine

## 2011-06-08 NOTE — Telephone Encounter (Signed)
Called pt and she informed me that she has been having N/V/D and weakness. She stated that when she was taking this in the past she had the same outcome I told her to d/c metformin and continue to take all of her other medications as directed. made an appt for her to be seen on Monday with Dr. Martinique to discuss medication reaction.Alicia West [place]

## 2011-06-08 NOTE — Telephone Encounter (Signed)
Having a rx to the Metformin.  Causing nauea/vomitting and weakness.  Want advise as to what she should do about that.

## 2011-06-11 ENCOUNTER — Ambulatory Visit: Payer: Self-pay | Admitting: Family Medicine

## 2011-08-01 ENCOUNTER — Telehealth: Payer: Self-pay | Admitting: Family Medicine

## 2011-08-01 NOTE — Telephone Encounter (Signed)
Patient is calling because she needs a procedure done on her eyes and her sight is getting worse.  She is going to need a referral for the eye center for July or August.

## 2011-08-02 NOTE — Telephone Encounter (Signed)
LVM for patient to call back to get further information on the request below

## 2011-08-03 NOTE — Telephone Encounter (Signed)
LVM for patient to call back with some information on the request below. Does she already go to an eye center for this issues, what exactly is this issues related to, have we seen her for this already. Patient will most likely need to be seen by [name] before this referral can be made

## 2011-09-25 ENCOUNTER — Other Ambulatory Visit: Payer: Self-pay | Admitting: Family Medicine

## 2011-09-25 MED ORDER — INSULIN GLARGINE 100 UNIT/ML ~~LOC~~ SOLN: 55.0000 [IU] | Freq: Every day | SUBCUTANEOUS | Status: DC

## 2011-09-27 ENCOUNTER — Telehealth: Payer: Self-pay | Admitting: Family Medicine

## 2011-09-27 NOTE — Telephone Encounter (Signed)
Returned call to patient and informed to have Blaine contact [facility] to transfer her insulin Rx.  Patient will call our office back if she has any problems with transferring Rx.    Nolene Ebbs, RN

## 2011-09-27 NOTE — Telephone Encounter (Signed)
Patient is calling because her insulin was sent to [facility] and she [facility] because she can't afford it.  She is asking that it be resent to the correct place.

## 2011-10-03 ENCOUNTER — Other Ambulatory Visit: Payer: Self-pay | Admitting: *Deleted

## 2011-10-04 ENCOUNTER — Telehealth: Payer: Self-pay | Admitting: Family Medicine

## 2011-10-04 MED ORDER — CLONAZEPAM 0.5 MG PO TABS: 0.5000 mg | ORAL_TABLET | Freq: Two times a day (BID) | ORAL | Status: DC | PRN

## 2011-10-04 NOTE — Telephone Encounter (Signed)
Called and left message stating that I refilled Alicia West's Klonopin and that she could pick it up at the front desk.  I also stated that for further refills the patient would need to be seen by me for an office visit.  *I precepted this with Dr. Walker Kehr who was amendable to me refilling this medication.

## 2011-10-04 NOTE — Telephone Encounter (Signed)
Will refill for patient, but patient will need to be seen for further refills of this medication.

## 2011-10-26 ENCOUNTER — Ambulatory Visit: Payer: Self-pay | Admitting: Family Medicine

## 2011-10-30 ENCOUNTER — Other Ambulatory Visit: Payer: Self-pay | Admitting: Family Medicine

## 2011-12-17 ENCOUNTER — Encounter: Payer: Self-pay | Admitting: Home Health Services

## 2011-12-18 ENCOUNTER — Encounter: Payer: Self-pay | Admitting: Home Health Services

## 2012-01-08 ENCOUNTER — Encounter: Payer: Self-pay | Admitting: Family Medicine

## 2012-01-08 ENCOUNTER — Ambulatory Visit (INDEPENDENT_AMBULATORY_CARE_PROVIDER_SITE_OTHER): Payer: Self-pay | Admitting: Family Medicine

## 2012-01-08 VITALS — BP 149/98 | HR 94 | Temp 98.7°F | Ht 69.0 in | Wt 261.0 lb

## 2012-01-08 DIAGNOSIS — F411 Generalized anxiety disorder: Secondary | ICD-10-CM

## 2012-01-08 DIAGNOSIS — F419 Anxiety disorder, unspecified: Secondary | ICD-10-CM

## 2012-01-08 DIAGNOSIS — E1165 Type 2 diabetes mellitus with hyperglycemia: Secondary | ICD-10-CM

## 2012-01-08 DIAGNOSIS — M928 Other specified juvenile osteochondrosis: Secondary | ICD-10-CM

## 2012-01-08 DIAGNOSIS — I10 Essential (primary) hypertension: Secondary | ICD-10-CM

## 2012-01-08 DIAGNOSIS — M92529 Juvenile osteochondrosis of tibia tubercle, unspecified leg: Secondary | ICD-10-CM

## 2012-01-08 DIAGNOSIS — E785 Hyperlipidemia, unspecified: Secondary | ICD-10-CM

## 2012-01-08 DIAGNOSIS — IMO0001 Reserved for inherently not codable concepts without codable children: Secondary | ICD-10-CM

## 2012-01-08 LAB — POCT GLYCOSYLATED HEMOGLOBIN (HGB A1C): Hemoglobin A1C: 10.6

## 2012-01-08 MED ORDER — LISINOPRIL-HYDROCHLOROTHIAZIDE 20-25 MG PO TABS: 1.0000 | ORAL_TABLET | Freq: Every day | ORAL | Status: DC

## 2012-01-08 MED ORDER — INSULIN GLARGINE 100 UNIT/ML ~~LOC~~ SOLN: 60.0000 [IU] | Freq: Every day | SUBCUTANEOUS | Status: DC

## 2012-01-08 MED ORDER — ATORVASTATIN CALCIUM 20 MG PO TABS: 20.0000 mg | ORAL_TABLET | Freq: Every day | ORAL | Status: DC

## 2012-01-08 MED ORDER — CLONAZEPAM 0.5 MG PO TABS: 0.5000 mg | ORAL_TABLET | Freq: Two times a day (BID) | ORAL | Status: DC | PRN

## 2012-01-08 NOTE — Assessment & Plan Note (Signed)
Increased Lantus to 60 U daily.  A1C today - 10.6 Offered and encouraged patient to see Dr. Valentina Lucks regarding Diabetes education and starting bolus insulin.  Patient declined and seem unwilling to make changes/take initiative.  I expressed my concern regarding poor diabetes control.

## 2012-01-08 NOTE — Progress Notes (Signed)
Subjective:     Patient ID: Alicia West, female   DOB: 06-16-1970, 41 y.o.   MRN: [id number]  HPI Alicia West presents to the clinic today for follow up regarding DM-2, HTN, and Hyperlipidemia.  She is also in need of medication refills.  1) DM-2 - uncontrolled - Currently on Lantus 55 U and Glucotrol (10 mg in am, 5 mg in pm) - No longer taking Metformin due to side effects (nausea, diarrhea) - Last A1C was 11.3 - Alicia West reports that she checks her blood sugar approximately 3x/week.  Fasting blood sugars - 150 - 300's. - She also reports hypoglycemia approximately 3x/month.  Always has symptoms - shaky, sweating, palpitations - ROS: reports polyuria, and finger/toe numbness/tingling.  Denies polydipsia, polyphagia.  2) HTN - Checks BP occasionally at home. Range 130-150's/80's-90's - Is currently on Lisinopril-HCTZ 20-25 daily - ROS: denies dizziness, lightheadedness, chest pain, LE edema.  Reports some SOB.    3) HLD - On Lipitor 20 mg daily.  Has been on it for years. - ROS: occasional muscle soreness noted.    Social Hx - Nonsmoker.  Review of Systems See HPI    Objective:   Physical Exam  Filed Vitals:   01/08/12 1339  BP: 149/98  Pulse: 94  Temp: 98.7 F (37.1 C)   General: well appearing, NAD. Neck: supple, no thyromegaly. Heart: RRR, no murmurs, rubs, or gallops. Lungs: CTAB. No rales, rhonchi, or wheeze. Abdomen: soft, nontender, nondistended. No organomegaly. Extremities: no cyanosis, clubbing, or edema. Skin: no rashes or lesions. Psych: normal mood and affect. Neuro: no focal deficits.    Assessment:         Plan:

## 2012-01-08 NOTE — Assessment & Plan Note (Signed)
Will continue Atorvastatin 20 mg daily.  Refilled today. Obtained LDL today.  Will increase statin based on results. Also obtained CMP today.

## 2012-01-08 NOTE — Assessment & Plan Note (Addendum)
Patient requested Mobic today.  Given uncontrolled diabetes and impact of long term use on Kidney function I deferred not to prescribed. Patient was upset about this and seemed to shut down following refusal to prescribe.

## 2012-01-08 NOTE — Assessment & Plan Note (Signed)
Refilled Klonopin today.

## 2012-01-08 NOTE — Assessment & Plan Note (Signed)
Refilled patient's Lisinopril-HCTZ.  I wanted to start CCB today but when I addressed side effects (primarily LE edema) patient refused.  Will continue current regimen at this time.  Will consider adding Beta Blocker or Hydralazine at next visit.

## 2012-01-08 NOTE — Patient Instructions (Addendum)
It was nice meeting you today.  I refilled your BP medicine and your Lipitor.  I increased your Lantus to 60 U daily. I would like you to see our pharmacist Dr. Valentina Lucks regarding your diabetes.  If you change your mind please call our office.  I will send you a letter regarding your lab results.   Continue to check your sugars regularly.  Have a nice day!

## 2012-01-09 LAB — COMPLETE METABOLIC PANEL WITH GFR
ALT: 12 U/L (ref 0–35)
AST: 13 U/L (ref 0–37)
Albumin: 4.2 g/dL (ref 3.5–5.2)
CO2: 30 mEq/L (ref 19–32)
Calcium: 10.1 mg/dL (ref 8.4–10.5)
Chloride: 103 mEq/L (ref 96–112)
Creat: 0.77 mg/dL (ref 0.50–1.10)
GFR, Est African American: >89 mL/min
Potassium: 3.5 mEq/L (ref 3.5–5.3)
Sodium: 141 mEq/L (ref 135–145)
Total Protein: 8.1 g/dL (ref 6.0–8.3)

## 2012-02-18 ENCOUNTER — Encounter: Payer: Self-pay | Admitting: Family Medicine

## 2012-02-18 NOTE — Telephone Encounter (Signed)
This encounter was created in error - please disregard.

## 2012-02-18 NOTE — Telephone Encounter (Signed)
error 

## 2012-02-20 ENCOUNTER — Telehealth: Payer: Self-pay | Admitting: *Deleted

## 2012-02-20 NOTE — Telephone Encounter (Signed)
Patient completed form to request PCP change on 02/12/12.  Reason for request:  "Patient did not get along with this doctor.  He kept questioning her like she didn't know what was going on.  She needs another appointment, but doesn't want to see Dr. Lacinda Axon.  Would like to go back to [name]."    Attempted to call patient back at both phone numbers listed in [place].  Both numbers out of service.  Will await call back from patient.  Routed note to Dr. Lacinda Axon, Dr. Erin Hearing, and Dr. Gwenlyn Saran.  Nolene Ebbs, RN

## 2012-02-29 ENCOUNTER — Telehealth: Payer: Self-pay | Admitting: *Deleted

## 2012-02-29 NOTE — Telephone Encounter (Signed)
Received call from [place] and need clarification on quantity of Lantus .   Doctor stated on RX 45 ml but put in 2 boxes . This would actually be 3 boxes.   Also they would like to give her 3 months supply. Will give 6 boxes for 3 month supply.

## 2012-03-11 NOTE — Telephone Encounter (Signed)
Discussed with [name].  Will have patient go through PCP change protocol.  Will call patient for additional info and have Dr. Lacinda Axon follow up  with Dr. Gwenlyn Saran.  Called patient for additional info on request to change PCP.  Left message to call our office back.   Nolene Ebbs, RN

## 2012-03-13 NOTE — Telephone Encounter (Signed)
Returned call to patient and left message to call our office back.  Nolene Ebbs, RN

## 2012-03-13 NOTE — Telephone Encounter (Signed)
Discussed with Dr. Lacinda Axon.  If [name] okay with the switch, Dr. Lacinda Axon agrees to call patient, offer an apology for not meeting her expectations and letting her know that she can return to [name].  If [name] is not an option, please let me know.  Dr. Lacinda Axon will close the loop by documenting the phone note and sending it to [facility], me and [name].

## 2012-03-13 NOTE — Telephone Encounter (Signed)
Pt is returning call to [facility]

## 2012-03-13 NOTE — Telephone Encounter (Signed)
Spoke with patient.  Patient states she is requesting to change to another PCP (preferably Mingo Amber) because of recent office visit.  Patient had been x-ray that showed right knee fracture.  Was referred to [facility] per [name].  Patient states the fracture is not healing due to her being diabetic.  Patient states she had paperwork and info should be in Epic, but Dr. Lacinda Axon was "questioning me" and "did not like how Dr. Lacinda Axon was talking to me."  Patient states she and Dr. Lacinda Axon were "going back and forth" about BP med and ibuprofen Rx.  "I don't want a doctor I have to argue with."  Patient states that she needs to return for follow-up appt because her knee is still hurting, but she does not want to see Dr. Lacinda Axon. Also, needs referral back to [facility] due to hx of macular degeneration.  Will route note to Dr. Lacinda Axon, Dr. Erin Hearing, and Dr. Gwenlyn Saran.  Due to patient's knee pain, I will try to call patient back with an up on the PCP change process.  Nolene Ebbs, RN

## 2012-03-13 NOTE — Telephone Encounter (Signed)
Will route request to [name] and discuss with him tomorrow.  Nolene Ebbs, RN

## 2012-03-14 NOTE — Telephone Encounter (Signed)
See Note from Dr. Gwenlyn Saran.  I will contact the patient and discuss with her.  Will give her option of trying to work things out with me or she can re-establish with [name]

## 2012-03-14 NOTE — Telephone Encounter (Signed)
Will discuss guidelines with Dr. Gwenlyn Saran.  Will also route note back to Dr. Lacinda Axon to contact patient per discussion with Dr. Gwenlyn Saran.  Nolene Ebbs, RN

## 2012-03-14 NOTE — Telephone Encounter (Signed)
That's fine I can pick her up.  Based on historical precedent and the "Changing PCP" Guidelines posted on the Wiki, however, it seems we skipped the step of having Dr. Lacinda Axon contact the patient directly or schedule an office visit to see if things could be worked out between them.

## 2012-03-17 ENCOUNTER — Ambulatory Visit (INDEPENDENT_AMBULATORY_CARE_PROVIDER_SITE_OTHER): Payer: No Typology Code available for payment source | Admitting: Family Medicine

## 2012-03-17 ENCOUNTER — Encounter: Payer: Self-pay | Admitting: Family Medicine

## 2012-03-17 ENCOUNTER — Telehealth: Payer: Self-pay | Admitting: Family Medicine

## 2012-03-17 VITALS — BP 157/100 | HR 92 | Ht 69.0 in | Wt 265.0 lb

## 2012-03-17 DIAGNOSIS — H3530 Unspecified macular degeneration: Secondary | ICD-10-CM

## 2012-03-17 DIAGNOSIS — M92529 Juvenile osteochondrosis of tibia tubercle, unspecified leg: Secondary | ICD-10-CM

## 2012-03-17 DIAGNOSIS — M928 Other specified juvenile osteochondrosis: Secondary | ICD-10-CM

## 2012-03-17 DIAGNOSIS — I10 Essential (primary) hypertension: Secondary | ICD-10-CM

## 2012-03-17 HISTORY — DX: Unspecified macular degeneration: H35.30

## 2012-03-17 MED ORDER — MELOXICAM 7.5 MG PO TABS: 7.5000 mg | ORAL_TABLET | Freq: Every day | ORAL | Status: DC

## 2012-03-17 NOTE — Progress Notes (Signed)
Alicia West is a 42 y.o. female who presents to [facility] today for several concerns:  1.  Right knee pain:  Still present.  Describes as 8-10/10 in pain.  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  Hx/o childhood OS disease.  She states tat Mobic has greatly improved her pain, but she has been out of this.  States that Pamprin has actually provided her with some, but not much, relief.  She is now too heavy to wear Body Helix brace.  Continues using her home exercises.  Would like to be referred back to [name] for U/S of knee to examine and see if it's improved at all.  2.  Macular degeneration:  Long-term problem.  Seen at [name], recommended for procedure but she did not follow up with this.  Would like to be referred back as vision is continuing to worsen over past 2-3 years.  Described "gray fuzziness" center of her vision.  No acute worsening.  No eye redness or pain.   3.  Hypertension:  Long-term problem for this patient.  No adverse effects from medication.  Not checking it regularly.  No HA, CP, dizziness, shortness of breath, palpitations, or LE swelling.  Did not take her blood pressure medications today.   BP Readings from Last 3 Encounters:  03/17/12 157/100  01/08/12 149/98  06/01/11 150/110       The following portions of the patient's history were reviewed and updated as appropriate: allergies, current medications, past medical history, family and social history, and problem list.  Patient is a nonsmoker.  Past Medical History  Diagnosis Date  . Diabetes mellitus   . Hypertension   . Herpes simplex labialis   . TB (tuberculosis) contact     + skin test,/- chest x-ray    ROS as above otherwise neg. No Chest pain, palpitations, SOB, Fever, Chills, Abd pain, N/V/D.  Medications reviewed. Current Outpatient Prescriptions  Medication Sig Dispense Refill  . atorvastatin (LIPITOR) 20 MG tablet Take 1 tablet (20 mg total) by mouth daily.  90  tablet  3  . Blood Glucose Monitoring Suppl ([place]) W/DEVICE KIT by Does not apply route.        . Cetirizine HCl 10 MG TBDP Take 1 tablet by mouth daily as needed. Sneezing and coughing      . clonazePAM (KLONOPIN) 0.5 MG tablet Take 0.5 mg by mouth 2 (two) times daily as needed. For anxiety       . clonazePAM (KLONOPIN) 0.5 MG tablet Take 1 tablet (0.5 mg total) by mouth 2 (two) times daily as needed for anxiety.  30 tablet  3  . fluticasone (FLONASE) 50 MCG/ACT nasal spray Place 2 sprays into the nose 2 (two) times daily as needed. For allergies      . glipiZIDE (GLUCOTROL) 10 MG tablet Take 5-10 mg by mouth 2 (two) times daily. Take 1 tab in the morning and 1/2 tablet by mouth in the evening      . glucose blood (ONE TOUCH ULTRA TEST) test strip 1 each by Other route daily. Use as instructed       . insulin glargine (LANTUS) 100 UNIT/ML injection Inject 60 Units into the skin daily. Disp: 2 box  45 mL  3  . lisinopril-hydrochlorothiazide (PRINZIDE,ZESTORETIC) 20-25 MG per tablet Take 1 tablet by mouth daily.  90 tablet  3  . meloxicam (MOBIC) 7.5 MG tablet Take 1 tablet (7.5 mg total) by mouth daily.  [place]  tablet  1   Current Facility-Administered Medications  Medication Dose Route Frequency Provider Last Rate Last Dose  . TDaP (BOOSTRIX) injection 0.5 mL  0.5 mL Intramuscular Once Alveda Reasons, MD        Exam:  BP 157/100  Pulse 92  Ht 5' 9"  (1.753 m)  Wt 265 lb (120.203 kg)  BMI 39.12 kg/m2  LMP 01/19/2012 Gen:  Alert, cooperative patient who appears stated age in no acute distress.  Vital signs reviewed. HEENT: EOMI.  No scleral injection/icterus MMM Lungs: CTABL Nl WOB Heart: RRR no MRG Abd: NABS, NT, ND.  Obese Exts: Non edematous BL  LE, warm and well perfused.  Knee:  Left knee WNL.  Right knee with no joint laxity noted, no warmth, no swelling.  TTP directly over tibial tubercle.    No results found for this or any previous visit (from the past 72  hour(s)).

## 2012-03-17 NOTE — Assessment & Plan Note (Signed)
Not at goal.  Recommended patient RTC for nurse visit in 2-3 weeks after she has taken her blood pressure medication. Warned her that long-term NSAID usage can cause elevation/worsening of HTN.   May need medication adjustment after recheck.

## 2012-03-17 NOTE — Assessment & Plan Note (Signed)
She tried to make an appt on her own but was unable to.  She has some "referral paper" she is supposed to call to be seen at [facility], said she should email that number here and we can see if we can help her be seen. Otherwise will need referral to [facility] as she has [employer].

## 2012-03-17 NOTE — Telephone Encounter (Signed)
Will address policy via email.  Sounds like we have a plan moving forward.  Thanks to Dr. Lacinda Axon for being so attentive to this.

## 2012-03-17 NOTE — Telephone Encounter (Signed)
Left message for patient.  Apologized for her dissatisfaction with her care.  Gave option of working things out with me or seeing [name].  Will await follow up call.  Will attempt to call again if time permits.

## 2012-03-17 NOTE — Patient Instructions (Signed)
Take the Meloxicam once a day for relief of your knee pain.  Make an appointment to be seen at [facility] in the next week or two. Keep doing the strengthening exercises.  Give [place] a call with the referral number/information on your sheet so we can get you seen for your eyes.  Come back in 2-3 weeks for a nurse visit to have your blood pressure checked.  Take your medicine before coming in.    It was good to see you today

## 2012-03-17 NOTE — Assessment & Plan Note (Signed)
Refilled for Mobic today.  Short-term use of this medication secondary to long-standing DM and HTN.   Told her she can go back to [facility] this week or next week, doesn't need referral.  To make appt on way out.

## 2012-03-17 NOTE — Telephone Encounter (Signed)
Left message for patient today.  Apologized for dissatisfaction with care.  Gave option of giving it another try with me or changing to [name].  Will await phone call from patient.

## 2012-03-18 ENCOUNTER — Telehealth: Payer: Self-pay | Admitting: Family Medicine

## 2012-03-18 ENCOUNTER — Encounter: Payer: Self-pay | Admitting: Sports Medicine

## 2012-03-18 ENCOUNTER — Ambulatory Visit (INDEPENDENT_AMBULATORY_CARE_PROVIDER_SITE_OTHER): Payer: No Typology Code available for payment source | Admitting: Sports Medicine

## 2012-03-18 ENCOUNTER — Ambulatory Visit: Payer: No Typology Code available for payment source | Admitting: Sports Medicine

## 2012-03-18 VITALS — BP 154/112 | HR 102 | Ht 69.0 in | Wt 265.0 lb

## 2012-03-18 DIAGNOSIS — M7650 Patellar tendinitis, unspecified knee: Secondary | ICD-10-CM

## 2012-03-18 DIAGNOSIS — M25561 Pain in right knee: Secondary | ICD-10-CM

## 2012-03-18 DIAGNOSIS — H3530 Unspecified macular degeneration: Secondary | ICD-10-CM

## 2012-03-18 DIAGNOSIS — M25569 Pain in unspecified knee: Secondary | ICD-10-CM

## 2012-03-18 MED ORDER — AMLODIPINE BESYLATE 5 MG PO TABS: 5.0000 mg | ORAL_TABLET | Freq: Every day | ORAL | Status: DC

## 2012-03-18 NOTE — Telephone Encounter (Signed)
Patient is calling because her BP's are still running high so she is hoping that [name] would prescribe a 3rd Blood Pressure medication.  She also needs a referral to [facility].

## 2012-03-18 NOTE — Progress Notes (Signed)
  Subjective:    Patient ID: Alicia West, female    DOB: 15-Aug-1970, 42 y.o.   MRN: [id number]  HPI chief complaint: Right knee pain  42 year old comes in today complaining of long-standing right knee pain. She was last seen in the sports medicine office in 2012. An ultrasound at that time showed some fragmentation of the tibial tubercle consistent with old Osgood-Schlatter's. She was given a body helix strap and Mobic and she states that these both improved her symptoms dramatically. Since that office visit, she has gained a significant amount of weight and her body helix strap no longer fits. She saw her PCP yesterday who phoned in some more Mobic for her she has yet to pick it up. All of her pain is in the anterior knee. It is worse with activity. She does get some feelings of instability. Denies any joint swelling. No prior knee surgeries. No recent trauma.  As medical history as well as current medications are reviewed She is allergic to penicillin    Review of Systems     Objective:   Physical Exam Obese 42 year old female. No acute distress.  Right knee: Full range of motion. No effusion. There is tenderness to palpation along the distal patellar tendon as well as over the tibial tubercle. There is prominence of the tibial tubercle as well. Slight crepitus to palpation along the course of the patellar tendon. Pain with resisted leg extension. Minimal patellofemoral crepitus. The knee is stable to valgus and varus stressing. Negative anterior drawer. Negative posterior drawer. Negative McMurray's. Neurovascularly intact distally. Walks with a very slight limp.  MSK ultrasound of the right knee: Patellar tendon is slightly thickened at 0.63 cm. There is persistent fragmentation at the tibial tubercle and hypoechoic changes in the distal patellar tendon consistent with probable degenerative tearing here. These changes are seen on both long and short view. However, there is minimal  neovascularity.       Assessment & Plan:  1. 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 She is too large for a new body helix strap so we will instead try to use [place] and I have instructed her in decline squats which she is to perform daily. She will get the Mobic that has been called into her pharmacy by her PCP and she will followup with me in 4 weeks. We may consider a trial of topical nitroglycerin if symptoms persist. Call with questions or concerns in the interim.

## 2012-03-18 NOTE — Telephone Encounter (Signed)
We can try Norvasc 5 mg.  I've sent this in to [facility].  She should come back in 1-2 weeks for blood pressure recheck with a nurse.  I'd like to see her about 1-2 weeks after that.   Also, since she has the [employer] (and Butch Penny correct if I'm wrong), I think she'll have to pay out of pocket to be seen by [facility].  She can try [facility] or [facility] as a referral but it's about $250 to be seen by them at the first visit.  They can establish a payment plan similar to our [facility] after that.    Would you guys mind calling and telling her this?  Merry Proud

## 2012-03-18 NOTE — Telephone Encounter (Signed)
[name], please put referral in for [facility]. They are contracted with [facility].  Lauralyn Primes

## 2012-03-18 NOTE — Telephone Encounter (Signed)
[facility] is contracted with [facility] so we can send in this referral to them and try to get her scheduled.

## 2012-03-24 NOTE — Telephone Encounter (Signed)
Spoke with [name].  She has already picked up the Norvasc from the pharmacy.  Also already has an appointment scheduled to see the nurse for BP check.  I also informed her that [name] had placed the referral for [facility] but it would not be processed until March due to all the ophthalmology slots with [facility] has been filled for February.  Patient states understanding.  Alicia West

## 2012-03-27 NOTE — Telephone Encounter (Signed)
Patient prefers to switch back to [name].  Changed PCP in Epic.  Nolene Ebbs, RN

## 2012-04-02 ENCOUNTER — Ambulatory Visit (HOSPITAL_COMMUNITY)
Admission: RE | Admit: 2012-04-02 | Discharge: 2012-04-02 | Disposition: A | Discharge status: Home / Self Care | Payer: No Typology Code available for payment source | Source: Ambulatory Visit | Attending: Sports Medicine | Admitting: Sports Medicine

## 2012-04-02 ENCOUNTER — Ambulatory Visit (INDEPENDENT_AMBULATORY_CARE_PROVIDER_SITE_OTHER): Payer: No Typology Code available for payment source | Admitting: *Deleted

## 2012-04-02 DIAGNOSIS — M25561 Pain in right knee: Secondary | ICD-10-CM

## 2012-04-02 DIAGNOSIS — M25569 Pain in unspecified knee: Secondary | ICD-10-CM | POA: Insufficient documentation

## 2012-04-02 DIAGNOSIS — I10 Essential (primary) hypertension: Secondary | ICD-10-CM

## 2012-04-02 IMAGING — CR DG KNEE 3 VIEWS*R*
2 series · 2 of 2 positions shown · non-contrast
Comparison: None

CLINICAL DATA: Right knee pain.

RIGHT KNEE - 3 VIEW

[t knee ap right]
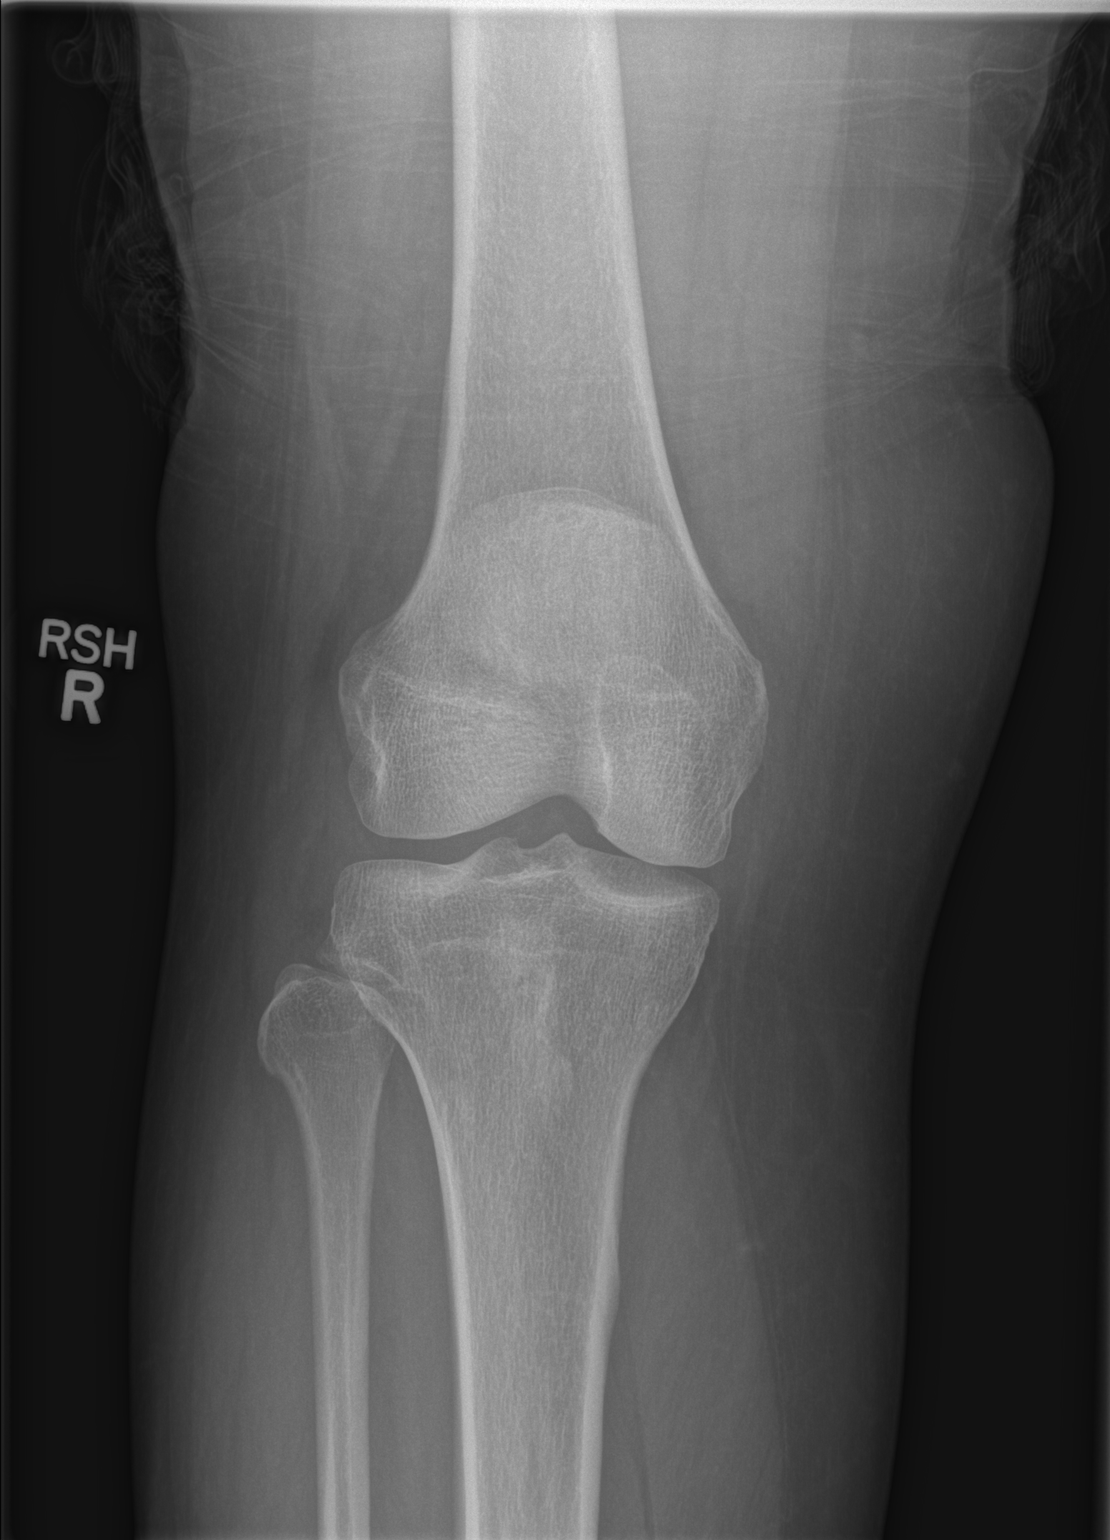

[t knee lat right]
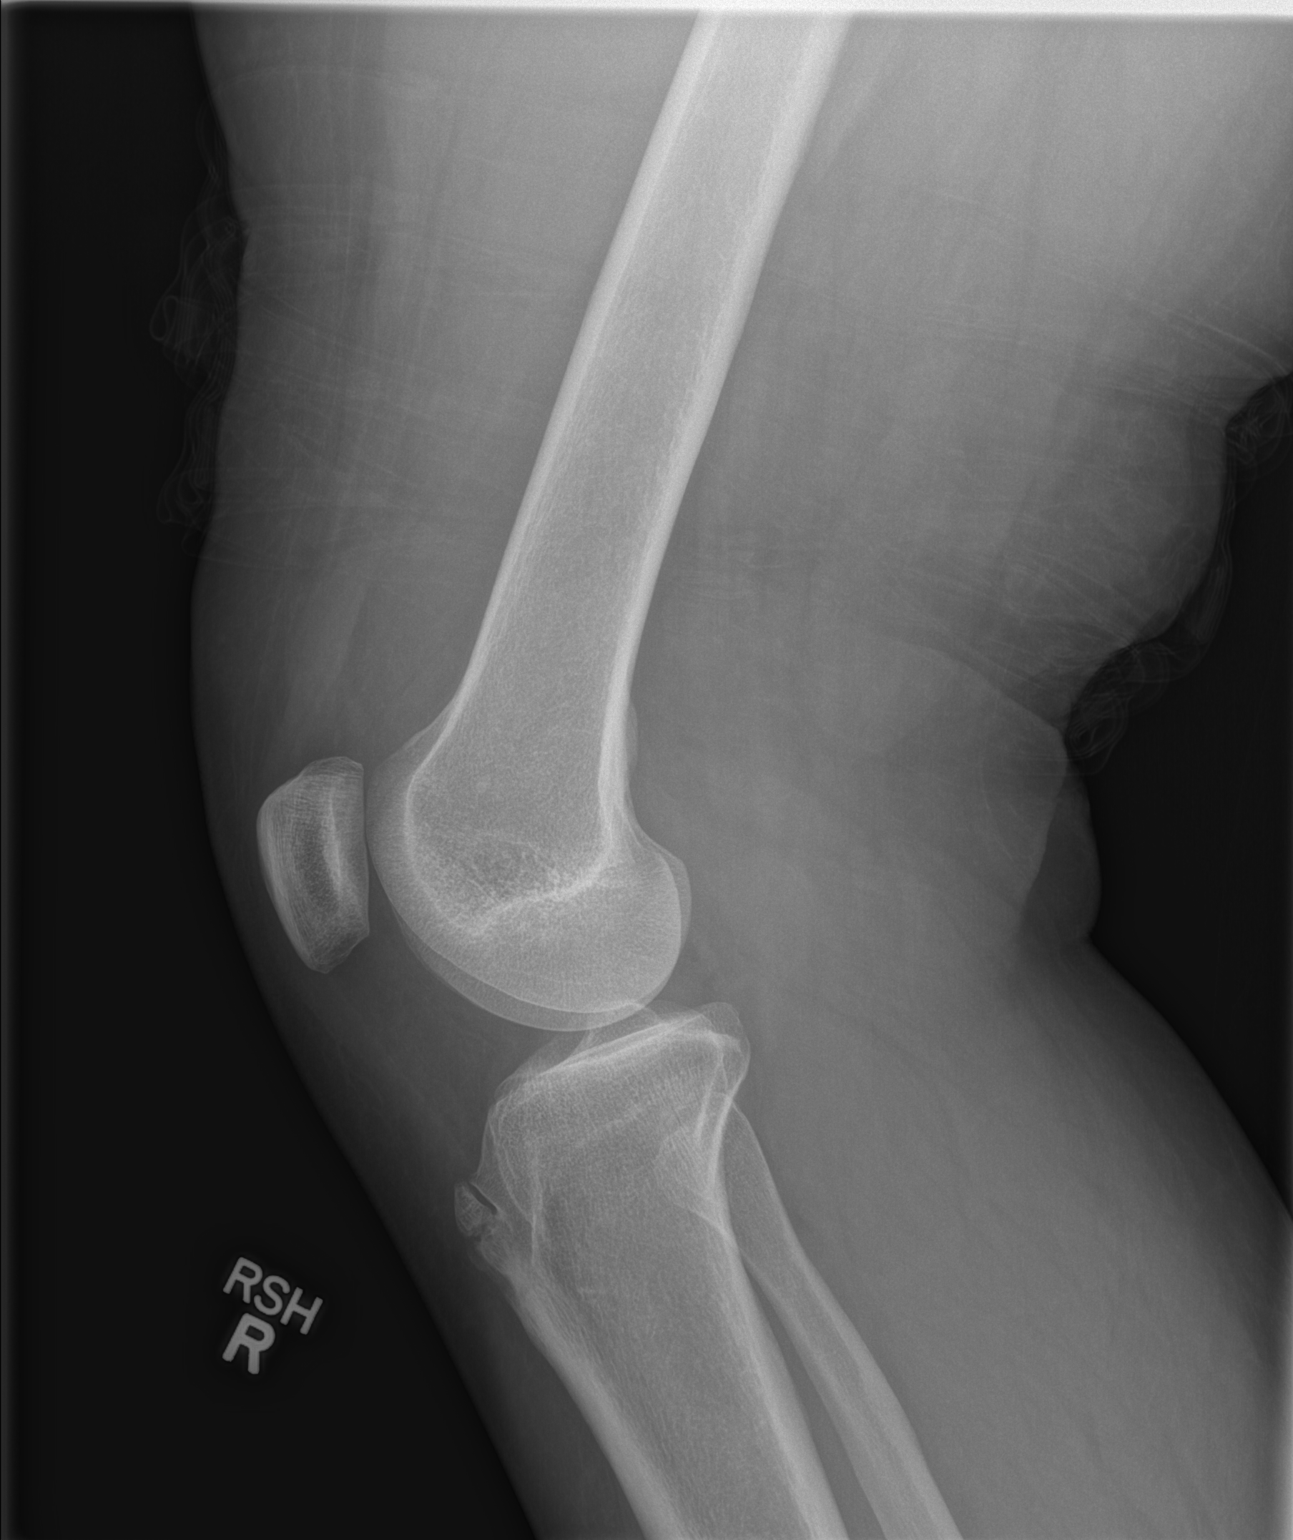

[2 of 2 positions shown; findings below may reference images not displayed]

FINDINGS: No acute bony abnormality.  Specifically, no fracture,
subluxation, or dislocation.  Soft tissues are intact.  No joint
effusion.  Joint spaces are maintained.  Irregularity along the
anterior tibial tuberosity likely developmental or related to old
injury.
IMPRESSION: No acute bony abnormality.

## 2012-04-02 NOTE — Progress Notes (Signed)
Patient in earlier today for BP check.  BP checked manually using large adult cuff.  BP LA 132/92 and RA 128/90. Pulse 118. Denies any chest pain or feeling that heart is pounding. She did take meds as directed today.  States she was a bit upset because she had to wait for so long to get BP checked.  She was called back 25 minutes after check in. I apologized for this inconvenience. She was very understanding but thought maybe increased pulse rate was due to this. Consulted with [name].  Patient has follow up appointment on 03/07 with [name] . He advises to continue current meds  and to check BP and pulse outside of office . Call sooner if needed.

## 2012-04-11 ENCOUNTER — Ambulatory Visit: Payer: No Typology Code available for payment source | Admitting: Family Medicine

## 2012-04-14 ENCOUNTER — Ambulatory Visit: Payer: No Typology Code available for payment source | Admitting: Sports Medicine

## 2012-04-16 ENCOUNTER — Ambulatory Visit: Payer: No Typology Code available for payment source | Admitting: Sports Medicine

## 2012-04-28 ENCOUNTER — Encounter: Payer: Self-pay | Admitting: *Deleted

## 2012-04-28 ENCOUNTER — Encounter: Payer: No Typology Code available for payment source | Admitting: Family Medicine

## 2012-05-15 ENCOUNTER — Other Ambulatory Visit: Payer: Self-pay | Admitting: Family Medicine

## 2012-05-15 MED ORDER — MOMETASONE FUROATE 50 MCG/ACT NA SUSP: 2.0000 | Freq: Every day | NASAL | Status: DC

## 2012-05-26 ENCOUNTER — Other Ambulatory Visit: Payer: Self-pay | Admitting: *Deleted

## 2012-05-26 ENCOUNTER — Telehealth: Payer: Self-pay | Admitting: Family Medicine

## 2012-05-26 MED ORDER — AMLODIPINE BESYLATE 5 MG PO TABS: 5.0000 mg | ORAL_TABLET | Freq: Every day | ORAL | Status: DC

## 2012-05-26 NOTE — Telephone Encounter (Signed)
Left message for [place] that referral was faxed to [facility] on 05/06/2012.  Referral was faxed back a couple of weeks later stating all slots had been scheduled for April and to resubmit in May.  Advised referrals thru [facility] for the orange card can take a few month to get an appointment.  Lauralyn Primes

## 2012-05-26 NOTE — Telephone Encounter (Signed)
Pt is asking about referral for her eye appt.

## 2012-06-17 NOTE — Telephone Encounter (Signed)
Pt has been waiting since Feb for an eye appt and wants to know what the status is.  She has Mac/degen and says it's getting worse.  Wants to know if [place] [facility] will take orange card.  pls advise

## 2012-06-17 NOTE — Telephone Encounter (Signed)
Spoke with [name] at [facility].  Alicia West has been scheduled at [facility] 07/02/2012 @ 10:00am.. There is a $20 co-pay.  [name] also states that patient had a appointment there previously and no-showed.  If she no-shows this appointment, she will not get another referral thru [facility].  [name] will call patient with appointment information.    Lauralyn Primes

## 2012-07-16 ENCOUNTER — Ambulatory Visit: Payer: No Typology Code available for payment source | Admitting: Family Medicine

## 2012-07-25 ENCOUNTER — Ambulatory Visit (INDEPENDENT_AMBULATORY_CARE_PROVIDER_SITE_OTHER): Payer: No Typology Code available for payment source | Admitting: Family Medicine

## 2012-07-25 ENCOUNTER — Encounter: Payer: Self-pay | Admitting: Family Medicine

## 2012-07-25 VITALS — BP 128/81 | HR 98 | Temp 99.4°F | Ht 69.0 in | Wt 266.0 lb

## 2012-07-25 DIAGNOSIS — S81809A Unspecified open wound, unspecified lower leg, initial encounter: Secondary | ICD-10-CM

## 2012-07-25 DIAGNOSIS — IMO0001 Reserved for inherently not codable concepts without codable children: Secondary | ICD-10-CM

## 2012-07-25 DIAGNOSIS — M79672 Pain in left foot: Secondary | ICD-10-CM

## 2012-07-25 DIAGNOSIS — M25572 Pain in left ankle and joints of left foot: Secondary | ICD-10-CM

## 2012-07-25 DIAGNOSIS — E1165 Type 2 diabetes mellitus with hyperglycemia: Secondary | ICD-10-CM

## 2012-07-25 DIAGNOSIS — S81802A Unspecified open wound, left lower leg, initial encounter: Secondary | ICD-10-CM

## 2012-07-25 DIAGNOSIS — M25579 Pain in unspecified ankle and joints of unspecified foot: Secondary | ICD-10-CM

## 2012-07-25 DIAGNOSIS — S81009A Unspecified open wound, unspecified knee, initial encounter: Secondary | ICD-10-CM

## 2012-07-25 DIAGNOSIS — M79609 Pain in unspecified limb: Secondary | ICD-10-CM

## 2012-07-25 DIAGNOSIS — E785 Hyperlipidemia, unspecified: Secondary | ICD-10-CM

## 2012-07-25 LAB — COMPREHENSIVE METABOLIC PANEL
ALT: 12 U/L (ref 0–35)
AST: 12 U/L (ref 0–37)
Albumin: 3.9 g/dL (ref 3.5–5.2)
BUN: 12 mg/dL (ref 6–23)
CO2: 28 mEq/L (ref 19–32)
Calcium: 9.9 mg/dL (ref 8.4–10.5)
Chloride: 101 mEq/L (ref 96–112)
Creat: 0.88 mg/dL (ref 0.50–1.10)
Potassium: 3.9 mEq/L (ref 3.5–5.3)

## 2012-07-25 LAB — CBC
Hemoglobin: 12.1 g/dL (ref 12.0–15.0)
MCHC: 32.5 g/dL (ref 30.0–36.0)
RDW: 15.6 % — ABNORMAL HIGH (ref 11.5–15.5)
WBC: 8.3 10*3/uL (ref 4.0–10.5)

## 2012-07-25 LAB — LIPID PANEL
Cholesterol: 138 mg/dL (ref 0–200)
HDL: 35 mg/dL — ABNORMAL LOW (ref >39)
Triglycerides: 134 mg/dL (ref <150)

## 2012-07-25 MED ORDER — TRAMADOL HCL 50 MG PO TABS: 50.0000 mg | ORAL_TABLET | Freq: Three times a day (TID) | ORAL | Status: DC | PRN

## 2012-07-25 MED ORDER — AMLODIPINE BESYLATE 5 MG PO TABS: 5.0000 mg | ORAL_TABLET | Freq: Every day | ORAL | Status: DC

## 2012-07-25 MED ORDER — SULFAMETHOXAZOLE-TRIMETHOPRIM 800-160 MG PO TABS
1.0000 | ORAL_TABLET | Freq: Two times a day (BID) | ORAL | Status: DC
Start: 2012-07-25 — End: 2012-12-23

## 2012-07-25 MED ORDER — METFORMIN HCL 500 MG PO TABS: 500.0000 mg | ORAL_TABLET | Freq: Two times a day (BID) | ORAL | Status: DC

## 2012-07-25 MED ORDER — ATORVASTATIN CALCIUM 20 MG PO TABS: 20.0000 mg | ORAL_TABLET | Freq: Every day | ORAL | Status: DC

## 2012-07-25 NOTE — Progress Notes (Signed)
Subjective:    Alicia West is a 42 y.o. female who presents to [facility] today for several issues:  1.  LLE edema:  Occurred about 3 months ago after wearing girdle while at work. She has had recurrent left lower extremity edema mostly around her foot and ankle since that time. She states this really only started to become noticeable about 3 or 4 weeks ago. She denies any trauma to the area. No falls. 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 However at home she wears no shoes, only walks barefoot or with sandals.. No redness or to her calf or foot. No heat. No signs of infection. It is painful around the left lateral malleolus. She has been using Advil/Alleve without relief.  She's had no fevers or chills.  Pain worse with prolonged standing and walking. Relieved with elevation and rest. Edema also relieved with elevation rest.  2.  LLE wound: She had a scratch on the medial aspect of left calf about 6 months ago. This preceded any swelling her leg. Since that time it is healed but there will return with easy abrasion. She states that she has history of poor healing in the past. No purulence from the site. No surrounding redness. No pain. She would like it looked at. 3.  Diabetes:  Currently on Lantus 60 and Glipizide.  CBGs run from 150 in early AM to high 200's later in the day.    No adverse effects from medication.  No hypoglycemic events.  No paresthesia or peripheral nerve pain.  Measures blood sugars at home every:    Day (or so).  Has cut out sodas.  Last A1c as below. A1c today as higher at 11. Admits to being "several days" where she does not take her medications. She had trouble with metformin the past stating that it caused myalgias. Lab Results  Component Value Date   HGBA1C 10.6 01/08/2012     The following portions of the patient's history were reviewed and updated as appropriate: allergies, current medications, past medical history, family and social history, and  problem list. Patient is a nonsmoker.    PMH reviewed.  Past Medical History  Diagnosis Date  . Diabetes mellitus   . Hypertension   . Herpes simplex labialis   . TB (tuberculosis) contact     + skin test,/- chest x-ray   No past surgical history on file.  Medications reviewed. Current Outpatient Prescriptions  Medication Sig Dispense Refill  . amLODipine (NORVASC) 5 MG tablet Take 1 tablet (5 mg total) by mouth daily.  30 tablet  1  . atorvastatin (LIPITOR) 20 MG tablet Take 1 tablet (20 mg total) by mouth daily.  90 tablet  3  . Blood Glucose Monitoring Suppl ([place]) W/DEVICE KIT by Does not apply route.        . Cetirizine HCl 10 MG TBDP Take 1 tablet by mouth daily as needed. Sneezing and coughing      . clonazePAM (KLONOPIN) 0.5 MG tablet Take 0.5 mg by mouth 2 (two) times daily as needed. For anxiety       . clonazePAM (KLONOPIN) 0.5 MG tablet Take 1 tablet (0.5 mg total) by mouth 2 (two) times daily as needed for anxiety.  30 tablet  3  . fluticasone (FLONASE) 50 MCG/ACT nasal spray Place 2 sprays into the nose 2 (two) times daily as needed. For allergies      . glipiZIDE (GLUCOTROL) 10  MG tablet Take 5-10 mg by mouth 2 (two) times daily. Take 1 tab in the morning and 1/2 tablet by mouth in the evening      . glucose blood (ONE TOUCH ULTRA TEST) test strip 1 each by Other route daily. Use as instructed       . insulin glargine (LANTUS) 100 UNIT/ML injection Inject 60 Units into the skin daily. Disp: 2 box  45 mL  3  . lisinopril-hydrochlorothiazide (PRINZIDE,ZESTORETIC) 20-25 MG per tablet Take 1 tablet by mouth daily.  90 tablet  3  . meloxicam (MOBIC) 7.5 MG tablet Take 1 tablet (7.5 mg total) by mouth daily.  30 tablet  1  . mometasone (NASONEX) 50 MCG/ACT nasal spray Place 2 sprays into the nose daily.  17 g  11   Current Facility-Administered Medications  Medication Dose Route Frequency Provider Last Rate Last Dose  . TDaP (BOOSTRIX) injection 0.5 mL  0.5  mL Intramuscular Once Alveda Reasons, MD        ROS as above otherwise neg.  No chest pain, palpitations, SOB, Fever, Chills, Abd pain, N/V/D.   Objective:   Physical Exam BP 128/81  Pulse 98  Temp(Src) 99.4 F (37.4 C) (Oral)  Ht 5' 9"  (1.753 m)  Wt 266 lb (120.657 kg)  BMI 39.26 kg/m2 Gen:  Alert, cooperative patient who appears stated age in no acute distress.  Vital signs reviewed. HEENT: EOMI,  MMM Cardiac:  Regular rate and rhythm without murmur auscultated.  Good S1/S2. Pulm:  Clear to auscultation bilaterally with good air movement.  No wheezes or rales noted.   Exts: Right lower extremity within normal limits. Left lower extremity with +1 minimal pitting edema around ankles. She is tender along the left lateral malleolus both superior and inferior to malleolus. She has pain with inversion of her ankle. No overlying redness or warmth. She is able to ambulate without limping. Skin: 1 cm chronic wound which is overlying scab noted medial aspect of left calf. No surrounding cellulitis or erythema. No underlying fluctuance. No induration. Neuro: Monofilament testing bilateral feet revealed intact sensation bilaterally  No results found for this or any previous visit (from the past 72 hour(s)).

## 2012-07-25 NOTE — Patient Instructions (Addendum)
For your wound, take the Bactrim twice a day.  Use the bandage on your foot.  I think you have a chronic ankle sprain.  Go over to the Radiology dept at [facility] for your xray.     We'll check labs today and let you know what it is.    We'll try the Metformin now.  Let me know how this does.  Take this once a day for a week, then try twice a day.    Sent in refills.

## 2012-07-26 DIAGNOSIS — M25572 Pain in left ankle and joints of left foot: Secondary | ICD-10-CM | POA: Insufficient documentation

## 2012-07-26 DIAGNOSIS — S81809A Unspecified open wound, unspecified lower leg, initial encounter: Secondary | ICD-10-CM | POA: Insufficient documentation

## 2012-07-26 NOTE — Assessment & Plan Note (Signed)
Her uncontrolled diabetes is not helping the situation. Bactrim to treat today although I'm not sure how helpful this will be. I discussed with her that getting her blood sugars under control will likely cause her wound to completely heal.

## 2012-07-26 NOTE — Assessment & Plan Note (Addendum)
Not controlled. After discussion today she would like to retry her metformin. We will do this and lower dose 500 mg once a day for a week to get her use it.  I think overall that she is not taking her insulin as prescribes -- endorses that her A1c in the past when she took her insulin regularly. Encouraged use of this today. We will then increase to twice a day dosing. Admits to not following current treatment plan. She has done well with cutting out sodas. CMET to check creatinine and LFTs today.   I did perform monofilament testing today.

## 2012-07-26 NOTE — Assessment & Plan Note (Signed)
Lipid panel today

## 2012-07-26 NOTE — Assessment & Plan Note (Signed)
This seems to have been genetic in nature. Exam consistent with sprain of left ankle. I have provided her with Ace bandage here in clinic. Recommended she followup in next 2-4 weeks. If this is not improving we can try postop shoe. She does have some exercises at home to strengthen her ankles and I recommended she continue these. If no improvement can consider physical therapy.

## 2012-07-29 ENCOUNTER — Telehealth: Payer: Self-pay | Admitting: Family Medicine

## 2012-07-29 NOTE — Telephone Encounter (Signed)
Patient is calling for the results of her blood work from Friday.

## 2012-07-29 NOTE — Telephone Encounter (Signed)
Will fwd to MD for review.  Anuar Walgren, Loralyn Freshwater, [place]

## 2012-07-30 MED ORDER — ATORVASTATIN CALCIUM 40 MG PO TABS: 40.0000 mg | ORAL_TABLET | Freq: Every day | ORAL | Status: DC

## 2012-07-30 NOTE — Telephone Encounter (Signed)
Pt notified.  Dason Mosley L, CMA  

## 2012-07-30 NOTE — Telephone Encounter (Signed)
We discussed her worsening A1C.  Only thing else on labs was elevated LDL ("bad cholesterol").  I am increasing the dose of her Lipitor.  She can take 2 of the pills she has until she runs out, then pick up the new prescription for 40 mg instead of 20 mg she is on now.

## 2012-08-14 ENCOUNTER — Other Ambulatory Visit: Payer: Self-pay

## 2012-08-26 ENCOUNTER — Telehealth: Payer: Self-pay | Admitting: Family Medicine

## 2012-08-26 NOTE — Telephone Encounter (Signed)
Patient is calling for a referral to go back to the eye doctor for a procedure done on her eyes.

## 2012-08-27 ENCOUNTER — Telehealth: Payer: Self-pay | Admitting: Family Medicine

## 2012-08-27 DIAGNOSIS — H3530 Unspecified macular degeneration: Secondary | ICD-10-CM

## 2012-08-27 NOTE — Telephone Encounter (Signed)
I called the patient. Left VM. In looking through her chart I see that she now sowerd one appt at [facility] opthalmology, a second appt was rescheduled for 07/02/2012 at 10 AM. The question is did she keep that appt?   If she did and this is routine f/u than she should be able to make her own appt or if a new referral is needed I will send it. If she did not keep appt, no new appts will be schedule thru [facility] due to appt no show x 2.   I asked the patient to call back with the above information.  Please get answer from patient when she calls back.

## 2012-08-27 NOTE — Telephone Encounter (Signed)
I spoke to Butch Penny there is a retinal specialist through [facility]. Referral placed.

## 2012-08-27 NOTE — Telephone Encounter (Signed)
Pt states that she didn't go to the appointments because she didn't have an appointment and told them this. She said the mailed letter from the eye doctor was put in someone else mailbox. So she wants the doctor to call her and give her another referral. I read her what Dr. Adrian Blackwater wrote. She has the [facility] card and knows that very few doctor take this. JW

## 2012-08-27 NOTE — Addendum Note (Signed)
Addended by: Boykin Nearing on: 08/27/2012 05:28 PM   Modules accepted: Orders

## 2012-08-27 NOTE — Telephone Encounter (Signed)
Patient reports that she went to appt at [facility] ophthalmology on  07/02/2012.   She was instructed to have a procedure done when she attempted to schedule the procedure she was informed that the doctor at [facility] (retinal specialist Dr. Starling Manns per referral note scanned into media section)  who does the procedure does not treat orange card patients. She was then informed by the clinic staff at [facility] that they will refer her to another physician at another practice who does the procedure. She attempted on multiple occassions to call [place] [facility] to get that information and is having great difficulty.  Plan: I will send this info to Dr. Cindra Presume nurses and our referral coordinator in order to assist the patient with getting more information regarding the procedure and the recommended physician.   If a new referral is needed I will place it.  Patient agrees with plan. Advised patient if she dose not hear back within 3 business days (09/01/12) she should call back.  Patient voiced understanding.

## 2012-09-17 ENCOUNTER — Ambulatory Visit: Payer: Self-pay

## 2012-10-27 ENCOUNTER — Other Ambulatory Visit: Payer: Self-pay | Admitting: Family Medicine

## 2012-10-27 MED ORDER — CLONAZEPAM 0.5 MG PO TABS: 0.5000 mg | ORAL_TABLET | Freq: Two times a day (BID) | ORAL | Status: DC | PRN

## 2012-12-09 ENCOUNTER — Encounter: Payer: Self-pay | Admitting: Family Medicine

## 2012-12-09 ENCOUNTER — Ambulatory Visit (INDEPENDENT_AMBULATORY_CARE_PROVIDER_SITE_OTHER): Payer: No Typology Code available for payment source | Admitting: Family Medicine

## 2012-12-09 ENCOUNTER — Ambulatory Visit (HOSPITAL_COMMUNITY)
Admission: RE | Admit: 2012-12-09 | Discharge: 2012-12-09 | Disposition: A | Discharge status: Home / Self Care | Payer: No Typology Code available for payment source | Source: Ambulatory Visit | Attending: Family Medicine | Admitting: Family Medicine

## 2012-12-09 VITALS — BP 125/85 | HR 107 | Temp 98.2°F | Ht 69.0 in | Wt 271.5 lb

## 2012-12-09 DIAGNOSIS — I10 Essential (primary) hypertension: Secondary | ICD-10-CM

## 2012-12-09 DIAGNOSIS — B001 Herpesviral vesicular dermatitis: Secondary | ICD-10-CM

## 2012-12-09 DIAGNOSIS — E1165 Type 2 diabetes mellitus with hyperglycemia: Secondary | ICD-10-CM

## 2012-12-09 DIAGNOSIS — R079 Chest pain, unspecified: Secondary | ICD-10-CM

## 2012-12-09 DIAGNOSIS — IMO0002 Reserved for concepts with insufficient information to code with codable children: Secondary | ICD-10-CM

## 2012-12-09 DIAGNOSIS — IMO0001 Reserved for inherently not codable concepts without codable children: Secondary | ICD-10-CM

## 2012-12-09 DIAGNOSIS — J309 Allergic rhinitis, unspecified: Secondary | ICD-10-CM

## 2012-12-09 DIAGNOSIS — B009 Herpesviral infection, unspecified: Secondary | ICD-10-CM

## 2012-12-09 DIAGNOSIS — S81801S Unspecified open wound, right lower leg, sequela: Secondary | ICD-10-CM

## 2012-12-09 DIAGNOSIS — J069 Acute upper respiratory infection, unspecified: Secondary | ICD-10-CM

## 2012-12-09 DIAGNOSIS — E785 Hyperlipidemia, unspecified: Secondary | ICD-10-CM

## 2012-12-09 MED ORDER — LEVOFLOXACIN 500 MG PO TABS: 500.0000 mg | ORAL_TABLET | Freq: Every day | ORAL | Status: DC

## 2012-12-09 MED ORDER — AMLODIPINE BESYLATE 5 MG PO TABS: 5.0000 mg | ORAL_TABLET | Freq: Every day | ORAL | Status: DC

## 2012-12-09 MED ORDER — HYDROCODONE-HOMATROPINE 5-1.5 MG/5ML PO SYRP: 5.0000 mL | ORAL_SOLUTION | Freq: Three times a day (TID) | ORAL | Status: DC | PRN

## 2012-12-09 NOTE — Progress Notes (Signed)
Subjective:    Alicia West is a 42 y.o. female who presents to [facility] today for several issues:  1.  URI:  With cough.  Present for past week or so.  Walking for exercise, states she was outside when weather changed and was chilly.  Cough worse at night, has some pleuritic pain with her cough, left side of her chest.  No pain with exertion.  Generally productive cough with green sputum production.  Has been taking Nyquil, Dayquil, Theraflu all without relief.   2.  Diabetes:  On Lantus once daily 60 units plus Glipizide and Metformin.  No adverse effects from medication.  No hypoglycemic events.  No paresthesia or peripheral nerve pain.  Measures blood sugars at home every:  Several days. Has been eating more sugary snacks recently.     Lab Results  Component Value Date   HGBA1C 11.6 07/25/2012   3.  Chronic allergic rhinitis:  Takes Zyrtec on daily basis.  Has not been taking this recently.  Has been taking Nasonex on sporadic basis.  No ocular invovlement.  Feels this has worsened since her URI.   Prev health:  Currently overdue for flu shot.  On an ACE, no need for microalbumin.   The following portions of the patient's history were reviewed and updated as appropriate: allergies, current medications, past medical history, family and social history, and problem list. Patient is a nonsmoker.    PMH reviewed.  Past Medical History  Diagnosis Date  . Diabetes mellitus   . Hypertension   . Herpes simplex labialis   . TB (tuberculosis) contact     + skin test,/- chest x-ray   No past surgical history on file.  Medications reviewed. Current Outpatient Prescriptions  Medication Sig Dispense Refill  . amLODipine (NORVASC) 5 MG tablet Take 1 tablet (5 mg total) by mouth daily.  90 tablet  1  . atorvastatin (LIPITOR) 40 MG tablet Take 1 tablet (40 mg total) by mouth daily.  90 tablet  3  . Blood Glucose Monitoring Suppl ([place]) W/DEVICE KIT by Does not apply route.         . Cetirizine HCl 10 MG TBDP Take 1 tablet by mouth daily as needed. Sneezing and coughing      . clonazePAM (KLONOPIN) 0.5 MG tablet Take 1 tablet (0.5 mg total) by mouth 2 (two) times daily as needed for anxiety.  30 tablet  3  . fluticasone (FLONASE) 50 MCG/ACT nasal spray Place 2 sprays into the nose 2 (two) times daily as needed. For allergies      . glipiZIDE (GLUCOTROL) 10 MG tablet Take 5-10 mg by mouth 2 (two) times daily. Take 1 tab in the morning and 1/2 tablet by mouth in the evening      . glucose blood (ONE TOUCH ULTRA TEST) test strip 1 each by Other route daily. Use as instructed       . insulin glargine (LANTUS) 100 UNIT/ML injection Inject 60 Units into the skin daily. Disp: 2 box  45 mL  3  . lisinopril-hydrochlorothiazide (PRINZIDE,ZESTORETIC) 20-25 MG per tablet Take 1 tablet by mouth daily.  90 tablet  3  . meloxicam (MOBIC) 7.5 MG tablet Take 1 tablet (7.5 mg total) by mouth daily.  30 tablet  1  . metFORMIN (GLUCOPHAGE) 500 MG tablet Take 1 tablet (500 mg total) by mouth 2 (two) times daily with a meal.  60 tablet  3  . mometasone (NASONEX) 50 MCG/ACT nasal  spray Place 2 sprays into the nose daily.  17 g  11  . sulfamethoxazole-trimethoprim (BACTRIM DS,SEPTRA DS) 800-160 MG per tablet Take 1 tablet by mouth 2 (two) times daily.  14 tablet  0  . traMADol (ULTRAM) 50 MG tablet Take 1 tablet (50 mg total) by mouth every 8 (eight) hours as needed for pain.  45 tablet  0   Current Facility-Administered Medications  Medication Dose Route Frequency Provider Last Rate Last Dose  . TDaP (BOOSTRIX) injection 0.5 mL  0.5 mL Intramuscular Once Alveda Reasons, MD        ROS as above otherwise neg.  No palpitations, nausea, diaphoresis, vomiting.   Objective:   Physical Exam BP 153/94  Pulse 121  Temp(Src) 98.2 F (36.8 C) (Oral)  Ht 5' 9"  (1.753 m)  Wt 271 lb 8 oz (123.152 kg)  BMI 40.08 kg/m2  SpO2 100% Gen:  Alert, cooperative patient who appears stated age in no acute  distress.  Vital signs reviewed. HEENT: EOMI,  MMM Neck:  Supple Cardiac:  Regular rate and rhythm  Pulm:  Poor inspiratory effort depsite encouragement.  Decreased lung sides BL bases.  Coughing with productive sputum whenever she takes a deep breath.  Some pleuritic pain and chest wall tenderness with coughing in clinic.  Abd:  Soft/nondistended/nontender.    Exts: No edema noted.  Superficial wound is healing, no drainage, almost gone.    No results found for this or any previous visit (from the past 72 hour(s)).

## 2012-12-09 NOTE — Patient Instructions (Signed)
Take the Levaquin for 7 days.  Try the Hycodan syrup.  I have sent in the blood pressure medicine as well.  Come back in 10 days.

## 2012-12-10 DIAGNOSIS — J069 Acute upper respiratory infection, unspecified: Secondary | ICD-10-CM | POA: Insufficient documentation

## 2012-12-10 NOTE — Assessment & Plan Note (Signed)
Healing now. Continued discussion on CBG control.

## 2012-12-10 NOTE — Assessment & Plan Note (Signed)
At goal.  On statin, no myalgias/concerns.

## 2012-12-10 NOTE — Assessment & Plan Note (Signed)
Combined with LRTI. EKG today for chest pain -- very atypical, pleuritic in nature, not concerned for cardiac pain.  More to establish baseline EKG for future reference.  No ST changes, ischemia. Does show prolonged QT.  Did not want to risk macrolide, therefore treating with Levaquin.  Would have preferred Amox but she has unknown allergy to PCN.   FU for improvement in 10 - 14 days or sooner if no improvement/worsening.

## 2012-12-10 NOTE — Assessment & Plan Note (Signed)
A1C improved but not at goal. Refer to health coach today.  Discussed lifestyle modifications, encouraged to continue to walk after she is feeling better.

## 2012-12-10 NOTE — Assessment & Plan Note (Signed)
Not at goal. Has been taking OTC anti-congestants. To stop and return in 2 weeks for BP follow-up.

## 2012-12-10 NOTE — Assessment & Plan Note (Signed)
Continue Zyrtec and Nasonex.  To use Nasonex more frequently

## 2012-12-11 ENCOUNTER — Other Ambulatory Visit: Payer: Self-pay

## 2012-12-23 ENCOUNTER — Encounter: Payer: Self-pay | Admitting: Family Medicine

## 2012-12-23 ENCOUNTER — Ambulatory Visit (INDEPENDENT_AMBULATORY_CARE_PROVIDER_SITE_OTHER): Payer: No Typology Code available for payment source | Admitting: Family Medicine

## 2012-12-23 VITALS — BP 131/83 | HR 102 | Temp 99.0°F | Ht 69.0 in | Wt 261.0 lb

## 2012-12-23 DIAGNOSIS — M5380 Other specified dorsopathies, site unspecified: Secondary | ICD-10-CM

## 2012-12-23 DIAGNOSIS — J069 Acute upper respiratory infection, unspecified: Secondary | ICD-10-CM

## 2012-12-23 DIAGNOSIS — R059 Cough, unspecified: Secondary | ICD-10-CM

## 2012-12-23 DIAGNOSIS — M62830 Muscle spasm of back: Secondary | ICD-10-CM | POA: Insufficient documentation

## 2012-12-23 DIAGNOSIS — R05 Cough: Secondary | ICD-10-CM

## 2012-12-23 MED ORDER — HYDROCOD POLST-CHLORPHEN POLST 10-8 MG/5ML PO LQCR: 5.0000 mL | Freq: Two times a day (BID) | ORAL | Status: DC | PRN

## 2012-12-23 NOTE — Progress Notes (Signed)
Patient ID: Alicia West, female   DOB: 11/07/1970, 42 y.o.   MRN: [id number] FAMILY MEDICINE OFFICE NOTE  Chief Complaint:  Pain from coughing  Primary Care Physician: Annabell Sabal, MD  HPI:  Alicia West here for rib and back pain from coughing.   Was seen 11/4 for cough and URI symptoms Present for 2-3 weeks or so.   Cough worse at night Pleuritic pain with her cough, left side of her chest and back. Pain is constant but then has some burning on that left side with cough.  No pain with exertion.  No longer productive cough. More reactive  Has been taking Nyquil, Dayquil, Theraflu all without relief.  Was given hycodan syrup but while it helps her cough, it makes her dizzy and emotional so she won't take it.  Completed levaquin  Says she has had tussionex before and that works better for her. Kept a bottle from 2011 because it works well.   States this happens to her every year where she will get a cold and get better but the cough lingers. The burning and chest pain are also normal for her although normally the burning is lower down on her rib cage than this time. States the only thing to make them better is to get the cough calmed down for awhile.   No fevers, chills, nausea, vomiting, diarrhea, SOB, DOE, or other symptoms.    PMHx:  Past Medical History  Diagnosis Date  . Diabetes mellitus   . Hypertension   . Herpes simplex labialis   . TB (tuberculosis) contact     + skin test,/- chest x-ray    No past surgical history on file.  FAMHx:  No family history on file.  SOCHx:   reports that she has never smoked. She has never used smokeless tobacco. She reports that she does not drink alcohol or use illicit drugs.  ALLERGIES:  Allergies  Allergen Reactions  . Penicillins Other (See Comments)    Unknown childhood reaction    ROS: Pertinent ROS as seen in HPI. Otherwise negative.   HOME MEDS: Current Outpatient Prescriptions  Medication Sig Dispense  Refill  . amLODipine (NORVASC) 5 MG tablet Take 1 tablet (5 mg total) by mouth daily.  90 tablet  1  . atorvastatin (LIPITOR) 40 MG tablet Take 1 tablet (40 mg total) by mouth daily.  90 tablet  3  . Blood Glucose Monitoring Suppl ([place]) W/DEVICE KIT by Does not apply route.        . Cetirizine HCl 10 MG TBDP Take 1 tablet by mouth daily as needed. Sneezing and coughing      . chlorpheniramine-HYDROcodone (TUSSIONEX PENNKINETIC ER) 10-8 MG/5ML LQCR Take 5 mLs by mouth every 12 (twelve) hours as needed for cough.  115 mL  0  . clonazePAM (KLONOPIN) 0.5 MG tablet Take 1 tablet (0.5 mg total) by mouth 2 (two) times daily as needed for anxiety.  30 tablet  3  . fluticasone (FLONASE) 50 MCG/ACT nasal spray Place 2 sprays into the [place] 2 (two) times daily as needed. For allergies      . glipiZIDE (GLUCOTROL) 10 MG tablet Take 5-10 mg by mouth 2 (two) times daily. Take 1 tab in the morning and 1/2 tablet by mouth in the evening      . glucose blood (ONE TOUCH ULTRA TEST) test strip 1 each by Other route daily. Use as instructed       . insulin glargine (LANTUS)  100 UNIT/ML injection Inject 60 Units into the skin daily. Disp: 2 box  45 mL  3  . lisinopril-hydrochlorothiazide (PRINZIDE,ZESTORETIC) 20-25 MG per tablet Take 1 tablet by mouth daily.  90 tablet  3  . meloxicam (MOBIC) 7.5 MG tablet Take 1 tablet (7.5 mg total) by mouth daily.  30 tablet  1  . metFORMIN (GLUCOPHAGE) 500 MG tablet Take 1 tablet (500 mg total) by mouth 2 (two) times daily with a meal.  60 tablet  3  . mometasone (NASONEX) 50 MCG/ACT nasal spray Place 2 sprays into the [place] daily.  17 g  11  . traMADol (ULTRAM) 50 MG tablet Take 1 tablet (50 mg total) by mouth every 8 (eight) hours as needed for pain.  45 tablet  0   Current Facility-Administered Medications  Medication Dose Route Frequency Provider Last Rate Last Dose  . TDaP (BOOSTRIX) injection 0.5 mL  0.5 mL Intramuscular Once Alveda Reasons, MD         LABS/IMAGING: No results found for this or any previous visit (from the past 48 hour(s)). No results found.  VITALS: BP 131/83  Pulse 102  Temp(Src) 99 F (37.2 C) (Oral)  Ht 5' 9"  (1.753 m)  Wt 261 lb (118.389 kg)  BMI 38.53 kg/m2  EXAM: Gen: NAD, well appearing HEENT: oropharynx clear, MMM, cobblestoning of posterior pharynx, pupils equal and reactive PULM: LCTAB, no wheezes/rhonchi/rales CV: RRR, no murmurs ABD: soft, NT, ND BACK: palpable muscle spasm in the trapezius on the left side that corresponds to her back pain.  SKIN: clear without rashes or erythema    ASSESSMENT: Cough  URI (upper respiratory infection)  Muscle spasm of back  PLAN: See assessment and plan section.

## 2012-12-23 NOTE — Assessment & Plan Note (Signed)
Palpable on exam Suspect related to her persistent coughing Recommend cough treatment as above Advised to let us know if it worsens or changes at all.

## 2012-12-23 NOTE — Assessment & Plan Note (Addendum)
URI symptoms resolved but now with more reactive cough No indication on hx or story to suggest need for cxr at this time particularly in light of hx of symptoms identical to what usually happens to her post uri.  - ekg done at last visit for similar story and negative. No indication this is GERD related at this time. rx of tussionex syrup for cough - no indication for more abx F/u in 10 days if no improvement and at that time will likely plan for imaging

## 2012-12-23 NOTE — Patient Instructions (Signed)
1) for the muscle spasm- try lots of heat 2) for the cough- I gave you the tussionex syrup 3) let me know if things don't improve with that

## 2013-02-18 ENCOUNTER — Ambulatory Visit: Payer: No Typology Code available for payment source

## 2013-02-19 ENCOUNTER — Ambulatory Visit: Payer: No Typology Code available for payment source

## 2013-03-04 ENCOUNTER — Telehealth: Payer: Self-pay | Admitting: Family Medicine

## 2013-03-04 DIAGNOSIS — I10 Essential (primary) hypertension: Secondary | ICD-10-CM

## 2013-03-04 DIAGNOSIS — E1165 Type 2 diabetes mellitus with hyperglycemia: Secondary | ICD-10-CM

## 2013-03-04 DIAGNOSIS — IMO0002 Reserved for concepts with insufficient information to code with codable children: Secondary | ICD-10-CM

## 2013-03-04 NOTE — Telephone Encounter (Signed)
Pt called and needs help switching her medications to a different pharmacy. She was using the [facility] department and now has insurance and needs help on deciding where to go. jw

## 2013-03-05 ENCOUNTER — Other Ambulatory Visit: Payer: Self-pay | Admitting: Family Medicine

## 2013-03-05 MED ORDER — METFORMIN HCL 500 MG PO TABS: 500.0000 mg | ORAL_TABLET | Freq: Two times a day (BID) | ORAL | Status: DC

## 2013-03-05 MED ORDER — GLIPIZIDE 10 MG PO TABS: 5.0000 mg | ORAL_TABLET | Freq: Two times a day (BID) | ORAL | Status: DC

## 2013-03-05 MED ORDER — ATORVASTATIN CALCIUM 40 MG PO TABS: 40.0000 mg | ORAL_TABLET | Freq: Every day | ORAL | Status: DC

## 2013-03-05 MED ORDER — AMLODIPINE BESYLATE 5 MG PO TABS: 5.0000 mg | ORAL_TABLET | Freq: Every day | ORAL | Status: DC

## 2013-03-05 NOTE — Telephone Encounter (Signed)
All meds were refilled per Dr Mingo Amber Verbal orders metformin and glipizide to [facility] and Norvasc,amlodipine and Lipitor  to [facility].patient was informed. [place], Lewie Loron

## 2013-03-17 ENCOUNTER — Telehealth: Payer: Self-pay | Admitting: Family Medicine

## 2013-03-17 ENCOUNTER — Ambulatory Visit (INDEPENDENT_AMBULATORY_CARE_PROVIDER_SITE_OTHER): Payer: No Typology Code available for payment source | Admitting: Family Medicine

## 2013-03-17 ENCOUNTER — Encounter: Payer: Self-pay | Admitting: Family Medicine

## 2013-03-17 VITALS — BP 127/90 | HR 108 | Temp 98.1°F | Ht 69.0 in | Wt 261.9 lb

## 2013-03-17 DIAGNOSIS — IMO0001 Reserved for inherently not codable concepts without codable children: Secondary | ICD-10-CM

## 2013-03-17 DIAGNOSIS — F50811 Binge eating disorder, moderate: Secondary | ICD-10-CM

## 2013-03-17 DIAGNOSIS — IMO0002 Reserved for concepts with insufficient information to code with codable children: Secondary | ICD-10-CM

## 2013-03-17 DIAGNOSIS — J309 Allergic rhinitis, unspecified: Secondary | ICD-10-CM

## 2013-03-17 DIAGNOSIS — E119 Type 2 diabetes mellitus without complications: Secondary | ICD-10-CM

## 2013-03-17 DIAGNOSIS — Z23 Encounter for immunization: Secondary | ICD-10-CM

## 2013-03-17 DIAGNOSIS — F5081 Binge eating disorder: Secondary | ICD-10-CM

## 2013-03-17 DIAGNOSIS — F5089 Other specified eating disorder: Secondary | ICD-10-CM

## 2013-03-17 DIAGNOSIS — I10 Essential (primary) hypertension: Secondary | ICD-10-CM

## 2013-03-17 DIAGNOSIS — E1165 Type 2 diabetes mellitus with hyperglycemia: Secondary | ICD-10-CM

## 2013-03-17 HISTORY — DX: Binge eating disorder, moderate: F50.811

## 2013-03-17 HISTORY — DX: Binge eating disorder: F50.81

## 2013-03-17 LAB — CBC
HCT: 35.9 % — ABNORMAL LOW (ref 36.0–46.0)
Hemoglobin: 11.9 g/dL — ABNORMAL LOW (ref 12.0–15.0)
MCH: 25.8 pg — ABNORMAL LOW (ref 26.0–34.0)
MCHC: 33.1 g/dL (ref 30.0–36.0)
MCV: 77.9 fL — ABNORMAL LOW (ref 78.0–100.0)
PLATELETS: 519 10*3/uL — AB (ref 150–400)
RBC: 4.61 MIL/uL (ref 3.87–5.11)
RDW: 16.3 % — AB (ref 11.5–15.5)
WBC: 9.3 10*3/uL (ref 4.0–10.5)

## 2013-03-17 LAB — COMPREHENSIVE METABOLIC PANEL
ALK PHOS: 118 U/L — AB (ref 39–117)
ALT: 13 U/L (ref 0–35)
AST: 10 U/L (ref 0–37)
Albumin: 4.1 g/dL (ref 3.5–5.2)
BILIRUBIN TOTAL: 0.3 mg/dL (ref 0.2–1.2)
BUN: 11 mg/dL (ref 6–23)
CHLORIDE: 98 meq/L (ref 96–112)
CO2: 31 mEq/L (ref 19–32)
CREATININE: 0.8 mg/dL (ref 0.50–1.10)
Calcium: 10.1 mg/dL (ref 8.4–10.5)
Glucose, Bld: 275 mg/dL — ABNORMAL HIGH (ref 70–99)
Potassium: 4 mEq/L (ref 3.5–5.3)
Sodium: 136 mEq/L (ref 135–145)
Total Protein: 7.8 g/dL (ref 6.0–8.3)

## 2013-03-17 LAB — POCT GLYCOSYLATED HEMOGLOBIN (HGB A1C): HEMOGLOBIN A1C: 8.5

## 2013-03-17 LAB — TSH: TSH: 3.313 u[IU]/mL (ref 0.350–4.500)

## 2013-03-17 LAB — LIPID PANEL
CHOL/HDL RATIO: 3.6 ratio
Cholesterol: 144 mg/dL (ref 0–200)
HDL: 40 mg/dL (ref >39)
LDL CALC: 76 mg/dL (ref 0–99)
Triglycerides: 139 mg/dL (ref <150)
VLDL: 28 mg/dL (ref 0–40)

## 2013-03-17 MED ORDER — METFORMIN HCL 1000 MG PO TABS: 1000.0000 mg | ORAL_TABLET | Freq: Two times a day (BID) | ORAL | Status: DC

## 2013-03-17 MED ORDER — LORCASERIN HCL 10 MG PO TABS: 10.0000 mg | ORAL_TABLET | Freq: Two times a day (BID) | ORAL | Status: DC

## 2013-03-17 NOTE — Progress Notes (Signed)
Subjective:    Alicia West is a 43 y.o. female who presents to [facility] today for several concerns:  1.  Obesity:  Would like pill to help with this.  Now having trouble with binge eating.  States she awakes in middle of the night and snacks on food.  Also in evening.  Triggered by racing thoughts and anxiety.  Aftewards feels guilty but binge eating relieves her anxiety symptoms.  No purging.    2.  Chronic allergic rhinitis:  Taking FLonase and Cetirizine.  Has history of Afrin overuse many years ago with many concurrent sinus infections.  Was previously using it every single night multiple times a night.  Now, her with constant post-nasal drip, bad taste in mouth, and chronic nasal congestion.   3. Diabetes:  Currently on 60 Lantus, Metformin BID, and Glipizide.  No adverse effects from medication.  No paresthesia or peripheral nerve pain.  Measures blood sugars at home every: AM and sometimes during the day.  This AM her CBG was 347.  She's also had some 70s and 80s which are also in the AM.  Has stopped eating breakfast, CBGs dropping at work.  Much hungrier when CBGs drop. Lab Results  Component Value Date   HGBA1C 9.2 12/09/2012    Prev health:   Currently overdue for flu shot  ROS as above  The following portions of the patient's history were reviewed and updated as appropriate: allergies, current medications, past medical history, family and social history, and problem list. Patient is a nonsmoker.    PMH reviewed.  Past Medical History  Diagnosis Date  . Diabetes mellitus   . Hypertension   . Herpes simplex labialis   . TB (tuberculosis) contact     + skin test,/- chest x-ray   No past surgical history on file.  Medications reviewed. Current Outpatient Prescriptions  Medication Sig Dispense Refill  . amLODipine (NORVASC) 5 MG tablet Take 1 tablet (5 mg total) by mouth daily.  90 tablet  1  . atorvastatin (LIPITOR) 40 MG tablet Take 1 tablet (40 mg total) by mouth daily.   90 tablet  3  . Blood Glucose Monitoring Suppl ([place]) W/DEVICE KIT by Does not apply route.        . Cetirizine HCl 10 MG TBDP Take 1 tablet by mouth daily as needed. Sneezing and coughing      . chlorpheniramine-HYDROcodone (TUSSIONEX PENNKINETIC ER) 10-8 MG/5ML LQCR Take 5 mLs by mouth every 12 (twelve) hours as needed for cough.  115 mL  0  . clonazePAM (KLONOPIN) 0.5 MG tablet Take 1 tablet (0.5 mg total) by mouth 2 (two) times daily as needed for anxiety.  30 tablet  3  . fluticasone (FLONASE) 50 MCG/ACT nasal spray Place 2 sprays into the nose 2 (two) times daily as needed. For allergies      . glipiZIDE (GLUCOTROL) 10 MG tablet Take 0.5-1 tablets (5-10 mg total) by mouth 2 (two) times daily. Take 1 tab in the morning and 1/2 tablet by mouth in the evening  45 tablet  5  . glucose blood (ONE TOUCH ULTRA TEST) test strip 1 each by Other route daily. Use as instructed       . insulin glargine (LANTUS) 100 UNIT/ML injection Inject 60 Units into the skin daily. Disp: 2 box  45 mL  3  . lisinopril-hydrochlorothiazide (PRINZIDE,ZESTORETIC) 20-25 MG per tablet TAKE ONE TABLET BY MOUTH EVERY DAY  90 tablet  3  .  meloxicam (MOBIC) 7.5 MG tablet Take 1 tablet (7.5 mg total) by mouth daily.  30 tablet  1  . metFORMIN (GLUCOPHAGE) 500 MG tablet Take 1 tablet (500 mg total) by mouth 2 (two) times daily with a meal.  60 tablet  3  . mometasone (NASONEX) 50 MCG/ACT nasal spray Place 2 sprays into the nose daily.  17 g  11  . traMADol (ULTRAM) 50 MG tablet Take 1 tablet (50 mg total) by mouth every 8 (eight) hours as needed for pain.  45 tablet  0   Current Facility-Administered Medications  Medication Dose Route Frequency Provider Last Rate Last Dose  . TDaP (BOOSTRIX) injection 0.5 mL  0.5 mL Intramuscular Once Alveda Reasons, MD         Objective:   Physical Exam BP 127/90  Pulse 108  Temp(Src) 98.1 F (36.7 C) (Oral)  Ht 5' 9"  (1.753 m)  Wt 261 lb 14.4 oz (118.797 kg)  BMI  38.66 kg/m2 Gen:  Alert, cooperative patient who appears stated age in no acute distress.  Vital signs reviewed. HEENT: EOMI,  MMM.  Nasal turbinates grossly enlarged BL and edematous/erythematous.  No exudates.  Negative sinus transillumination test BL, Right worse than Left.   Cardiac:  Regular rate and rhythm  Pulm:  Clear throughout ABd:  Obese/NT Exts: Non edematous BL  LE, warm and well perfused.   No results found for this or any previous visit (from the past 72 hour(s)).

## 2013-03-17 NOTE — Assessment & Plan Note (Signed)
Chronic. Had trouble with overuse of Afrin. On Flonase and Ceterizine currently. Maxed out on meds for me -- long-standing problem.  Will refer to ENT today

## 2013-03-17 NOTE — Assessment & Plan Note (Addendum)
Stop Glipizide to prevent hypoglycemia. Sounds like she hasn't been taking the Metformin for several months.  Hasn't been refilled. Increased to 1000 mg BID -- will see how she tolerates this. Continue with Lantus at current dosing to prevent hyperglycemia.   Lipids and labs today Ulimately won't change much until we address her underlying eating disorder.

## 2013-03-17 NOTE — Patient Instructions (Addendum)
You talked about having breakfast earlier in the day.  I think this is a good idea.    Stop the Glipizide. Double the dose of Metformin and there will be a new prescription. Don't change the Lantus.  We'll try the Belviq. I will refer you to a therapist.   Referral for ENT.

## 2013-03-17 NOTE — Telephone Encounter (Signed)
Alicia West optho. Patient has spoken with her insurance and they told her that they will cover the eye doctor appointment if medically necessary. Which it is for her Diabetes. Patient then contacted  optho about this and she did say that they do need a referral due to the fact patient is diabetic and needs a procedure done.

## 2013-03-17 NOTE — Assessment & Plan Note (Addendum)
New problem for me but not for her.  Attempting Belviq to help with weight gain -- but we had a long discussion that this will not stop her compulsive behaviors.   She is willing to try therapist.   Referral today

## 2013-03-17 NOTE — Telephone Encounter (Signed)
Pt called to tell Clarise Cruz information about the eye doctor. Please call back. jw

## 2013-03-17 NOTE — Assessment & Plan Note (Signed)
At goal.  

## 2013-03-18 NOTE — Telephone Encounter (Signed)
Pt has an appt on 04/20/2013@2 :45pm [facility]. Pt is aware.  [place]

## 2013-04-28 ENCOUNTER — Ambulatory Visit (INDEPENDENT_AMBULATORY_CARE_PROVIDER_SITE_OTHER): Payer: No Typology Code available for payment source | Admitting: Family Medicine

## 2013-04-28 ENCOUNTER — Encounter: Payer: Self-pay | Admitting: Family Medicine

## 2013-04-28 VITALS — BP 140/99 | HR 118 | Temp 98.1°F | Ht 69.0 in | Wt 257.0 lb

## 2013-04-28 DIAGNOSIS — H3530 Unspecified macular degeneration: Secondary | ICD-10-CM

## 2013-04-28 DIAGNOSIS — E669 Obesity, unspecified: Secondary | ICD-10-CM

## 2013-04-28 DIAGNOSIS — IMO0001 Reserved for inherently not codable concepts without codable children: Secondary | ICD-10-CM

## 2013-04-28 DIAGNOSIS — IMO0002 Reserved for concepts with insufficient information to code with codable children: Secondary | ICD-10-CM

## 2013-04-28 DIAGNOSIS — E1165 Type 2 diabetes mellitus with hyperglycemia: Secondary | ICD-10-CM

## 2013-04-28 MED ORDER — GLUCOSE BLOOD VI STRP
ORAL_STRIP | Status: DC
Start: 2013-04-28 — End: 2013-05-26

## 2013-04-28 MED ORDER — INSULIN PEN NEEDLE 31G X 8 MM MISC: Status: DC

## 2013-04-28 NOTE — Patient Instructions (Addendum)
Try to get something in your stomach in the morning.  Eating regular meals keeps you fuller long and you end up not eating more.   I will refer you to counseling and nutritionist.  You should get a call about this.   Schedule a pap smear for next visit.

## 2013-04-28 NOTE — Progress Notes (Signed)
Subjective:    Alicia West is a 43 y.o. female who presents to [facility] today for several issues:  Nutrition and counseling referrals.  1. Diabetes:  Currently on  Metformin and Lantus 60 units daily.   No adverse effects from medication.  No hypoglycemic events.  No paresthesia or peripheral nerve pain.  Measures blood sugars at home every:     Lab Results  Component Value Date   HGBA1C 8.5 03/17/2013   Needs prescrioption for test strips.    Spoke with pharmacist re: strategies to lower CBGs.  Patient not eating breakfast and only eating 2x daily.    2.  Macular degeneration:  Being seen by Ophtho for this at [facility].  She is currently receiving intravitreal avastin for treatment.  Started earlier this month.  Has not yet noticed much of a difference with this.  Still with blurry central vision -- but unsure if this is worse since her sugars increased.   ROS as above per HPI, otherwise neg.    The following portions of the patient's history were reviewed and updated as appropriate: allergies, current medications, past medical history, family and social history, and problem list. Patient is a nonsmoker.    PMH reviewed.  Past Medical History  Diagnosis Date  . Diabetes mellitus   . Hypertension   . Herpes simplex labialis   . TB (tuberculosis) contact     + skin test,/- chest x-ray   No past surgical history on file.  Medications reviewed. Current Outpatient Prescriptions  Medication Sig Dispense Refill  . amLODipine (NORVASC) 5 MG tablet Take 1 tablet (5 mg total) by mouth daily.  90 tablet  1  . atorvastatin (LIPITOR) 40 MG tablet Take 1 tablet (40 mg total) by mouth daily.  90 tablet  3  . Blood Glucose Monitoring Suppl ([place]) W/DEVICE KIT by Does not apply route.        . Cetirizine HCl 10 MG TBDP Take 1 tablet by mouth daily as needed. Sneezing and coughing      . fluticasone (FLONASE) 50 MCG/ACT nasal spray Place 2 sprays into the nose 2 (two)  times daily as needed. For allergies      . glipiZIDE (GLUCOTROL) 10 MG tablet Take 0.5-1 tablets (5-10 mg total) by mouth 2 (two) times daily. Take 1 tab in the morning and 1/2 tablet by mouth in the evening  45 tablet  5  . glucose blood (ONE TOUCH ULTRA TEST) test strip 1 each by Other route daily. Use as instructed       . insulin glargine (LANTUS) 100 UNIT/ML injection Inject 60 Units into the skin daily. Disp: 2 box  45 mL  3  . lisinopril-hydrochlorothiazide (PRINZIDE,ZESTORETIC) 20-25 MG per tablet TAKE ONE TABLET BY MOUTH EVERY DAY  90 tablet  3  . Lorcaserin HCl 10 MG TABS Take 10 mg by mouth 2 (two) times daily.  60 tablet  1  . metFORMIN (GLUCOPHAGE) 1000 MG tablet Take 1 tablet (1,000 mg total) by mouth 2 (two) times daily with a meal.  60 tablet  3   Current Facility-Administered Medications  Medication Dose Route Frequency Provider Last Rate Last Dose  . TDaP (BOOSTRIX) injection 0.5 mL  0.5 mL Intramuscular Once Alveda Reasons, MD         Objective:   Physical Exam BP 140/99  Pulse 118  Temp(Src) 98.1 F (36.7 C) (Oral)  Ht 5' 9" (1.753 m)  Wt 257 lb (  116.574 kg)  BMI 37.93 kg/m2 Gen:  Alert, cooperative patient who appears stated age in no acute distress.  Vital signs reviewed. HEENT: EOMI,  MMM Cardiac:  Regular rate and rhythm  Pulm:  Clear to auscultation bilaterally    No results found for this or any previous visit (from the past 72 hour(s)).

## 2013-04-29 ENCOUNTER — Telehealth: Payer: Self-pay | Admitting: Family Medicine

## 2013-04-29 DIAGNOSIS — E1165 Type 2 diabetes mellitus with hyperglycemia: Principal | ICD-10-CM

## 2013-04-29 DIAGNOSIS — IMO0001 Reserved for inherently not codable concepts without codable children: Secondary | ICD-10-CM

## 2013-04-29 NOTE — Telephone Encounter (Signed)
Pt called because her insurance company is not letting her get medication until the prior authorization is done which could take 6-7 business days. She wanted to know what she is suppose to do and if we had any samples that she can have,jw

## 2013-04-30 NOTE — Telephone Encounter (Signed)
[facility] says they dont have her type of test strips-they are on back order. Pt would like a new meter or test strips for another meter she has-One Touch ULtra mini Please advise

## 2013-05-01 DIAGNOSIS — E669 Obesity, unspecified: Principal | ICD-10-CM

## 2013-05-01 HISTORY — DX: Morbid (severe) obesity due to excess calories: E66.01

## 2013-05-01 MED ORDER — GLUCOSE BLOOD VI STRP: ORAL_STRIP | Status: DC

## 2013-05-01 MED ORDER — INSULIN GLARGINE 100 UNIT/ML ~~LOC~~ SOLN: 60.0000 [IU] | Freq: Every day | SUBCUTANEOUS | Status: DC

## 2013-05-01 NOTE — Assessment & Plan Note (Signed)
Counseled on dietary changes and exercise. Discussion on binge eating. Patient desires nutrition consult as well as counseling.

## 2013-05-01 NOTE — Assessment & Plan Note (Signed)
Doing well currently. Recommended to continue to FU with optho

## 2013-05-01 NOTE — Telephone Encounter (Signed)
Pt would like a return call or update on what she is suppose to do about her medication. jw

## 2013-05-01 NOTE — Telephone Encounter (Signed)
Returned call to patient.  Has been out of Lantus insulin x 1 week.  Patient had Rx transferred from [facility] to [facility] since she now has insurance.  Insurance company [employer]) is requiring a prior authorization.  Will contact insurance company for PA form.  Patient requesting Lantus sample until insulin is approved since she is unable to afford to pay out of pocket.   Will check on samples and call patient back.  Burna Forts, BSN, RN-BC   No Lantus Solastar pens available, only have vials.  Called patient back and left message to call our office back.  [employer] and they will fax prior auth form to our office.  Will place form in MD's box for completion once we receive it via fax.  Burna Forts, BSN, RN-BC

## 2013-05-01 NOTE — Assessment & Plan Note (Signed)
Prescribed test strips today. This was some questions about Lantus vs Levemir based on her insurance.  Will continue current meds.

## 2013-05-01 NOTE — Telephone Encounter (Signed)
I have sent in the test strips.  However the earlier notes mentioned something about her medications?  I don't see which medications they mean, or if they simply meant only the test strips.

## 2013-05-01 NOTE — Telephone Encounter (Signed)
[name] is preceptor this afternoon. Will route message with glucometer change request and call patient back.  Burna Forts, BSN, RN-BC

## 2013-05-04 ENCOUNTER — Other Ambulatory Visit: Payer: Self-pay | Admitting: Family Medicine

## 2013-05-04 DIAGNOSIS — R632 Polyphagia: Secondary | ICD-10-CM

## 2013-05-04 DIAGNOSIS — E669 Obesity, unspecified: Secondary | ICD-10-CM

## 2013-05-05 NOTE — Telephone Encounter (Signed)
Left message  For a return call.Dominga Mcduffie, Lewie Loron

## 2013-05-05 NOTE — Telephone Encounter (Addendum)
On a related note -- [employer], would you mind calling Ms Redmond Pulling?  We do not do referrals for psychology counseling, but we can mail her a list of places to call or she can pick up one at her next visit.  I don't want her waiting for this and thinking we forgot.   Also, please let her know that the nutrition referral has been placed.  However, she also needs to call Dr. Jenne Campus herself to schedule an appt.  That number is [phone]. Thanks, Merry Proud

## 2013-05-06 NOTE — Telephone Encounter (Signed)
Relayed message,patient voiced understanding.Copy of MAP(Medication Assistance Program Information sheet and copy of Psychologist that accept medicaid was mailed to patient.She states she'll call Dr Jenne Campus.Alicia West, Alicia West

## 2013-05-18 ENCOUNTER — Telehealth: Payer: Self-pay | Admitting: Family Medicine

## 2013-05-18 DIAGNOSIS — E1165 Type 2 diabetes mellitus with hyperglycemia: Principal | ICD-10-CM

## 2013-05-18 DIAGNOSIS — IMO0001 Reserved for inherently not codable concepts without codable children: Secondary | ICD-10-CM

## 2013-05-18 NOTE — Telephone Encounter (Signed)
Spoke to [employer].She states Lantus no longer covered by her insurance would need prior-auth.They prefer Levimir.Please advise.[place]

## 2013-05-18 NOTE — Telephone Encounter (Signed)
Pt called because she went to pick up here insulin and was told that it has not been approved yet. Pt stated that a nurse called her and told her it was approved and ready for pick up. Please call her and let her know what is going on. jw

## 2013-05-19 NOTE — Telephone Encounter (Signed)
Alicia West is still waiting confirmation that the refill on her insulin has gone through.  Is not feeling well because she hasn't had her insulin.  Please contact her to find out what is the holdup.  Would like to have the nurse supervisor contact her back asap at her number [phone]

## 2013-05-20 MED ORDER — INSULIN DETEMIR 100 UNIT/ML ~~LOC~~ SOLN: 60.0000 [IU] | Freq: Every day | SUBCUTANEOUS | Status: DC

## 2013-05-20 NOTE — Telephone Encounter (Addendum)
Switching to Levemir.  Same dosage as Lantus 60 units daily.  Prescription has already been sent.  Forwarding to Burna Forts at patient request.

## 2013-05-21 NOTE — Telephone Encounter (Signed)
Returned call to patient and left message with female family member to call our office back.  Nolene Ebbs, RN

## 2013-05-21 NOTE — Telephone Encounter (Signed)
Patient called back.  Patient contacted her insurance co [facility]) and Lantus was approved at a cheaper price than the Levemir.  Patient went to Lantus website and" printed coupon that covers med for 2-3 years ($25 co-pay)."   Wants to change back to Lantus.  Will route request to [name] and call patient back when completed or if any questions/concerns.  Burna Forts, BSN, RN-BC

## 2013-05-22 MED ORDER — INSULIN GLARGINE 100 UNIT/ML ~~LOC~~ SOLN: 60.0000 [IU] | Freq: Every day | SUBCUTANEOUS | Status: DC

## 2013-05-22 NOTE — Telephone Encounter (Signed)
Prescription sent

## 2013-05-22 NOTE — Telephone Encounter (Signed)
Returned call to patient and left detailed message that Lantus Rx was sent to her pharmacy.  Burna Forts, BSN, RN-BC

## 2013-05-26 ENCOUNTER — Ambulatory Visit (INDEPENDENT_AMBULATORY_CARE_PROVIDER_SITE_OTHER): Payer: No Typology Code available for payment source | Admitting: Family Medicine

## 2013-05-26 ENCOUNTER — Other Ambulatory Visit (HOSPITAL_COMMUNITY)
Admission: RE | Admit: 2013-05-26 | Discharge: 2013-05-26 | Disposition: A | Discharge status: Home / Self Care | Payer: No Typology Code available for payment source | Source: Ambulatory Visit | Attending: Family Medicine | Admitting: Family Medicine

## 2013-05-26 ENCOUNTER — Encounter: Payer: Self-pay | Admitting: Family Medicine

## 2013-05-26 VITALS — BP 140/98 | HR 101 | Temp 98.1°F | Ht 69.0 in | Wt 253.0 lb

## 2013-05-26 DIAGNOSIS — L0293 Carbuncle, unspecified: Secondary | ICD-10-CM

## 2013-05-26 DIAGNOSIS — IMO0002 Reserved for concepts with insufficient information to code with codable children: Secondary | ICD-10-CM

## 2013-05-26 DIAGNOSIS — Z01419 Encounter for gynecological examination (general) (routine) without abnormal findings: Secondary | ICD-10-CM | POA: Insufficient documentation

## 2013-05-26 DIAGNOSIS — L0292 Furuncle, unspecified: Secondary | ICD-10-CM

## 2013-05-26 DIAGNOSIS — R300 Dysuria: Secondary | ICD-10-CM

## 2013-05-26 DIAGNOSIS — E1165 Type 2 diabetes mellitus with hyperglycemia: Secondary | ICD-10-CM

## 2013-05-26 DIAGNOSIS — IMO0001 Reserved for inherently not codable concepts without codable children: Secondary | ICD-10-CM

## 2013-05-26 DIAGNOSIS — Z1151 Encounter for screening for human papillomavirus (HPV): Secondary | ICD-10-CM | POA: Insufficient documentation

## 2013-05-26 LAB — POCT URINALYSIS DIPSTICK
Glucose, UA: >1000
KETONES UA: 15
LEUKOCYTES UA: NEGATIVE
Nitrite, UA: NEGATIVE
PH UA: 5.5
Protein, UA: 30
RBC UA: NEGATIVE
Urobilinogen, UA: 0.2

## 2013-05-26 LAB — POCT UA - MICROSCOPIC ONLY

## 2013-05-26 LAB — POCT GLYCOSYLATED HEMOGLOBIN (HGB A1C): Hemoglobin A1C: 9.3

## 2013-05-26 MED ORDER — FREESTYLE SYSTEM KIT: 1.0000 | PACK | Status: DC | PRN

## 2013-05-26 MED ORDER — GLUCOSE BLOOD VI STRP: ORAL_STRIP | Status: DC

## 2013-05-26 MED ORDER — DOXYCYCLINE HYCLATE 100 MG PO TABS: 100.0000 mg | ORAL_TABLET | Freq: Two times a day (BID) | ORAL | Status: DC

## 2013-05-26 MED ORDER — FREESTYLE LANCETS MISC: Status: DC

## 2013-05-26 NOTE — Progress Notes (Signed)
Patient here for CPE, but also with complaints as below.    Vaginal "boil:"  Started a few months ago.  Describes what was previously a "hard knot" that was the size of a marble.  "Burst" about 2 weeks ago.  Much smaller but still present and draining.  Still painful.    Diabetes:  Currently on Lantus.  See OV and telephone notes for difficulty obtaining Lanuts   No adverse effects from medication.  Out of insulin for about 1 month, going to pick it up today.  No hypoglycemic events.  No paresthesia or peripheral nerve pain.  Measures blood sugars at home every:   day Lab Results  Component Value Date   HGBA1C 8.5 03/17/2013    Subjective:     Alicia West is a 43 y.o. female and is here for a comprehensive physical exam. The patient reports 2 problems, see separate Problem portion.  History   Social History  . Marital Status: Single    Spouse Name: N/A    Number of Children: N/A  . Years of Education: N/A   Occupational History  . Not on file.   Social History Main Topics  . Smoking status: Never Smoker   . Smokeless tobacco: Never Used  . Alcohol Use: No  . Drug Use: No  . Sexual Activity: Yes    Birth Control/ Protection: Coitus interruptus   Other Topics Concern  . Not on file   Social History Narrative  . No narrative on file   Health Maintenance  Topic Date Due  . Pneumococcal Polysaccharide Vaccine (##1) 09/08/2014 (Originally 01/24/1973)  . Ophthalmology Exam  07/02/2013  . Influenza Vaccine  09/05/2013  . Pap Smear  09/05/2013  . Hemoglobin A1c  11/25/2013  . Urine Microalbumin  12/10/2013  . Foot Exam  03/17/2014  . Tetanus/tdap  05/29/2021    The following portions of the patient's history were reviewed and updated as appropriate: allergies, current medications, past family history, past medical history, past social history, past surgical history and problem list.  Review of Systems A comprehensive review of systems was negative.   Objective:    BP 140/98  Pulse 101  Temp(Src) 98.1 F (36.7 C) (Oral)  Ht 5' 9"  (1.753 m)  Wt 253 lb (114.76 kg)  BMI 37.34 kg/m2  LMP 05/05/2013  Gen:  Alert, cooperative patient who appears stated age in no acute distress.  Vital signs reviewed. HEENT:  [place]/AT.  EOMI, PERRL.  MMM, tonsils non-erythematous, non-edematous.  External ears WNL, Bilateral TM's normal without retraction, redness or bulging.  Cardiac:  Regular rate and rhythm without murmur auscultated.  Good S1/S2. Pulm:  Clear to auscultation bilaterally with good air movement.  No wheezes or rales noted.   Abd:  Soft/nondistended/nontender.  Good bowel sounds throughout all four quadrants.  No masses noted.  GYN:  External genitalia within normal limits.  Does have what appears to be healing boil on mons pubis about 2 cm superior and lateral to clitoris.  Vaginal mucosa pink, moist, normal rugae.  Nonfriable cervix without lesions, no discharge or bleeding noted on speculum exam.  Bimanual exam revealed normal, nongravid uterus.  No cervical motion tenderness. No adnexal masses bilaterally.  Pap smear performed.  Psych:  Pleasant and conversant Neuro:  No focal deficits noted.    Assessment:    Healthy female exam. See separate problem list.        Plan:     See After Visit Summary for Counseling Recommendations  Health maintenance counseling: Anticipatory guidance:  Counseled on good nutrition, personal hygeine, stress management, exercise.  Rsk factor reduction:  Patient advised to increase fruits and vegetables to lower cholesterol and A1C, take her insulin more regularly, and increase her exercise.    Immunizations/Screenings:  Patient is up to date on her vaccinations and preventative care.  Recommended to receive flu shot during flu season.

## 2013-05-26 NOTE — Patient Instructions (Signed)
Take the antibiotics twice a day for 7 days until gone. If not working or too expensive, call me.  I have sent in strips and meter.  Testing your urine today.  I'll let you know what this shows.

## 2013-05-28 ENCOUNTER — Encounter: Payer: Self-pay | Admitting: Family Medicine

## 2013-05-29 DIAGNOSIS — L0292 Furuncle, unspecified: Secondary | ICD-10-CM | POA: Insufficient documentation

## 2013-05-29 NOTE — Progress Notes (Signed)
Subjective:    Alicia West is a 43 y.o. female who presents to [facility] Patient here for CPE, but also with complaints as below.    Vaginal "boil:"  Started a few months ago.  Describes what was previously a "hard knot" that was the size of a marble.  "Burst" about 2 weeks ago.  Much smaller but still present and draining.  Still painful.    Diabetes:  Currently on Lantus.  See OV and telephone notes for difficulty obtaining Lanuts   No adverse effects from medication.  Out of insulin for about 1 month, going to pick it up today.  No hypoglycemic events.  No paresthesia or peripheral nerve pain.  Measures blood sugars at home every:   Day.  Running 200s currently.  No polyuria/polydipsia.   Lab Results  Component Value Date   HGBA1C 8.5 03/17/2013    PMH reviewed.  Past Medical History  Diagnosis Date  . Diabetes mellitus   . Hypertension   . Herpes simplex labialis   . TB (tuberculosis) contact     + skin test,/- chest x-ray   No past surgical history on file.  Medications reviewed. Current Outpatient Prescriptions  Medication Sig Dispense Refill  . amLODipine (NORVASC) 5 MG tablet Take 1 tablet (5 mg total) by mouth daily.  90 tablet  1  . atorvastatin (LIPITOR) 40 MG tablet Take 1 tablet (40 mg total) by mouth daily.  90 tablet  3  . Cetirizine HCl 10 MG TBDP Take 1 tablet by mouth daily as needed. Sneezing and coughing      . doxycycline (VIBRA-TABS) 100 MG tablet Take 1 tablet (100 mg total) by mouth 2 (two) times daily.  14 tablet  0  . fluticasone (FLONASE) 50 MCG/ACT nasal spray Place 2 sprays into the nose 2 (two) times daily as needed. For allergies      . glipiZIDE (GLUCOTROL) 10 MG tablet Take 0.5-1 tablets (5-10 mg total) by mouth 2 (two) times daily. Take 1 tab in the morning and 1/2 tablet by mouth in the evening  45 tablet  5  . glucose blood (FREESTYLE TEST STRIPS) test strip Use as instructed  100 each  12  . glucose monitoring kit (FREESTYLE) monitoring kit 1  each by Does not apply route as needed for other. Use to test blood sugars 3 x daily  1 each  0  . insulin detemir (LEVEMIR) 100 UNIT/ML injection Inject 0.6 mLs (60 Units total) into the skin at bedtime.  10 mL  11  . insulin glargine (LANTUS) 100 UNIT/ML injection Inject 0.6 mLs (60 Units total) into the skin daily. Disp: 2 box  10 mL  6  . Insulin Pen Needle (RELION PEN NEEDLE 31G/8MM) 31G X 8 MM MISC Use to check blood sugars TID  100 each  11  . Lancets (FREESTYLE) lancets Use as instructed  100 each  12  . lisinopril-hydrochlorothiazide (PRINZIDE,ZESTORETIC) 20-25 MG per tablet TAKE ONE TABLET BY MOUTH EVERY DAY  90 tablet  3  . Lorcaserin HCl 10 MG TABS Take 10 mg by mouth 2 (two) times daily.  60 tablet  1  . metFORMIN (GLUCOPHAGE) 1000 MG tablet Take 1 tablet (1,000 mg total) by mouth 2 (two) times daily with a meal.  60 tablet  3   Current Facility-Administered Medications  Medication Dose Route Frequency Provider Last Rate Last Dose  . TDaP (BOOSTRIX) injection 0.5 mL  0.5 mL Intramuscular Once Alveda Reasons, MD  Objective:   Physical Exam BP 140/98  Pulse 101  Temp(Src) 98.1 F (36.7 C) (Oral)  Ht 5' 9"  (1.753 m)  Wt 253 lb (114.76 kg)  BMI 37.34 kg/m2  LMP 05/05/2013 HEENT:  [place]/AT.  EOMI, PERRL.  MMM, tonsils non-erythematous, non-edematous.  External ears WNL, Bilateral TM's normal without retraction, redness or bulging.  Cardiac:  Regular rate and rhythm without murmur auscultated.  Good S1/S2. Pulm:  Clear to auscultation bilaterally GYN:  External genitalia within normal limits.  Does have what appears to be healing boil on mons pubis about 2 cm superior and lateral to clitoris.  Vaginal mucosa pink, moist, normal rugae.  Nonfriable cervix without lesions, no discharge or bleeding noted on speculum exam.  Bimanual exam revealed normal, nongravid uterus.  No cervical motion tenderness. No adnexal masses bilaterally.  Pap smear performed.  Psych:  Pleasant and  conversant Neuro:  No focal deficits noted. No results found for this or any previous visit (from the past 72 hour(s)).

## 2013-05-29 NOTE — Assessment & Plan Note (Signed)
Already draining.  Treat with doxycycline to completion.

## 2013-05-29 NOTE — Assessment & Plan Note (Signed)
Has been out of insulin for ~1 month due to insurance status -- called them today and it is ready to pick up.   Restarting today. No red flags currently.  FU in 2-4 weeks to re-assess when back on insulin.

## 2013-07-22 ENCOUNTER — Telehealth: Payer: Self-pay | Admitting: Family Medicine

## 2013-07-22 NOTE — Telephone Encounter (Signed)
Would like to have a referral to a gyn provider or have another US performed.  Still having a lot of pain and bleeding

## 2013-07-28 NOTE — Telephone Encounter (Signed)
Needs to be seen for this.  Last [place] I have on file was from 2012.  May not need Gyn referral.

## 2013-07-28 NOTE — Telephone Encounter (Signed)
Mobile number is not a working number. Did not leave message on voicemail of work number it is has a female's name on it, will try again later to see if this is patient's number

## 2013-08-21 ENCOUNTER — Other Ambulatory Visit: Payer: Self-pay | Admitting: *Deleted

## 2013-08-21 DIAGNOSIS — E1165 Type 2 diabetes mellitus with hyperglycemia: Principal | ICD-10-CM

## 2013-08-21 DIAGNOSIS — IMO0001 Reserved for inherently not codable concepts without codable children: Secondary | ICD-10-CM

## 2013-08-21 MED ORDER — INSULIN GLARGINE 100 UNIT/ML ~~LOC~~ SOLN: 60.0000 [IU] | Freq: Every day | SUBCUTANEOUS | Status: DC

## 2013-08-24 ENCOUNTER — Telehealth: Payer: Self-pay | Admitting: *Deleted

## 2013-08-24 MED ORDER — INSULIN GLARGINE 100 UNIT/ML SOLOSTAR PEN: 60.0000 [IU] | PEN_INJECTOR | Freq: Every day | SUBCUTANEOUS | Status: DC

## 2013-08-24 NOTE — Telephone Encounter (Signed)
Need Rx for Lantus Solostar Inject 0.6 mLs (60 Units total) into the skin daily.  Please send to [place] [address].  Derl Barrow, RN

## 2013-08-24 NOTE — Telephone Encounter (Signed)
Done.  Durene Cal electronically.

## 2013-09-21 ENCOUNTER — Encounter (HOSPITAL_COMMUNITY): Payer: Self-pay | Admitting: Emergency Medicine

## 2013-09-21 ENCOUNTER — Emergency Department (HOSPITAL_COMMUNITY)
Admission: EM | Admit: 2013-09-21 | Discharge: 2013-09-21 | Disposition: A | Discharge status: Home / Self Care | Payer: No Typology Code available for payment source | Attending: Emergency Medicine | Admitting: Emergency Medicine

## 2013-09-21 ENCOUNTER — Telehealth: Payer: Self-pay | Admitting: Family Medicine

## 2013-09-21 ENCOUNTER — Emergency Department (HOSPITAL_COMMUNITY): Payer: No Typology Code available for payment source

## 2013-09-21 DIAGNOSIS — I10 Essential (primary) hypertension: Secondary | ICD-10-CM | POA: Insufficient documentation

## 2013-09-21 DIAGNOSIS — Y9229 Other specified public building as the place of occurrence of the external cause: Secondary | ICD-10-CM | POA: Diagnosis not present

## 2013-09-21 DIAGNOSIS — Z794 Long term (current) use of insulin: Secondary | ICD-10-CM | POA: Diagnosis not present

## 2013-09-21 DIAGNOSIS — R296 Repeated falls: Secondary | ICD-10-CM | POA: Insufficient documentation

## 2013-09-21 DIAGNOSIS — IMO0002 Reserved for concepts with insufficient information to code with codable children: Secondary | ICD-10-CM | POA: Diagnosis not present

## 2013-09-21 DIAGNOSIS — Y939 Activity, unspecified: Secondary | ICD-10-CM | POA: Diagnosis not present

## 2013-09-21 DIAGNOSIS — E119 Type 2 diabetes mellitus without complications: Secondary | ICD-10-CM | POA: Insufficient documentation

## 2013-09-21 DIAGNOSIS — S99929A Unspecified injury of unspecified foot, initial encounter: Secondary | ICD-10-CM

## 2013-09-21 DIAGNOSIS — Z8611 Personal history of tuberculosis: Secondary | ICD-10-CM | POA: Insufficient documentation

## 2013-09-21 DIAGNOSIS — S8990XA Unspecified injury of unspecified lower leg, initial encounter: Secondary | ICD-10-CM | POA: Insufficient documentation

## 2013-09-21 DIAGNOSIS — S9030XA Contusion of unspecified foot, initial encounter: Secondary | ICD-10-CM | POA: Insufficient documentation

## 2013-09-21 DIAGNOSIS — Z8619 Personal history of other infectious and parasitic diseases: Secondary | ICD-10-CM | POA: Insufficient documentation

## 2013-09-21 DIAGNOSIS — S8001XA Contusion of right knee, initial encounter: Secondary | ICD-10-CM

## 2013-09-21 DIAGNOSIS — Z791 Long term (current) use of non-steroidal anti-inflammatories (NSAID): Secondary | ICD-10-CM | POA: Insufficient documentation

## 2013-09-21 DIAGNOSIS — W1809XA Striking against other object with subsequent fall, initial encounter: Secondary | ICD-10-CM

## 2013-09-21 DIAGNOSIS — Z880 Allergy status to penicillin: Secondary | ICD-10-CM | POA: Diagnosis not present

## 2013-09-21 DIAGNOSIS — Z79899 Other long term (current) drug therapy: Secondary | ICD-10-CM | POA: Diagnosis not present

## 2013-09-21 DIAGNOSIS — S99919A Unspecified injury of unspecified ankle, initial encounter: Secondary | ICD-10-CM | POA: Insufficient documentation

## 2013-09-21 IMAGING — CR DG KNEE COMPLETE 4+V*R*
4 series · 4 of 4 positions shown · non-contrast
Comparison: [DATE]

CLINICAL DATA: Pain post trauma

EXAM:
RIGHT KNEE - COMPLETE 4+ VIEW

[t knee ap right]
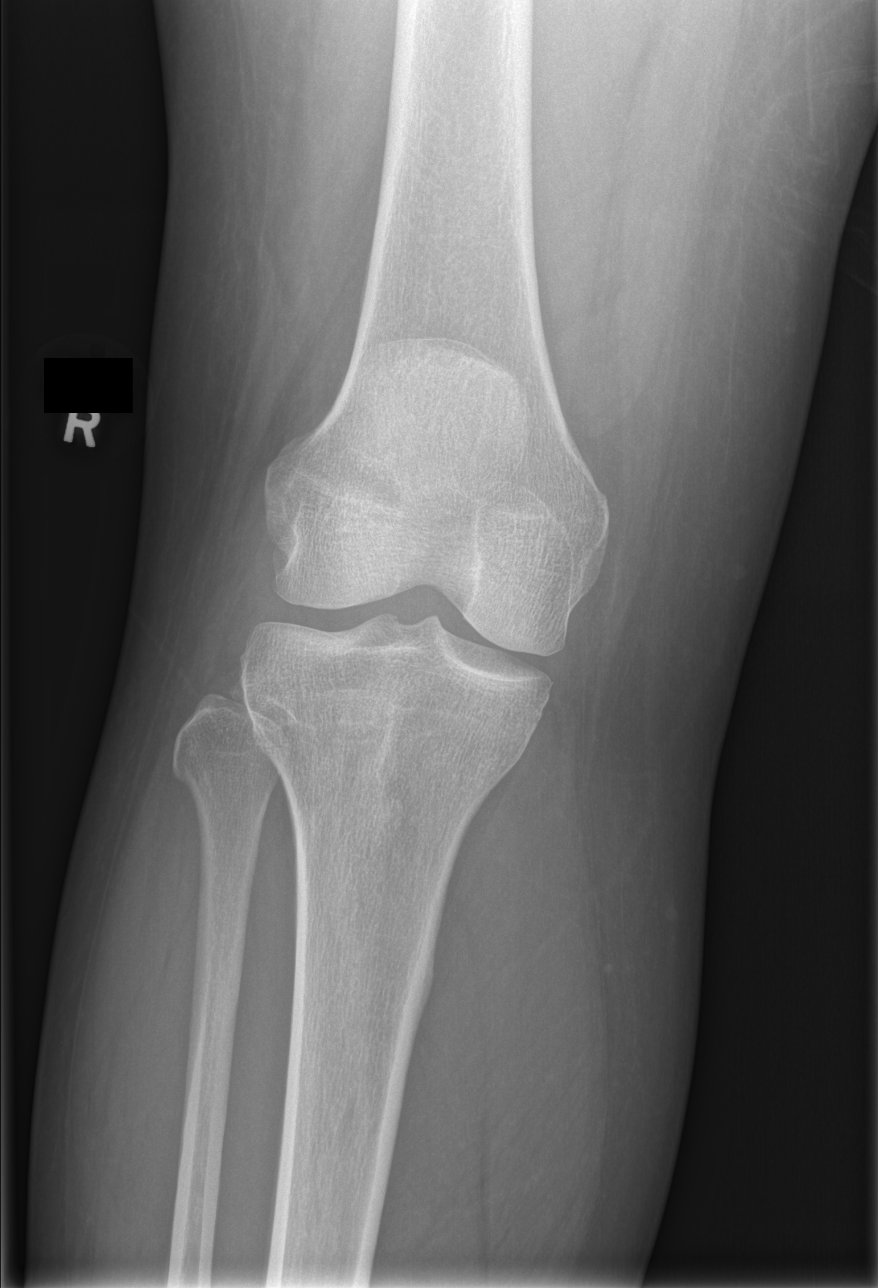

[t knee obl right (1 of 2)]
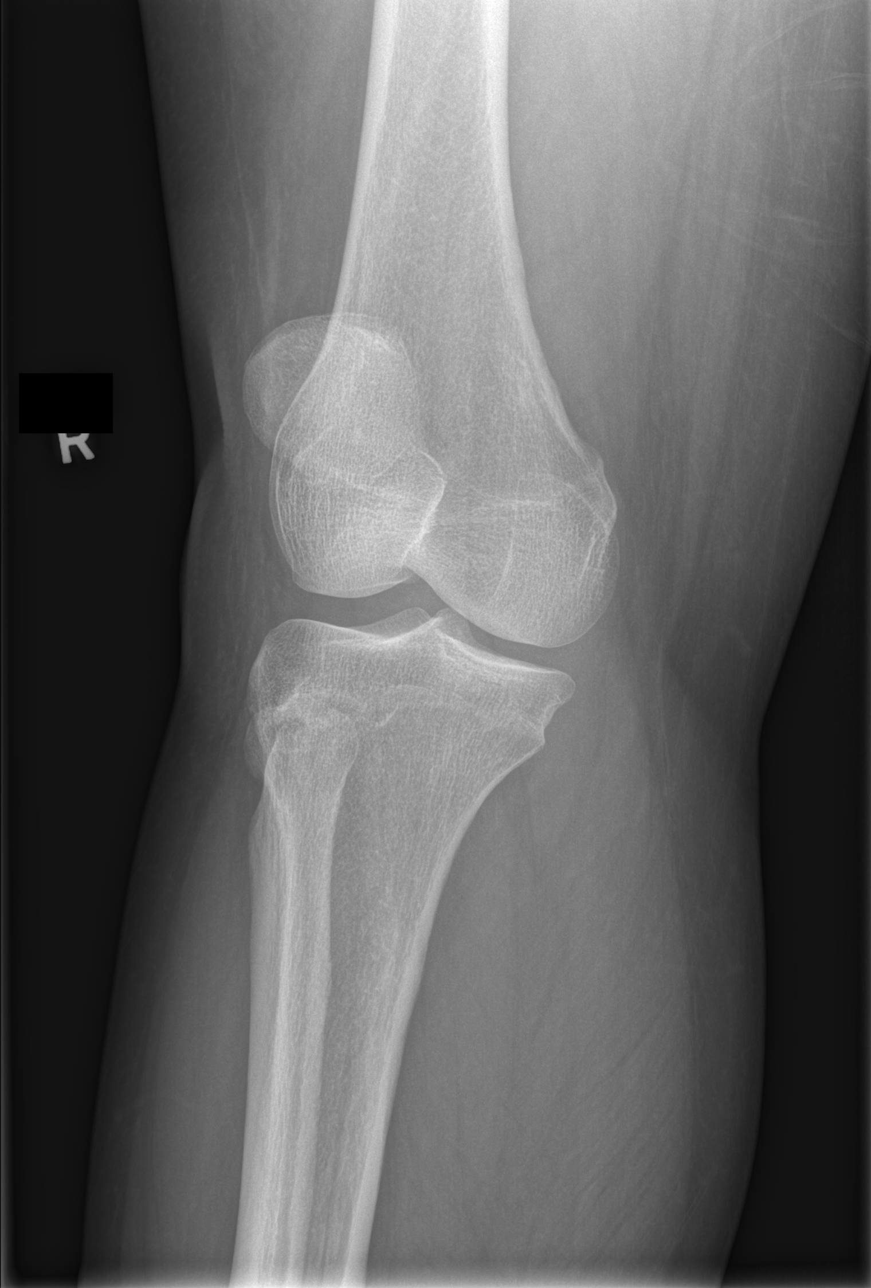

[t knee obl right (2 of 2)]
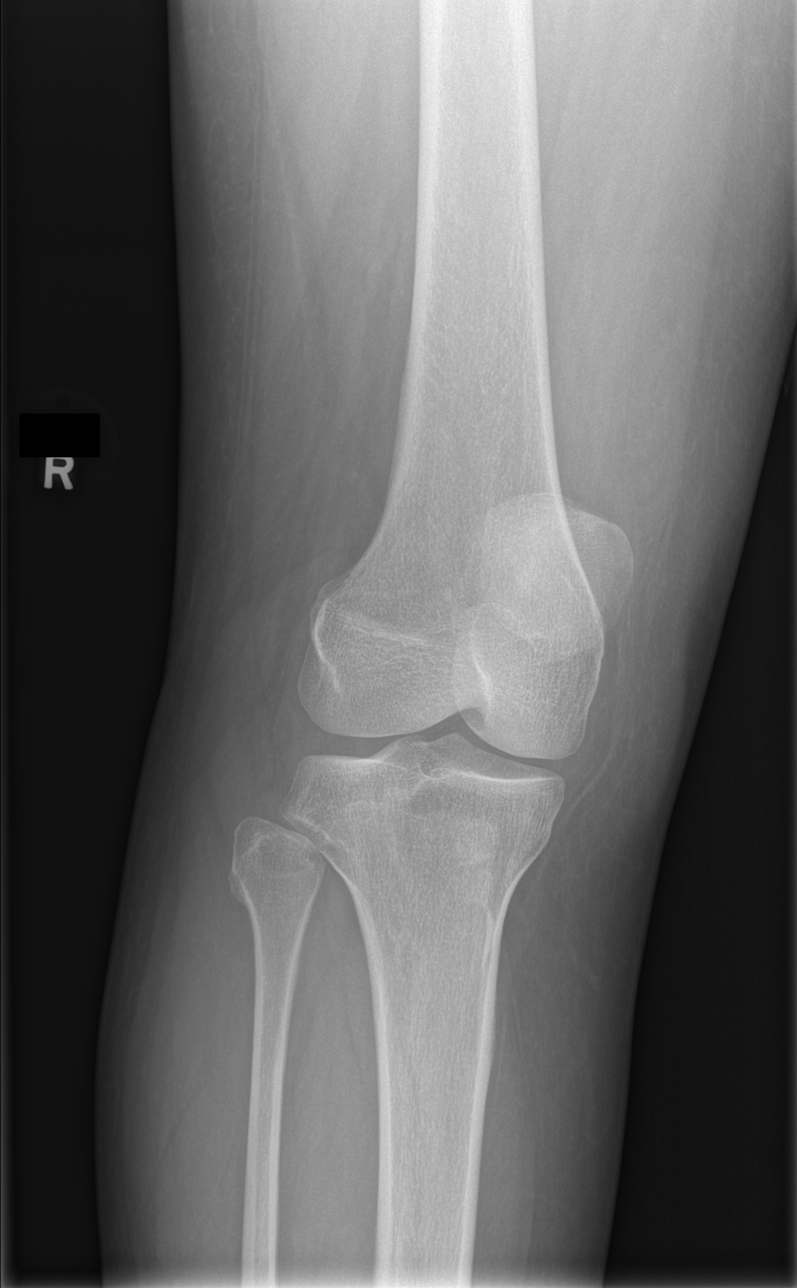

[t knee lat right]
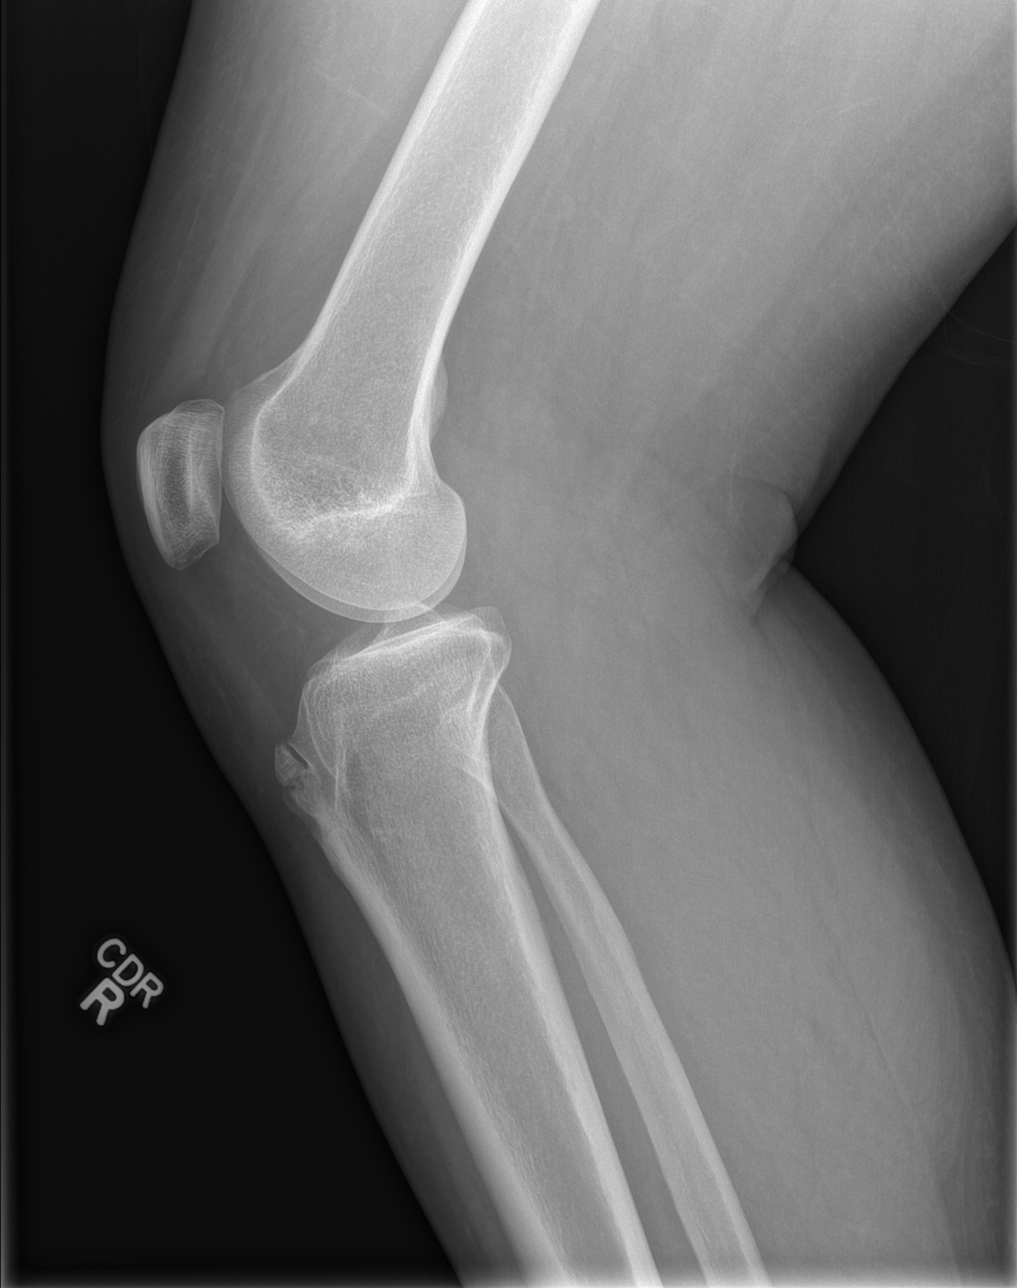

[4 of 4 positions shown; findings below may reference images not displayed]

FINDINGS: Frontal, lateral, and bilateral oblique views were obtained. There
is no acute fracture or dislocation. Joint spaces appear intact.
There is incomplete fusion along the anterior tibial tuberosity,
stable. Question old healed trauma in this area. No erosive change.
IMPRESSION: Suspect old trauma with remodeling anterior tibial tuberosity. No
acute fracture or effusion. No appreciable joint space narrowing.

## 2013-09-21 MED ORDER — TRAMADOL HCL 50 MG PO TABS
50.0000 mg | ORAL_TABLET | Freq: Four times a day (QID) | ORAL | Status: DC | PRN
Start: 2013-09-21 — End: 2014-03-18

## 2013-09-21 NOTE — ED Notes (Signed)
Pt reports falling at grocery store yesterday and landed on right knee and is having pain. Ambulatory at triage.

## 2013-09-21 NOTE — Telephone Encounter (Signed)
Pt fell over the weekend and didn't know if she should go to her Ortho doctor or come here. She also wanted to know if she needed a referral because of her insurance? jw

## 2013-09-21 NOTE — Discharge Instructions (Signed)
Contusion °A contusion is a deep bruise. Contusions are the result of an injury that caused bleeding under the skin. The contusion may turn blue, purple, or yellow. Minor injuries will give you a painless contusion, but more severe contusions may stay painful and swollen for a few weeks.  °CAUSES  °A contusion is usually caused by a blow, trauma, or direct force to an area of the body. °SYMPTOMS  °· Swelling and redness of the injured area. °· Bruising of the injured area. °· Tenderness and soreness of the injured area. °· Pain. °DIAGNOSIS  °The diagnosis can be made by taking a history and physical exam. An X-ray, CT scan, or MRI may be needed to determine if there were any associated injuries, such as fractures. °TREATMENT  °Specific treatment will depend on what area of the body was injured. In general, the best treatment for a contusion is resting, icing, elevating, and applying cold compresses to the injured area. Over-the-counter medicines may also be recommended for pain control. Ask your caregiver what the best treatment is for your contusion. °HOME CARE INSTRUCTIONS  °· Put ice on the injured area. °¨ Put ice in a plastic bag. °¨ Place a towel between your skin and the bag. °¨ Leave the ice on for 15-20 minutes, 3-4 times a day, or as directed by your health care provider. °· Only take over-the-counter or prescription medicines for pain, discomfort, or fever as directed by your caregiver. Your caregiver may recommend avoiding anti-inflammatory medicines (aspirin, ibuprofen, and naproxen) for 48 hours because these medicines may increase bruising. °· Rest the injured area. °· If possible, elevate the injured area to reduce swelling. °SEEK IMMEDIATE MEDICAL CARE IF:  °· You have increased bruising or swelling. °· You have pain that is getting worse. °· Your swelling or pain is not relieved with medicines. °MAKE SURE YOU:  °· Understand these instructions. °· Will watch your condition. °· Will get help right  away if you are not doing well or get worse. °Document Released: 11/01/2004 Document Revised: 01/27/2013 Document Reviewed: 11/27/2010 °ExitCare® Patient Information ©2015 [employer]. This information is not intended to replace advice given to you by your health care provider. Make sure you discuss any questions you have with your health care provider. ° °

## 2013-09-21 NOTE — ED Provider Notes (Signed)
CSN: [id number]     Arrival date & time 09/21/13  1340 History  This chart was scribed for non-physician practitioner, Delos Haring, PA-C working with Carmin Muskrat, MD by Frederich Balding, ED scribe. This patient was seen in room TR08C/TR08C and the patient's care was started at 3:42 PM.    Chief Complaint  Patient presents with  . Fall  . Knee Pain   The history is provided by the patient. No language interpreter was used.   HPI Comments: Alicia West is a 43 y.o. female who presents to the Emergency Department complaining of a fall that occurred yesterday around 3 PM. States she fell in the grocery store on a grape and landed on her right knee. She has sudden onset pain with associated mild swelling. Bearing weight worsens the pain. Pt has been taking aleve and mobic with no relief. Her orthopedist is at [place].   Past Medical History  Diagnosis Date  . Diabetes mellitus   . Hypertension   . Herpes simplex labialis   . TB (tuberculosis) contact     + skin test,/- chest x-ray   History reviewed. No pertinent past surgical history. History reviewed. No pertinent family history. History  Substance Use Topics  . Smoking status: Never Smoker   . Smokeless tobacco: Never Used  . Alcohol Use: No   OB History   Grav Para Term Preterm Abortions TAB SAB Ect Mult Living                 Review of Systems  Musculoskeletal: Positive for arthralgias and joint swelling.  All other systems reviewed and are negative.  Allergies  Penicillins  Home Medications   Prior to Admission medications   Medication Sig Start Date End Date Taking? Authorizing Provider  amLODipine (NORVASC) 5 MG tablet Take 5 mg by mouth every evening.   Yes Historical Provider, MD  atorvastatin (LIPITOR) 40 MG tablet Take 40 mg by mouth every evening.   Yes Historical Provider, MD  cetirizine (ZYRTEC) 10 MG tablet Take 10 mg by mouth daily.   Yes Historical Provider, MD  glipiZIDE  (GLUCOTROL) 10 MG tablet Take 5-10 mg by mouth 2 (two) times daily before a meal. *takes 32m in the morning and 566min the evening*   Yes Historical Provider, MD  insulin glargine (LANTUS) 100 UNIT/ML injection Inject 60 Units into the skin every morning.   Yes Historical Provider, MD  lisinopril-hydrochlorothiazide (PRINZIDE,ZESTORETIC) 20-25 MG per tablet Take 1 tablet by mouth every evening.   Yes Historical Provider, MD  metFORMIN (GLUCOPHAGE) 500 MG tablet Take 500 mg by mouth 2 (two) times daily with a meal.   Yes Historical Provider, MD  mometasone (NASONEX) 50 MCG/ACT nasal spray Place 2 sprays into the nose daily.   Yes Historical Provider, MD  naproxen sodium (ANAPROX) 220 MG tablet Take 440 mg by mouth daily as needed (for pain).   Yes Historical Provider, MD  traMADol (ULTRAM) 50 MG tablet Take 1 tablet (50 mg total) by mouth every 6 (six) hours as needed. 09/21/13   Tivon Lemoine G Marilu FavrePA-C   BP 119/84  Pulse 95  Temp(Src) 98.6 F (37 C) (Oral)  Resp 20  Ht 5' 9"  (1.753 m)  Wt 251 lb 1.6 oz (113.898 kg)  BMI 37.06 kg/m2  SpO2 100%  LMP 08/27/2013  Physical Exam  Nursing note and vitals reviewed. Constitutional: She is oriented to person, place, and time. She appears well-developed and well-nourished. No distress.  HENT:  Head: Normocephalic and atraumatic.  Eyes: Conjunctivae and EOM are normal.  Neck: Neck supple. No tracheal deviation present.  Cardiovascular: Normal rate.   Pulmonary/Chest: Effort normal. No respiratory distress.  Musculoskeletal: Normal range of motion.  Tenderness to palpation of tibial tuberosity. Small effusion. Decreased strength in right lower extremity due to pain. No ecchymosis or deformity.   Neurological: She is alert and oriented to person, place, and time.  Skin: Skin is warm and dry.  Psychiatric: She has a normal mood and affect. Her behavior is normal.    ED Course  Procedures (including critical care time)  DIAGNOSTIC  STUDIES: Oxygen Saturation is 99% on RA, normal by my interpretation.    COORDINATION OF CARE: 3:47 PM-Advised pt of xray results. Discussed treatment plan which includes knee sleeve and pain medication with pt at bedside and pt agreed to plan. Pt decline crutches. Advised pt to follow up with her orthopedist if symptoms do not start resolving.   Labs Review Labs Reviewed - No data to display  Imaging Review Dg Knee Complete 4 Views Right  09/21/2013   CLINICAL DATA:  Pain post trauma  EXAM: RIGHT KNEE - COMPLETE 4+ VIEW  COMPARISON:  April 02, 2012  FINDINGS: Frontal, lateral, and bilateral oblique views were obtained. There is no acute fracture or dislocation. Joint spaces appear intact. There is incomplete fusion along the anterior tibial tuberosity, stable. Question old healed trauma in this area. No erosive change.  IMPRESSION: 36 old trauma with remodeling anterior tibial tuberosity. No acute fracture or effusion. No appreciable joint space narrowing.   Electronically Signed   By: Lowella Grip M.D.   On: 09/21/2013 15:31     EKG Interpretation None      MDM   Final diagnoses:  Fall against object, initial encounter  Knee contusion, right, initial encounter    43 y.o.Alicia West's evaluation in the Emergency Department is complete. It has been determined that no acute conditions requiring further emergency intervention are present at this time. The patient/guardian have been advised of the diagnosis and plan. We have discussed signs and symptoms that warrant return to the ED, such as changes or worsening in symptoms.  Vital signs are stable at discharge. Filed Vitals:   09/21/13 1559  BP: 119/84  Pulse: 95  Temp: 98.6 F (37 C)  Resp: 20    Patient/guardian has voiced understanding and agreed to follow-up with the PCP or specialist.   I personally performed the services described in this documentation, which was scribed in my presence. The recorded  information has been reviewed and is accurate.  Linus Mako, PA-C 09/22/13 [phone]

## 2013-09-22 NOTE — ED Provider Notes (Signed)
  Medical screening examination/treatment/procedure(s) were performed by non-physician practitioner and as supervising physician I was immediately available for consultation/collaboration.   EKG Interpretation None         Carmin Muskrat, MD 09/22/13 0930

## 2013-09-28 ENCOUNTER — Encounter: Payer: Self-pay | Admitting: Sports Medicine

## 2013-09-28 ENCOUNTER — Ambulatory Visit (INDEPENDENT_AMBULATORY_CARE_PROVIDER_SITE_OTHER): Payer: No Typology Code available for payment source | Admitting: Sports Medicine

## 2013-09-28 VITALS — BP 138/97 | Ht 69.0 in | Wt 251.0 lb

## 2013-09-28 DIAGNOSIS — M25561 Pain in right knee: Secondary | ICD-10-CM

## 2013-09-28 DIAGNOSIS — M25569 Pain in unspecified knee: Secondary | ICD-10-CM

## 2013-09-28 NOTE — Progress Notes (Signed)
   Subjective:    Patient ID: Alicia West, female    DOB: [birth year], 43 y.o.   MRN: [id number]  HPI 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 One week ago, she fell at the grocery store and landed directly on the right anterior tibial tuberosity. She went to the ED, got an x-ray which was negative for a new fracture, and was given tramadol for pain management. Since then, her pain has not improved. She also has not tolerated the tramadol.    Review of Systems     Objective:   Physical Exam Gen: well-dressed, obese woman sitting comfortably.  Right knee: no swelling or erythema. Prominent tibial tuberosity. Full range of motion. Point tenderness over the tibial tuberosity. No tenderness over patellar or quad tendons nor over joint lines.   Gait: non-antalgic gait  X-rays of the right knee from 09/21/2013 are reviewed. Images are compared to previous x-rays of this same right knee. Overall x-rays are unchanged. No acute findings appreciated. Once again seen is some fragmentation at the tibial tubercle consistent with previous Osgood-Schlatter's. No joint effusion is seen.    Assessment & Plan:  **Right knee pain: -Reassurance - likely bone contusion that aggravated her chronic knee pain 2/2 tibial tuberosity fragmentation - ACE wrap for compression, ice 2-3x/day, asper cream - follow up PRN  Written by: Ivan Anchors, MS4

## 2013-09-29 ENCOUNTER — Ambulatory Visit (INDEPENDENT_AMBULATORY_CARE_PROVIDER_SITE_OTHER): Payer: No Typology Code available for payment source | Admitting: Family Medicine

## 2013-09-29 VITALS — BP 137/85 | HR 97 | Temp 99.1°F | Ht 69.0 in | Wt 252.0 lb

## 2013-09-29 DIAGNOSIS — C069 Malignant neoplasm of mouth, unspecified: Secondary | ICD-10-CM

## 2013-09-29 DIAGNOSIS — J029 Acute pharyngitis, unspecified: Secondary | ICD-10-CM

## 2013-09-29 DIAGNOSIS — J069 Acute upper respiratory infection, unspecified: Secondary | ICD-10-CM

## 2013-09-29 LAB — POCT RAPID STREP A (OFFICE): RAPID STREP A SCREEN: NEGATIVE

## 2013-09-30 ENCOUNTER — Ambulatory Visit: Payer: No Typology Code available for payment source | Admitting: Sports Medicine

## 2013-10-01 ENCOUNTER — Telehealth: Payer: Self-pay | Admitting: Family Medicine

## 2013-10-01 DIAGNOSIS — C069 Malignant neoplasm of mouth, unspecified: Secondary | ICD-10-CM | POA: Insufficient documentation

## 2013-10-01 NOTE — Telephone Encounter (Signed)
[facility] red team, please call pt and inform her of the following:  Her strep test was negative on Tuesday. She left her appointment without telling anyone she was leaving, so I was unable to finish counseling her at the end of her visit. I suspect most of her symptoms are related to a viral upper respiratory infection. If her symptoms have not been improving, she should schedule an office visit with a physician to be re-evaluated. I also recommend she see [name] to discuss the diagnosis of dental cancer she mentioned during her visit with me.  Thanks! Leeanne Rio, MD

## 2013-10-01 NOTE — Assessment & Plan Note (Addendum)
History somewhat vague. Advised pt during history portion of today's visit that she will need to follow up with her dentist and [name] to discuss this in greater detail since this is an ongoing issue and today's visit was a same-day appointment. Will add to problem list and route note to PCP to inform him of this development in her medical history.

## 2013-10-01 NOTE — Progress Notes (Signed)
Patient ID: Alicia West, female   DOB: Jan 01, 1971, 43 y.o.   MRN: [id number]  HPI:  Pt presents for a same day appointment to discuss URI sx's. She is displeased that she was not told ahead of time that she would not be seeing her PCP Dr. Mingo West at this visit.  Has had headache, sore throat, cough, fatigue for the last few days. States she always has nasal congestion. Has felt nauseated but has not vomited. Has been eating and drinking less because of sore throat and nausea. No sick contacts. Has taken ibuprofen for pain. Throat more sore on the R side.  She notes a hx of being told she has some form of dental/oral cancer by her dentist back in November of 2014. She has not been seen by the dentist since around that time. States she does not have dental insurance and thus hasn't been able to go back for treatment. She did have some dental work done after this first diagnosis but hasn't returned to receive the full treatments by her dentist. She isn't able to provide any more specific information about this.  ROS: See HPI  [place]: hx obesity, iron def anemia, depression, osgood-schlatter's, HLD, HTN, allergic rhinitis, T2DM  PHYSICAL EXAM: BP 137/85  Pulse 97  Temp(Src) 99.1 F (37.3 C) (Oral)  Ht 5' 9"  (1.753 m)  Wt 252 lb (114.306 kg)  BMI 37.20 kg/m2  LMP 08/27/2013 Gen: NAD HEENT: NCAT, MMM, oropharynx with some R sided tonsillar enlargement, no exudates visible to me. PERRL. + tender shoddy lymph nodes R worse than L. No visible disease of teeth or gums. Opens mouth easily without obvious trismus. Heart: RRR, no murmurs Lungs: CTAB, NWOB Abdomen: soft, nontender to palpation Neuro: cranial nerves II-XII tested and intact. Speech normal without hot potato voice. Full strength in all extremities. Normal FNF. Alert and oriented.  ASSESSMENT/PLAN:  1. URI sx's: Suspect headache, sore throat, cough, fatigue secondary to viral URI. Rapid strep test done by nursing staff negative,  although low clinical suspicion for strep pharyngitis at this time. Patient left the office before completion of the visit without telling anyone she was leaving. I was therefore unable to inform her of her negative strep test. I would suspect her symptoms to improve gradually with supportive care over the next few days. If they are not improving, she should return to be seen again to rule out other etiologies like peritonsillar abscess. Will have nursing staff call pt to inform her of negative test and offer follow up appointment if she does not start feeling better.  2. Hx of dental/oral cancer: History somewhat vague. Advised pt during history that she will need to follow up with her dentist and Dr. Mingo West to discuss this in greater detail since this is an ongoing issue and today's visit was a same-day appointment. Will add to problem list and route note to PCP to inform him of this development in her medical history.  FOLLOW UP: F/u as needed if symptoms worsen or do not improve.  F/u with PCP/dentist to discuss diagnosis of dental cancer.  [place]. Ardelia Mems, [place]

## 2013-10-05 NOTE — Telephone Encounter (Signed)
Tried calling, no answer, no voicemail.

## 2013-10-19 ENCOUNTER — Encounter: Payer: Self-pay | Admitting: *Deleted

## 2013-10-20 ENCOUNTER — Ambulatory Visit: Payer: No Typology Code available for payment source | Admitting: Family Medicine

## 2013-10-27 ENCOUNTER — Other Ambulatory Visit: Payer: Self-pay | Admitting: Family Medicine

## 2013-12-10 ENCOUNTER — Telehealth: Payer: Self-pay | Admitting: Family Medicine

## 2013-12-10 ENCOUNTER — Other Ambulatory Visit: Payer: Self-pay

## 2013-12-10 DIAGNOSIS — N644 Mastodynia: Secondary | ICD-10-CM

## 2013-12-10 NOTE — Telephone Encounter (Signed)
Please place [facility].  Call patient when appt has been made

## 2013-12-25 ENCOUNTER — Other Ambulatory Visit: Payer: Self-pay | Admitting: Family Medicine

## 2014-01-26 ENCOUNTER — Ambulatory Visit: Payer: Self-pay

## 2014-02-24 ENCOUNTER — Ambulatory Visit: Payer: No Typology Code available for payment source | Admitting: Family Medicine

## 2014-02-27 ENCOUNTER — Other Ambulatory Visit: Payer: Self-pay | Admitting: Family Medicine

## 2014-03-01 ENCOUNTER — Other Ambulatory Visit: Payer: Self-pay | Admitting: Family Medicine

## 2014-03-02 ENCOUNTER — Ambulatory Visit: Payer: No Typology Code available for payment source | Admitting: Family Medicine

## 2014-03-09 ENCOUNTER — Encounter: Payer: Self-pay | Admitting: Family Medicine

## 2014-03-09 ENCOUNTER — Ambulatory Visit (INDEPENDENT_AMBULATORY_CARE_PROVIDER_SITE_OTHER): Payer: No Typology Code available for payment source | Admitting: Family Medicine

## 2014-03-09 VITALS — BP 139/91 | HR 101 | Temp 98.4°F | Ht 69.0 in | Wt 252.1 lb

## 2014-03-09 DIAGNOSIS — E1165 Type 2 diabetes mellitus with hyperglycemia: Secondary | ICD-10-CM

## 2014-03-09 DIAGNOSIS — N939 Abnormal uterine and vaginal bleeding, unspecified: Secondary | ICD-10-CM

## 2014-03-09 DIAGNOSIS — N921 Excessive and frequent menstruation with irregular cycle: Secondary | ICD-10-CM

## 2014-03-09 DIAGNOSIS — IMO0002 Reserved for concepts with insufficient information to code with codable children: Secondary | ICD-10-CM

## 2014-03-09 DIAGNOSIS — R1011 Right upper quadrant pain: Secondary | ICD-10-CM

## 2014-03-09 LAB — CBC
HCT: 36.2 % (ref 36.0–46.0)
Hemoglobin: 12.1 g/dL (ref 12.0–15.0)
MCH: 26.7 pg (ref 26.0–34.0)
MCHC: 33.4 g/dL (ref 30.0–36.0)
MCV: 79.7 fL (ref 78.0–100.0)
MPV: 8.6 fL (ref 8.6–12.4)
Platelets: 531 10*3/uL — ABNORMAL HIGH (ref 150–400)
RBC: 4.54 MIL/uL (ref 3.87–5.11)
RDW: 15.3 % (ref 11.5–15.5)
WBC: 8.6 10*3/uL (ref 4.0–10.5)

## 2014-03-09 LAB — BASIC METABOLIC PANEL
BUN: 8 mg/dL (ref 6–23)
CALCIUM: 9.7 mg/dL (ref 8.4–10.5)
CO2: 29 meq/L (ref 19–32)
Chloride: 102 mEq/L (ref 96–112)
Creat: 0.71 mg/dL (ref 0.50–1.10)
Glucose, Bld: 168 mg/dL — ABNORMAL HIGH (ref 70–99)
POTASSIUM: 3.5 meq/L (ref 3.5–5.3)
Sodium: 138 mEq/L (ref 135–145)

## 2014-03-09 LAB — POCT GLYCOSYLATED HEMOGLOBIN (HGB A1C): Hemoglobin A1C: 10.9

## 2014-03-09 LAB — LDL CHOLESTEROL, DIRECT: LDL DIRECT: 85 mg/dL

## 2014-03-09 MED ORDER — IBUPROFEN 800 MG PO TABS: 800.0000 mg | ORAL_TABLET | Freq: Three times a day (TID) | ORAL | Status: DC | PRN

## 2014-03-09 NOTE — Patient Instructions (Signed)
Stop the Glipizide.  Make an appointment to see our pharmacist Dr. Valentina Lucks to talk about diabetes.  I'm sending you for an ultrasound.  Bloodwork today.   I've sent in the Ibuprofen as needed.   I'll call you with the results.

## 2014-03-09 NOTE — Progress Notes (Signed)
Subjective:    Alicia West is a 44 y.o. female who presents to [facility] today for several issues:  1.  Cramping/Vaginal spotting:  Present for past 3 months or so.  States she has spotting on most days, though occasinoally will go about a week without any bleeding.  Last real LMP was in mid October, unsure of exact date.  Negative pregnancy test at home.  Does have worsened carmping after intercourse.    2.  Diabetes:  Currently on Metformin 500 twice daily and Lantus 60 units.  Still taking Glipizide.   Has had some hypoglycemic events, relatively, in AM.  Decribes sweatiness, hunger, lightheadedness. Resolved with food intake.  Has NOT checked her blood sugars during any of these episodes.  Has had 3-4 in past month or so.  No paresthesia or peripheral nerve pain.  Measures blood sugars at home every:   Day.  States they run ~150s in AM fasting.  Did NOT bring meter in today.   Lab Results  Component Value Date   HGBA1C 10.9 03/09/2014   3.  RUQ Pain:  Present off and of "for years."  Worse after fatty foods.  Daughter is a Marine scientist and has been trying to work with her to change her diet.  No N/V.  No fevers.    ROS as above per HPI, otherwise neg.   The following portions of the patient's history were reviewed and updated as appropriate: allergies, current medications, past medical history, family and social history, and problem list. Patient is a nonsmoker.    PMH reviewed.  Past Medical History  Diagnosis Date  . Diabetes mellitus   . Hypertension   . Herpes simplex labialis   . TB (tuberculosis) contact     + skin test,/- chest x-ray   No past surgical history on file.  Medications reviewed. Current Outpatient Prescriptions  Medication Sig Dispense Refill  . amLODipine (NORVASC) 5 MG tablet TAKE 1 TABLET BY MOUTH DAILY 30 tablet 2  . atorvastatin (LIPITOR) 40 MG tablet Take 40 mg by mouth every evening.    . cetirizine (ZYRTEC) 10 MG tablet Take 10 mg by mouth daily.    Marland Kitchen  glipiZIDE (GLUCOTROL) 10 MG tablet TAKE 1 TABLET IN THE MORNING AND 1/2 TABLET BY MOUTH IN THE EVENING 45 tablet 4  . insulin glargine (LANTUS) 100 UNIT/ML injection Inject 60 Units into the skin every morning.    Marland Kitchen lisinopril-hydrochlorothiazide (PRINZIDE,ZESTORETIC) 20-25 MG per tablet TAKE 1 TABLET BY MOUTH DAILY 30 tablet 3  . metFORMIN (GLUCOPHAGE) 1000 MG tablet TAKE 1 TABLET BY MOUTH TWICE DAILY WITH A MEAL 60 tablet 2  . metFORMIN (GLUCOPHAGE) 500 MG tablet Take 500 mg by mouth 2 (two) times daily with a meal.    . mometasone (NASONEX) 50 MCG/ACT nasal spray Place 2 sprays into the nose daily.    . naproxen sodium (ANAPROX) 220 MG tablet Take 440 mg by mouth daily as needed (for pain).    . traMADol (ULTRAM) 50 MG tablet Take 1 tablet (50 mg total) by mouth every 6 (six) hours as needed. 20 tablet 0   Current Facility-Administered Medications  Medication Dose Route Frequency Provider Last Rate Last Dose  . TDaP (BOOSTRIX) injection 0.5 mL  0.5 mL Intramuscular Once Alveda Reasons, MD         Objective:   Physical Exam BP 139/91 mmHg  Pulse 101  Temp(Src) 98.4 F (36.9 C) (Oral)  Ht 5' 9"  (1.753 m)  Wt 252  lb 1.6 oz (114.352 kg)  BMI 37.21 kg/m2  LMP 03/04/2014 Gen:  Alert, cooperative patient who appears stated age in no acute distress.  Vital signs reviewed. HEENT: EOMI,  MMM Cardiac:  Regular rate and rhythm  Pulm:  Clear to auscultation bilaterally   Abd:  Soft/nondistended/nontender.    Exts: Non edematous BL  LE, warm and well perfused.   Results for orders placed or performed in visit on 03/09/14 (from the past 72 hour(s))  HgB A1c     Status: Abnormal   Collection Time: 03/09/14 10:36 AM  Result Value Ref Range   Hemoglobin A1C 10.9

## 2014-03-11 ENCOUNTER — Telehealth: Payer: Self-pay | Admitting: Family Medicine

## 2014-03-11 ENCOUNTER — Ambulatory Visit (HOSPITAL_COMMUNITY)
Admission: RE | Admit: 2014-03-11 | Discharge: 2014-03-11 | Disposition: A | Discharge status: Home / Self Care | Payer: No Typology Code available for payment source | Source: Ambulatory Visit | Attending: Family Medicine | Admitting: Family Medicine

## 2014-03-11 DIAGNOSIS — N939 Abnormal uterine and vaginal bleeding, unspecified: Secondary | ICD-10-CM | POA: Diagnosis not present

## 2014-03-11 DIAGNOSIS — R140 Abdominal distension (gaseous): Secondary | ICD-10-CM | POA: Insufficient documentation

## 2014-03-11 DIAGNOSIS — R1011 Right upper quadrant pain: Secondary | ICD-10-CM | POA: Insufficient documentation

## 2014-03-11 DIAGNOSIS — K760 Fatty (change of) liver, not elsewhere classified: Secondary | ICD-10-CM | POA: Insufficient documentation

## 2014-03-11 DIAGNOSIS — D259 Leiomyoma of uterus, unspecified: Secondary | ICD-10-CM | POA: Diagnosis not present

## 2014-03-11 DIAGNOSIS — N854 Malposition of uterus: Secondary | ICD-10-CM | POA: Diagnosis not present

## 2014-03-11 IMAGING — US US PELVIS COMPLETE
1 series · 14 of 25 positions shown · non-contrast
Comparison: None

CLINICAL DATA: Abnormal uterine bleeding, pain

EXAM:
TRANSABDOMINAL AND TRANSVAGINAL ULTRASOUND OF PELVIS
TECHNIQUE: Both transabdominal and transvaginal ultrasound examinations of the
pelvis were performed. Transabdominal technique was performed for
global imaging of the pelvis including uterus, ovaries, adnexal
regions, and pelvic cul-de-sac. It was necessary to proceed with
endovaginal exam following the transabdominal exam to visualize the
uterus and bilateral ovaries.

[Series 1: us pelvis complete · 14 of 30 slices shown]
[im 1/30]
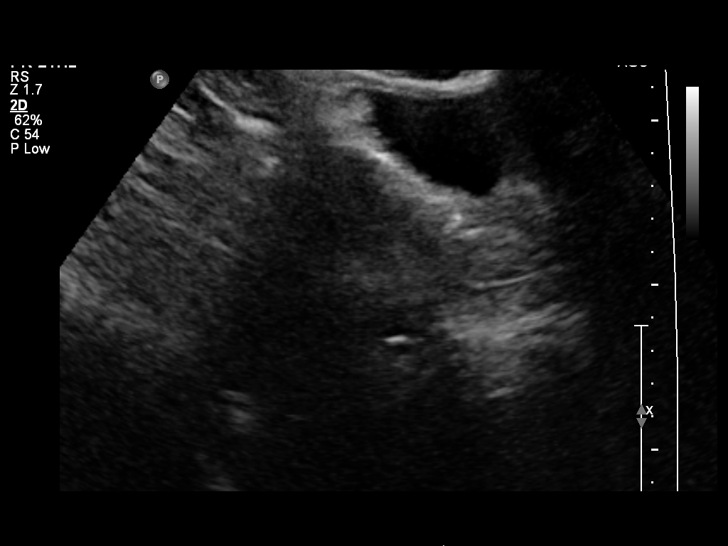
[im 3/30]
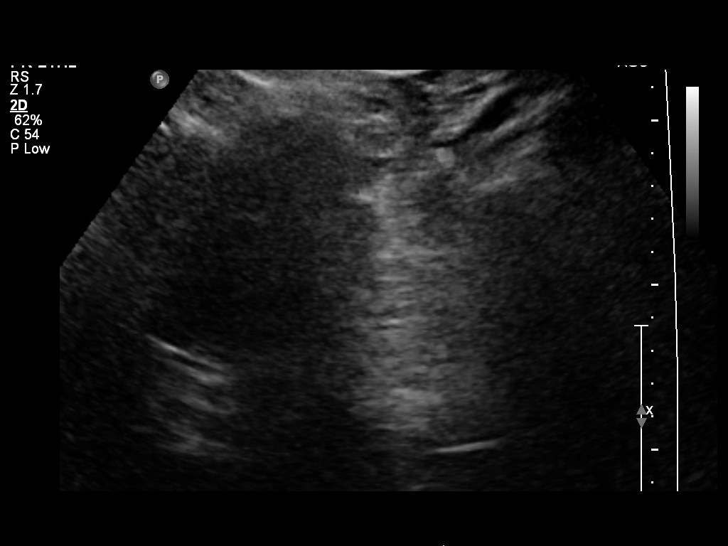
[im 5/30]
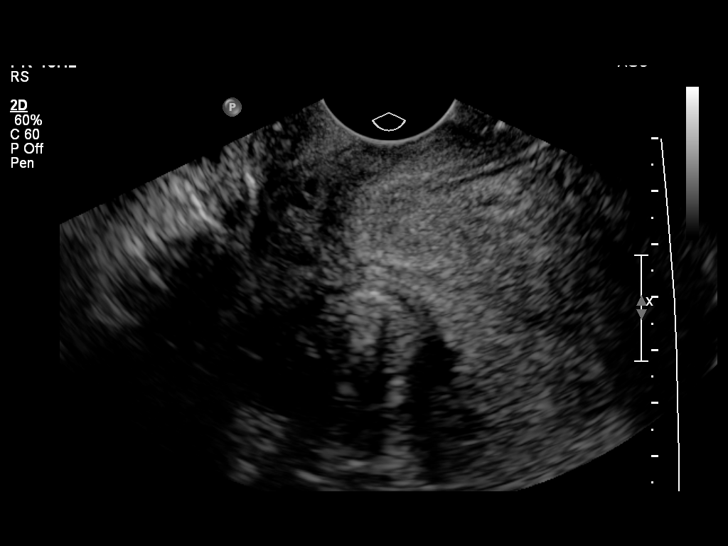
[im 8/30]
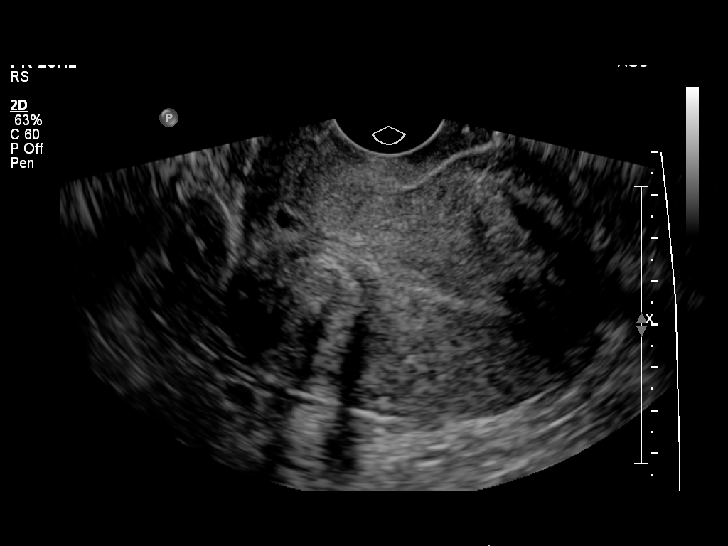
[im 10/30]
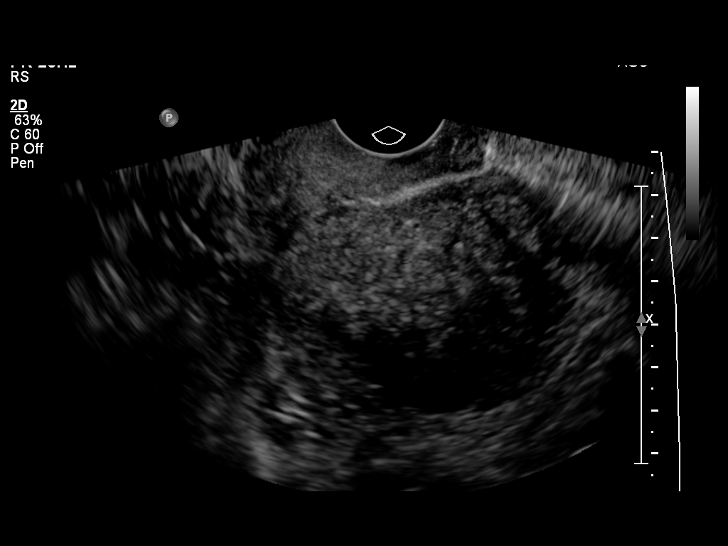
[im 11/30]
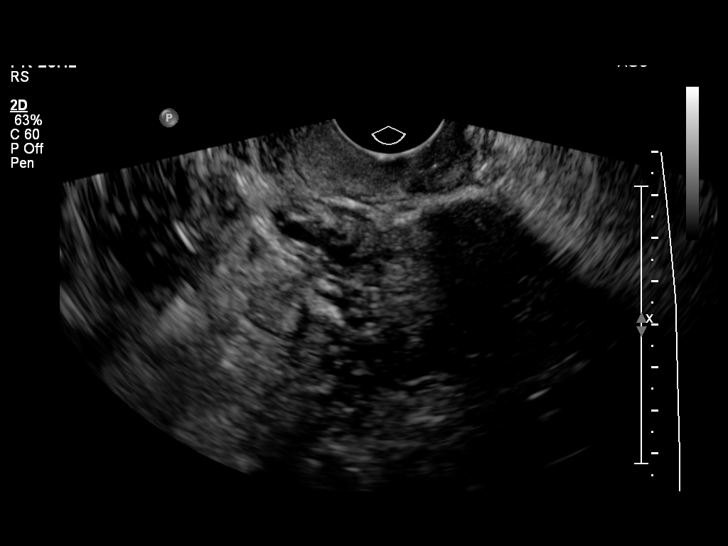
[im 14/30]
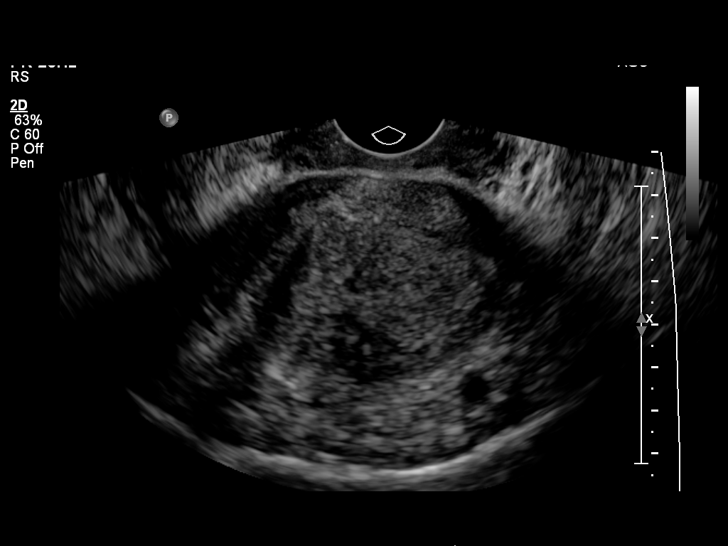
[im 16/30]
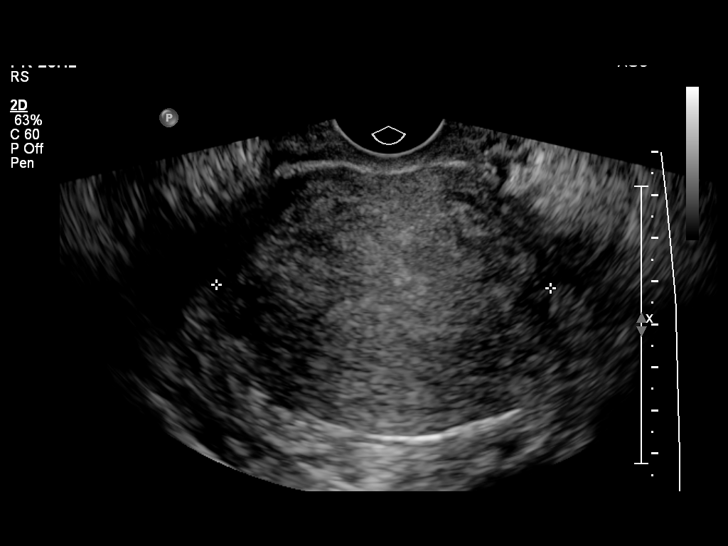
[im 19/30]
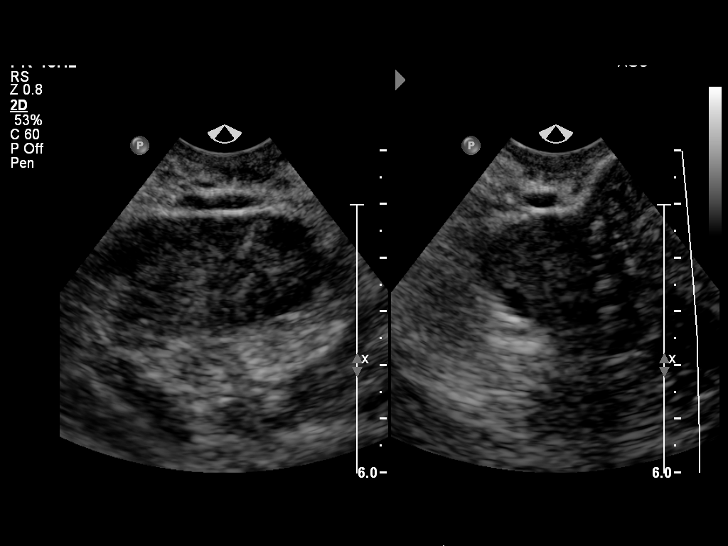
[im 20/30]
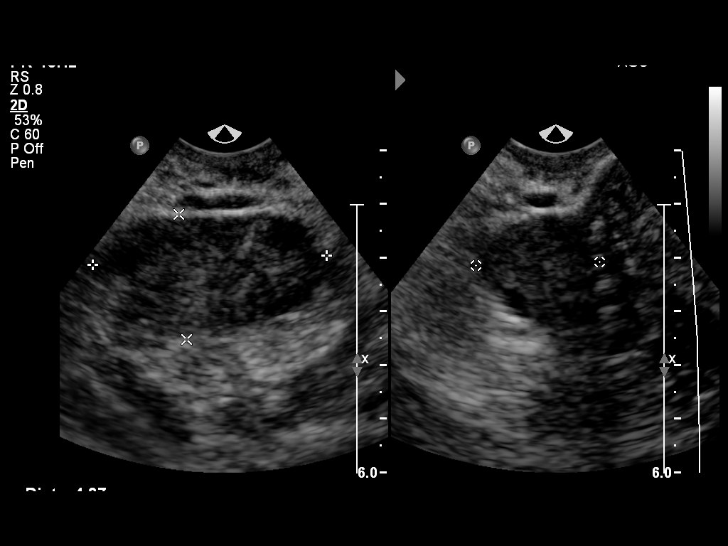
[im 22/30]
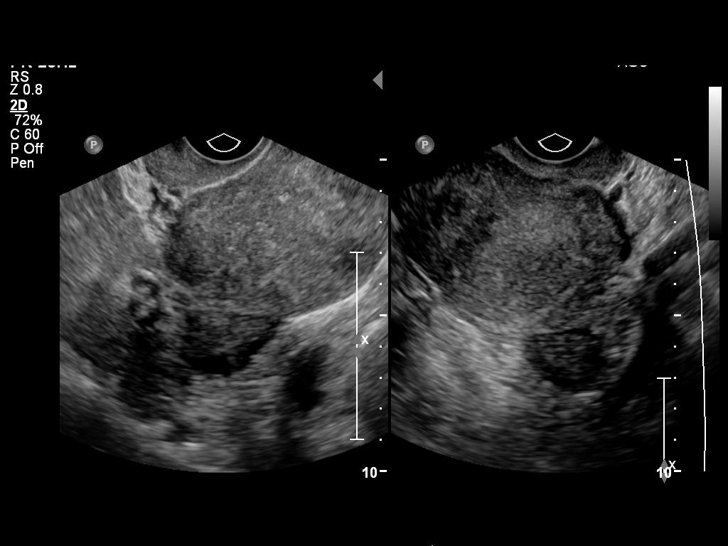
[im 25/30]
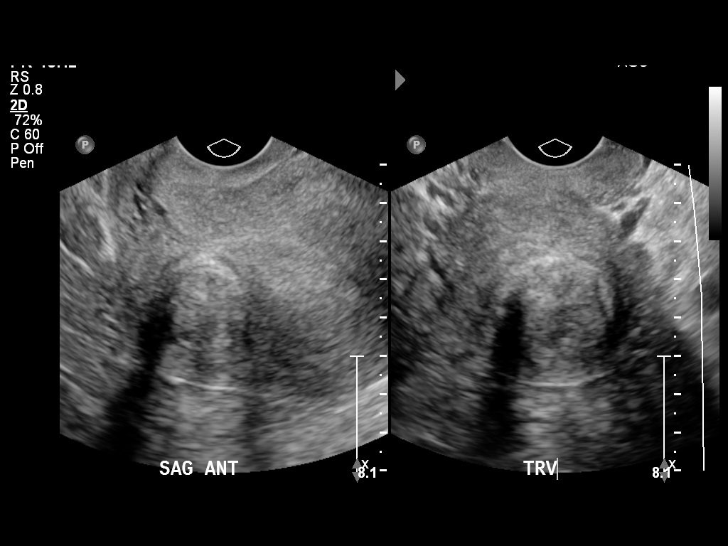
[im 27/30]
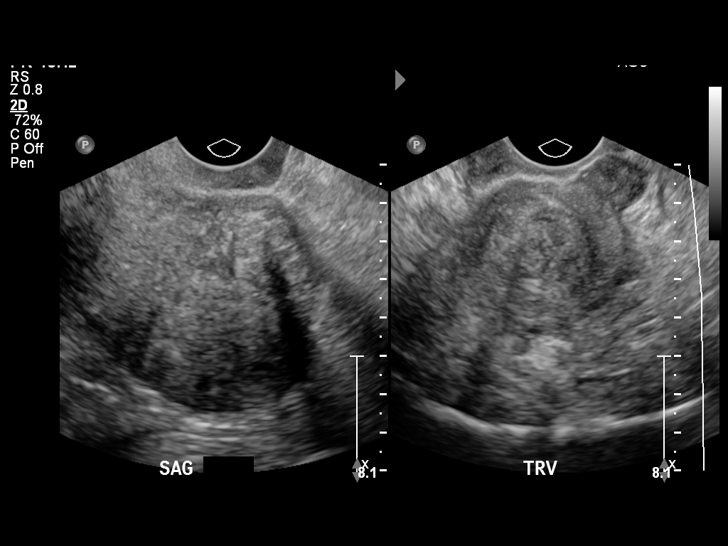
[im 30/30]
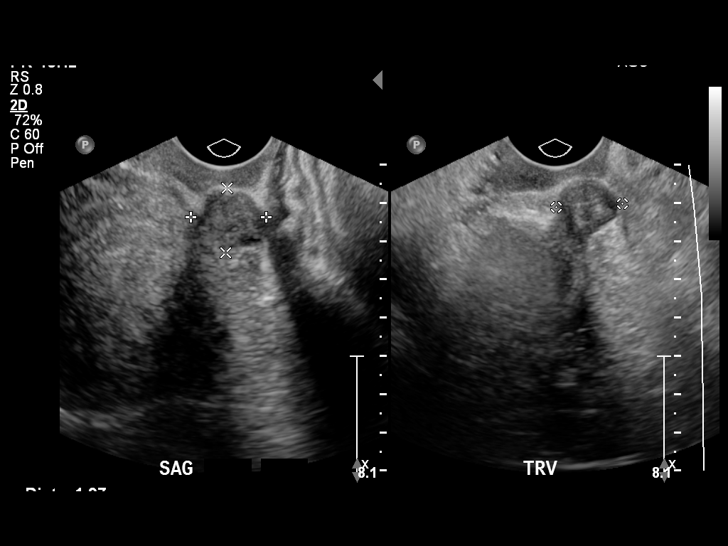

[14 of 25 positions shown; findings below may reference images not displayed]

FINDINGS: Uterus

Measurements: 9.8 x 6.5 x 7.8 cm. Retroverted. 2.8 x 3.5 x 3.6 cm
subserosal anterior uterine body fibroid. 2.6 x 2.7 x 3.3 cm
subserosal posterior fundal fibroid. 2.0 x 1.7 x 1.7 cm pedunculated
left posterior fundal fibroid.

Endometrium

Thickness: 3 mm.  No focal abnormality visualized.

Right ovary

Measurements: 4.4 x 2.3 x 2.3 cm. Normal appearance/no adnexal mass.

Left ovary

Measurements: 3.4 x 2.5 x 2.7 cm. Normal appearance/no adnexal mass.

Other findings

No free fluid.
IMPRESSION: Multiple uterine fibroids, measuring up to 3.6 cm as described
above.

Endometrial complex measures 3 mm, within normal limits.

## 2014-03-11 IMAGING — US US ABDOMEN COMPLETE
1 series · 14 of 25 positions shown · non-contrast
Comparison: None.

CLINICAL DATA: Right upper quadrant pain x2 years, bloating

EXAM:
ULTRASOUND ABDOMEN COMPLETE

[Series 1: us abdomen complete · 14 of 84 slices shown]
[im 1/84]
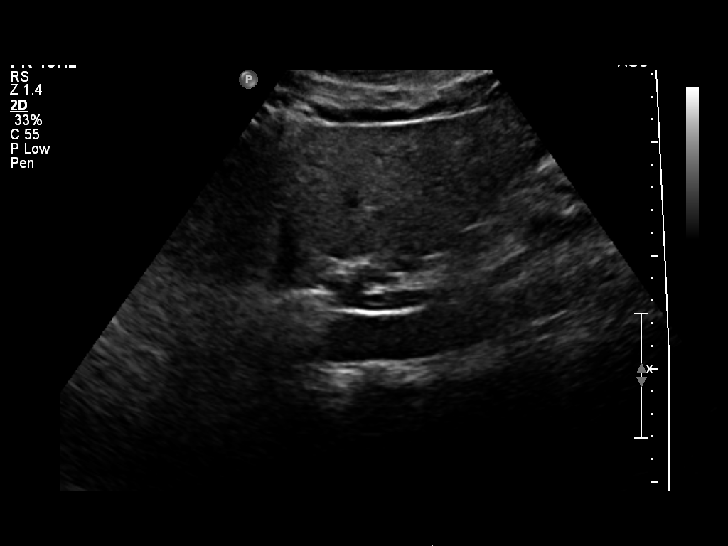
[im 7/84]
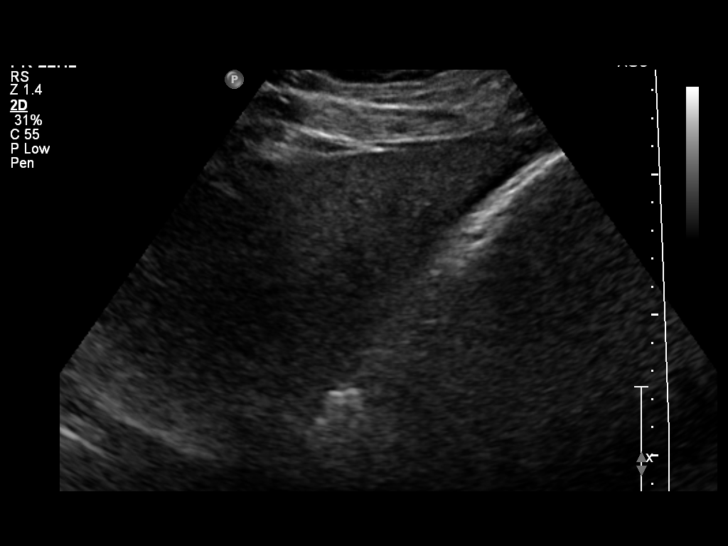
[im 14/84]
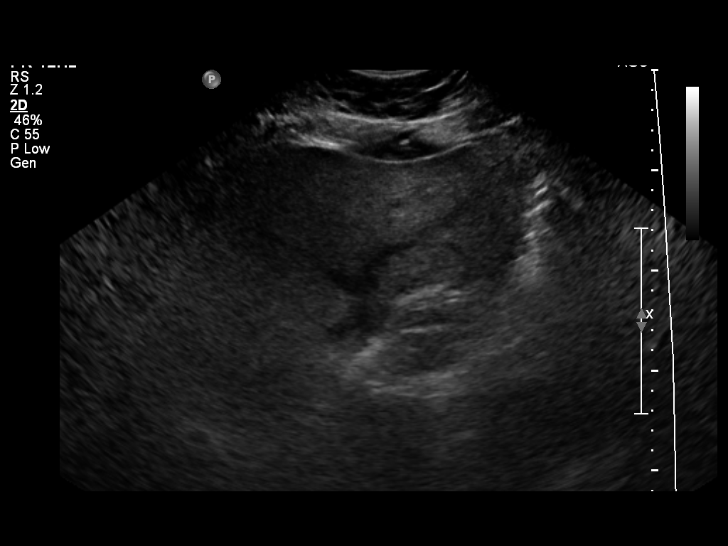
[im 21/84]
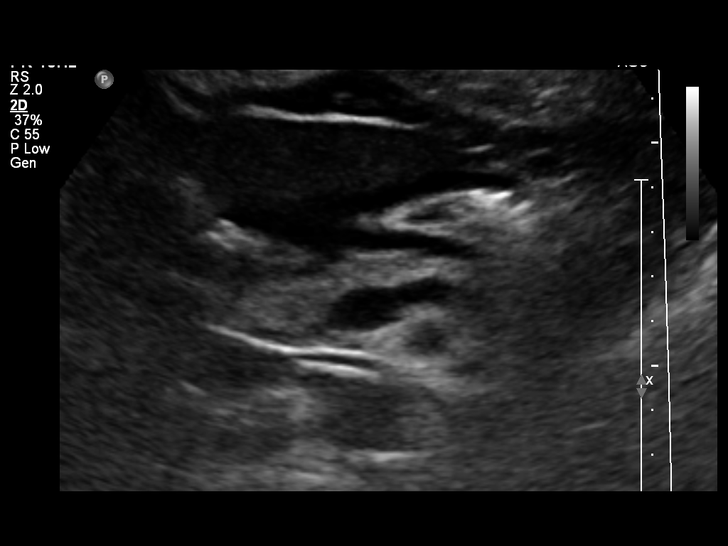
[im 28/84]
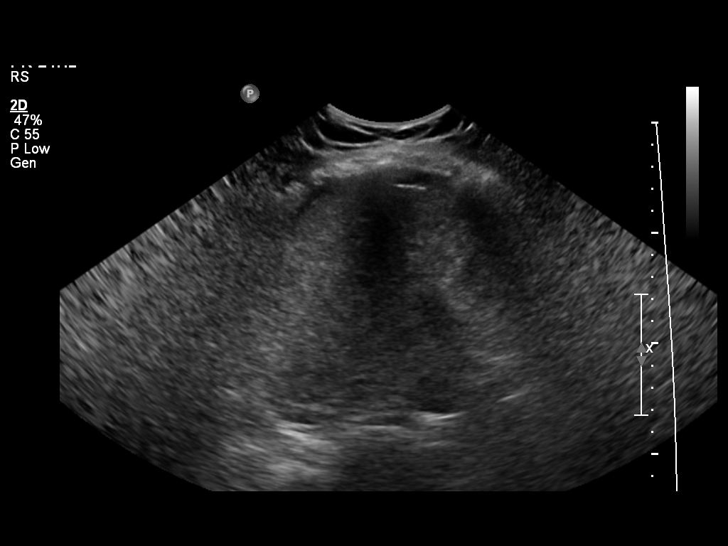
[im 32/84]
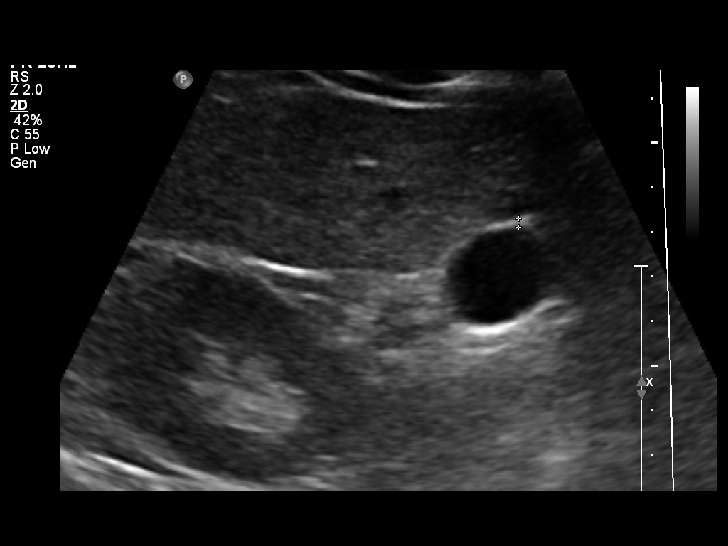
[im 39/84]
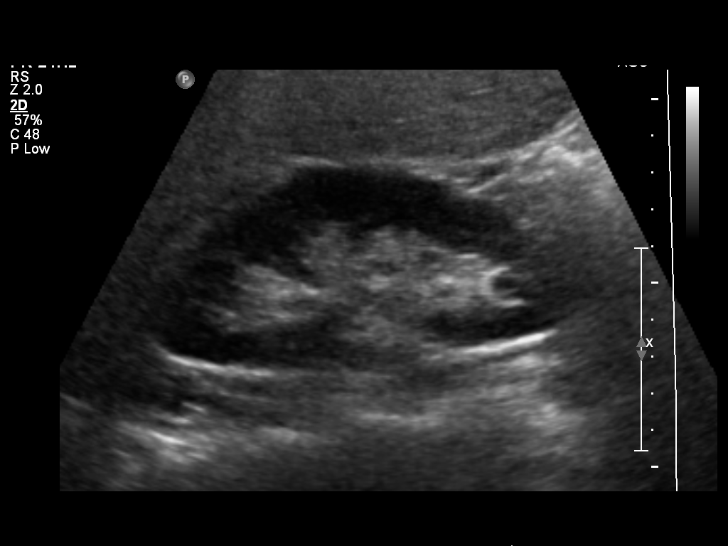
[im 45/84]
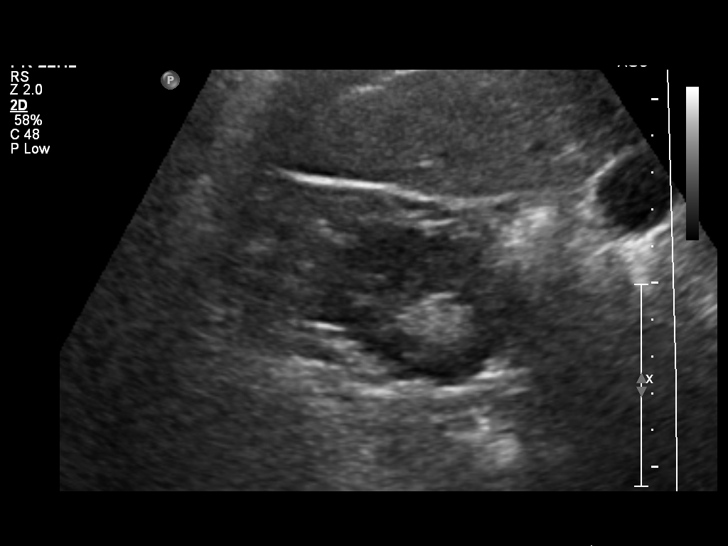
[im 52/84]
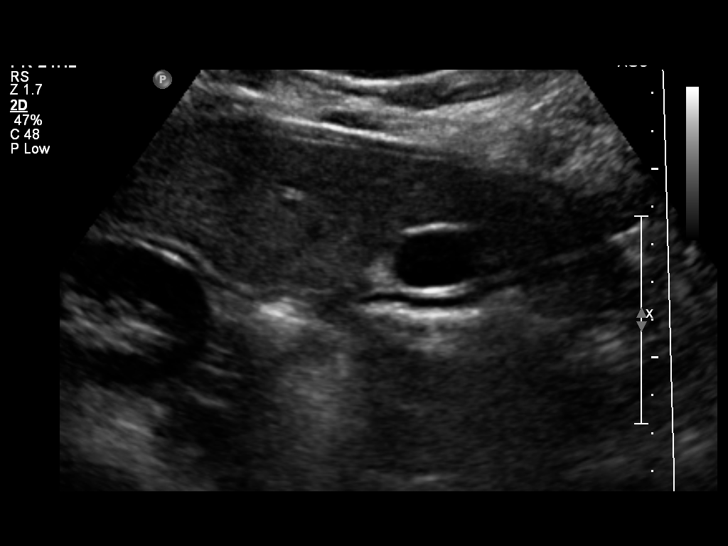
[im 56/84]
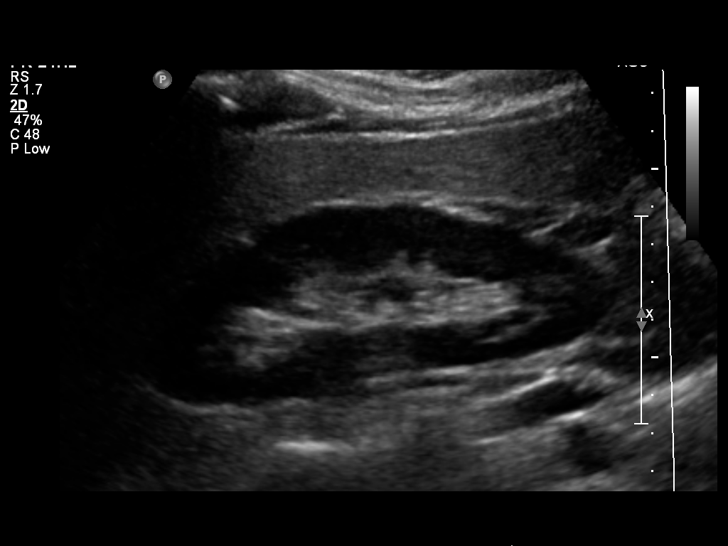
[im 63/84]
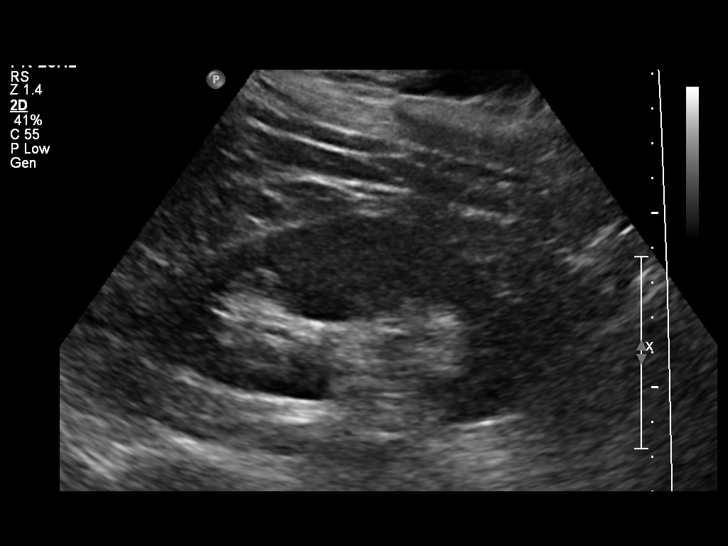
[im 70/84]
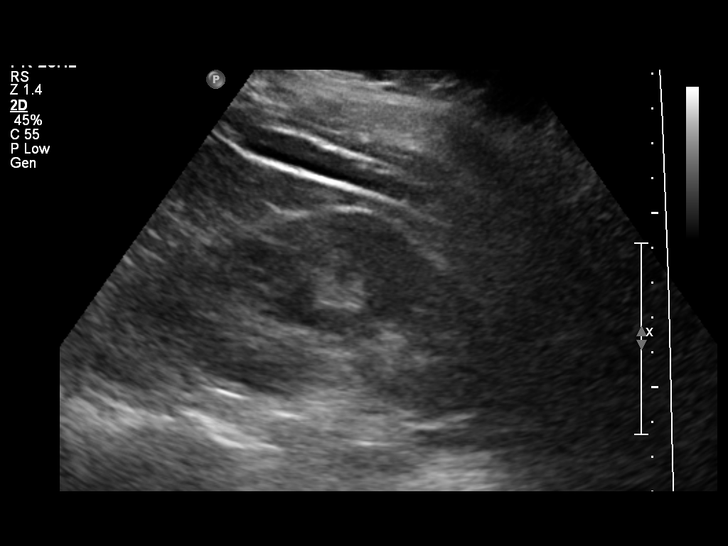
[im 77/84]
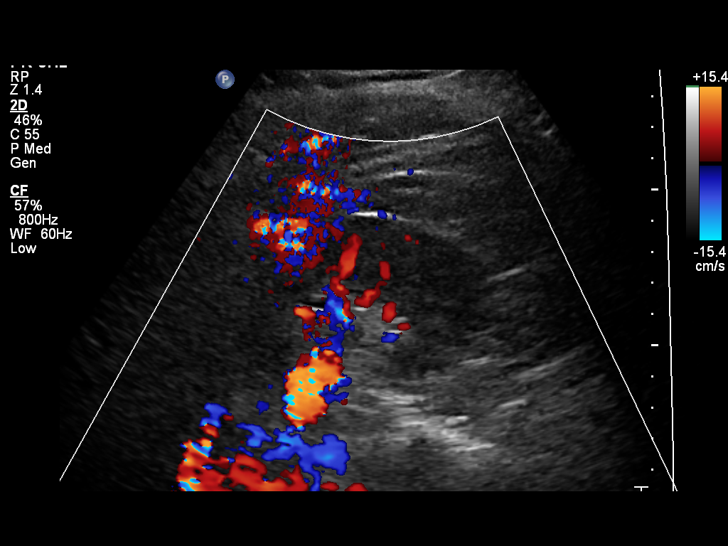
[im 84/84]
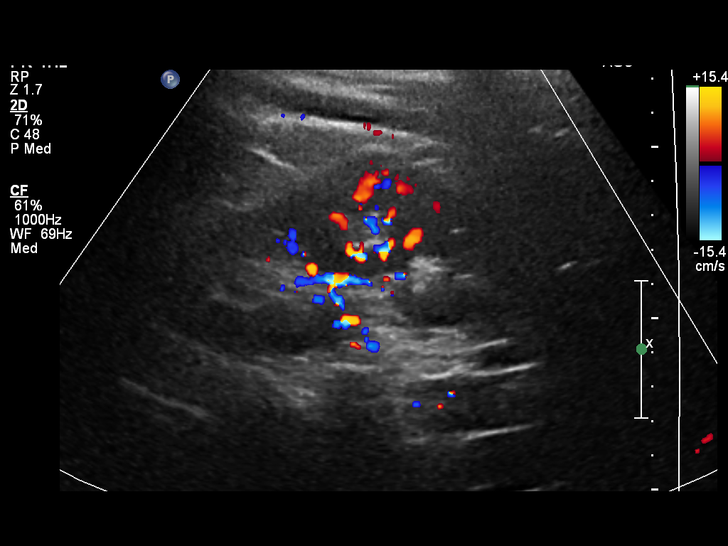

[14 of 25 positions shown; findings below may reference images not displayed]

FINDINGS: Gallbladder: No gallstones, gallbladder wall thickening, or
pericholecystic fluid. Negative sonographic Murphy's sign.

Common bile duct: Diameter: 6 mm

Liver: Hyperechoic hepatic parenchyma, suggesting hepatic steatosis.
No focal hepatic lesion is seen.

IVC: No abnormality visualized.

Pancreas: Incompletely visualized but grossly unremarkable.

Spleen: Size and appearance within normal limits.

Right Kidney: Length: 12.7 cm.  No mass or hydronephrosis.

Left Kidney: Length: 11.3 cm. Suspected prominent dromedary hump in
the interpolar left kidney rather than discrete mass. No mass or
hydronephrosis.

Abdominal aorta: No aneurysm visualized.

Other findings: None.
IMPRESSION: Suspected hepatic steatosis.

Prominent contour along the interpolar left kidney is favored to
reflect a dromedary hump rather than a discrete mass. Consider MRI
abdomen with/without contrast for confirmation.

Otherwise negative abdominal ultrasound.

## 2014-03-11 NOTE — Telephone Encounter (Signed)
Called to relay answer about her ultrasounds.  I will need to talk about the abdominal US with her myself (please tell her it's nothing bad but just needs an explanation from me).    Her pelvic US showed fibroids.  This is likely the cause of her bleeding and carmping.  If she would like, I can refer her to a GYN to discuss surgical options.   I'll be back on Tuesday if she has any questions.  Please call and let her know about the pelvic ultrasound.

## 2014-03-11 NOTE — Assessment & Plan Note (Signed)
Long-standing.   Has never been told she's had gallstones in past -- but this is what it sounds like. Discussed this with her. LFTs today plus RUQ U/S to assess for stones.   Surgery pending results.

## 2014-03-11 NOTE — Assessment & Plan Note (Signed)
With abodminal cramping. High dose ibuprofen for short term to help with both bleeding and cramping. Sending her for [place].  Will call with results.  Likely worsened by obesity.

## 2014-03-11 NOTE — Assessment & Plan Note (Signed)
A1C is slowly creeping upwards. Also with hypoglycemic events in the AM. Daughter is again going to try to help with her diet. STop glipizide due to hypoglycemia.   Need to make changes to Lantus -- but doesn't have her meter.  I'm not sure her understanding of DM2.  Think she would benefit from referral to Pharmacy clinic.

## 2014-03-12 ENCOUNTER — Telehealth: Payer: Self-pay | Admitting: Family Medicine

## 2014-03-12 NOTE — Telephone Encounter (Signed)
Pt says she is spotting again and ibuprofen is not helping

## 2014-03-12 NOTE — Telephone Encounter (Signed)
Spoke with patient and informed her of below. She will like you to call her when you get back into office.

## 2014-03-17 ENCOUNTER — Other Ambulatory Visit: Payer: Self-pay | Admitting: Family Medicine

## 2014-03-17 ENCOUNTER — Telehealth: Payer: Self-pay | Admitting: Family Medicine

## 2014-03-17 NOTE — Telephone Encounter (Signed)
Pt is calling and would like to know what her test results are. jw

## 2014-03-18 ENCOUNTER — Encounter: Payer: Self-pay | Admitting: Pharmacist

## 2014-03-18 ENCOUNTER — Ambulatory Visit (INDEPENDENT_AMBULATORY_CARE_PROVIDER_SITE_OTHER): Payer: No Typology Code available for payment source | Admitting: Pharmacist

## 2014-03-18 VITALS — BP 115/81 | HR 130 | Ht 69.0 in | Wt 251.0 lb

## 2014-03-18 DIAGNOSIS — Z0000 Encounter for general adult medical examination without abnormal findings: Secondary | ICD-10-CM

## 2014-03-18 DIAGNOSIS — IMO0002 Reserved for concepts with insufficient information to code with codable children: Secondary | ICD-10-CM

## 2014-03-18 DIAGNOSIS — E1165 Type 2 diabetes mellitus with hyperglycemia: Secondary | ICD-10-CM

## 2014-03-18 DIAGNOSIS — D259 Leiomyoma of uterus, unspecified: Secondary | ICD-10-CM

## 2014-03-18 NOTE — Progress Notes (Signed)
Patient ID: Alicia West, female   DOB: Aug 02, 1970, 44 y.o.   MRN: [id number] Reviewed: Agree with Dr. Graylin Shiver documentation and management.

## 2014-03-18 NOTE — Patient Instructions (Addendum)
It was nice to see you in clinic today.  - Stop taking your glipizide--this was causing your low blood sugars. - We are waiting to hear back from your insurance company for coverage for your injection. We will call you hopefully today or tomorrow to let you know what will be covered. - Hopefully you will be able to start the Victoza injection. Once you start this, cut back your Lantus to 40 units daily. - Check your blood sugar in the morning before you eat and 2 hours after a meal (either lunch or dinner). - Schedule a follow up pharmacy visit in the next 2-3 weeks.

## 2014-03-18 NOTE — Assessment & Plan Note (Signed)
Diabetes diagnosed in 2006 currently uncontrolled with most recent A1c of 10.9 in February 2016 (up from 9.3 in April 2015) despite adherence to Lantus 60u daily and metformin 521m BID. Patient reports multiple hypoglycemic events in the past few weeks. She was told last week to stop taking her glipizide as this was likely a major cause of her hypoglycemia. However, patient continued to take glipizide until yesterday. Reinforced that she should stop taking her glipizide and discussed appropriate hypoglycemia management plan. Patient does not want to increase her metformin due to previous GI intolerance at higher dose. Medication plan during today's visit: stop taking glipizide, reduce Lantus to 40u daily, and add Victoza with the goal to titrate up from 0.616mdaily to 1.63m5maily after a week. However, Victoza requires a prior auth--will follow up with patient in next day or two regarding coverage.

## 2014-03-18 NOTE — Telephone Encounter (Signed)
Discussed with her today at Pharmacy clinic.  Please see my documentation under Pharm note

## 2014-03-18 NOTE — Progress Notes (Signed)
As patient was here for Pharm clinic, I was able to meet with her in person.  Explained lab results and uterine fibroids, which patient had been told by nurse about.  Also explained about fatty liver.  Possibly contributing to RUQ pain.  No gallstones noted.    She would like information about potential surgical options for her fibroids.  Will refer to GYN today.  Desires [facility].  Also wants mammogram referral.

## 2014-03-18 NOTE — Progress Notes (Signed)
S:    Patient arrives to clinic with her daughter for diabetes management. She reports having a history of diabetes for the past 8-10 years, 5 of which she has required insulin.  Patient reports adherence with medications. Current diabetes medications include: metformin 531m BID, Lantus 60u daily. Patient reports 3 hypoglycemic events in the past week. She was told to stop taking her glipizide last week but continued taking it until yesterday.  She has been sick and hasn't been eating as much recently. Fasting CBGs averaging 190s-low 200s. She reports polyuria and polydipsia. When she goes to the bathroom during the night, she has felt low a few times and reports readings 40s-50s at this time. She will usually eat peanut butter crackers or cookies when this happens. She has also reported a few lows around 4-5pm when she's at work. A few days ago, she had a low of 79 when she was sick; she had not eaten all day and did not take her DM medications that day either. Her ideal goal weight is 160-180 pounds.  Patient reported dietary habits: 24 hour recall (has been sick): No breakfast. 2 white castle burgers for lunch, cup of plain applesauce. A few girl scout cookies. Drank water. Usually eats 2 meals a day and a snack. Has been making smoothies with fruit, sugar free yogurt, and sugar free flavoring.   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 No other exercise.  O:  . Lab Results  Component Value Date   HGBA1C 10.9 03/09/2014    A/P: Diabetes diagnosed in 2006 currently uncontrolled with most recent A1c of 10.9 in February 2016 (up from 9.3 in April 2015) despite adherence to Lantus 60u daily and metformin 50379mBID. Patient reports multiple hypoglycemic events in the past few weeks. She was told last week to stop taking her glipizide as this was likely a major cause of her hypoglycemia. However, patient continued to take glipizide until yesterday.  Reinforced that she should stop taking her glipizide and discussed appropriate hypoglycemia management plan. Patient does not want to increase her metformin due to previous GI intolerance at higher dose. Medication plan during today's visit: stop taking glipizide, reduce Lantus to 40u daily, and add Victoza with the goal to titrate up from 0.79m69maily to 1.2mg879mily after a week. However, Victoza requires a prior auth--will follow up with patient in next day or two regarding coverage.   Next A1C anticipated May 2016.  Written patient instructions provided.  Follow up in Pharmacist Clinic Visit 2-3 weeks.   Total time in face to face counseling 40 minutes.  [facility] seen with [facility].

## 2014-03-18 NOTE — Addendum Note (Signed)
Addended byMingo Amber, Kayleen Memos on: 03/18/2014 02:52 PM   Modules accepted: Orders

## 2014-03-19 ENCOUNTER — Telehealth: Payer: Self-pay | Admitting: Pharmacist

## 2014-03-19 NOTE — Telephone Encounter (Signed)
Contacted to inform that the prior auth form had been completed and returned.  She could call and check if pharmacy had available.

## 2014-03-23 ENCOUNTER — Encounter: Payer: Self-pay | Admitting: Family Medicine

## 2014-03-24 ENCOUNTER — Telehealth: Payer: Self-pay | Admitting: Family Medicine

## 2014-03-24 DIAGNOSIS — N644 Mastodynia: Secondary | ICD-10-CM

## 2014-03-24 NOTE — Telephone Encounter (Signed)
Pt called because she would like the doctor to change her BP medication Lisinopril to something else since she thinks that it is causing her to cough all day. jw

## 2014-03-24 NOTE — Telephone Encounter (Signed)
Left voice message on [facility] line stating Victoza Pen (3 pens/30 days) was approved for three years 03/23/2014-03/23/2017.  Reference number: [id number].  Derl Barrow, RN

## 2014-03-25 NOTE — Telephone Encounter (Signed)
-----   Message from Alicia West to Alveda Reasons, MD sent at 03/23/2014 4:33 PM -----     I have a question about CBC resulted on 03/09/14 at 10:49 PM. Why are my blood platelets high.

## 2014-03-25 NOTE — Telephone Encounter (Signed)
Let's try her with only the amlodipine.  Her blood pressures have been good the past several visits.  She should stop the Lisinopril and follow up in 2-3 weeks for a nurse visit for a blood pressure recheck.

## 2014-03-26 NOTE — Telephone Encounter (Signed)
Called back about BP med. Doesn't want to go thru the weekend with out the medication

## 2014-04-05 ENCOUNTER — Telehealth: Payer: Self-pay | Admitting: *Deleted

## 2014-04-05 NOTE — Telephone Encounter (Signed)
Received a fax from [facility] stating pt need Pen needles for Victoza: Novofine 32 G.  Derl Barrow, RN

## 2014-04-06 MED ORDER — INSULIN PEN NEEDLE 32G X 6 MM MISC: Status: DC

## 2014-04-06 NOTE — Telephone Encounter (Signed)
Completed.

## 2014-04-08 ENCOUNTER — Ambulatory Visit: Payer: No Typology Code available for payment source | Admitting: Pharmacist

## 2014-04-09 ENCOUNTER — Encounter: Payer: Self-pay | Admitting: Family Medicine

## 2014-04-09 ENCOUNTER — Other Ambulatory Visit: Payer: Self-pay | Admitting: Family Medicine

## 2014-04-09 DIAGNOSIS — Z1231 Encounter for screening mammogram for malignant neoplasm of breast: Secondary | ICD-10-CM

## 2014-04-09 NOTE — Progress Notes (Signed)
Pamala Hurry at [facility] is needing records concerning reason patient was referred to their practice.  Please fax to [phone]

## 2014-04-12 NOTE — Progress Notes (Signed)
Faxed OV and imaging notes out

## 2014-04-16 NOTE — Telephone Encounter (Signed)
Ms. Alicia West spoke to [place] regarding an appt for screening mammogram. Asked about what she was having done and she mentioned about having soreness and round bump underneath.  They need the order to be changed from screening mammogram to diagnostic mammo.  Please send in new order and inform patient.

## 2014-04-16 NOTE — Telephone Encounter (Signed)
I have put in the order. Please call and inform the patient.    Do we need to make an appt for her at the [facility], or can she just go on her own now?

## 2014-04-20 ENCOUNTER — Other Ambulatory Visit: Payer: Self-pay | Admitting: Family Medicine

## 2014-05-03 ENCOUNTER — Other Ambulatory Visit: Payer: Self-pay | Admitting: Family Medicine

## 2014-05-03 ENCOUNTER — Telehealth: Payer: Self-pay | Admitting: Family Medicine

## 2014-05-03 DIAGNOSIS — N644 Mastodynia: Secondary | ICD-10-CM

## 2014-05-03 NOTE — Telephone Encounter (Signed)
Pt is aware that order has been placed.  Number given to her to call and schedule this at her convenience.  Adell Panek,CMA

## 2014-05-03 NOTE — Telephone Encounter (Signed)
Was suppose to receive mammogram-was diagnostic rather than screening Would like status--second time pt has called

## 2014-05-06 ENCOUNTER — Ambulatory Visit
Admission: RE | Admit: 2014-05-06 | Discharge: 2014-05-06 | Disposition: A | Discharge status: Home / Self Care | Payer: No Typology Code available for payment source | Source: Ambulatory Visit | Attending: Family Medicine | Admitting: Family Medicine

## 2014-05-06 ENCOUNTER — Ambulatory Visit
Admission: RE | Admit: 2014-05-06 | Discharge: 2014-05-06 | Disposition: A | Discharge status: Home / Self Care | Payer: No Typology Code available for payment source | Source: Ambulatory Visit

## 2014-05-06 DIAGNOSIS — N644 Mastodynia: Secondary | ICD-10-CM

## 2014-08-25 ENCOUNTER — Telehealth: Payer: Self-pay | Admitting: *Deleted

## 2014-08-25 NOTE — Telephone Encounter (Signed)
Patient called this morning request a chest x-ray due to a positive PPD in 2009.  Pt advised that she should be following up with [facility] for TB Screening.  Patient stated she went to the health department for the screening and it was negative for TB symptoms.  Patient new employer is stating she will need a PPD placed or have a chest x ray completed.  Patient advised once she had positive PPD, which her PPD was positive 09/05/2009 and most recent chest x-ray 01/17/2011 was also negative; she would only need another chest x-ray if her TB screening was positive for symptoms.  She will not get another PPD placed; once a PPD is positive they would always be positive.    If a patient has a positive PPD, they would be referred to [facility] Department for further evaluation.  Patients would be offered to start TB treatment and have a chest x-ray at no cost to them.  Patients can also declined to have treatment started.  They would need to follow up at the Health Department for any other TB screenings.  If a patient complains of TB symptoms, at that time the TB nurses at the Health Department would order the chest x-ray.  The Health Department TB can be contacted at [phone] to speak with the TB Specialist.    Patient will be given a copy of last chest x-ray form 2012 and copy of last PPD result to give to her employer.  Patient should also give her employer a copy of the TB screening that was completed at the Health Department.  If her employer has questions regarding the process that patients has to take they can call the clinic nurse at [facility] [phone] or contact [facility] Department at [phone].    Precept with Dr. Ree Kida; agree with plan.   Derl Barrow, RN

## 2014-09-03 ENCOUNTER — Other Ambulatory Visit: Payer: Self-pay | Admitting: Family Medicine

## 2014-09-03 NOTE — Telephone Encounter (Signed)
Pharmacy calling to request refill of Lantus insulin since patient is out of med.  Refilled med x 1 month and pharmacy will inform patient to make follow-up appt for additional refills.  Will route Victoza refill request to Dr. Ardelia Mems.    Burna Forts, BSN, RN-BC

## 2014-09-06 ENCOUNTER — Telehealth: Payer: Self-pay | Admitting: Family Medicine

## 2014-09-06 NOTE — Telephone Encounter (Signed)
Covering for [name] who is away.  Received request for refill of both lantus and victoza. Does not appear we have ever sent in [place] (although it may have been called in without documentation on med list.).   Please call patient to confirm she is taking victoza, and to ask what dose of victoza she is taking. Also please remind her that she is well past due for an appointment. Last seen here 6 months ago.  Once I know her dose I will be happy to send in a refill for her. Leeanne Rio, MD

## 2014-09-08 NOTE — Telephone Encounter (Signed)
Spoke to pt. Set appt for 09/28/14. Victoza has been refilled by another provider. Ottis Stain, CMA

## 2014-09-18 ENCOUNTER — Other Ambulatory Visit: Payer: Self-pay | Admitting: Family Medicine

## 2014-09-20 ENCOUNTER — Telehealth: Payer: Self-pay | Admitting: Family Medicine

## 2014-09-20 MED ORDER — INSULIN PEN NEEDLE 32G X 5 MM MISC: Status: DC

## 2014-09-20 MED ORDER — INSULIN PEN NEEDLE 31G X 8 MM MISC: Status: DC

## 2014-09-20 NOTE — Telephone Encounter (Signed)
Fax received from [facility] asking for 2 separate pen needles. Patient has coupons.  Upcoming appt on 8/23.  Will send in as requested and discuss at visit.

## 2014-09-27 ENCOUNTER — Telehealth: Payer: Self-pay | Admitting: Family Medicine

## 2014-09-27 MED ORDER — INSULIN GLARGINE 100 UNIT/ML SOLOSTAR PEN: 40.0000 [IU] | PEN_INJECTOR | Freq: Every day | SUBCUTANEOUS | Status: DC

## 2014-09-27 NOTE — Telephone Encounter (Signed)
Refilled

## 2014-09-27 NOTE — Telephone Encounter (Signed)
Need her insulin rxs refilled until her next appt on 9/20.  Had to resched due to new job starting tomorrow.  Couldn't miss unless she would not get the job.

## 2014-09-28 ENCOUNTER — Ambulatory Visit: Payer: No Typology Code available for payment source | Admitting: Family Medicine

## 2014-10-03 ENCOUNTER — Other Ambulatory Visit: Payer: Self-pay | Admitting: Family Medicine

## 2014-10-26 ENCOUNTER — Ambulatory Visit (INDEPENDENT_AMBULATORY_CARE_PROVIDER_SITE_OTHER): Payer: No Typology Code available for payment source | Admitting: Family Medicine

## 2014-10-26 ENCOUNTER — Ambulatory Visit (HOSPITAL_COMMUNITY)
Admission: RE | Admit: 2014-10-26 | Discharge: 2014-10-26 | Disposition: A | Discharge status: Home / Self Care | Payer: No Typology Code available for payment source | Source: Ambulatory Visit | Attending: Family Medicine | Admitting: Family Medicine

## 2014-10-26 VITALS — BP 186/113 | HR 102 | Temp 99.1°F | Ht 69.0 in | Wt 238.1 lb

## 2014-10-26 DIAGNOSIS — E1165 Type 2 diabetes mellitus with hyperglycemia: Secondary | ICD-10-CM

## 2014-10-26 DIAGNOSIS — I471 Supraventricular tachycardia, unspecified: Secondary | ICD-10-CM

## 2014-10-26 DIAGNOSIS — R9431 Abnormal electrocardiogram [ECG] [EKG]: Secondary | ICD-10-CM | POA: Insufficient documentation

## 2014-10-26 DIAGNOSIS — E669 Obesity, unspecified: Secondary | ICD-10-CM | POA: Diagnosis not present

## 2014-10-26 DIAGNOSIS — IMO0002 Reserved for concepts with insufficient information to code with codable children: Secondary | ICD-10-CM

## 2014-10-26 DIAGNOSIS — F419 Anxiety disorder, unspecified: Secondary | ICD-10-CM

## 2014-10-26 DIAGNOSIS — I10 Essential (primary) hypertension: Secondary | ICD-10-CM

## 2014-10-26 HISTORY — DX: Supraventricular tachycardia: I47.1

## 2014-10-26 HISTORY — DX: Supraventricular tachycardia, unspecified: I47.10

## 2014-10-26 LAB — CBC
HCT: 38 % (ref 36.0–46.0)
HEMOGLOBIN: 12.7 g/dL (ref 12.0–15.0)
MCH: 25.8 pg — AB (ref 26.0–34.0)
MCHC: 33.4 g/dL (ref 30.0–36.0)
MCV: 77.1 fL — ABNORMAL LOW (ref 78.0–100.0)
MPV: 8.8 fL (ref 8.6–12.4)
Platelets: 431 10*3/uL — ABNORMAL HIGH (ref 150–400)
RBC: 4.93 MIL/uL (ref 3.87–5.11)
RDW: 16.9 % — ABNORMAL HIGH (ref 11.5–15.5)
WBC: 9.3 10*3/uL (ref 4.0–10.5)

## 2014-10-26 LAB — COMPREHENSIVE METABOLIC PANEL
ALK PHOS: 128 U/L — AB (ref 33–115)
ALT: 14 U/L (ref 6–29)
AST: 22 U/L (ref 10–30)
Albumin: 4.2 g/dL (ref 3.6–5.1)
BUN: 9 mg/dL (ref 7–25)
CALCIUM: 9.6 mg/dL (ref 8.6–10.2)
CHLORIDE: 99 mmol/L (ref 98–110)
CO2: 25 mmol/L (ref 20–31)
Creat: 0.75 mg/dL (ref 0.50–1.10)
GLUCOSE: 196 mg/dL — AB (ref 65–99)
POTASSIUM: 4.2 mmol/L (ref 3.5–5.3)
Sodium: 137 mmol/L (ref 135–146)
Total Bilirubin: 0.7 mg/dL (ref 0.2–1.2)
Total Protein: 8 g/dL (ref 6.1–8.1)

## 2014-10-26 LAB — TSH: TSH: 2.922 u[IU]/mL (ref 0.350–4.500)

## 2014-10-26 LAB — POCT GLYCOSYLATED HEMOGLOBIN (HGB A1C): Hemoglobin A1C: 9.7

## 2014-10-26 NOTE — Assessment & Plan Note (Signed)
EKG shows undetermined septal infarct, which was present on prior EKG in 2014.   Rhythm strip shows regular rhythm for few seconds followed by accelerated, irregular rhythm. Plan referral to cardiology to evaluate this plus palpitations and occasional episodes of chest pain for further recommendations.

## 2014-10-26 NOTE — Patient Instructions (Addendum)
I have referred you to a podiatrist for your feet.  You should hear from them in the next 1-2 weeks.  Stop the metformin for about a week.  Restart it just one pill a day after that.  See if you can tolerate it just once a day.  Come back to see me in 2 weeks or so to recheck your blood pressure.    Your A1C has dropped a whole point to 9.7 from 10.9.  This is a great improvement from last time.  We still have a way to go.    I am sending you to a cardiologist for your irregular heart rate.  They'll call you with an appointment.

## 2014-10-26 NOTE — Progress Notes (Signed)
Subjective:    Alicia West is a 44 y.o. female who presents to [facility] today for several issues:  1. Diabetes:  Currently on Victoza, Lantus 40 units daily, Metformin 500 mg BID.  Initially had nausea with Victoza, but better after time  No adverse effects from medication.  No hypoglycemic events.  No paresthesia or peripheral nerve pain.  Measures blood sugars at home every:  Averaging 200s.  Jumped up to 300-400 range when off her medicines.  See below regarding diarrhea.  She would like to see a podiatrist for her feet.      Lab Results  Component Value Date   HGBA1C 10.9 03/09/2014     2.  Hypertension:  On Lisinopril-HCTZ combination.  Hasn't taken today.  Prior BP's have been good.  Long-term problem for this patient.  No adverse effects from medication.  Not checking it regularly.  No HA, CP, dizziness, shortness of breath, or LE swelling.  Has started having more frequent palpitations, at rest and with exertion.  Recently had several episodes of dyspnea with exertion without chest pain.  States she's had increased stress at home with nephew moving in for short term but hasn't moved out yet despite previous promise to do so.  BP Readings from Last 3 Encounters:  10/26/14 186/113  03/18/14 115/81  03/09/14 139/91   3.  Obesity:  She is cutting back on her calories.  Feels she's lost weight, but gained some back.  Was out of her medicine for at least 2 weeks if not longer.  Restarted her medicine, states that she's had bad diarrhea since restarting the medicine.  Not sure if secondary to Metformin use but she thinks it is.  4.  Stresses at home:  Brother is living with her, and has so for past 2 months.  Was supposed to have moved out by now.  Has tried deep breathing for relief.   The following portions of the patient's history were reviewed and updated as appropriate: allergies, current medications, past medical history, family and social history, and problem list. Patient is a  nonsmoker.    PMH reviewed.  Past Medical History  Diagnosis Date  . Diabetes mellitus   . Hypertension   . Herpes simplex labialis   . TB (tuberculosis) contact     + skin test,/- chest x-ray   No past surgical history on file.  Medications reviewed. Current Outpatient Prescriptions  Medication Sig Dispense Refill  . amLODipine (NORVASC) 5 MG tablet TAKE 1 TABLET BY MOUTH DAILY 30 tablet 1  . atorvastatin (LIPITOR) 40 MG tablet TAKE 1 TABLET BY MOUTH DAILY 30 tablet 9  . cetirizine (ZYRTEC) 10 MG tablet Take 10 mg by mouth daily.    Marland Kitchen ibuprofen (ADVIL,MOTRIN) 800 MG tablet TAKE 1 TABLET BY MOUTH EVERY 8 HOURS AS NEEDED 30 tablet 1  . Insulin Glargine (LANTUS SOLOSTAR) 100 UNIT/ML Solostar Pen Inject 40 Units into the skin daily. 15 mL 3  . Insulin Pen Needle (NOVOTWIST) 32G X 5 MM MISC Use along with insulin injections up to 4 x daily 100 each 6  . Insulin Pen Needle (ULTICARE MICRO PEN NEEDLES) 31G X 8 MM MISC Use as directed up to 4 times daily 100 each 6  . lisinopril-hydrochlorothiazide (PRINZIDE,ZESTORETIC) 20-25 MG per tablet TAKE 1 TABLET BY MOUTH DAILY 30 tablet 3  . metFORMIN (GLUCOPHAGE) 500 MG tablet Take 500 mg by mouth 2 (two) times daily with a meal.    . mometasone (NASONEX) 50 MCG/ACT  nasal spray Place 2 sprays into the nose daily.     No current facility-administered medications for this visit.     Objective:   Physical Exam BP 186/113 mmHg  Pulse 102  Temp(Src) 99.1 F (37.3 C) (Oral)  Ht 5' 9"  (1.753 m)  Wt 238 lb 1.6 oz (108.001 kg)  BMI 35.15 kg/m2  LMP 09/26/2014 Gen:  Alert, cooperative patient who appears stated age in no acute distress.  Vital signs reviewed. HEENT: EOMI,  MMM Cardiac:  Regular rate and rhythm on my exam.   Pulm:  Clear to auscultation bilaterally   Abd:  Soft/nondistended/nontender. Obese  Exts: Minimal edema BL LE's  No results found for this or any previous visit (from the past 72 hour(s)).

## 2014-10-28 NOTE — Assessment & Plan Note (Signed)
Acutely worsened secondary to situational stressors at home. Discussed brief de-stressors.   FU in 2 weeks.

## 2014-10-28 NOTE — Assessment & Plan Note (Addendum)
Still not at goal -- but A1C has decreased since last visit despite being without her medications for a couple of weeks. Think this is a good combination for her.   Can restart Metformin in next week, just once daily.  If unable to tolerate will cease using this.   FU in 2 weeks and bring her glucometer at that meeting.

## 2014-10-28 NOTE — Assessment & Plan Note (Signed)
Not controlled today.  Better on recheck.  Didn't take her medications today, was also out of them for a few weeks.   Likely worsened by stress.  Restart meds today and FU in 1-2 weeks for BP recheck.

## 2014-10-28 NOTE — Assessment & Plan Note (Signed)
Counseled on healthy food choices today.  Awaiting cardiology appointment before further recommendations regarding exercise.

## 2014-11-05 ENCOUNTER — Encounter: Payer: Self-pay | Admitting: Family Medicine

## 2014-11-11 ENCOUNTER — Telehealth: Payer: Self-pay | Admitting: Internal Medicine

## 2014-11-11 ENCOUNTER — Ambulatory Visit: Payer: Self-pay | Admitting: Podiatry

## 2014-11-11 NOTE — Telephone Encounter (Signed)
Called pt and left message informing her that we needed to update her information on her family history and if she could give our office a call back or we could just get the information when she comes in next week for her appt.

## 2014-11-16 ENCOUNTER — Ambulatory Visit: Payer: Self-pay | Admitting: Podiatry

## 2014-11-17 ENCOUNTER — Ambulatory Visit: Payer: No Typology Code available for payment source | Admitting: Internal Medicine

## 2014-11-17 ENCOUNTER — Other Ambulatory Visit: Payer: Self-pay | Admitting: Family Medicine

## 2014-11-22 ENCOUNTER — Other Ambulatory Visit: Payer: Self-pay | Admitting: *Deleted

## 2014-11-22 NOTE — Telephone Encounter (Signed)
Patient needs all of these medications sent to [facility] on [address].  Also she would like to know if she can get a sample of victoza and lantus pen due to limited funds until her payday.  Will forward to MD.  Please route to [facility].  Jazmin Hartsell,CMA

## 2014-11-23 ENCOUNTER — Ambulatory Visit: Payer: Self-pay | Admitting: Podiatry

## 2014-11-23 MED ORDER — AMLODIPINE BESYLATE 5 MG PO TABS: 5.0000 mg | ORAL_TABLET | Freq: Every day | ORAL | Status: DC

## 2014-11-23 MED ORDER — INSULIN PEN NEEDLE 32G X 5 MM MISC: Status: DC

## 2014-11-23 MED ORDER — INSULIN GLARGINE 100 UNIT/ML SOLOSTAR PEN: 40.0000 [IU] | PEN_INJECTOR | Freq: Every day | SUBCUTANEOUS | Status: DC

## 2014-11-23 MED ORDER — ATORVASTATIN CALCIUM 40 MG PO TABS: 40.0000 mg | ORAL_TABLET | Freq: Every day | ORAL | Status: DC

## 2014-11-23 MED ORDER — METFORMIN HCL 500 MG PO TABS: 500.0000 mg | ORAL_TABLET | Freq: Two times a day (BID) | ORAL | Status: DC

## 2014-11-23 MED ORDER — INSULIN PEN NEEDLE 31G X 8 MM MISC: Status: DC

## 2014-11-30 ENCOUNTER — Encounter: Payer: Self-pay | Admitting: Family Medicine

## 2014-11-30 ENCOUNTER — Ambulatory Visit (INDEPENDENT_AMBULATORY_CARE_PROVIDER_SITE_OTHER): Payer: No Typology Code available for payment source | Admitting: Family Medicine

## 2014-11-30 VITALS — BP 138/98 | HR 111 | Temp 98.5°F | Ht 69.0 in | Wt 239.3 lb

## 2014-11-30 DIAGNOSIS — I10 Essential (primary) hypertension: Secondary | ICD-10-CM

## 2014-11-30 DIAGNOSIS — E118 Type 2 diabetes mellitus with unspecified complications: Secondary | ICD-10-CM

## 2014-11-30 DIAGNOSIS — Z794 Long term (current) use of insulin: Secondary | ICD-10-CM

## 2014-11-30 DIAGNOSIS — I471 Supraventricular tachycardia, unspecified: Secondary | ICD-10-CM

## 2014-11-30 DIAGNOSIS — IMO0002 Reserved for concepts with insufficient information to code with codable children: Secondary | ICD-10-CM

## 2014-11-30 DIAGNOSIS — H3530 Unspecified macular degeneration: Secondary | ICD-10-CM | POA: Diagnosis not present

## 2014-11-30 DIAGNOSIS — E1165 Type 2 diabetes mellitus with hyperglycemia: Secondary | ICD-10-CM

## 2014-11-30 MED ORDER — INSULIN GLARGINE 100 UNIT/ML SOLOSTAR PEN: 40.0000 [IU] | PEN_INJECTOR | Freq: Every day | SUBCUTANEOUS | Status: DC

## 2014-11-30 MED ORDER — ULTICARE MINI PEN NEEDLES 31G X 6 MM MISC: Status: DC

## 2014-11-30 NOTE — Patient Instructions (Signed)
It was good to see you again today.  Make sure to follow up with the cardiologist.  Lantus sample today.  Pick up your new medications next week.      Come back to see me in about 4 - 6 weeks.  We'll make sure everything's going ok.  You can come back sooner if you need to.

## 2014-11-30 NOTE — Progress Notes (Signed)
Subjective:    Alicia West is a 44 y.o. female who presents to [facility] today for several issues:  1.  Diabetes:  Currently she is supposed to be on metformin, Victoza, and Lantus. However she has been taking her Lantus infrequently (once or twice weekly) if at all. Has not been taking her Victoza. She has not been able to obtain either injectable insulin medications secondary to cost. She is not taking metformin either.  No hypoglycemic events.  No paresthesia or peripheral nerve pain.  Last visit we decided to hold off on her Metformin for about 1 week and then restart it just one pill daily however she has not done this yet..   Asking for insulin samples.  States she will be able to obtain insulin this coming Monday. Lab Results  Component Value Date   HGBA1C 9.7 10/26/2014   2.   Hypertension:  Long-term problem for this patient.  No adverse effects from medication.  Not checking it regularly.  No HA, CP, dizziness, shortness of breath, palpitations, or LE swelling.   BP Readings from Last 3 Encounters:  11/30/14 138/98  10/26/14 186/113  03/18/14 115/81    3.  Palpitations:  Persists but these are somewhat better than last appointment. Occasional lightheadedness. Occasional dyspnea. Not necessarily associated with exertion. She can have palpitations at rest. No chest pain.   ROS as above per HPI, otherwise neg.    The following portions of the patient's history were reviewed and updated as appropriate: allergies, current medications, past medical history, family and social history, and problem list. Patient is a nonsmoker.    PMH reviewed.  Past Medical History  Diagnosis Date  . Diabetes mellitus   . Hypertension   . Herpes simplex labialis   . TB (tuberculosis) contact     + skin test,/- chest x-ray   No past surgical history on file.  Medications reviewed. Current Outpatient Prescriptions  Medication Sig Dispense Refill  . amLODipine (NORVASC) 5 MG tablet Take 1 tablet  (5 mg total) by mouth daily. 30 tablet 1  . atorvastatin (LIPITOR) 40 MG tablet Take 1 tablet (40 mg total) by mouth daily. 30 tablet 9  . cetirizine (ZYRTEC) 10 MG tablet Take 10 mg by mouth daily.    Marland Kitchen ibuprofen (ADVIL,MOTRIN) 800 MG tablet TAKE 1 TABLET BY MOUTH EVERY 8 HOURS AS NEEDED 30 tablet 1  . Insulin Glargine (LANTUS SOLOSTAR) 100 UNIT/ML Solostar Pen Inject 40 Units into the skin daily. 15 mL 3  . Insulin Pen Needle (NOVOTWIST) 32G X 5 MM MISC Use along with insulin injections up to 4 x daily 100 each 6  . Insulin Pen Needle (ULTICARE MICRO PEN NEEDLES) 31G X 8 MM MISC Use as directed up to 4 times daily 100 each 6  . lisinopril-hydrochlorothiazide (PRINZIDE,ZESTORETIC) 20-25 MG per tablet TAKE 1 TABLET BY MOUTH DAILY 30 tablet 3  . metFORMIN (GLUCOPHAGE) 500 MG tablet Take 1 tablet (500 mg total) by mouth 2 (two) times daily with a meal. 60 tablet 6  . mometasone (NASONEX) 50 MCG/ACT nasal spray Place 2 sprays into the nose daily.    Marland Kitchen ULTICARE MINI PEN NEEDLES 31G X 6 MM MISC     . VICTOZA 18 MG/3ML SOPN INJECT 0.6 MG SUBCUTANEOUSLY ONCE DAILY -- TITRATE UP TO 1.8 MG DAILY 9 mL 2   No current facility-administered medications for this visit.     Objective:   Physical Exam BP 138/98 mmHg  Pulse 111  Temp(Src) 98.5  F (36.9 C) (Oral)  Ht 5' 9"  (1.753 m)  Wt 239 lb 4.8 oz (108.546 kg)  BMI 35.32 kg/m2  LMP 10/31/2014 Gen:  Alert, cooperative patient who appears stated age in no acute distress.  Vital signs reviewed. HEENT: EOMI,  MMM Cardiac:  Slightly tachycardic rate. However it is regular rhythm today. Pulm:  Clear to auscultation bilaterally with good air movement.  No wheezes or rales noted.   Abd:  Soft/nondistended/nontender.  Good bowel sounds throughout all four quadrants.  .  Exts: Non edematous BL  LE, warm and well perfused.   No results found for this or any previous visit (from the past 72 hour(s)).

## 2014-12-01 NOTE — Assessment & Plan Note (Signed)
Diastolic blood pressure still elevated today. No change to medications as we will await cardiology input regarding possible addition of beta blocker.

## 2014-12-01 NOTE — Assessment & Plan Note (Signed)
Assess control as she has been out of most of her medications. As before, she is to restart her metformin just once a day for about a week. If she can tolerate this she can try twice a day we will continue that. However she begins to have diarrhea we will go back to just once per day. She was provided a Lantus sample here. Refill since has the ability to pay on Monday, both her Lantus and Victoza.

## 2014-12-01 NOTE — Assessment & Plan Note (Signed)
She has an upcoming appointment with cardiology artery schedule. We can likely add beta blocker to help with her symptoms. However like her more fully evaluated by cards to ensure nothing else going on. EKG last visit showed questionable undetermined septal infarct plus irregular rhythm followed by an accelerated regular rhythm. Was having chest pain on exertion inconsistent prior to last visit but none prior to this visit.

## 2014-12-01 NOTE — Assessment & Plan Note (Signed)
She is followed by Dr. Zadie Rhine in [facility] ophthalmology for both macular degeneration (foot she receives intraocular injections) as well as diabetic screening examinations. Just seen last month for this.

## 2014-12-08 ENCOUNTER — Encounter: Payer: Self-pay | Admitting: Internal Medicine

## 2014-12-08 ENCOUNTER — Ambulatory Visit (INDEPENDENT_AMBULATORY_CARE_PROVIDER_SITE_OTHER): Payer: No Typology Code available for payment source | Admitting: Internal Medicine

## 2014-12-08 VITALS — BP 158/100 | HR 96 | Ht 69.0 in | Wt 241.4 lb

## 2014-12-08 DIAGNOSIS — I471 Supraventricular tachycardia: Secondary | ICD-10-CM | POA: Diagnosis not present

## 2014-12-08 DIAGNOSIS — R0789 Other chest pain: Secondary | ICD-10-CM | POA: Diagnosis not present

## 2014-12-08 DIAGNOSIS — R002 Palpitations: Secondary | ICD-10-CM | POA: Diagnosis not present

## 2014-12-08 DIAGNOSIS — I10 Essential (primary) hypertension: Secondary | ICD-10-CM | POA: Diagnosis not present

## 2014-12-08 NOTE — Assessment & Plan Note (Signed)
She is on a statin. She will continue her medications. She is encouraged to lose weight and reduce her fat intake.

## 2014-12-08 NOTE — Assessment & Plan Note (Signed)
Her blood pressure is elevated. She may ultimately need up titration of her medications. For now she is encouraged to maintain a low-salt diet and attempt to lose weight, and exercise.

## 2014-12-08 NOTE — Assessment & Plan Note (Signed)
The patient has an abnormal ECG, multiple cardiac risk factors, and chest pressure. She is able to walk. She will undergo exercise treadmill testing.

## 2014-12-08 NOTE — Assessment & Plan Note (Signed)
She does have palpitations, that I can find no evidence of any SVT. She will undergo watchful waiting.

## 2014-12-08 NOTE — Progress Notes (Signed)
HPI Alicia West is referred today for evaluation of palpitations. She is a 44 yo obese woman with a h/o HTN, DM, obesity and dyslipidemia. She was found to have an abnormal ECG and is referred for evaluation. She notes symptoms of palpitations but has had no documented SVT. She notes that when she walks, she gets sob and feels chest pressure.  Allergies  Allergen Reactions  . Penicillins Other (See Comments)    Unknown childhood reaction  . Ace Inhibitors Cough     Current Outpatient Prescriptions  Medication Sig Dispense Refill  . amLODipine (NORVASC) 5 MG tablet Take 1 tablet (5 mg total) by mouth daily. 30 tablet 1  . atorvastatin (LIPITOR) 40 MG tablet Take 1 tablet (40 mg total) by mouth daily. 30 tablet 9  . cetirizine (ZYRTEC) 10 MG tablet Take 10 mg by mouth daily.    Marland Kitchen ibuprofen (ADVIL,MOTRIN) 800 MG tablet TAKE 1 TABLET BY MOUTH EVERY 8 HOURS AS NEEDED 30 tablet 1  . Insulin Glargine (LANTUS SOLOSTAR) 100 UNIT/ML Solostar Pen Inject 40 Units into the skin daily. 1 pen 0  . metFORMIN (GLUCOPHAGE) 500 MG tablet Take 1 tablet (500 mg total) by mouth 2 (two) times daily with a meal. 60 tablet 6  . mometasone (NASONEX) 50 MCG/ACT nasal spray Place 2 sprays into the nose daily.    Marland Kitchen ULTICARE MINI PEN NEEDLES 31G X 6 MM MISC Use to inject insulin subQ once daily for diabetes mellitus type 2 100 each 2  . VICTOZA 18 MG/3ML SOPN INJECT 0.6 MG SUBCUTANEOUSLY ONCE DAILY -- TITRATE UP TO 1.8 MG DAILY 9 mL 2  . NOVOTWIST 32G X 5 MM MISC      No current facility-administered medications for this visit.     Past Medical History  Diagnosis Date  . Diabetes mellitus   . Hypertension   . Herpes simplex labialis   . TB (tuberculosis) contact     + skin test,/- chest x-ray - in mid 2000's    ROS:   All systems reviewed and negative except as noted in the HPI.   No past surgical history on file.   Family History  Problem Relation Age of Onset  . Hypertension Mother   .  Hypertension Father   . Diabetes Mellitus II Mother   . Congestive Heart Failure Mother     Deceased from complications of CHF  . Diabetes Mellitus II Father   . Diabetes Mellitus II Brother      Social History   Social History  . Marital Status: Single    Spouse Name: N/A  . Number of Children: N/A  . Years of Education: N/A   Occupational History  . Not on file.   Social History Main Topics  . Smoking status: Never Smoker   . Smokeless tobacco: Never Used  . Alcohol Use: No  . Drug Use: No  . Sexual Activity: Yes    Birth Control/ Protection: Coitus interruptus   Other Topics Concern  . Not on file   Social History Narrative     BP 158/100 mmHg  Pulse 96  Ht 5' 9"  (1.753 m)  Wt 241 lb 6.4 oz (109.498 kg)  BMI 35.63 kg/m2  LMP 12/08/2014  Physical Exam:  Well appearing but obese middle aged woman, NAD HEENT: Unremarkable Neck:  7 cm JVD, no thyromegally Lymphatics:  No adenopathy Back:  No CVA tenderness Lungs:  Clear with no wheezes HEART:  Regular rate rhythm, no  murmurs, no rubs, no clicks Abd:  soft, positive bowel sounds, no organomegally, no rebound, no guarding Ext:  2 plus pulses, no edema, no cyanosis, no clubbing Skin:  No rashes no nodules Neuro:  CN II through XII intact, motor grossly intact  EKG - nsr with QT prolongation.   Assess/Plan:

## 2014-12-08 NOTE — Patient Instructions (Signed)
Your physician recommends that you continue on your current medications as directed. Please refer to the Current Medication list given to you today.  Your physician has requested that you have an exercise tolerance test. For further information please visit [email or web address]. Please also follow instruction sheet, as given.   

## 2014-12-14 ENCOUNTER — Ambulatory Visit (INDEPENDENT_AMBULATORY_CARE_PROVIDER_SITE_OTHER): Payer: No Typology Code available for payment source | Admitting: Podiatry

## 2014-12-14 ENCOUNTER — Ambulatory Visit (INDEPENDENT_AMBULATORY_CARE_PROVIDER_SITE_OTHER): Payer: No Typology Code available for payment source

## 2014-12-14 ENCOUNTER — Encounter: Payer: Self-pay | Admitting: Podiatry

## 2014-12-14 ENCOUNTER — Telehealth: Payer: Self-pay | Admitting: Family Medicine

## 2014-12-14 VITALS — BP 150/101 | HR 97 | Resp 16 | Ht 69.0 in | Wt 241.0 lb

## 2014-12-14 DIAGNOSIS — E119 Type 2 diabetes mellitus without complications: Secondary | ICD-10-CM

## 2014-12-14 DIAGNOSIS — E1142 Type 2 diabetes mellitus with diabetic polyneuropathy: Secondary | ICD-10-CM

## 2014-12-14 DIAGNOSIS — M7752 Other enthesopathy of left foot: Secondary | ICD-10-CM | POA: Diagnosis not present

## 2014-12-14 DIAGNOSIS — M779 Enthesopathy, unspecified: Secondary | ICD-10-CM

## 2014-12-14 DIAGNOSIS — M778 Other enthesopathies, not elsewhere classified: Secondary | ICD-10-CM

## 2014-12-14 MED ORDER — GABAPENTIN 100 MG PO CAPS: 100.0000 mg | ORAL_CAPSULE | Freq: Every day | ORAL | Status: DC

## 2014-12-14 NOTE — Telephone Encounter (Signed)
Went to foot dr and found out the BP medication she was taking was the cause of the feet swelling. Her BP has been running high. Needs to talk to dr Mingo Amber ASAP about this

## 2014-12-14 NOTE — Progress Notes (Signed)
   Subjective:    Patient ID: Alicia West, female    DOB: February 28, 1970, 44 y.o.   MRN: [id number]  HPI: She presents today for a chief complaint of pain to her bilateral ankles and feet with swelling for the past one and half years.    Review of Systems  Eyes: Positive for visual disturbance.  Respiratory: Positive for shortness of breath and wheezing.   Cardiovascular: Positive for palpitations and leg swelling.  Gastrointestinal: Positive for abdominal distention.  Neurological: Positive for light-headedness and numbness.  All other systems reviewed and are negative.      Objective:   Physical Exam: 44 year old female vital signs stable she is alert and oriented 3. Pulses are palpable. Neurologic sensorium slightly diminished per Semmes-Weinstein monofilament deep tendon reflexes are intact muscle strength is 5 over 5 dorsiflexors plantar flexors and inverters everters all intrinsic musculature is intact. Orthopedic evaluation of Straits all joints distal to the ankle for range of motion without crepitation. She has tenderness on palpation of the sinus tarsi of the left foot. Cutaneous evaluation of Straits no skin breakdown or open lesions.        Assessment & Plan:  Assessment: Capsulitis subtalar joint left with diabetic peripheral neuropathy bilateral.  Plan: Discussed etiology pathology conservative versus surgical therapies. Started her on gabapentin 100 mg 1 by mouth daily at bedtime and I will follow-up with her in 1 month for med check. I injected the subtalar joint today with Kenalog and local anesthetic.

## 2014-12-15 MED ORDER — LOSARTAN POTASSIUM-HCTZ 100-12.5 MG PO TABS: 1.0000 | ORAL_TABLET | Freq: Every day | ORAL | Status: DC

## 2014-12-15 NOTE — Telephone Encounter (Signed)
Called and spoke with patient.  She is having leg swelling and her BP's have been running high.  Plan is to stop Norvasc and start Losartan-HCTZ combination.  She will call to schedule nurse visit in 2 weeks.

## 2015-01-05 ENCOUNTER — Telehealth: Payer: Self-pay | Admitting: Family Medicine

## 2015-01-05 NOTE — Telephone Encounter (Signed)
Pt thought she had appt for derm clinic in dec. Would like to talk to a dr or nurse about this

## 2015-01-06 ENCOUNTER — Other Ambulatory Visit: Payer: Self-pay | Admitting: Physician Assistant

## 2015-01-06 ENCOUNTER — Encounter: Payer: No Typology Code available for payment source | Admitting: Physician Assistant

## 2015-01-06 ENCOUNTER — Ambulatory Visit (INDEPENDENT_AMBULATORY_CARE_PROVIDER_SITE_OTHER): Payer: No Typology Code available for payment source

## 2015-01-06 ENCOUNTER — Other Ambulatory Visit: Payer: Self-pay | Admitting: Internal Medicine

## 2015-01-06 DIAGNOSIS — R0789 Other chest pain: Secondary | ICD-10-CM

## 2015-01-06 DIAGNOSIS — I10 Essential (primary) hypertension: Secondary | ICD-10-CM

## 2015-01-06 DIAGNOSIS — R002 Palpitations: Secondary | ICD-10-CM

## 2015-01-06 LAB — EXERCISE TOLERANCE TEST
CHL CUP MPHR: 176 {beats}/min
CSEPPHR: 150 {beats}/min
Estimated workload: 7 METS
Exercise duration (min): 6 min
Percent HR: 85 %
RPE: 19
Rest HR: 99 {beats}/min

## 2015-01-06 MED ORDER — METOPROLOL TARTRATE 25 MG PO TABS: 25.0000 mg | ORAL_TABLET | Freq: Two times a day (BID) | ORAL | Status: DC

## 2015-01-11 NOTE — Telephone Encounter (Signed)
Contacted pt to discuss below. I told her that the clinic was full in December and that she was on the cancellation list.  Told her that if she doesn't get in on that one then she can call latter part of December to see about getting on that schedule. Alicia West, April D, [place]

## 2015-01-13 ENCOUNTER — Other Ambulatory Visit: Payer: Self-pay | Admitting: Family Medicine

## 2015-01-13 ENCOUNTER — Ambulatory Visit: Payer: No Typology Code available for payment source | Admitting: Podiatry

## 2015-02-18 ENCOUNTER — Other Ambulatory Visit: Payer: Self-pay | Admitting: Physician Assistant

## 2015-02-20 ENCOUNTER — Encounter (HOSPITAL_COMMUNITY): Payer: Self-pay

## 2015-02-20 DIAGNOSIS — N179 Acute kidney failure, unspecified: Secondary | ICD-10-CM | POA: Diagnosis present

## 2015-02-20 DIAGNOSIS — Z8249 Family history of ischemic heart disease and other diseases of the circulatory system: Secondary | ICD-10-CM | POA: Diagnosis not present

## 2015-02-20 DIAGNOSIS — Z833 Family history of diabetes mellitus: Secondary | ICD-10-CM | POA: Diagnosis not present

## 2015-02-20 DIAGNOSIS — E785 Hyperlipidemia, unspecified: Secondary | ICD-10-CM | POA: Diagnosis present

## 2015-02-20 DIAGNOSIS — Z794 Long term (current) use of insulin: Secondary | ICD-10-CM | POA: Diagnosis not present

## 2015-02-20 DIAGNOSIS — Z888 Allergy status to other drugs, medicaments and biological substances status: Secondary | ICD-10-CM | POA: Diagnosis not present

## 2015-02-20 DIAGNOSIS — E669 Obesity, unspecified: Secondary | ICD-10-CM | POA: Diagnosis present

## 2015-02-20 DIAGNOSIS — R112 Nausea with vomiting, unspecified: Secondary | ICD-10-CM | POA: Diagnosis present

## 2015-02-20 DIAGNOSIS — I10 Essential (primary) hypertension: Secondary | ICD-10-CM | POA: Diagnosis present

## 2015-02-20 DIAGNOSIS — D509 Iron deficiency anemia, unspecified: Secondary | ICD-10-CM | POA: Diagnosis not present

## 2015-02-20 DIAGNOSIS — Z880 Allergy status to penicillin: Secondary | ICD-10-CM

## 2015-02-20 DIAGNOSIS — Z7982 Long term (current) use of aspirin: Secondary | ICD-10-CM | POA: Diagnosis not present

## 2015-02-20 DIAGNOSIS — E1165 Type 2 diabetes mellitus with hyperglycemia: Secondary | ICD-10-CM | POA: Diagnosis present

## 2015-02-20 DIAGNOSIS — E860 Dehydration: Secondary | ICD-10-CM | POA: Diagnosis not present

## 2015-02-20 DIAGNOSIS — J309 Allergic rhinitis, unspecified: Secondary | ICD-10-CM | POA: Diagnosis present

## 2015-02-20 DIAGNOSIS — Z6830 Body mass index (BMI) 30.0-30.9, adult: Secondary | ICD-10-CM

## 2015-02-20 DIAGNOSIS — K5909 Other constipation: Secondary | ICD-10-CM | POA: Diagnosis not present

## 2015-02-20 LAB — COMPREHENSIVE METABOLIC PANEL
ALK PHOS: 95 U/L (ref 38–126)
ALT: 13 U/L — AB (ref 14–54)
ANION GAP: 12 (ref 5–15)
AST: 14 U/L — ABNORMAL LOW (ref 15–41)
Albumin: 3.5 g/dL (ref 3.5–5.0)
BUN: 28 mg/dL — ABNORMAL HIGH (ref 6–20)
CALCIUM: 10.6 mg/dL — AB (ref 8.9–10.3)
CO2: 28 mmol/L (ref 22–32)
CREATININE: 2.85 mg/dL — AB (ref 0.44–1.00)
Chloride: 98 mmol/L — ABNORMAL LOW (ref 101–111)
GFR, EST AFRICAN AMERICAN: 22 mL/min — AB (ref >60)
GFR, EST NON AFRICAN AMERICAN: 19 mL/min — AB (ref >60)
Glucose, Bld: 146 mg/dL — ABNORMAL HIGH (ref 65–99)
Potassium: 3.5 mmol/L (ref 3.5–5.1)
SODIUM: 138 mmol/L (ref 135–145)
TOTAL PROTEIN: 8.1 g/dL (ref 6.5–8.1)
Total Bilirubin: 1 mg/dL (ref 0.3–1.2)

## 2015-02-20 LAB — CBC
HCT: 33.9 % — ABNORMAL LOW (ref 36.0–46.0)
HEMOGLOBIN: 11.2 g/dL — AB (ref 12.0–15.0)
MCH: 26.4 pg (ref 26.0–34.0)
MCHC: 33 g/dL (ref 30.0–36.0)
MCV: 80 fL (ref 78.0–100.0)
PLATELETS: 457 10*3/uL — AB (ref 150–400)
RBC: 4.24 MIL/uL (ref 3.87–5.11)
RDW: 14.9 % (ref 11.5–15.5)
WBC: 10.7 10*3/uL — AB (ref 4.0–10.5)

## 2015-02-20 LAB — LIPASE, BLOOD: Lipase: 21 U/L (ref 11–51)

## 2015-02-20 LAB — URINALYSIS, ROUTINE W REFLEX MICROSCOPIC
Bilirubin Urine: NEGATIVE
Glucose, UA: NEGATIVE mg/dL
HGB URINE DIPSTICK: NEGATIVE
KETONES UR: NEGATIVE mg/dL
LEUKOCYTES UA: NEGATIVE
NITRITE: NEGATIVE
Protein, ur: NEGATIVE mg/dL
SPECIFIC GRAVITY, URINE: 1.005 (ref 1.005–1.030)
pH: 5.5 (ref 5.0–8.0)

## 2015-02-20 LAB — POC URINE PREG, ED: PREG TEST UR: NEGATIVE

## 2015-02-20 NOTE — ED Notes (Signed)
Pt having general body aches, abd bloating, having pelvic pain, back spasms, had two BM's last night but was not normal BMs for her. Vomited today x 2.

## 2015-02-21 ENCOUNTER — Other Ambulatory Visit: Payer: Self-pay | Admitting: *Deleted

## 2015-02-21 ENCOUNTER — Encounter (HOSPITAL_COMMUNITY): Payer: Self-pay | Admitting: Emergency Medicine

## 2015-02-21 ENCOUNTER — Observation Stay (HOSPITAL_COMMUNITY)
Admission: EM | Admit: 2015-02-21 | Discharge: 2015-02-23 | DRG: 392 | Disposition: A | Discharge status: Home / Self Care | Payer: BLUE CROSS/BLUE SHIELD | Attending: Family Medicine | Admitting: Family Medicine

## 2015-02-21 ENCOUNTER — Emergency Department (HOSPITAL_COMMUNITY): Payer: BLUE CROSS/BLUE SHIELD

## 2015-02-21 DIAGNOSIS — N189 Chronic kidney disease, unspecified: Secondary | ICD-10-CM | POA: Insufficient documentation

## 2015-02-21 DIAGNOSIS — K5909 Other constipation: Secondary | ICD-10-CM

## 2015-02-21 DIAGNOSIS — K5900 Constipation, unspecified: Secondary | ICD-10-CM

## 2015-02-21 DIAGNOSIS — I10 Essential (primary) hypertension: Secondary | ICD-10-CM

## 2015-02-21 DIAGNOSIS — R1084 Generalized abdominal pain: Secondary | ICD-10-CM

## 2015-02-21 DIAGNOSIS — E1159 Type 2 diabetes mellitus with other circulatory complications: Secondary | ICD-10-CM | POA: Diagnosis present

## 2015-02-21 DIAGNOSIS — Z794 Long term (current) use of insulin: Secondary | ICD-10-CM

## 2015-02-21 DIAGNOSIS — I152 Hypertension secondary to endocrine disorders: Secondary | ICD-10-CM | POA: Diagnosis present

## 2015-02-21 DIAGNOSIS — E1165 Type 2 diabetes mellitus with hyperglycemia: Secondary | ICD-10-CM

## 2015-02-21 DIAGNOSIS — E118 Type 2 diabetes mellitus with unspecified complications: Secondary | ICD-10-CM

## 2015-02-21 DIAGNOSIS — E1140 Type 2 diabetes mellitus with diabetic neuropathy, unspecified: Secondary | ICD-10-CM | POA: Diagnosis present

## 2015-02-21 DIAGNOSIS — R109 Unspecified abdominal pain: Secondary | ICD-10-CM

## 2015-02-21 DIAGNOSIS — N179 Acute kidney failure, unspecified: Secondary | ICD-10-CM | POA: Diagnosis present

## 2015-02-21 HISTORY — DX: Other constipation: K59.09

## 2015-02-21 LAB — BASIC METABOLIC PANEL
Anion gap: 9 (ref 5–15)
BUN: 24 mg/dL — AB (ref 6–20)
CHLORIDE: 104 mmol/L (ref 101–111)
CO2: 29 mmol/L (ref 22–32)
Calcium: 9.8 mg/dL (ref 8.9–10.3)
Creatinine, Ser: 2.43 mg/dL — ABNORMAL HIGH (ref 0.44–1.00)
GFR calc Af Amer: 27 mL/min — ABNORMAL LOW (ref >60)
GFR calc non Af Amer: 23 mL/min — ABNORMAL LOW (ref >60)
GLUCOSE: 153 mg/dL — AB (ref 65–99)
POTASSIUM: 3.7 mmol/L (ref 3.5–5.1)
SODIUM: 142 mmol/L (ref 135–145)

## 2015-02-21 LAB — CBC
HEMATOCRIT: 30.9 % — AB (ref 36.0–46.0)
HEMOGLOBIN: 10 g/dL — AB (ref 12.0–15.0)
MCH: 26.1 pg (ref 26.0–34.0)
MCHC: 32.4 g/dL (ref 30.0–36.0)
MCV: 80.7 fL (ref 78.0–100.0)
Platelets: 400 10*3/uL (ref 150–400)
RBC: 3.83 MIL/uL — AB (ref 3.87–5.11)
RDW: 15 % (ref 11.5–15.5)
WBC: 10.2 10*3/uL (ref 4.0–10.5)

## 2015-02-21 LAB — RAPID URINE DRUG SCREEN, HOSP PERFORMED
Amphetamines: NOT DETECTED
BARBITURATES: NOT DETECTED
BENZODIAZEPINES: NOT DETECTED
COCAINE: NOT DETECTED
Opiates: NOT DETECTED
TETRAHYDROCANNABINOL: NOT DETECTED

## 2015-02-21 LAB — GLUCOSE, CAPILLARY
GLUCOSE-CAPILLARY: 229 mg/dL — AB (ref 65–99)
Glucose-Capillary: 200 mg/dL — ABNORMAL HIGH (ref 65–99)

## 2015-02-21 LAB — HIV ANTIBODY (ROUTINE TESTING W REFLEX): HIV Screen 4th Generation wRfx: NONREACTIVE

## 2015-02-21 IMAGING — CR DG ABDOMEN ACUTE W/ 1V CHEST
4 series · 4 of 4 positions shown · non-contrast
Comparison: Radiograph dated [DATE]

CLINICAL DATA: 44-year-old female with abdominal cramping.

EXAM:
DG ABDOMEN ACUTE W/ 1V CHEST

[chest pa]
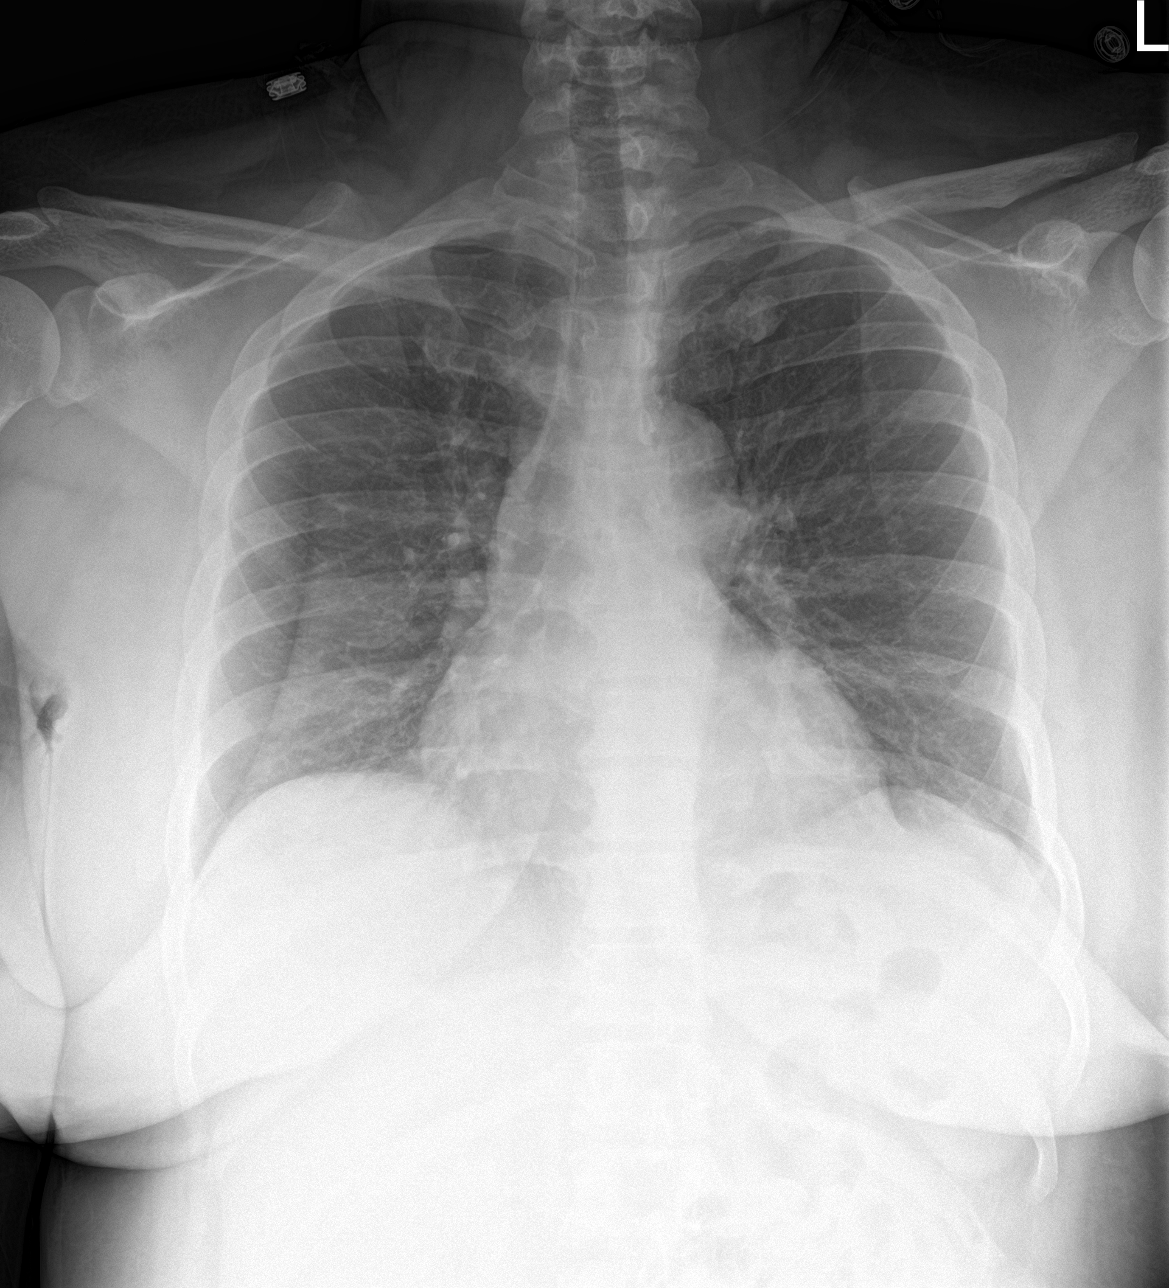

[abdomen erect]
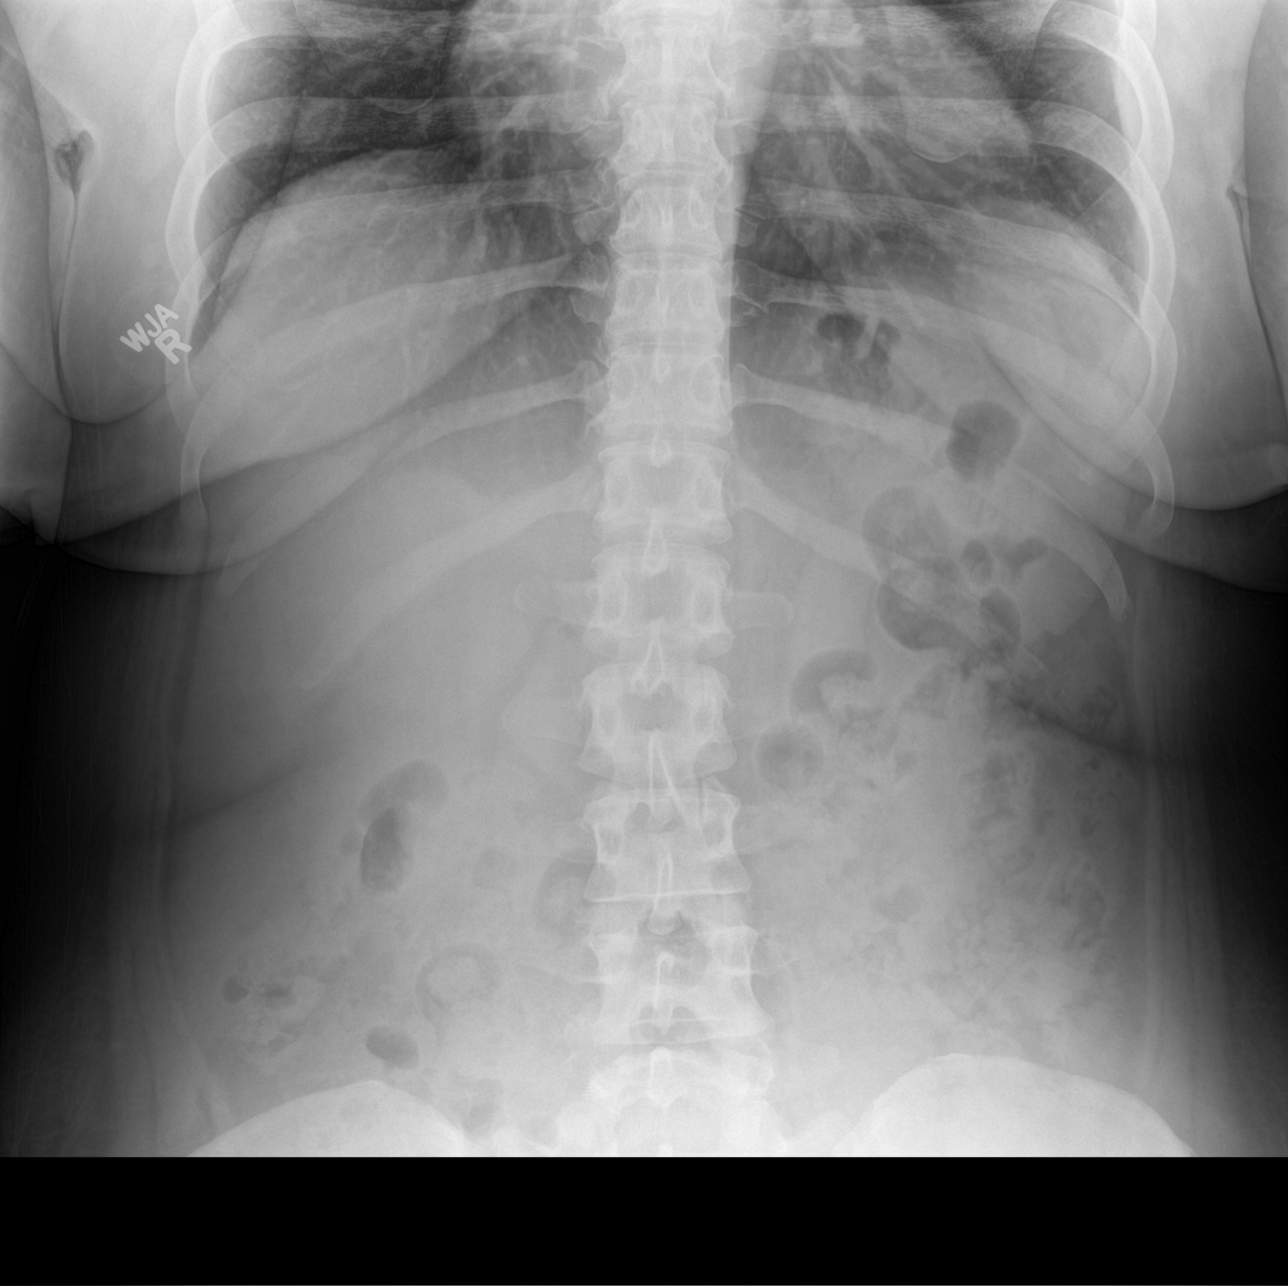

[abdomen supine (1 of 2)]
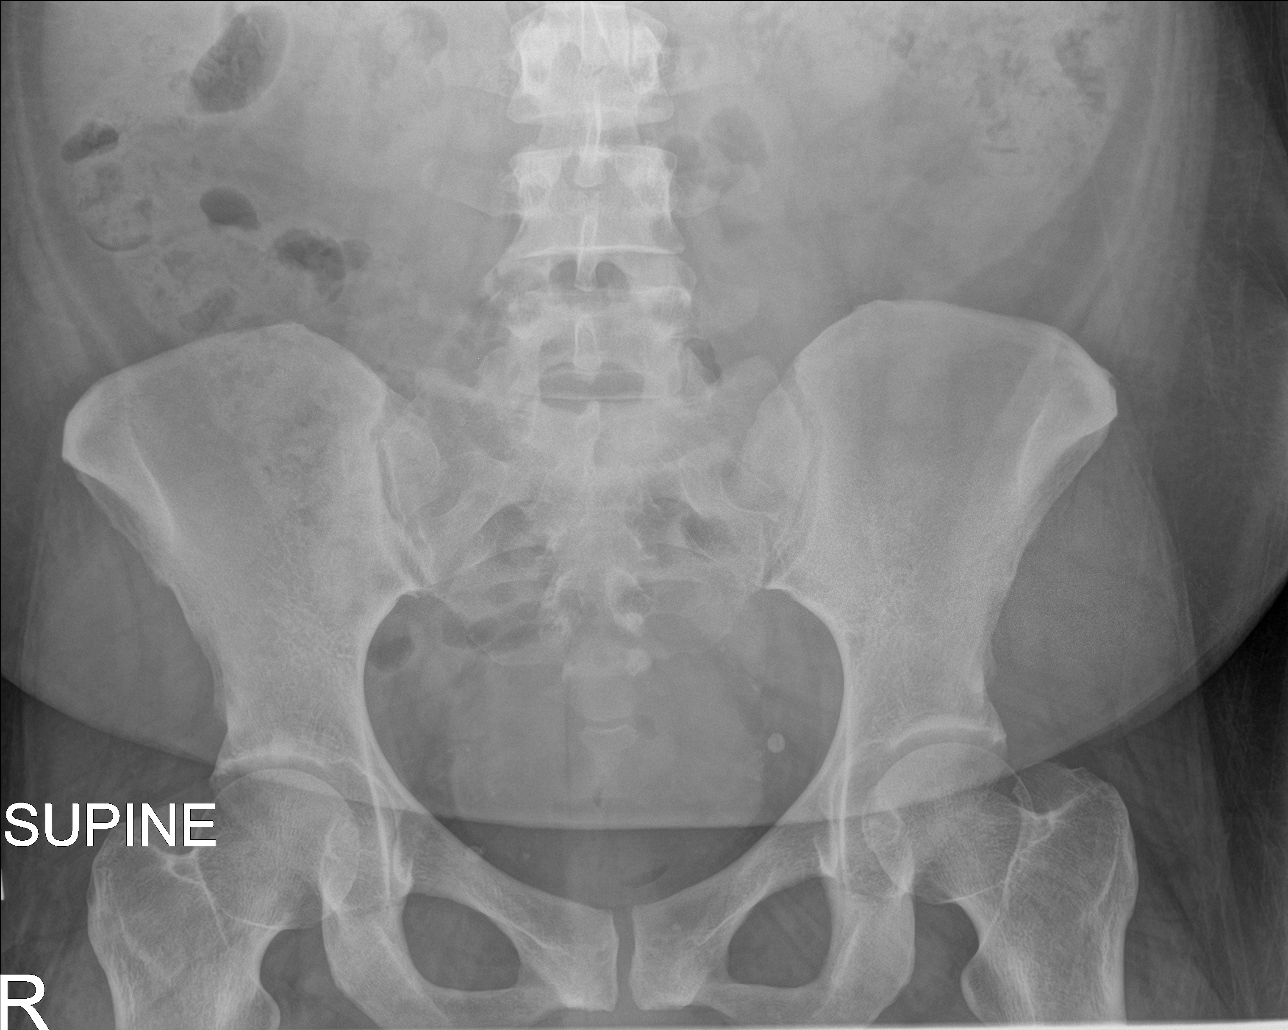

[abdomen supine (2 of 2)]
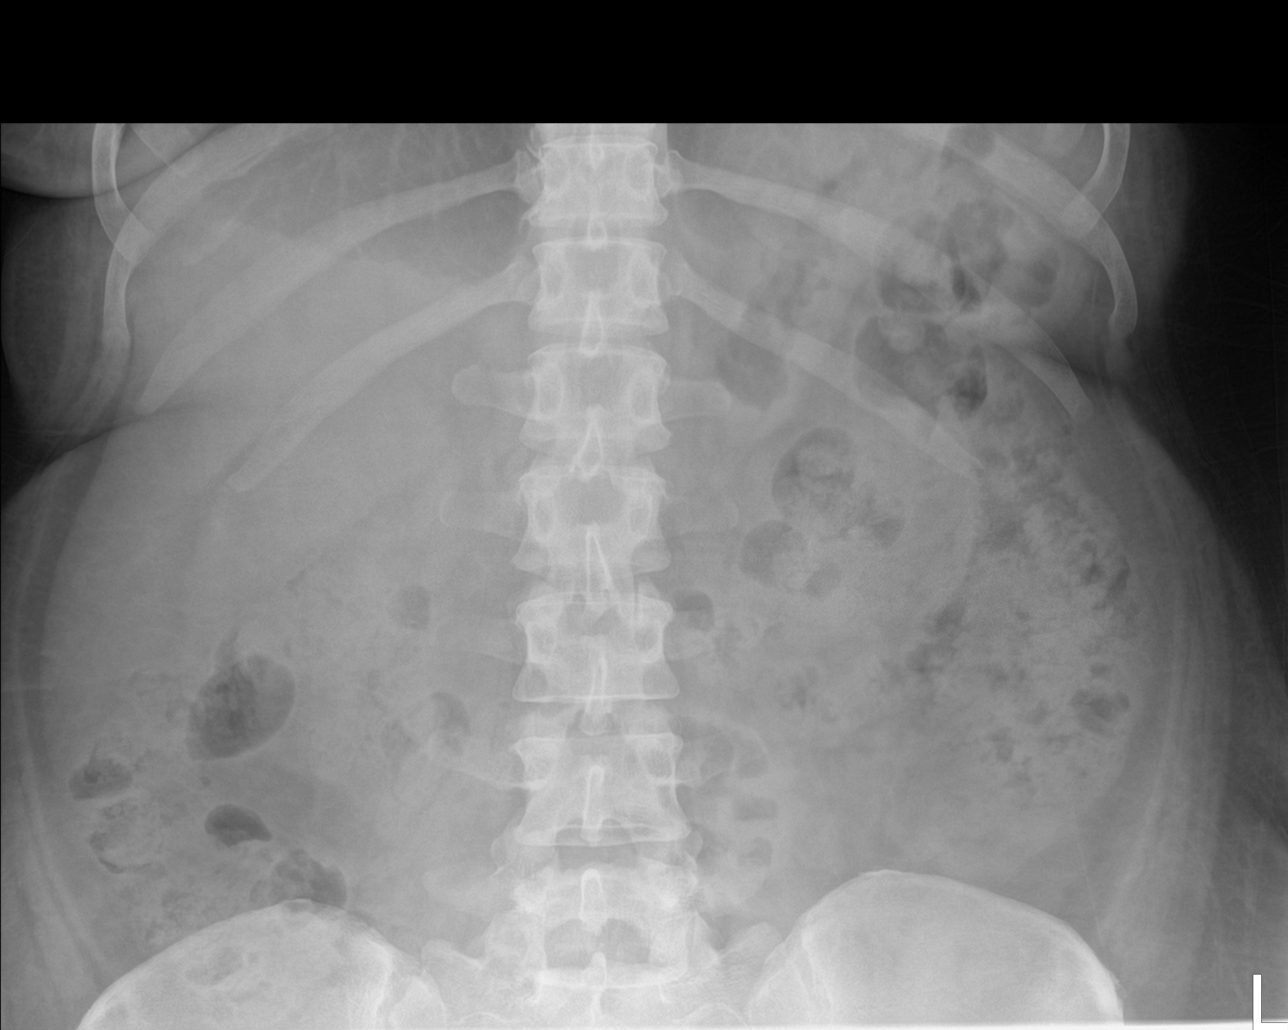

[4 of 4 positions shown; findings below may reference images not displayed]

FINDINGS: The lungs are clear. No pleural effusion or pneumothorax. The
cardiac silhouette is within normal limits.

Moderate stool throughout the colon. No evidence of bowel
obstruction. No free air. No radiopaque calculi. The osseous
structures appear unremarkable.
IMPRESSION: No acute cardiopulmonary process.

Constipation.  No evidence of bowel obstruction.

## 2015-02-21 MED ORDER — METOCLOPRAMIDE HCL 5 MG/ML IJ SOLN
10.0000 mg | Freq: Once | INTRAMUSCULAR | Status: AC
  Administered 2015-02-21: 10 mg via INTRAVENOUS
  Filled 2015-02-21: qty 2

## 2015-02-21 MED ORDER — INSULIN ASPART 100 UNIT/ML ~~LOC~~ SOLN
0.0000 [IU] | Freq: Three times a day (TID) | SUBCUTANEOUS | Status: DC
  Administered 2015-02-21: 3 [IU] via SUBCUTANEOUS
  Administered 2015-02-21 – 2015-02-22 (×3): 5 [IU] via SUBCUTANEOUS
  Administered 2015-02-22 – 2015-02-23 (×2): 8 [IU] via SUBCUTANEOUS
  Administered 2015-02-23: 2 [IU] via SUBCUTANEOUS
  Administered 2015-02-23: 3 [IU] via SUBCUTANEOUS
  Filled 2015-02-21 (×2): qty 1

## 2015-02-21 MED ORDER — ASPIRIN 81 MG PO CHEW
81.0000 mg | CHEWABLE_TABLET | Freq: Every day | ORAL | Status: DC
  Administered 2015-02-21 – 2015-02-23 (×3): 81 mg via ORAL
  Filled 2015-02-21 (×3): qty 1

## 2015-02-21 MED ORDER — GABAPENTIN 100 MG PO CAPS
100.0000 mg | ORAL_CAPSULE | Freq: Every day | ORAL | Status: DC
  Administered 2015-02-21 – 2015-02-22 (×2): 100 mg via ORAL
  Filled 2015-02-21 (×3): qty 1

## 2015-02-21 MED ORDER — HYDRALAZINE HCL 20 MG/ML IJ SOLN
5.0000 mg | Freq: Three times a day (TID) | INTRAMUSCULAR | Status: DC | PRN
  Administered 2015-02-21: 5 mg via INTRAVENOUS
  Filled 2015-02-21: qty 1

## 2015-02-21 MED ORDER — METOPROLOL TARTRATE 25 MG PO TABS
25.0000 mg | ORAL_TABLET | Freq: Two times a day (BID) | ORAL | Status: DC
  Administered 2015-02-21 – 2015-02-23 (×5): 25 mg via ORAL
  Filled 2015-02-21 (×5): qty 1

## 2015-02-21 MED ORDER — HEPARIN SODIUM (PORCINE) 5000 UNIT/ML IJ SOLN
5000.0000 [IU] | Freq: Three times a day (TID) | INTRAMUSCULAR | Status: DC
  Administered 2015-02-21 – 2015-02-23 (×7): 5000 [IU] via SUBCUTANEOUS
  Filled 2015-02-21 (×6): qty 1

## 2015-02-21 MED ORDER — ATORVASTATIN CALCIUM 40 MG PO TABS
40.0000 mg | ORAL_TABLET | Freq: Every day | ORAL | Status: DC
  Administered 2015-02-21 – 2015-02-23 (×3): 40 mg via ORAL
  Filled 2015-02-21 (×2): qty 1
  Filled 2015-02-21: qty 4

## 2015-02-21 MED ORDER — SODIUM CHLORIDE 0.9 % IV BOLUS (SEPSIS)
1000.0000 mL | Freq: Once | INTRAVENOUS | Status: AC
  Administered 2015-02-21: 1000 mL via INTRAVENOUS

## 2015-02-21 MED ORDER — ONDANSETRON HCL 4 MG/2ML IJ SOLN: 4.0000 mg | Freq: Four times a day (QID) | INTRAMUSCULAR | Status: DC | PRN

## 2015-02-21 MED ORDER — POLYETHYLENE GLYCOL 3350 17 G PO PACK
17.0000 g | PACK | Freq: Every day | ORAL | Status: DC | PRN
  Administered 2015-02-21: 17 g via ORAL
  Filled 2015-02-21: qty 1

## 2015-02-21 MED ORDER — INSULIN GLARGINE 100 UNIT/ML SOLOSTAR PEN: 40.0000 [IU] | PEN_INJECTOR | Freq: Every day | SUBCUTANEOUS | Status: DC

## 2015-02-21 MED ORDER — INSULIN GLARGINE 100 UNIT/ML ~~LOC~~ SOLN
40.0000 [IU] | Freq: Every day | SUBCUTANEOUS | Status: DC
  Administered 2015-02-21 – 2015-02-23 (×3): 40 [IU] via SUBCUTANEOUS
  Filled 2015-02-21 (×4): qty 0.4

## 2015-02-21 MED ORDER — POLYETHYLENE GLYCOL 3350 17 G PO PACK
17.0000 g | PACK | Freq: Every day | ORAL | Status: DC
  Administered 2015-02-22 – 2015-02-23 (×2): 17 g via ORAL
  Filled 2015-02-21 (×2): qty 1

## 2015-02-21 MED ORDER — SODIUM CHLORIDE 0.9 % IV SOLN
INTRAVENOUS | Status: DC
  Administered 2015-02-21: 08:00:00 via INTRAVENOUS

## 2015-02-21 MED ORDER — SODIUM CHLORIDE 0.9 % IV SOLN
INTRAVENOUS | Status: AC
  Administered 2015-02-21: 11:00:00 via INTRAVENOUS

## 2015-02-21 MED ORDER — LORATADINE 10 MG PO TABS
10.0000 mg | ORAL_TABLET | Freq: Every day | ORAL | Status: DC
  Administered 2015-02-21 – 2015-02-23 (×3): 10 mg via ORAL
  Filled 2015-02-21 (×3): qty 1

## 2015-02-21 MED ORDER — DOCUSATE SODIUM 100 MG PO CAPS
100.0000 mg | ORAL_CAPSULE | Freq: Two times a day (BID) | ORAL | Status: DC
  Administered 2015-02-21 – 2015-02-23 (×5): 100 mg via ORAL
  Filled 2015-02-21 (×5): qty 1

## 2015-02-21 MED ORDER — SENNA 8.6 MG PO TABS
1.0000 | ORAL_TABLET | Freq: Every day | ORAL | Status: DC
  Administered 2015-02-21 – 2015-02-23 (×3): 8.6 mg via ORAL
  Filled 2015-02-21 (×3): qty 1

## 2015-02-21 MED ORDER — ACETAMINOPHEN 325 MG PO TABS
650.0000 mg | ORAL_TABLET | Freq: Four times a day (QID) | ORAL | Status: DC | PRN
  Administered 2015-02-21 – 2015-02-22 (×3): 650 mg via ORAL
  Filled 2015-02-21 (×5): qty 2

## 2015-02-21 MED ORDER — ONDANSETRON HCL 4 MG/2ML IJ SOLN
4.0000 mg | Freq: Once | INTRAMUSCULAR | Status: AC
  Administered 2015-02-21: 4 mg via INTRAVENOUS

## 2015-02-21 NOTE — ED Notes (Signed)
Attempted report 

## 2015-02-21 NOTE — ED Provider Notes (Signed)
CSN: [id number]     Arrival date & time 02/20/15  2023 History  By signing my name below, I, Helane Gunther, attest that this documentation has been prepared under the direction and in the presence of Sudais Banghart, MD. Electronically Signed: Helane Gunther, ED Scribe. 02/21/2015. 1:17 AM.    Chief Complaint  Patient presents with  . Generalized Body Aches  . Emesis   Patient is a 45 y.o. female presenting with vomiting. The history is provided by the patient. No language interpreter was used.  Emesis Severity:  Moderate Duration:  1 day Timing:  Intermittent Number of daily episodes:  4 Quality:  Stomach contents Progression:  Unchanged Chronicity:  New Recent urination:  Increased Relieved by:  Nothing Associated symptoms: abdominal pain and myalgias   Abdominal pain:    Location:  Generalized   Quality:  Cramping   Severity:  Moderate   Onset quality:  Gradual   Duration:  1 day   Timing:  Intermittent   Chronicity:  New Myalgias:    Location:  Generalized   Quality:  Aching   Severity:  Mild   Onset quality:  Gradual   Duration:  1 day Risk factors: diabetes    HPI Comments: Alicia West is a 45 y.o. female who presents to the Emergency Department complaining of generalized body aches and 4 episodes of emesis onset today. Pt states she began to feel ill about 6 days ago. She reports associated abdominal distension and cramping pain radiating to the back, nausea, hard stool, constipation, urinary frequency, and loss of appetite. She states she has been drinking a lot of water to stay hydrated. She notes she has tried drinking milk because she is lactose intolerant, after which she had 2 BM's which were abnormal. She notes that she has recently ben taken off of lisinopril due to extreme right foot swelling. She notes she is not currently on a blood pressure pill and will not be able to have one prescribed until she makes an appointment with her PCP. She notes her sugar was  at 212 when she measured it today. She notes she is on metformin, Victoza, and Lantuss. She notes she has been receiving eye injections and notes her eyesight "has been great."   Past Medical History  Diagnosis Date  . Diabetes mellitus   . Hypertension   . Herpes simplex labialis   . TB (tuberculosis) contact     + skin test,/- chest x-ray - in mid 2000's   History reviewed. No pertinent past surgical history. Family History  Problem Relation Age of Onset  . Hypertension Mother   . Hypertension Father   . Diabetes Mellitus II Mother   . Congestive Heart Failure Mother     Deceased from complications of CHF  . Diabetes Mellitus II Father   . Diabetes Mellitus II Brother    Social History  Substance Use Topics  . Smoking status: Never Smoker   . Smokeless tobacco: Never Used  . Alcohol Use: No   OB History    No data available     Review of Systems  Constitutional: Positive for appetite change.  Gastrointestinal: Positive for nausea, vomiting, abdominal pain, constipation and abdominal distention.  Genitourinary: Positive for frequency.  Musculoskeletal: Positive for myalgias.  All other systems reviewed and are negative.   Allergies  Penicillins and Ace inhibitors  Home Medications   Prior to Admission medications   Medication Sig Start Date End Date Taking? Authorizing Provider  aspirin 81  MG tablet Take 81 mg by mouth daily.   Yes Historical Provider, MD  atorvastatin (LIPITOR) 40 MG tablet Take 1 tablet (40 mg total) by mouth daily. 11/23/14  Yes Alveda Reasons, MD  cetirizine (ZYRTEC) 10 MG tablet Take 10 mg by mouth daily as needed for allergies.    Yes Historical Provider, MD  gabapentin (NEURONTIN) 100 MG capsule Take 1 capsule (100 mg total) by mouth at bedtime. 12/14/14  Yes Max T Hyatt, DPM  ibuprofen (ADVIL,MOTRIN) 800 MG tablet TAKE 1 TABLET BY MOUTH EVERY 8 HOURS AS NEEDED Patient taking differently: TAKE 1 TABLET BY MOUTH EVERY 8 HOURS AS NEEDED FOR  PAIN 04/21/14  Yes Alveda Reasons, MD  Insulin Glargine (LANTUS SOLOSTAR) 100 UNIT/ML Solostar Pen Inject 40 Units into the skin daily. 11/30/14  Yes Alveda Reasons, MD  losartan-hydrochlorothiazide (HYZAAR) 100-12.5 MG tablet TAKE 1 TABLET BY MOUTH DAILY. 01/14/15  Yes Alveda Reasons, MD  metFORMIN (GLUCOPHAGE) 500 MG tablet Take 1 tablet (500 mg total) by mouth 2 (two) times daily with a meal. 11/23/14  Yes Alveda Reasons, MD  metoprolol (LOPRESSOR) 25 MG tablet Take 1 tablet (25 mg total) by mouth 2 (two) times daily. 01/06/15  Yes Liliane Shi, PA-C  NOVOTWIST 32G X 5 MM MISC  11/23/14  Yes Historical Provider, MD  ULTICARE MINI PEN NEEDLES 31G X 6 MM MISC Use to inject insulin subQ once daily for diabetes mellitus type 2 11/30/14  Yes Alveda Reasons, MD  VICTOZA 18 MG/3ML SOPN INJECT 0.6 MG SUBCUTANEOUSLY ONCE DAILY -- TITRATE UP TO 1.8 MG DAILY 11/17/14  Yes Alveda Reasons, MD   BP 177/108 mmHg  Pulse 108  Temp(Src) 98.8 F (37.1 C) (Oral)  Resp 18  SpO2 98%  LMP 02/05/2015 Physical Exam  Constitutional: She is oriented to person, place, and time. She appears well-developed and well-nourished.  HENT:  Head: Normocephalic and atraumatic.  Mouth/Throat: Oropharynx is clear and moist.  Thrush on the tongue  Eyes: Conjunctivae are normal. Pupils are equal, round, and reactive to light. Right eye exhibits no discharge. Left eye exhibits no discharge.  Neck: Normal range of motion. Neck supple.  Cardiovascular: Normal rate, regular rhythm and normal heart sounds.  Exam reveals no gallop and no friction rub.   No murmur heard. Pulmonary/Chest: Effort normal and breath sounds normal. No respiratory distress. She has no wheezes. She has no rales.  Abdominal: Soft. She exhibits no mass. There is no tenderness. There is no rebound and no guarding.  Hyperactive bowel sounds. Some constipation in the colon  Musculoskeletal: Normal range of motion.  Neurological: She is alert and  oriented to person, place, and time. She has normal reflexes. Coordination normal.  Skin: Skin is warm and dry. No rash noted. She is not diaphoretic. No erythema.  Psychiatric: She has a normal mood and affect.  Nursing note and vitals reviewed.   ED Course  Procedures  DIAGNOSTIC STUDIES: Oxygen Saturation is 99% on RA, normal by my interpretation.    COORDINATION OF CARE: 1:12 AM - Discussed abnormal lab results regarding kidney function. Discussed plans to order diagnostic studies and imaging, as well as IV fluids. Pt advised of plan for treatment and pt agrees.  Labs Review Labs Reviewed  COMPREHENSIVE METABOLIC PANEL - Abnormal; Notable for the following:    Chloride 98 (*)    Glucose, Bld 146 (*)    BUN 28 (*)    Creatinine, Ser 2.85 (*)  Calcium 10.6 (*)    AST 14 (*)    ALT 13 (*)    GFR calc non Af Amer 19 (*)    GFR calc Af Amer 22 (*)    All other components within normal limits  CBC - Abnormal; Notable for the following:    WBC 10.7 (*)    Hemoglobin 11.2 (*)    HCT 33.9 (*)    Platelets 457 (*)    All other components within normal limits  LIPASE, BLOOD  URINALYSIS, ROUTINE W REFLEX MICROSCOPIC (NOT AT [facility])  POC URINE PREG, ED    Imaging Review Dg Abd Acute W/chest  02/21/2015  CLINICAL DATA:  45 year old female with abdominal cramping. EXAM: DG ABDOMEN ACUTE W/ 1V CHEST COMPARISON:  Radiograph dated 01/27/2011 FINDINGS: The lungs are clear. No pleural effusion or pneumothorax. The cardiac silhouette is within normal limits. Moderate stool throughout the colon. No evidence of bowel obstruction. No free air. No radiopaque calculi. The osseous structures appear unremarkable. IMPRESSION: No acute cardiopulmonary process. Constipation.  No evidence of bowel obstruction. Electronically Signed   By: Anner Crete M.D.   On: 02/21/2015 02:12   I have personally reviewed and evaluated these images and lab results as part of my medical decision-making.   EKG  Interpretation None      MDM   Final diagnoses:  None    Medications  sodium chloride 0.9 % bolus 1,000 mL (0 mLs Intravenous Stopped 02/21/15 0207)  ondansetron (ZOFRAN) injection 4 mg (4 mg Intravenous Given 02/21/15 0105)  metoCLOPramide (REGLAN) injection 10 mg (10 mg Intravenous Given 02/21/15 0229)  sodium chloride 0.9 % bolus 1,000 mL (1,000 mLs Intravenous New Bag/Given 02/21/15 0229)   Results for orders placed or performed during the hospital encounter of 02/21/15  Lipase, blood  Result Value Ref Range   Lipase 21 11 - 51 U/L  Comprehensive metabolic panel  Result Value Ref Range   Sodium 138 135 - 145 mmol/L   Potassium 3.5 3.5 - 5.1 mmol/L   Chloride 98 (L) 101 - 111 mmol/L   CO2 28 22 - 32 mmol/L   Glucose, Bld 146 (H) 65 - 99 mg/dL   BUN 28 (H) 6 - 20 mg/dL   Creatinine, Ser 2.85 (H) 0.44 - 1.00 mg/dL   Calcium 10.6 (H) 8.9 - 10.3 mg/dL   Total Protein 8.1 6.5 - 8.1 g/dL   Albumin 3.5 3.5 - 5.0 g/dL   AST 14 (L) 15 - 41 U/L   ALT 13 (L) 14 - 54 U/L   Alkaline Phosphatase 95 38 - 126 U/L   Total Bilirubin 1.0 0.3 - 1.2 mg/dL   GFR calc non Af Amer 19 (L) >60 mL/min   GFR calc Af Amer 22 (L) >60 mL/min   Anion gap 12 5 - 15  CBC  Result Value Ref Range   WBC 10.7 (H) 4.0 - 10.5 K/uL   RBC 4.24 3.87 - 5.11 MIL/uL   Hemoglobin 11.2 (L) 12.0 - 15.0 g/dL   HCT 33.9 (L) 36.0 - 46.0 %   MCV 80.0 78.0 - 100.0 fL   MCH 26.4 26.0 - 34.0 pg   MCHC 33.0 30.0 - 36.0 g/dL   RDW 14.9 11.5 - 15.5 %   Platelets 457 (H) 150 - 400 K/uL  Urinalysis, Routine w reflex microscopic (not at [facility])  Result Value Ref Range   Color, Urine YELLOW YELLOW   APPearance CLEAR CLEAR   Specific Gravity, Urine 1.005 1.005 - 1.030  pH 5.5 5.0 - 8.0   Glucose, UA NEGATIVE NEGATIVE mg/dL   Hgb urine dipstick NEGATIVE NEGATIVE   Bilirubin Urine NEGATIVE NEGATIVE   Ketones, ur NEGATIVE NEGATIVE mg/dL   Protein, ur NEGATIVE NEGATIVE mg/dL   Nitrite NEGATIVE NEGATIVE   Leukocytes, UA  NEGATIVE NEGATIVE  POC urine preg, ED (not at [facility])  Result Value Ref Range   Preg Test, Ur NEGATIVE NEGATIVE   Dg Abd Acute W/chest  02/21/2015  CLINICAL DATA:  45 year old female with abdominal cramping. EXAM: DG ABDOMEN ACUTE W/ 1V CHEST COMPARISON:  Radiograph dated 01/27/2011 FINDINGS: The lungs are clear. No pleural effusion or pneumothorax. The cardiac silhouette is within normal limits. Moderate stool throughout the colon. No evidence of bowel obstruction. No free air. No radiopaque calculi. The osseous structures appear unremarkable. IMPRESSION: No acute cardiopulmonary process. Constipation.  No evidence of bowel obstruction. Electronically Signed   By: Anner Crete M.D.   On: 02/21/2015 02:12      I personally performed the services described in this documentation, which was scribed in my presence. The recorded information has been reviewed and is accurate.     Veatrice Kells, MD 02/21/15 [phone]

## 2015-02-21 NOTE — ED Notes (Signed)
CBG 162

## 2015-02-21 NOTE — Progress Notes (Signed)
Notified MD of pt's BP 171/102. Pt is asymptomatic. Orders received for PRN hydralazine if > 371 systolic. Will continue to monitor.

## 2015-02-21 NOTE — ED Notes (Signed)
FP at bedside

## 2015-02-21 NOTE — H&P (Signed)
[facility] Admission History and Physical Service Pager: [phone]  Patient name: Alicia West Medical record number: [id number] Date of birth: 1970/08/09 Age: 45 y.o. Gender: female  Primary Care Provider: Annabell Sabal, MD Consultants: none Code Status: Full (per discussion on admission)  Chief Complaint: nausea/vomiting/abdominal pain/constipation  Assessment and Plan: Alicia West is a 45 y.o. female presenting with nausea/vomiting/abdominal pain/constipation with AKI. PMH is significant for Hypertension and Type 2 DM.   Nausea/vomiting/abdominal pain/constipation: Nausea/vomiting/abdominal pain likely secondary to constipation. Constipation is chronic in nature, typically has 1 BM/week. Last BM was today, non-bloody and non-melanous. Not likely infectious cause as pt is afebrile without significant increase in WBC. Urine pregnancy test negative. X-Ray shows moderate constipation, no signs of bowel obstruction. Differentials include gastroparesis secondary to uncontrolled diabetes, drug/medication side effect, or idiopathic cause.  - Admit to med surg, attending Dr. Nori Riis - Zofran PRN for nausea/vomiting - Bowel regimen for constipation: Miralax, Colace, and soap suds enema - UDS - Lipase - mIVF while patient has no appetite due to nausea  AKI: Creatine of 2.85 on admission. Cr was 0.75 in September 2016. Was taking Losartan-HCTZ but that was discontinued on 12/15/2014 due to leg swelling. Was supposed to follow up in clinic for BP med management but has not been able to get an appt yet. Increase in Cr likely from dehydration secondary to vomiting. Could also be from uncontrolled diabetes but it is unlikely to have increased that quickly since September.  - mIVF - AM BMP - Hold home Metformin - Will do further workup if Cr does not decrease with IVF  Hypertension: Uncontrolled. Initial BP on admission was 189/120. Now 146/90 without any medication  intervention.  Losartan-HCTZ discontinued in 9/16. Only taking Metoprolol now at home.  - Continue Metoprolol - Vitals per protocol - Will add PRN Hydralazine if BP rises again  DM Type 2: Uncontrolled. Last Hgb A1C 9.7 on 10/26/14. Takes Lantus 40 units in AM, Metformin 500 mg BID, and Victoza at home.  - Continue Lantus 40 units in AM - Moderate SSI - CBGs - Hgb A1C   FEN/GI: NS @ 125 cc/hour, carb modified diet Prophylaxis: Heparin  Disposition: Admit to med surg, attending Dr. Nori Riis  History of Present Illness:  Alicia West is a 45 y.o. female presenting with nausea/vomiting/abdominal pain/constipation. Patient states that she has chronic constipation, usually she will have about 1-2 BMs/week. Over the last 2 weeks, she has had diffuse cramping abdominal pain. The pain radiates to her back. Over the last couple days, she developed nausea and non-bloody non-bilious vomiting. Over the last 24 hours she has vomiting 4 x. Pt decided to drink milk to stimulate BM (she is lactose intolerant) and she had a few "hard stools" over the last 24 hours. Admits to not drinking much fluids and poor appetite since nausea and vomiting started. Has only taken Ibuprofen for pain (600 mg every other day) which has not provided relief.  Admits to subjective fevers, headaches, increased thirst and urinary frequency  Review Of Systems: Per HPI with the following additions: numbness/tingling of fingers or toes Otherwise the remainder of the systems were negative.  Patient Active Problem List   Diagnosis Date Noted  . Chest pressure 12/08/2014  . Paroxysmal supraventricular tachycardia ([place]) 10/26/2014  . RUQ pain 03/11/2014  . Oral cancer ([place]) 10/01/2013  . Obesity (BMI 30-39.9) 05/01/2013  . Binge-eating disorder, moderate 03/17/2013  . Macular degeneration 03/17/2012  . Anxiety 01/10/2011  . Herpes  labialis 09/06/2010  . Osgood-Schlatter's disease 07/20/2010  . DM (diabetes mellitus), type 2,  uncontrolled ([place]) 10/18/2009  . POSITIVE PPD 10/18/2009  . HYPERLIPIDEMIA 12/01/2008  . ANEMIA, IRON DEFICIENCY, UNSPEC. 04/04/2006  . DEPRESSIVE DISORDER, NOS 04/04/2006  . HYPERTENSION, BENIGN SYSTEMIC 04/04/2006  . RHINITIS, ALLERGIC 04/04/2006  . Menorrhagia 04/04/2006    Past Medical History: Past Medical History  Diagnosis Date  . Diabetes mellitus   . Hypertension   . Herpes simplex labialis   . TB (tuberculosis) contact     + skin test,/- chest x-ray - in mid 2000's    Past Surgical History: History reviewed. No pertinent past surgical history.  Social History: Social History  Substance Use Topics  . Smoking status: Never Smoker   . Smokeless tobacco: Never Used  . Alcohol Use: No   Please also refer to relevant sections of EMR.  Family History: Family History  Problem Relation Age of Onset  . Hypertension Mother   . Hypertension Father   . Diabetes Mellitus II Mother   . Congestive Heart Failure Mother     Deceased from complications of CHF  . Diabetes Mellitus II Father   . Diabetes Mellitus II Brother     Allergies and Medications: Allergies  Allergen Reactions  . Penicillins Other (See Comments)    Unknown childhood reaction  . Ace Inhibitors Cough   No current facility-administered medications on file prior to encounter.   Current Outpatient Prescriptions on File Prior to Encounter  Medication Sig Dispense Refill  . atorvastatin (LIPITOR) 40 MG tablet Take 1 tablet (40 mg total) by mouth daily. 30 tablet 9  . cetirizine (ZYRTEC) 10 MG tablet Take 10 mg by mouth daily as needed for allergies.     Marland Kitchen gabapentin (NEURONTIN) 100 MG capsule Take 1 capsule (100 mg total) by mouth at bedtime. 30 capsule 3  . ibuprofen (ADVIL,MOTRIN) 800 MG tablet TAKE 1 TABLET BY MOUTH EVERY 8 HOURS AS NEEDED (Patient taking differently: TAKE 1 TABLET BY MOUTH EVERY 8 HOURS AS NEEDED FOR PAIN) 30 tablet 1  . Insulin Glargine (LANTUS SOLOSTAR) 100 UNIT/ML Solostar Pen  Inject 40 Units into the skin daily. 1 pen 0  . losartan-hydrochlorothiazide (HYZAAR) 100-12.5 MG tablet TAKE 1 TABLET BY MOUTH DAILY. 30 tablet 3  . metFORMIN (GLUCOPHAGE) 500 MG tablet Take 1 tablet (500 mg total) by mouth 2 (two) times daily with a meal. 60 tablet 6  . metoprolol (LOPRESSOR) 25 MG tablet Take 1 tablet (25 mg total) by mouth 2 (two) times daily. 60 tablet 0  . NOVOTWIST 32G X 5 MM MISC     . ULTICARE MINI PEN NEEDLES 31G X 6 MM MISC Use to inject insulin subQ once daily for diabetes mellitus type 2 100 each 2  . VICTOZA 18 MG/3ML SOPN INJECT 0.6 MG SUBCUTANEOUSLY ONCE DAILY -- TITRATE UP TO 1.8 MG DAILY 9 mL 2    Objective: BP 177/108 mmHg  Pulse 108  Temp(Src) 98.8 F (37.1 C) (Oral)  Resp 18  SpO2 98%  LMP 02/05/2015 Exam: General: In NAD, obese, resting in bed, alert when awoken Eyes: PERRLA, non-icteric sclera ENTM: mildly dry mucous membranes, white leukoplakia appearing lesion on bottom right peridontal molar area Neck: supple, no lymphadenopathy Cardiovascular: regular rate and rhythm, normal s1 and s2, no murmurs. No edema. 2+ dorsalis pedis pulses bilaterally Respiratory: normal work of breathing, decreased breath sounds bilaterally likely from body habitus. No crackles/wheezes appreciated Abdomen: soft, obese abdomen, normal bowel sounds, tenderness to  palpation throughout, no masses palpated MSK: normal range of motion of all joints, moving extremities without difficulty Skin: No rashes, no bruising Neuro: no focal defecits, 5/5 strength in bilateral upper and lower extremities Psych: normal mood and affect  Labs and Imaging: CBC BMET   Recent Labs Lab 02/20/15 2136  WBC 10.7*  HGB 11.2*  HCT 33.9*  PLT 457*    Recent Labs Lab 02/20/15 2136  NA 138  K 3.5  CL 98*  CO2 28  BUN 28*  CREATININE 2.85*  GLUCOSE 146*  CALCIUM 10.6*     Dg Abd Acute W/chest  02/21/2015  CLINICAL DATA:  45 year old female with abdominal cramping. EXAM: DG  ABDOMEN ACUTE W/ 1V CHEST COMPARISON:  Radiograph dated 01/27/2011 FINDINGS: The lungs are clear. No pleural effusion or pneumothorax. The cardiac silhouette is within normal limits. Moderate stool throughout the colon. No evidence of bowel obstruction. No free air. No radiopaque calculi. The osseous structures appear unremarkable. IMPRESSION: No acute cardiopulmonary process. Constipation.  No evidence of bowel obstruction. Electronically Signed   By: Anner Crete M.D.   On: 02/21/2015 02:12    Carlyle Dolly, MD 02/21/2015, 2:59 AM PGY-1, [place] Intern pager: [phone], text pages welcome  I have read and agree with the amended note as above. Phill Myron, MD, PGY-3 7:19 AM

## 2015-02-21 NOTE — ED Notes (Signed)
Patient CBG was 205.

## 2015-02-22 LAB — GLUCOSE, CAPILLARY
GLUCOSE-CAPILLARY: 111 mg/dL — AB (ref 65–99)
GLUCOSE-CAPILLARY: 207 mg/dL — AB (ref 65–99)
GLUCOSE-CAPILLARY: 264 mg/dL — AB (ref 65–99)
Glucose-Capillary: 197 mg/dL — ABNORMAL HIGH (ref 65–99)

## 2015-02-22 LAB — BASIC METABOLIC PANEL
Anion gap: 11 (ref 5–15)
BUN: 18 mg/dL (ref 6–20)
CALCIUM: 9.4 mg/dL (ref 8.9–10.3)
CHLORIDE: 102 mmol/L (ref 101–111)
CO2: 28 mmol/L (ref 22–32)
Creatinine, Ser: 1.47 mg/dL — ABNORMAL HIGH (ref 0.44–1.00)
GFR, EST AFRICAN AMERICAN: 49 mL/min — AB (ref >60)
GFR, EST NON AFRICAN AMERICAN: 42 mL/min — AB (ref >60)
Glucose, Bld: 90 mg/dL (ref 65–99)
Potassium: 3.2 mmol/L — ABNORMAL LOW (ref 3.5–5.1)
Sodium: 141 mmol/L (ref 135–145)

## 2015-02-22 MED ORDER — POTASSIUM CHLORIDE CRYS ER 20 MEQ PO TBCR
40.0000 meq | EXTENDED_RELEASE_TABLET | Freq: Once | ORAL | Status: AC
  Administered 2015-02-22: 40 meq via ORAL
  Filled 2015-02-22: qty 2

## 2015-02-22 MED ORDER — CYCLOBENZAPRINE HCL 10 MG PO TABS
10.0000 mg | ORAL_TABLET | Freq: Once | ORAL | Status: AC
  Administered 2015-02-22: 10 mg via ORAL
  Filled 2015-02-22: qty 1

## 2015-02-22 MED ORDER — METOPROLOL TARTRATE 25 MG PO TABS: 25.0000 mg | ORAL_TABLET | Freq: Two times a day (BID) | ORAL | Status: DC

## 2015-02-22 NOTE — Progress Notes (Signed)
FPTS Interim Progress Note  S: Paged by RN to assess patient's abdominal pain.  Patient describes abdominal pain as menstrual cramping.  Small BM passed after soaps suds enema.  Denies nausea.    O: BP 163/109 mmHg  Pulse 106  Temp(Src) 98.4 F (36.9 C) (Oral)  Resp 20  SpO2 99%  LMP 02/05/2015  Gen: resting in prone position, NAD Abd: obese, soft, NT, full but not distended, +BS, no evidence of acute abdomen  A/P: Alicia West is a 45 y.o. female here for constipation with AKI.  No evidence of acute abdomen. Abdominal cramping is NORMAL.  Patient is being treated with medications which stimulate her bowel. - Cont IVF - Heating pads prn - Tylenol prn - Avoid opioids in the setting of constipation - Avoid NSAIDs in the setting of AKI. - AM labs as scheduled.  Janora Norlander, DO 02/22/2015, 12:51 AM PGY-2, [place] Medicine Service pager [phone]

## 2015-02-22 NOTE — Clinical Documentation Improvement (Addendum)
Family Medicine  Could the diagnosis of "essential hypertension" be further specified, if appropriate for this admission?  Thank you     Hypertensive crisis  Hypertensive urgency  Hypertensive emergency  Other  Clinically Undetermined  Supporting Information: Headaches w bp 189/120 on admit   Treatment: Continued home dose po Lopressor    Please exercise your independent, professional judgment when responding. A specific answer is not anticipated or expected.   Thank You, [place] [phone]

## 2015-02-22 NOTE — Progress Notes (Signed)
  Family Medicine Teaching Service Daily Progress Note Intern Pager: [phone]  Patient name: KRITHI BRAY Medical record number: [id number] Date of birth: 1970/06/01 Age: 45 y.o. Gender: female  Primary Care Provider: Annabell Sabal, MD Consultants: none Code Status: Full  Pt Overview and Major Events to Date:  1/16: Admitted to inpatient FPTS  Assessment and Plan: ERYNNE KEALEY is a 46 y.o. female presenting with nausea/vomiting/abdominal pain/constipation with AKI. PMH is significant for Hypertension and Type 2 DM.   Nausea/vomiting/abdominal pain/constipation: Nausea/vomiting resolved. Abdominal pain mostly resolved. Taking good PO. Had enema last night which provided some relief.  - Zofran PRN for nausea/vomiting - Bowel regimen for constipation: Miralax, Colace, and soap suds enema - DC mIVF as she is taking good PO - 1 more soap suds enema this morning - Will need bowel regimen on discharge  AKI: Creatine of 2.85 on admission. Cr down trending at 1.47 now after IVF.  - DC mIVF as above - AM BMP - Continue to hold home Metformin  Hypertension: Normal this AM - Continue Metoprolol - Vitals per protocol - Will add PRN Hydralazine if BP rises again  DM Type 2: Uncontrolled. Last Hgb A1C 9.7 on 10/26/14. Takes Lantus 40 units in AM, Metformin 500 mg BID, and Victoza at home.  - Continue Lantus 40 units in AM - Moderate SSI - CBGs - Hgb A1C  FEN/GI: SLIV, carb modified diet Prophylaxis: Heparin  Disposition: Home likely this afternoon  Subjective:  - Pt had enema last night and states that she had some watery stool as well as "hard pieces" - Abdominal pain still present but greatly improved and controlled with Tylenol - Nausea and vomiting improved, eating full meals and has good appetite  Objective: Temp:  [98.4 F (36.9 C)-98.6 F (37 C)] 98.4 F (36.9 C) (01/17 0416) Pulse Rate:  [86-106] 96 (01/17 0416) Resp:  [14-20] 17 (01/17 0416) BP:  (140-172)/(41-109) 144/85 mmHg (01/17 0416) SpO2:  [95 %-100 %] 95 % (01/17 0416) Physical Exam: General: In NAD, sitting up in bed talking on the phone Cardiovascular: regular rate and rhythm, normal s1 and s2, no murmurs Respiratory: no increased work of breathing, clear to auscultation bilaterally Abdomen: soft, obese abdomen, mildly tender to palpation in all four quadrants, no masses palpated, normal bowel sounds Extremities: no edema  Laboratory:  Recent Labs Lab 02/20/15 2136 02/21/15 0535  WBC 10.7* 10.2  HGB 11.2* 10.0*  HCT 33.9* 30.9*  PLT 457* 400    Recent Labs Lab 02/20/15 2136 02/21/15 0535 02/22/15 0652  NA 138 142 141  K 3.5 3.7 3.2*  CL 98* 104 102  CO2 28 29 28   BUN 28* 24* 18  CREATININE 2.85* 2.43* 1.47*  CALCIUM 10.6* 9.8 9.4  PROT 8.1  --   --   BILITOT 1.0  --   --   ALKPHOS 95  --   --   ALT 13*  --   --   AST 14*  --   --   GLUCOSE 146* 153* 90     Imaging/Diagnostic Tests: No results found.   Carlyle Dolly, MD 02/22/2015, 8:30 AM PGY-1, [place] Intern pager: [phone], text pages welcome

## 2015-02-22 NOTE — Progress Notes (Signed)
Gave soap suds enema to pt. Pt able to have large bowel movement following, however, still complains of some cramping and feeling like she "still has more stool that needs to come out."

## 2015-02-23 ENCOUNTER — Inpatient Hospital Stay (HOSPITAL_COMMUNITY): Payer: BLUE CROSS/BLUE SHIELD

## 2015-02-23 LAB — BASIC METABOLIC PANEL
ANION GAP: 7 (ref 5–15)
BUN: 13 mg/dL (ref 6–20)
CALCIUM: 9.4 mg/dL (ref 8.9–10.3)
CHLORIDE: 107 mmol/L (ref 101–111)
CO2: 28 mmol/L (ref 22–32)
CREATININE: 1.17 mg/dL — AB (ref 0.44–1.00)
GFR calc non Af Amer: 55 mL/min — ABNORMAL LOW (ref >60)
Glucose, Bld: 111 mg/dL — ABNORMAL HIGH (ref 65–99)
Potassium: 3.4 mmol/L — ABNORMAL LOW (ref 3.5–5.1)
SODIUM: 142 mmol/L (ref 135–145)

## 2015-02-23 LAB — GLUCOSE, CAPILLARY
GLUCOSE-CAPILLARY: 150 mg/dL — AB (ref 65–99)
GLUCOSE-CAPILLARY: 164 mg/dL — AB (ref 65–99)
Glucose-Capillary: 100 mg/dL — ABNORMAL HIGH (ref 65–99)
Glucose-Capillary: 279 mg/dL — ABNORMAL HIGH (ref 65–99)

## 2015-02-23 IMAGING — DX DG ABDOMEN 1V
1 series · 1 of 1 positions shown · non-contrast
Comparison: [DATE]

CLINICAL DATA: Constipation

EXAM:
ABDOMEN - 1 VIEW

[abdomen kub]
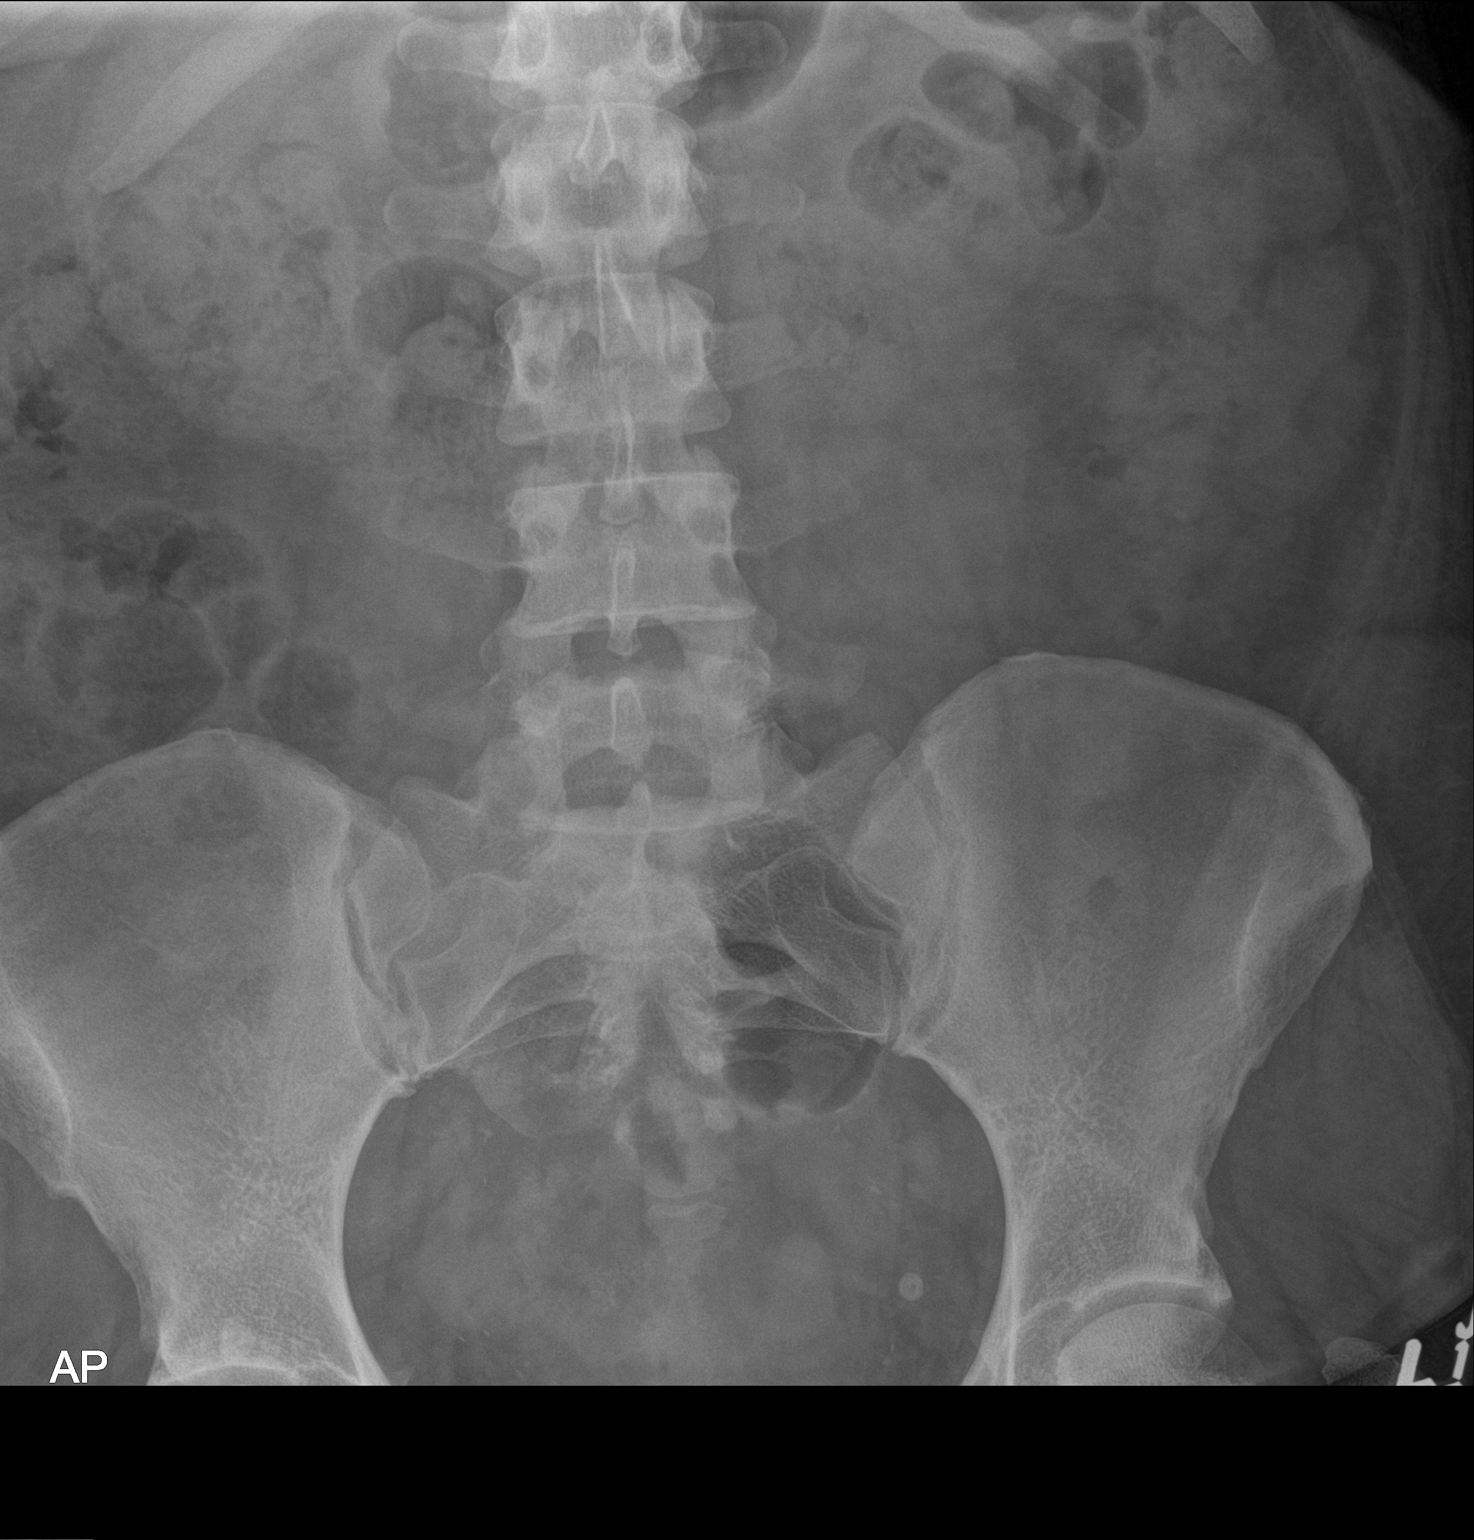

[1 of 1 positions shown; findings below may reference images not displayed]

FINDINGS: Stool retention similar to prior study.  Pelvic phleboliths noted.
IMPRESSION: No change from prior study

## 2015-02-23 MED ORDER — SENNA 8.6 MG PO TABS: 1.0000 | ORAL_TABLET | Freq: Every day | ORAL | Status: DC

## 2015-02-23 MED ORDER — MAGNESIUM CITRATE PO SOLN
1.0000 | Freq: Once | ORAL | Status: AC
  Administered 2015-02-23: 1 via ORAL
  Filled 2015-02-23: qty 296

## 2015-02-23 MED ORDER — DICYCLOMINE HCL 10 MG PO CAPS: 10.0000 mg | ORAL_CAPSULE | Freq: Three times a day (TID) | ORAL | Status: DC

## 2015-02-23 MED ORDER — POLYETHYLENE GLYCOL 3350 17 G PO PACK: 17.0000 g | PACK | Freq: Every day | ORAL | Status: DC

## 2015-02-23 MED ORDER — DICYCLOMINE HCL 10 MG PO CAPS
10.0000 mg | ORAL_CAPSULE | Freq: Three times a day (TID) | ORAL | Status: DC
  Administered 2015-02-23: 10 mg via ORAL
  Filled 2015-02-23: qty 1

## 2015-02-23 MED ORDER — POTASSIUM CHLORIDE CRYS ER 20 MEQ PO TBCR
40.0000 meq | EXTENDED_RELEASE_TABLET | Freq: Once | ORAL | Status: AC
  Administered 2015-02-23: 40 meq via ORAL
  Filled 2015-02-23: qty 2

## 2015-02-23 NOTE — Progress Notes (Signed)
02/22/15 21:25 Patient complaining of abdominal cramping,can only have tylenol and it's not due yet,requests for muscle relaxant or sleeping pill. MD on call notified of abd cramping and pt's request for muscle relaxant or sleeping pill. Per MD,patient will be having cramping because she is on senokot to help her move her bowel and cannot have narcotic because of her constipation.Will continue to monitor. Inayah Woodin, Wonda Cheng, Therapist, sports

## 2015-02-23 NOTE — Progress Notes (Signed)
Late Entry   02/22/15 1830   Called to pt's room for complaints of abdominal cramping. Pt received tylenol at 1733. Explained to pt as well as pt's daughter, who was present in room, that pt could not receive narcotics as that would further exacerbate constipation and that cramping was normal because pt was being given medications that stimulate her bowels. Pt verbalized understanding. Provided pt with two heating pads.

## 2015-02-23 NOTE — Progress Notes (Signed)
Patient refused tylenol for her abdominal pain,states "you can take that away,it doesn't do anything". When asked if the MD discussed with her why she can't have narcotics,patient answered "Yes". Jorel Gravlin, Wonda Cheng, Therapist, sports

## 2015-02-23 NOTE — Progress Notes (Signed)
Administered soap suds enema. Pt had two large BMs. States that she "feels better and less distended."

## 2015-02-23 NOTE — Progress Notes (Signed)
Spoke with patient to discuss patients care concerns. Patient reported that she continues to have severe cramping and abdominal pain. Also reports feeling like her abdomen is "swollen". Patient reports that she doesn't feel like she is ready for discharge.  Reviewed progress notes and discussed with patient MD plan for discharge due to patient having successful bowel movements following enemas. Patient reports feeling like "this is different, and there is something else wrong".   Also spoke with patients daughter via phone who verbalized similar concerns and requests additional scans be completed prior to patient being discharged.   Called and spoke with Dr. Juanito Doom to make aware of the above concerns. Dr/ Juanito Doom will discuss with team and follow up with patient later.   Patient informed, and thankful for follow up.  Brigett Estell, Bryn Gulling

## 2015-02-23 NOTE — Progress Notes (Signed)
Provided pt with discharge instructions including information on follow up appointments as well as new medications. Pt verbalized understanding of all information. IV was dc'd without complication. Pt stated she had a third BM. Awaiting dinner tray before she leaves.

## 2015-02-23 NOTE — Discharge Instructions (Signed)
You were admitted for severe constipation with nausea, vomiting and inability to stay hydrated.  You were treated with multiple enemas and stool softeners.  Abdominal xrays were obtained which reflected your constipation.  There was no evidence of bowel perforation or other pathologic processes on imaging.  It will likely take a couple of days to have a full bowel clean out.  Home instructions are provided below to complete this course.  If you start vomiting or cannot keep fluids down, please call the office or go to the emergency department for evaluation.  1. Your symptoms are consistent with Constipation, likely cause of your General Abdominal Pain / Cramping. 2. Start with Miralax sent to pharmacy. First dose 68g (4 capfuls) in 32oz water over 1 to 2 hours for clean out. Next day start 17g or 1 capful daily, may adjust dose up or down by half a capful every few days. Recommend to take this medicine daily for next 1-2 weeks, then may need to use it longer if needed. - Goal is to have soft regular bowel movement 1-3x daily, if too runny or diarrhea, then reduce dose of the medicine  Improve water intake, hydration will help Also recommend increased vegetables, fruits, fiber intake Can try daily Metamucil or Fiber supplement at pharmacy over the counter  Follow-up if symptoms are not improving with bowel movements, or if pain worsens, develop fevers, nausea, vomiting.  If you have any other questions or concerns, please feel free to call the clinic to contact your PCP. You may also schedule an earlier appointment if necessary.  However, if your symptoms get significantly worse, please go to the Emergency Department to seek immediate medical attention.   Constipation, Adult Constipation is when a person has fewer than three bowel movements a week, has difficulty having a bowel movement, or has stools that are dry, hard, or larger than normal. As people grow older, constipation is more common. A  low-fiber diet, not taking in enough fluids, and taking certain medicines may make constipation worse.  CAUSES   Certain medicines, such as antidepressants, pain medicine, iron supplements, antacids, and water pills.   Certain diseases, such as diabetes, irritable bowel syndrome (IBS), thyroid disease, or depression.   Not drinking enough water.   Not eating enough fiber-rich foods.   Stress or travel.   Lack of physical activity or exercise.   Ignoring the urge to have a bowel movement.   Using laxatives too much.  SIGNS AND SYMPTOMS   Having fewer than three bowel movements a week.   Straining to have a bowel movement.   Having stools that are hard, dry, or larger than normal.   Feeling full or bloated.   Pain in the lower abdomen.   Not feeling relief after having a bowel movement.  DIAGNOSIS  Your health care provider will take a medical history and perform a physical exam. Further testing may be done for severe constipation. Some tests may include:  A barium enema X-ray to examine your rectum, colon, and, sometimes, your small intestine.   A sigmoidoscopy to examine your lower colon.   A colonoscopy to examine your entire colon. TREATMENT  Treatment will depend on the severity of your constipation and what is causing it. Some dietary treatments include drinking more fluids and eating more fiber-rich foods. Lifestyle treatments may include regular exercise. If these diet and lifestyle recommendations do not help, your health care provider may recommend taking over-the-counter laxative medicines to help you have bowel movements.  Prescription medicines may be prescribed if over-the-counter medicines do not work.  HOME CARE INSTRUCTIONS   Eat foods that have a lot of fiber, such as fruits, vegetables, whole grains, and beans.  Limit foods high in fat and processed sugars, such as french fries, hamburgers, cookies, candies, and soda.   A fiber  supplement may be added to your diet if you cannot get enough fiber from foods.   Drink enough fluids to keep your urine clear or pale yellow.   Exercise regularly or as directed by your health care provider.   Go to the restroom when you have the urge to go. Do not hold it.   Only take over-the-counter or prescription medicines as directed by your health care provider. Do not take other medicines for constipation without talking to your health care provider first.  [place] IF:   You have bright red blood in your stool.   Your constipation lasts for more than 4 days or gets worse.   You have abdominal or rectal pain.   You have thin, pencil-like stools.   You have unexplained weight loss. MAKE SURE YOU:   Understand these instructions.  Will watch your condition.  Will get help right away if you are not doing well or get worse.   This information is not intended to replace advice given to you by your health care provider. Make sure you discuss any questions you have with your health care provider.   Document Released: 10/21/2003 Document Revised: 02/12/2014 Document Reviewed: 11/03/2012 Elsevier Interactive Patient Education [employer].

## 2015-02-23 NOTE — Progress Notes (Signed)
  Family Medicine Teaching Service Daily Progress Note Intern Pager: [phone]  Patient name: Alicia West Medical record number: [id number] Date of birth: 06/24/1970 Age: 45 y.o. Gender: female  Primary Care Provider: Annabell Sabal, MD Consultants: none Code Status: Full  Pt Overview and Major Events to Date:  1/16: Admitted to inpatient FPTS  Assessment and Plan: LUVENA WENTLING is a 45 y.o. female presenting with nausea/vomiting/abdominal pain/constipation with AKI. PMH is significant for Hypertension and Type 2 DM.   Nausea/vomiting/abdominal pain/constipation: Nausea/vomiting resolved. Abdominal pain mostly resolved. Taking good PO. Had enema last night which provided some relief.  - Zofran PRN for nausea/vomiting - Bowel regimen for constipation: Miralax, Colace, and soap suds enema - DC mIVF as she is taking good PO - Repeat KUB this AM, may do another enema if continued stool retention - Will need bowel regimen on discharge: Miralax BID and Senna  AKI: Creatine of 2.85 on admission. Cr down trending at 1.17.  - DC mIVF as above - AM BMP - Continue to hold home Metformin  Hypertension: Normal this AM - Continue Metoprolol - Vitals per protocol - Will add PRN Hydralazine if BP rises again  DM Type 2: Uncontrolled. Last Hgb A1C 9.7 on 10/26/14. Takes Lantus 40 units in AM, Metformin 500 mg BID, and Victoza at home.  - Continue Lantus 40 units in AM - Moderate SSI - CBGs  FEN/GI: SLIV, carb modified diet Prophylaxis: Heparin  Disposition: Home likely this afternoon pending KUB results  Subjective:  - Pt's last BM was yesterday - Continues to endorse severe abdominal pain yesterday, slightly improved with Flexeril - Nausea and vomiting resolved, eating full meals and has good appetite  Objective: Temp:  [98 F (36.7 C)-98.5 F (36.9 C)] 98.5 F (36.9 C) (01/18 0556) Pulse Rate:  [94-114] 114 (01/18 0556) Resp:  [18-20] 20 (01/18 0556) BP: (127-179)/(85-97)  157/93 mmHg (01/18 0556) SpO2:  [92 %-100 %] 92 % (01/18 0556) Physical Exam: General: In NAD, sitting up in bed talking on the phone Cardiovascular: regular rate and rhythm, normal s1 and s2, no murmurs Respiratory: no increased work of breathing, clear to auscultation bilaterally Abdomen: soft, obese abdomen, non-tender to palpation in all four quadrants, no masses palpated, normal bowel sounds Extremities: no edema  Laboratory:  Recent Labs Lab 02/20/15 2136 02/21/15 0535  WBC 10.7* 10.2  HGB 11.2* 10.0*  HCT 33.9* 30.9*  PLT 457* 400    Recent Labs Lab 02/20/15 2136 02/21/15 0535 02/22/15 0652 02/23/15 0540  NA 138 142 141 142  K 3.5 3.7 3.2* 3.4*  CL 98* 104 102 107  CO2 28 29 28 28   BUN 28* 24* 18 13  CREATININE 2.85* 2.43* 1.47* 1.17*  CALCIUM 10.6* 9.8 9.4 9.4  PROT 8.1  --   --   --   BILITOT 1.0  --   --   --   ALKPHOS 95  --   --   --   ALT 13*  --   --   --   AST 14*  --   --   --   GLUCOSE 146* 153* 90 111*     Imaging/Diagnostic Tests: No results found.   Carlyle Dolly, MD 02/23/2015, 8:16 AM PGY-1, [place] Intern pager: [phone], text pages welcome

## 2015-02-24 NOTE — Discharge Summary (Signed)
[facility] Discharge Summary  Patient name: Alicia West Medical record number: [id number] Date of birth: 1970-03-24 Age: 44 y.o. Gender: female Date of Admission: 02/21/2015  Date of Discharge: 02/23/15 Admitting Physician: Robyne Peers, MD  Primary Care Provider: Annabell Sabal, MD Consultants: none  Indication for Hospitalization: AKI and abdominal pain  Discharge Diagnoses/Problem List:  Patient Active Problem List   Diagnosis Date Noted  . AKI (acute kidney injury) ([place]) 02/21/2015  . Abdominal pain 02/21/2015  . Constipation 02/21/2015  . Renal failure (ARF), acute on chronic (HCC)   . Chest pressure 12/08/2014  . Paroxysmal supraventricular tachycardia ([place]) 10/26/2014  . RUQ pain 03/11/2014  . Oral cancer ([place]) 10/01/2013  . Obesity (BMI 30-39.9) 05/01/2013  . Binge-eating disorder, moderate 03/17/2013  . Macular degeneration 03/17/2012  . Anxiety 01/10/2011  . Herpes labialis 09/06/2010  . Osgood-Schlatter's disease 07/20/2010  . DM (diabetes mellitus), type 2, uncontrolled ([place]) 10/18/2009  . POSITIVE PPD 10/18/2009  . HYPERLIPIDEMIA 12/01/2008  . ANEMIA, IRON DEFICIENCY, UNSPEC. 04/04/2006  . DEPRESSIVE DISORDER, NOS 04/04/2006  . HYPERTENSION, BENIGN SYSTEMIC 04/04/2006  . RHINITIS, ALLERGIC 04/04/2006  . Menorrhagia 04/04/2006   Disposition: home  Discharge Condition: stable  Discharge Exam: see previous progress note  Brief Hospital Course:  Alicia West is a 45 y.o. female who presented with nausea/vomiting/abdominal pain/constipation with AKI. PMH is significant for Hypertension and Type 2 DM.   Nausea/vomiting/abdominal pain/constipation:  Nausea/vomiting/abdominal pain likely secondary to acute worsening of chronic constipation. According to patient, she typically has 1 BM/week. Pt was afebrile without significant increase in WBC. Urine pregnancy test negative. KUB showed moderate constipation, no signs of bowel  obstruction. Differentials included gastroparesis secondary to uncontrolled diabetes, drug/medication side effect, or idiopathic cause. Was given Tylenol for pain and Zofran PRN for nausea/vomiting and given a bowel regimen for constipation: Miralax, Colace, and soap suds enema. Repeat KUB showed unchanged constipation, was given Mag Citrate and another enema. Additionally, a trial of Bentyl was added to help with abdominal cramping. On day of discharge, all symptoms had resolved and patient had multiple BMs. She was discharge on bowel regimen of Miralax BID and Senna  AKI:  Creatinine of 2.85 on admission, likely secondary to vomiting as Cr improved with IVF and improved PO intake. Home Metformin was held while in hospital for renal protection. On day of discharge, Creatinine improved to 1.17.   All other chronic medical conditions stable throughout admission and managed with home regimens.   Issues for Follow Up:  1. Constipation: Trial of Bentyl started in hospital to improve GI motility. Was also sent home with prescription for Miralax BID and Senna. Will need to monitor for improvement in constipation symptoms. May benefit from gastric emptying study to rule out gastroparesis.  2. AKI: Consider repeat BMP at follow up to ensure creatinine is back to baseline  Significant Procedures: none  Significant Labs and Imaging:   Recent Labs Lab 02/20/15 2136 02/21/15 0535  WBC 10.7* 10.2  HGB 11.2* 10.0*  HCT 33.9* 30.9*  PLT 457* 400    Recent Labs Lab 02/20/15 2136 02/21/15 0535 02/22/15 0652 02/23/15 0540  NA 138 142 141 142  K 3.5 3.7 3.2* 3.4*  CL 98* 104 102 107  CO2 28 29 28 28   GLUCOSE 146* 153* 90 111*  BUN 28* 24* 18 13  CREATININE 2.85* 2.43* 1.47* 1.17*  CALCIUM 10.6* 9.8 9.4 9.4  ALKPHOS 95  --   --   --  AST 14*  --   --   --   ALT 13*  --   --   --   ALBUMIN 3.5  --   --   --     Dg Abd Acute W/chest  02/21/2015  CLINICAL DATA:  45 year old female with  abdominal cramping. EXAM: DG ABDOMEN ACUTE W/ 1V CHEST COMPARISON:  Radiograph dated 01/27/2011 FINDINGS: The lungs are clear. No pleural effusion or pneumothorax. The cardiac silhouette is within normal limits. Moderate stool throughout the colon. No evidence of bowel obstruction. No free air. No radiopaque calculi. The osseous structures appear unremarkable. IMPRESSION: No acute cardiopulmonary process. Constipation.  No evidence of bowel obstruction. Electronically Signed   By: Anner Crete M.D.   On: 02/21/2015 02:12    Results/Tests Pending at Time of Discharge: none  Discharge Medications:    Medication List    TAKE these medications        aspirin 81 MG tablet  Take 81 mg by mouth daily.     atorvastatin 40 MG tablet  Commonly known as:  LIPITOR  Take 1 tablet (40 mg total) by mouth daily.     cetirizine 10 MG tablet  Commonly known as:  ZYRTEC  Take 10 mg by mouth daily as needed for allergies.     dicyclomine 10 MG capsule  Commonly known as:  BENTYL  Take 1 capsule (10 mg total) by mouth 4 (four) times daily -  before meals and at bedtime.     gabapentin 100 MG capsule  Commonly known as:  NEURONTIN  Take 1 capsule (100 mg total) by mouth at bedtime.     ibuprofen 800 MG tablet  Commonly known as:  ADVIL,MOTRIN  TAKE 1 TABLET BY MOUTH EVERY 8 HOURS AS NEEDED     Insulin Glargine 100 UNIT/ML Solostar Pen  Commonly known as:  LANTUS SOLOSTAR  Inject 40 Units into the skin daily.     losartan-hydrochlorothiazide 100-12.5 MG tablet  Commonly known as:  HYZAAR  TAKE 1 TABLET BY MOUTH DAILY.     metFORMIN 500 MG tablet  Commonly known as:  GLUCOPHAGE  Take 1 tablet (500 mg total) by mouth 2 (two) times daily with a meal.     metoprolol tartrate 25 MG tablet  Commonly known as:  LOPRESSOR  Take 1 tablet (25 mg total) by mouth 2 (two) times daily.     polyethylene glycol packet  Commonly known as:  MIRALAX / GLYCOLAX  Take 17 g by mouth daily.     senna 8.6  MG Tabs tablet  Commonly known as:  SENOKOT  Take 1 tablet (8.6 mg total) by mouth daily.     NOVOTWIST 32G X 5 MM Misc  Generic drug:  Insulin Pen Needle     ULTICARE MINI PEN NEEDLES 31G X 6 MM Misc  Generic drug:  Insulin Pen Needle  Use to inject insulin subQ once daily for diabetes mellitus type 2     VICTOZA 18 MG/3ML Sopn  Generic drug:  Liraglutide  INJECT 0.6 MG SUBCUTANEOUSLY ONCE DAILY -- TITRATE UP TO 1.8 MG DAILY        Discharge Instructions: Please refer to Patient Instructions section of EMR for full details.  Patient was counseled important signs and symptoms that should prompt return to medical care, changes in medications, dietary instructions, activity restrictions, and follow up appointments.   Follow-Up Appointments:     Follow-up Information    Follow up with Evette Doffing, MD.  Go on 03/01/2015.   Specialty:  Family Medicine   Why:  hospital follow up at 2:30 PM   Contact information:   [place] [address] [phone]       Carlyle Dolly, MD 02/24/2015, 6:00 PM PGY-1, [place]

## 2015-02-25 ENCOUNTER — Encounter: Payer: Self-pay | Admitting: Family Medicine

## 2015-02-25 LAB — GLUCOSE, CAPILLARY
GLUCOSE-CAPILLARY: 204 mg/dL — AB (ref 65–99)
GLUCOSE-CAPILLARY: 162 mg/dL — AB (ref 65–99)

## 2015-03-01 ENCOUNTER — Inpatient Hospital Stay: Payer: No Typology Code available for payment source | Admitting: Internal Medicine

## 2015-03-03 ENCOUNTER — Encounter: Payer: Self-pay | Admitting: Family Medicine

## 2015-03-08 ENCOUNTER — Other Ambulatory Visit: Payer: Self-pay | Admitting: *Deleted

## 2015-03-08 MED ORDER — INSULIN GLARGINE 100 UNIT/ML SOLOSTAR PEN: 40.0000 [IU] | PEN_INJECTOR | Freq: Every day | SUBCUTANEOUS | Status: DC

## 2015-03-22 ENCOUNTER — Telehealth: Payer: Self-pay | Admitting: Family Medicine

## 2015-03-22 NOTE — Telephone Encounter (Signed)
Pt would like to know if we could provide her with enough insulin samples to last her until March 1st when her insurance will become effective. Alicia West, ASA

## 2015-03-22 NOTE — Telephone Encounter (Signed)
Will forward to PCP.  We do not have any Lantus samples.  We have Levemir Flexpen and [place] FlexPen samples.  Think we have about 4-5 of each.   Derl Barrow, RN

## 2015-03-23 NOTE — Telephone Encounter (Signed)
Tried calling the home number, which is the wrong phone number listed.  Called cell number listed; left voice message for patient to call nurse back.  [facility] does not have any Lantus SoloStar Pens, however per [name] if patient ok to switch to Levemir Flexpen with is equal to Lantus.  We have a sample she could have.  Derl Barrow, RN

## 2015-03-23 NOTE — Telephone Encounter (Signed)
OK to switch to Levemir on similar per-unit basis, meaning use the same number of units as her Lantus.

## 2015-03-29 NOTE — Telephone Encounter (Signed)
Pt would like a call back regarding when sample of insulin medication comes in.

## 2015-03-30 MED ORDER — INSULIN DETEMIR 100 UNIT/ML FLEXPEN: 40.0000 [IU] | PEN_INJECTOR | Freq: Every day | SUBCUTANEOUS | Status: DC

## 2015-03-30 NOTE — Telephone Encounter (Addendum)
Patient informed that there is a Levemir Sample that she can pick up.  Patient stated she will be at [facility] tomorrow morning to pick up medication.   Lot: [id number] EP: 04/2016 [place]: [id number]  Derl Barrow, RN

## 2015-04-13 ENCOUNTER — Telehealth: Payer: Self-pay | Admitting: Family Medicine

## 2015-04-13 NOTE — Telephone Encounter (Signed)
Patient having issues with her insurance covering her Lantus. Would like to be switched to Levemir bc it is cheaper. Please advise.

## 2015-04-14 MED ORDER — INSULIN DETEMIR 100 UNIT/ML FLEXPEN: 40.0000 [IU] | PEN_INJECTOR | Freq: Every day | SUBCUTANEOUS | Status: DC

## 2015-04-14 NOTE — Telephone Encounter (Signed)
I have switched the patient to Levemir.  She is to use the same amount (40 units) daily.  She should follow up with me in the next 2 weeks or so, so we can see how her blood sugars are doing.  Please call and let her know.  Thanks!  JW

## 2015-04-14 NOTE — Telephone Encounter (Signed)
Spoke to pt. She asked if she should still take the victoza with the Levemir?  She is waiting on the insurance to kick in. Will make an appt once she has verification. Ottis Stain, CMA

## 2015-04-15 NOTE — Telephone Encounter (Signed)
Sounds good.  Yes she should take the Victoza with the levemir, if she has any.  She may not have any until her insurance kicks in, but when she picks it up, she can use both of them.  Thanks, JW

## 2015-04-15 NOTE — Telephone Encounter (Signed)
PT informed of below. Katharina Caper, April D, [place]

## 2015-04-26 ENCOUNTER — Other Ambulatory Visit: Payer: Self-pay | Admitting: Family Medicine

## 2015-04-29 ENCOUNTER — Other Ambulatory Visit: Payer: Self-pay | Admitting: *Deleted

## 2015-04-29 MED ORDER — GLUCOSE BLOOD VI STRP: ORAL_STRIP | Status: DC

## 2015-05-02 ENCOUNTER — Telehealth: Payer: Self-pay | Admitting: *Deleted

## 2015-05-02 MED ORDER — LANCETS MISC: Status: DC

## 2015-05-02 MED ORDER — CONTOUR NEXT ONE KIT
1.0000 | PACK | Freq: Three times a day (TID) | Status: DC
Start: 2015-05-02 — End: 2017-05-09

## 2015-05-02 MED ORDER — GLUCOSE BLOOD VI STRP: 1.0000 | ORAL_STRIP | Freq: Three times a day (TID) | Status: DC

## 2015-05-02 NOTE — Telephone Encounter (Signed)
Received a fax from [employer] stating that patient's insurance will not cover Freestyle DM supplies.  They will cover Contour or Contour Next.  DM standardized form complete for Contour Next meter, test strips and lancets.  Test strips, and Meter added to med list.  Form signed by [name] and faxed back to [employer].  Derl Barrow, RN

## 2015-05-24 ENCOUNTER — Other Ambulatory Visit: Payer: Self-pay | Admitting: Family Medicine

## 2015-05-29 ENCOUNTER — Other Ambulatory Visit: Payer: Self-pay | Admitting: Family Medicine

## 2015-09-11 ENCOUNTER — Other Ambulatory Visit: Payer: Self-pay | Admitting: Family Medicine

## 2015-10-01 ENCOUNTER — Other Ambulatory Visit: Payer: Self-pay | Admitting: Family Medicine

## 2015-10-06 ENCOUNTER — Other Ambulatory Visit: Payer: Self-pay | Admitting: Family Medicine

## 2015-10-15 ENCOUNTER — Other Ambulatory Visit: Payer: Self-pay | Admitting: Family Medicine

## 2015-11-24 ENCOUNTER — Other Ambulatory Visit: Payer: Self-pay | Admitting: Family Medicine

## 2015-12-03 ENCOUNTER — Other Ambulatory Visit: Payer: Self-pay | Admitting: Family Medicine

## 2015-12-16 ENCOUNTER — Other Ambulatory Visit: Payer: Self-pay | Admitting: Family Medicine

## 2015-12-19 NOTE — Telephone Encounter (Signed)
Pt is calling because she is need of her BP medication Metoprolol. Please call this ASAP. jw

## 2015-12-21 ENCOUNTER — Other Ambulatory Visit: Payer: Self-pay | Admitting: Family Medicine

## 2016-01-09 ENCOUNTER — Other Ambulatory Visit: Payer: Self-pay | Admitting: Family Medicine

## 2016-01-12 ENCOUNTER — Other Ambulatory Visit: Payer: Self-pay | Admitting: Family Medicine

## 2016-01-25 ENCOUNTER — Ambulatory Visit (INDEPENDENT_AMBULATORY_CARE_PROVIDER_SITE_OTHER): Payer: BLUE CROSS/BLUE SHIELD | Admitting: Internal Medicine

## 2016-01-25 ENCOUNTER — Encounter: Payer: Self-pay | Admitting: Internal Medicine

## 2016-01-25 VITALS — BP 180/102 | HR 95 | Temp 98.9°F | Ht 69.0 in | Wt 244.4 lb

## 2016-01-25 DIAGNOSIS — Z794 Long term (current) use of insulin: Secondary | ICD-10-CM

## 2016-01-25 DIAGNOSIS — E118 Type 2 diabetes mellitus with unspecified complications: Secondary | ICD-10-CM | POA: Diagnosis not present

## 2016-01-25 DIAGNOSIS — K5900 Constipation, unspecified: Secondary | ICD-10-CM | POA: Diagnosis not present

## 2016-01-25 DIAGNOSIS — E1165 Type 2 diabetes mellitus with hyperglycemia: Secondary | ICD-10-CM | POA: Diagnosis not present

## 2016-01-25 DIAGNOSIS — IMO0002 Reserved for concepts with insufficient information to code with codable children: Secondary | ICD-10-CM

## 2016-01-25 DIAGNOSIS — R1084 Generalized abdominal pain: Secondary | ICD-10-CM | POA: Diagnosis not present

## 2016-01-25 DIAGNOSIS — R109 Unspecified abdominal pain: Secondary | ICD-10-CM

## 2016-01-25 LAB — COMPLETE METABOLIC PANEL WITH GFR
ALBUMIN: 3.8 g/dL (ref 3.6–5.1)
ALK PHOS: 91 U/L (ref 33–115)
ALT: 14 U/L (ref 6–29)
AST: 13 U/L (ref 10–35)
BILIRUBIN TOTAL: 0.3 mg/dL (ref 0.2–1.2)
BUN: 15 mg/dL (ref 7–25)
CALCIUM: 9.9 mg/dL (ref 8.6–10.2)
CO2: 28 mmol/L (ref 20–31)
CREATININE: 0.99 mg/dL (ref 0.50–1.10)
Chloride: 100 mmol/L (ref 98–110)
GFR, Est African American: 79 mL/min (ref >=60)
GFR, Est Non African American: 69 mL/min (ref >=60)
Glucose, Bld: 235 mg/dL — ABNORMAL HIGH (ref 65–99)
Potassium: 3.9 mmol/L (ref 3.5–5.3)
Sodium: 138 mmol/L (ref 135–146)
TOTAL PROTEIN: 7.2 g/dL (ref 6.1–8.1)

## 2016-01-25 LAB — POCT URINALYSIS DIPSTICK
Bilirubin, UA: NEGATIVE
Glucose, UA: 500
Ketones, UA: NEGATIVE
Leukocytes, UA: NEGATIVE
Nitrite, UA: NEGATIVE
PROTEIN UA: 30
RBC UA: NEGATIVE
SPEC GRAV UA: 1.02
UROBILINOGEN UA: 0.2
pH, UA: 6

## 2016-01-25 LAB — POCT UA - MICROSCOPIC ONLY

## 2016-01-25 LAB — POCT GLYCOSYLATED HEMOGLOBIN (HGB A1C): HEMOGLOBIN A1C: 9.5

## 2016-01-25 LAB — TSH: TSH: 2.56 m[IU]/L

## 2016-01-25 MED ORDER — INSULIN DETEMIR 100 UNIT/ML FLEXPEN: 50.0000 [IU] | PEN_INJECTOR | Freq: Every day | SUBCUTANEOUS | 1 refills | Status: DC

## 2016-01-25 MED ORDER — BISACODYL 5 MG PO TBEC: 5.0000 mg | DELAYED_RELEASE_TABLET | Freq: Every day | ORAL | 0 refills | Status: DC | PRN

## 2016-01-25 NOTE — Patient Instructions (Addendum)
Alicia West,  Increase levemir to 50 units daily. Please follow-up in about a month to discuss changing your insulin regimen.  Continue tylenol as needed for back pain.  Try ducolax, which I have prescribed, instead of the senna for constipation. You may want to try glycerin suppositories instead of the fleets, as fleets can affect your electrolytes.  I will call you with your lab results. You will hear about the gynecology referral in about 1 week.  Best, Dr. Ola Spurr

## 2016-01-25 NOTE — Progress Notes (Signed)
[facility] Progress Note  Subjective:  Alicia West is a 45 y.o. female who presents for SDA with concern for chronic R flank pain and concerns about her kidneys. She has been having abdominal pain over the last couple of weeks that can cause her to double up and have bad cramping. She struggles with constipation. She says she has a BM about 3 times a week, and that they are usually hard. She takes miralax and occasionally uses fleets enemas. She has been told she has fibroids but denies heavy periods or regular menstrual cramps. She is concerned victoza could be causing her discomfort because she says she has not been able to drink much water and having decreased appetite since starting it last year. Other symptoms she associates with victoza are SOB, weakness, and nausea. Blood sugars usually run about 160 in the mornings and 235 in the afternoons at the highest. She is only on 500 mg metformin because higher doses caused too much abdominal discomfort. She did run out of diabetes medication in October. Is supposed to be taking 1.8 mg victoza but has only been able to tolerate 1.2 mg. ROS: No fevers. May have some slight dysuria.   Chief Complaint  Patient presents with  . Pelvic Pain  . Back Pain    Past Medical History:  Diagnosis Date  . Diabetes mellitus   . Herpes simplex labialis   . Hypertension   . TB (tuberculosis) contact    + skin test,/- chest x-ray - in mid 2000's   Social: Never smoker  Objective: Blood pressure (!) 180/102, pulse 95, temperature 98.9 F (37.2 C), temperature source Oral, height 5' 9"  (1.753 m), weight 244 lb 6.4 oz (110.9 kg), last menstrual period 01/10/2016. Body mass index is 36.09 kg/m. Constitutional: Obese female, in NAD Cardiovascular: RRR, S1, S2, no m/r/g.  Pulmonary/Chest: Effort normal and breath sounds normal. No respiratory distress.  Abdominal: Soft. +BS, NT, slightly distended but not hyper-resonant to percussion, no  rebound or guarding.  Musculoskeletal: Mild TTP over R paraspinal muscles. No midline spinal tenderness. No CVA tenderness.  Skin: Skin is warm and dry. No rash noted.  Psychiatric: Anxious affect. Vitals reviewed  Assessment/Plan: Abdominal pain - Chronic issue with various possible etiologies, including constipation given minimal water intake, medication side effect, fibroid pain (though less likely given lack of symptoms with menstrual cycle), and gastroparesis given poorly controlled diabetes, and UTI given flank pain - Suggested trying dulcolax as a stimulant laxative since unable to tolerate increased water intake. Recommended trying glycerin suppositories instead of fleets, given history of AKI - Will check CMP for SCr, Ca and LFTs. - Patient requests check of TSH for fatigue and constipation and family history. Will perform though has been negative in years prior - Patient requests gyn referral for second opinion on fibroids, though counseled that her abdominal complaints are likely not due to fibroids since pain is not cyclical and largest fibroid 3.6 cm on transvaginal U/S 03/2014 - UA without evidence of UTI - Diabetes poorly controlled with A1c of 9.5 today, was 9.7 in Sept 2016; could be suffering from gastroparesis   Follow-up in about 1 month to discuss changing diabetes medication regimen further. For now, recommended increasing levemir from 40 units daily to 50 units. Suspect will need to increase to at least 60. Believe patient would benefit most from extended discussion about control of diabetes. Would consider further discussion about gastroparesis at that time. Could try metoclopramide or erythromycin.  Olene Floss, MD [place], PGY-2

## 2016-01-26 NOTE — Assessment & Plan Note (Signed)
-   Chronic issue with various possible etiologies, including constipation given minimal water intake, medication side effect, fibroid pain (though less likely given lack of symptoms with menstrual cycle), and gastroparesis given poorly controlled diabetes, and UTI given flank pain - Suggested trying dulcolax as a stimulant laxative since unable to tolerate increased water intake. Recommended trying glycerin suppositories instead of fleets, given history of AKI - Will check CMP for SCr, Ca and LFTs. - Patient requests check of TSH for fatigue and constipation and family history. Will perform though has been negative in years prior - Patient requests gyn referral for second opinion on fibroids, though counseled that her abdominal complaints are likely not due to fibroids since pain is not cyclical and largest fibroid 3.6 cm on transvaginal U/S 03/2014 - UA without evidence of UTI - Diabetes poorly controlled with A1c of 9.5 today, was 9.7 in Sept 2016; could be suffering from gastroparesis

## 2016-02-10 ENCOUNTER — Other Ambulatory Visit: Payer: Self-pay | Admitting: *Deleted

## 2016-02-10 MED ORDER — METFORMIN HCL 500 MG PO TABS: 500.0000 mg | ORAL_TABLET | Freq: Two times a day (BID) | ORAL | 5 refills | Status: DC

## 2016-02-10 NOTE — Telephone Encounter (Signed)
Refill request for 90 day supply.  Martin, Tamika L, RN  

## 2016-02-24 ENCOUNTER — Other Ambulatory Visit: Payer: Self-pay | Admitting: Family Medicine

## 2016-03-13 ENCOUNTER — Ambulatory Visit: Payer: No Typology Code available for payment source | Admitting: Gynecology

## 2016-03-26 ENCOUNTER — Ambulatory Visit: Payer: No Typology Code available for payment source | Admitting: Gynecology

## 2016-03-30 ENCOUNTER — Ambulatory Visit: Payer: BLUE CROSS/BLUE SHIELD | Admitting: Family Medicine

## 2016-04-04 ENCOUNTER — Encounter: Payer: Self-pay | Admitting: Family Medicine

## 2016-04-04 ENCOUNTER — Ambulatory Visit (INDEPENDENT_AMBULATORY_CARE_PROVIDER_SITE_OTHER): Payer: BLUE CROSS/BLUE SHIELD | Admitting: Family Medicine

## 2016-04-04 ENCOUNTER — Telehealth: Payer: Self-pay | Admitting: *Deleted

## 2016-04-04 VITALS — BP 164/96 | HR 104 | Temp 98.0°F | Ht 69.0 in | Wt 244.8 lb

## 2016-04-04 DIAGNOSIS — E118 Type 2 diabetes mellitus with unspecified complications: Secondary | ICD-10-CM

## 2016-04-04 DIAGNOSIS — I10 Essential (primary) hypertension: Secondary | ICD-10-CM

## 2016-04-04 DIAGNOSIS — E1165 Type 2 diabetes mellitus with hyperglycemia: Secondary | ICD-10-CM | POA: Diagnosis not present

## 2016-04-04 DIAGNOSIS — N898 Other specified noninflammatory disorders of vagina: Secondary | ICD-10-CM | POA: Diagnosis not present

## 2016-04-04 DIAGNOSIS — IMO0002 Reserved for concepts with insufficient information to code with codable children: Secondary | ICD-10-CM

## 2016-04-04 DIAGNOSIS — Z794 Long term (current) use of insulin: Secondary | ICD-10-CM

## 2016-04-04 LAB — POCT WET PREP (WET MOUNT)
CLUE CELLS WET PREP WHIFF POC: NEGATIVE
Trichomonas Wet Prep HPF POC: ABSENT

## 2016-04-04 MED ORDER — METOPROLOL TARTRATE 25 MG PO TABS: 50.0000 mg | ORAL_TABLET | Freq: Two times a day (BID) | ORAL | 6 refills | Status: DC

## 2016-04-04 NOTE — Assessment & Plan Note (Signed)
Currently nearly maxed out on hyzaar (could increase to 142m-25mg rather than 100-12.594m, and have more room to titrate betablocker. Patient cannot tolerate norvasc due to BLE swelling and had a cough with ACEi. BP poorly controlled today despite patient taking meds. Increased metoprolol to 5079mID and will have her f/u with PCP later this month. Curious if secondary causes for HTN should be explored as patient is fairly young for difficult to control HTN.

## 2016-04-04 NOTE — Progress Notes (Signed)
   CC: BP and boils   HPI CBGs - high 100s. Taking levemir 50U daily, victoza currently at 0.611m  As patient experienced nausea and vomiting with higher doses. Denies hypoglycemic events.   BP - checks at home, typically high 160s/100s. Took both metoprolol and hyzaar this morning. Has not been eating much salt as she feels nauseous, but has been drinking glucerna up to BID.  Boils -noticed these starting years ago. Had two last week - one in pubic hair, and one around rectum. Noted that these drain pus after taking a warm bath. No fevers or systemic symptoms. Tried vaseline. Has also noted some gray vaginal discharge, which she reports goes away after she takes a shower.  CC, SH/smoking status, and VS noted  Objective: BP (!) 164/96   Pulse (!) 104   Temp 98 F (36.7 C) (Oral)   Ht 5' 9"  (1.753 m)   Wt 244 lb 12.8 oz (111 kg)   SpO2 98%   BMI 36.15 kg/m  Gen: NAD, alert, cooperative, and pleasant. HEENT: NCAT, EOMI, PERRL CV: RRR, no murmur Resp: CTAB, no wheezes, non-labored Abd: SNTND, BS present, no guarding or organomegaly GU: two healing hyperpigmented lesions - one over right mons, one near rectum. No drainage or erythema. Cervix well visualized with some thin white discharge.  Ext: No edema, warm Neuro: Alert and oriented, Speech clear, No gross deficits  Assessment and plan:  DM (diabetes mellitus), type 2, uncontrolled ([place] Could not fully explore this today due to BP and vaginal concerns. Made appt with Dr. KValentina Lucksfor later this month at which time she will be due for repeat HgbA1c.   HYPERTENSION, BENIGN SYSTEMIC Currently nearly maxed out on hyzaar (could increase to 1016m211mg rather than 100-12.11m32m and have more room to titrate betablocker. Patient cannot tolerate norvasc due to BLE swelling and had a cough with ACEi. BP poorly controlled today despite patient taking meds. Increased metoprolol to 58m44mD and will have her f/u with PCP later this month. Curious  if secondary causes for HTN should be explored as patient is fairly young for difficult to control HTN.  Vaginal discharge Patient notes grey vaginal discharge for several days that goes away after she showers and scrubs the external area. Wet prep performed and noted to be normal. Healing lesions on mons and near rectum appear to have been pustules which have drained and healed. No signs of acute infection in these areas at present.   Orders Placed This Encounter  Procedures  . POCT [facility] encounter  Medications  . DISCONTD: metoprolol tartrate (LOPRESSOR) 25 MG tablet    Sig: Take 2 tablets (50 mg total) by mouth 2 (two) times daily.    Dispense:  60 tablet    Refill:  6     KateRalene Ok, PGY1 04/04/2016 4:46 PM

## 2016-04-04 NOTE — Patient Instructions (Signed)
It was a pleasure to see you today! Thank you for choosing [facility] for your primary care. Alicia West was seen for blood pressure and lesions. Come back to the clinic to check on your blood pressure with [name]. Increase your metoprolol to 81m twice per day. Additionally, I will call you if labs are abnormal.   Best,  Dr. TLindell Noe

## 2016-04-04 NOTE — Assessment & Plan Note (Signed)
Could not fully explore this today due to BP and vaginal concerns. Made appt with Dr. Valentina Lucks for later this month at which time she will be due for repeat HgbA1c.

## 2016-04-04 NOTE — Assessment & Plan Note (Signed)
Patient notes grey vaginal discharge for several days that goes away after she showers and scrubs the external area. Wet prep performed and noted to be normal. Healing lesions on mons and near rectum appear to have been pustules which have drained and healed. No signs of acute infection in these areas at present.

## 2016-04-04 NOTE — Telephone Encounter (Signed)
Received from Kristopher Oppenheim needing to change the quantity of Metoprolol 25 mg to #120 from #60. Please advise.  Derl Barrow, RN

## 2016-04-04 NOTE — Telephone Encounter (Signed)
Sorry, yes. Please change to #120.

## 2016-04-11 ENCOUNTER — Encounter: Payer: Self-pay | Admitting: Gynecology

## 2016-04-11 ENCOUNTER — Ambulatory Visit (INDEPENDENT_AMBULATORY_CARE_PROVIDER_SITE_OTHER): Payer: BLUE CROSS/BLUE SHIELD | Admitting: Gynecology

## 2016-04-11 VITALS — BP 132/84 | Ht 70.0 in | Wt 241.0 lb

## 2016-04-11 DIAGNOSIS — R3915 Urgency of urination: Secondary | ICD-10-CM

## 2016-04-11 DIAGNOSIS — G8929 Other chronic pain: Secondary | ICD-10-CM

## 2016-04-11 DIAGNOSIS — R1031 Right lower quadrant pain: Secondary | ICD-10-CM

## 2016-04-11 LAB — URINALYSIS W MICROSCOPIC + REFLEX CULTURE
BILIRUBIN URINE: NEGATIVE
Bacteria, UA: NONE SEEN [HPF]
Crystals: NONE SEEN [HPF]
Hgb urine dipstick: NEGATIVE
NITRITE: NEGATIVE
SPECIFIC GRAVITY, URINE: 1.03 (ref 1.001–1.035)
WBC UA: NONE SEEN WBC/HPF (ref <=5)
Yeast: NONE SEEN [HPF]
pH: 6 (ref 5.0–8.0)

## 2016-04-11 NOTE — Patient Instructions (Addendum)
Follow up for ultrasound as scheduled  Schedule your mammogram

## 2016-04-11 NOTE — Progress Notes (Signed)
Alicia West July 13, 1970 [id number]        46 y.o.  [id number] presents in referral from her primary physician in reference to questionable cystocele and chronic right lower quadrant pain. Has regular monthly menses. Sexually active without contraception. Notes right groin pain for a number of years that is exacerbated with movement. Feels superficial in the groin region. No radiation to the leg or back. No nausea vomiting diarrhea constipation. Had a pelvic ultrasound 2012 which showed several small myomas but uterus noted to be overall normal in size and no other pelvic pathology. Also notes pain with intercourse 1 episode like something being hit. Saw her physician who felt that she had a cystocele. No history of this before.  Lastly patient is also complaining of urinary urgency. Notes that when she has to go to the bathroom she has to run to make it there. Does go regular amounts. No dysuria or frequency. Also notes that she'll go 7-8 hours at work without voiding. Mammography 04/2014. Pap smear/HPV 05/2013. History of "abnormal" Pap smears years ago but normal Pap smears recently by her history.  Past medical history,surgical history, problem list, medications, allergies, family history and social history were all reviewed and documented as reviewed in the EPIC chart.  ROS:  Performed with pertinent positives and negatives included in the history, assessment and plan.   Additional significant findings :  None   Exam: Caryn Bee assistant Vitals:   04/11/16 1002  BP: 132/84  Weight: 241 lb (109.3 kg)  Height: 5' 10"  (1.778 m)   Body mass index is 34.58 kg/m.  General appearance:  Normal affect, orientation and appearance. Skin: Grossly normal HEENT: Without gross lesions.  No cervical or supraclavicular adenopathy. Thyroid normal.  Lungs:  Clear without wheezing, rales or rhonchi Cardiac: RR, without RMG Abdominal:  Soft, nontender, without masses, guarding, rebound, organomegaly  or hernia Breasts:  Examined lying and sitting without masses, retractions, discharge or axillary adenopathy. Pelvic:  Ext, BUS, Vagina: Normal with light menses flow. Patient tender right mons pubis area with palpation over the pubic bone. No evidence of hernia/bulging or any palpable abnormalities.  Cervix: Normal with light menses flow  Uterus: Retroverted, generous in size, midline and mobile nontender   Adnexa: Without masses or tenderness    Anus and perineum: Normal   Rectovaginal: Normal sphincter tone without palpated masses or tenderness.    Assessment/Plan:  46 y.o. [id number] female for annual exam with regular monthly menses, no contraception.   1. Right lower quadrant pain. Chronic in nature over the last several years. Worse when she moves certain ways or lifts her leg. Some radiation into her leg occasionally. Exam without evidence of hernia or other pathology. Question whether orthopedic in nature as it does exacerbate with movement. Will start with ultrasound to rule out GYN pathology and probably referred to orthopedics if normal. She does have a history of leiomyoma small on prior ultrasound. Uterus generous in size which may be past pregnancy related. 2. Questionable cystocele on prior exam. No evidence of this today. Bladder appears well supported. 3. Urinary urgency. Check urinalysis today. Chronic in nature. Discussed overactive bladder and behavior modification such as decreased caffeine, dietary changes, frequent voiding discussed. Possible OAB medication also reviewed. Patient not interested in this at this point. 4. Mammography overdue with last mammogram 2016. Recommend patient schedule mammography now she agrees to do so. SBE monthly reviewed. 5. Pap smear/HPV 05/2013. No Pap smear done today. History of "abnormal Pap smears"  years ago with normal Pap smears since. 6. Contraception. I reminded patient she is at risk for pregnancy not using contraception and I strongly  recommended she at least use condoms. Options for alternative more reliable contraception discussed and the patient is not interested. 7. General health maintenance. No routine lab work done as patient does this through her primary physician's office. Follow up for ultrasound as scheduled.   Anastasio Auerbach MD, 10:22 AM 04/11/2016

## 2016-04-12 LAB — URINE CULTURE

## 2016-04-17 ENCOUNTER — Other Ambulatory Visit: Payer: Self-pay | Admitting: Family Medicine

## 2016-04-17 MED ORDER — LIDOCAINE 5 % EX OINT: 1.0000 "application " | TOPICAL_OINTMENT | CUTANEOUS | 0 refills | Status: AC | PRN

## 2016-04-17 NOTE — Telephone Encounter (Signed)
Pt  calling to request refill of:  Name of Medication(s):  Liquid lidocaine  Last date of OV:  04-04-16 [facility]   Will route refill request to Clinic RN.  Discussed with patient policy to call pharmacy for future refills.  Also, discussed refills may take up to 48 hours to approve or deny.  Renella Cunas

## 2016-04-18 ENCOUNTER — Other Ambulatory Visit: Payer: BLUE CROSS/BLUE SHIELD

## 2016-04-18 ENCOUNTER — Ambulatory Visit (INDEPENDENT_AMBULATORY_CARE_PROVIDER_SITE_OTHER): Payer: BLUE CROSS/BLUE SHIELD | Admitting: Gynecology

## 2016-04-18 ENCOUNTER — Other Ambulatory Visit: Payer: Self-pay | Admitting: Gynecology

## 2016-04-18 ENCOUNTER — Ambulatory Visit (INDEPENDENT_AMBULATORY_CARE_PROVIDER_SITE_OTHER): Payer: BLUE CROSS/BLUE SHIELD

## 2016-04-18 ENCOUNTER — Ambulatory Visit: Payer: BLUE CROSS/BLUE SHIELD | Admitting: Gynecology

## 2016-04-18 DIAGNOSIS — D252 Subserosal leiomyoma of uterus: Secondary | ICD-10-CM

## 2016-04-18 DIAGNOSIS — G8929 Other chronic pain: Secondary | ICD-10-CM

## 2016-04-18 DIAGNOSIS — R1031 Right lower quadrant pain: Secondary | ICD-10-CM

## 2016-04-18 DIAGNOSIS — D251 Intramural leiomyoma of uterus: Secondary | ICD-10-CM

## 2016-04-18 DIAGNOSIS — A609 Anogenital herpesviral infection, unspecified: Secondary | ICD-10-CM

## 2016-04-18 DIAGNOSIS — D250 Submucous leiomyoma of uterus: Secondary | ICD-10-CM

## 2016-04-18 DIAGNOSIS — N831 Corpus luteum cyst of ovary, unspecified side: Secondary | ICD-10-CM | POA: Diagnosis not present

## 2016-04-18 MED ORDER — LIDOCAINE VISCOUS 2 % MT SOLN: 1.0000 mL | OROMUCOSAL | 1 refills | Status: DC | PRN

## 2016-04-18 MED ORDER — VALACYCLOVIR HCL 500 MG PO TABS: 500.0000 mg | ORAL_TABLET | Freq: Two times a day (BID) | ORAL | 2 refills | Status: DC

## 2016-04-18 NOTE — Progress Notes (Signed)
    Alicia West Oct 15, 1970 [id number]        46 y.o.  [id number] presents with her daughter for ultrasound. History of chronic right lower quadrant pain that we think is related to orthopedics as it is superficial in the groin exacerbated with movement. No evidence of hernia or other pathology on exam. She does have a past history of leiomyoma and her uterus palpated generous on her last pelvic exam. Patient also notes now she is having an active HSV outbreak. Asked if she could have lidocaine liquid to apply for pain. Notes that she will have an outbreak about every year or so. Lastly asked about her periods which are heavy with cramping and options for treatment for this.  Past medical history,surgical history, problem list, medications, allergies, family history and social history were all reviewed and documented in the EPIC chart.  Directed ROS with pertinent positives and negatives documented in the history of present illness/assessment and plan.  Exam: General appearance:  Normal  Ultrasound transvaginal and transabdominal shows uterus retroverted with multiple myomas measuring 53 mm, 33 mm, 34 mm, 24 mm. Endometrial echo 10.9 mm. Right and left ovaries visualized with physiologic changes. Cul-de-sac with fluid noted 34 x 10 mm.  Assessment/Plan:  46 y.o. [id number] with chronic right lower quadrant pain in the groin which I think is probably more orthopedic. No evidence of hernia on exam. Recommend patient follow up with her orthopedist for evaluation and she agrees to do so. Reviewed ultrasound with the patient. She does have multiple myomas but I do not think this is the source of her pain. Certainly may be the source of her heavier more crampy menses. Options for management to include follow up sonohysterogram rule out intracavitary abnormalities although no significant endometrial compromising on regular ultrasound by the myomas. Alternatives such as hormonal manipulation, Mirena IUD,  endometrial ablation up to including hysterectomy reviewed. I think a trial of Mirena IUD would certainly be worthwhile from a symptom relief standpoint and contraceptive standpoint. Patient is going to think about this and schedule as she chooses. Lastly I discussed her HSV outbreak. Lidocaine gel 2% 15 cc provided to apply topically and Valtrex 500 mg twice a day 5 days now. Additional 5 day course provided to have available if she has a recurrence with 2 refills. Proper use discussed with her.    Anastasio Auerbach MD, 11:25 AM 04/18/2016

## 2016-04-18 NOTE — Patient Instructions (Signed)
Follow up with the orthopedic doctor to see if the groin pain is related to your hip.  Follow up for the Mirena IUD if you choose to pursue this due to heavy bleeding and contraception.

## 2016-04-26 ENCOUNTER — Ambulatory Visit: Payer: BLUE CROSS/BLUE SHIELD | Admitting: Pharmacist

## 2016-05-04 ENCOUNTER — Other Ambulatory Visit: Payer: Self-pay | Admitting: Internal Medicine

## 2016-05-04 ENCOUNTER — Other Ambulatory Visit: Payer: Self-pay | Admitting: Family Medicine

## 2016-05-04 DIAGNOSIS — Z794 Long term (current) use of insulin: Principal | ICD-10-CM

## 2016-05-04 DIAGNOSIS — E118 Type 2 diabetes mellitus with unspecified complications: Principal | ICD-10-CM

## 2016-05-04 DIAGNOSIS — E1165 Type 2 diabetes mellitus with hyperglycemia: Secondary | ICD-10-CM

## 2016-05-04 DIAGNOSIS — IMO0002 Reserved for concepts with insufficient information to code with codable children: Secondary | ICD-10-CM

## 2016-06-04 ENCOUNTER — Other Ambulatory Visit: Payer: Self-pay | Admitting: Family Medicine

## 2016-06-07 ENCOUNTER — Other Ambulatory Visit: Payer: Self-pay | Admitting: *Deleted

## 2016-06-07 MED ORDER — METFORMIN HCL 500 MG PO TABS: 500.0000 mg | ORAL_TABLET | Freq: Two times a day (BID) | ORAL | 5 refills | Status: DC

## 2016-06-27 LAB — GLUCOSE, POCT (MANUAL RESULT ENTRY): POC Glucose: 194 mg/dl — AB (ref 70–99)

## 2016-08-07 ENCOUNTER — Telehealth: Payer: Self-pay | Admitting: Family Medicine

## 2016-08-07 NOTE — Telephone Encounter (Signed)
Pt would like a sample of lantus. She has enough for 2 more days.she has done an appeal to her insurance company and is waiting for an answer. Please advise

## 2016-08-07 NOTE — Telephone Encounter (Signed)
Please give Truciba samples 2 pens.  She is to take 40 units per day.  Please have her schedule an appointment to see one of our doctors here in the next week or so to see if she still needs this dose or if it needs to be changed.  I'll be out of town.    Thanks!  JW

## 2016-08-07 NOTE — Telephone Encounter (Signed)
Informed pt of below and she will be by on Thursday to pick this up.  The samples will be in a bag with pt name on them when she comes. Documented on sheet in sample bag already. Katharina Caper, April D, [place]

## 2016-08-27 ENCOUNTER — Encounter: Payer: Self-pay | Admitting: Family Medicine

## 2016-08-27 ENCOUNTER — Ambulatory Visit (INDEPENDENT_AMBULATORY_CARE_PROVIDER_SITE_OTHER): Payer: BLUE CROSS/BLUE SHIELD | Admitting: Family Medicine

## 2016-08-27 VITALS — BP 152/100 | HR 84 | Temp 98.6°F | Ht 69.0 in | Wt 246.0 lb

## 2016-08-27 DIAGNOSIS — IMO0002 Reserved for concepts with insufficient information to code with codable children: Secondary | ICD-10-CM

## 2016-08-27 DIAGNOSIS — E118 Type 2 diabetes mellitus with unspecified complications: Secondary | ICD-10-CM

## 2016-08-27 DIAGNOSIS — E1165 Type 2 diabetes mellitus with hyperglycemia: Secondary | ICD-10-CM | POA: Diagnosis not present

## 2016-08-27 DIAGNOSIS — Z794 Long term (current) use of insulin: Secondary | ICD-10-CM

## 2016-08-27 LAB — POCT GLYCOSYLATED HEMOGLOBIN (HGB A1C): HEMOGLOBIN A1C: 10.9

## 2016-08-27 MED ORDER — GABAPENTIN 100 MG PO CAPS: 100.0000 mg | ORAL_CAPSULE | Freq: Every day | ORAL | 3 refills | Status: DC

## 2016-08-27 MED ORDER — INSULIN DEGLUDEC 100 UNIT/ML ~~LOC~~ SOPN: 40.0000 [IU] | PEN_INJECTOR | Freq: Every day | SUBCUTANEOUS | 0 refills | Status: DC

## 2016-08-27 NOTE — Patient Instructions (Signed)
It was great seeing you today! We have addressed the following issues today  1. Please continue to made changes to your diet. Make an appointment with Dr.Walden to discuss diabetes management. 2. Keep a close eye on the rash if it worsens call the office and will prescribe a medication for you  If we did any lab work today, and the results require attention, either me or my nurse will get in touch with you. If everything is normal, you will get a letter in mail and a message via . If you don't hear from [place] in two weeks, please give [place] a call. Otherwise, we look forward to seeing you again at your next visit. If you have any questions or concerns before then, please call the clinic at [phone].  Please bring all your medications to every doctors visit  Sign up for My Chart to have easy access to your labs results, and communication with your Primary care physician. Please ask Front Desk for some assistance.   Please check-out at the front desk before leaving the clinic.    Take Care,   Dr. Andy Gauss

## 2016-08-29 NOTE — Progress Notes (Signed)
.     Subjective:    Patient ID: Alicia West, female    DOB: 1970-08-09, 46 y.o.   MRN: [id number]   CC: T2DM management follow up   HPI: Patient is a 46 yo female with a past medical history significant for  T2DM, HTN who presents for A1c.  T2DM: Patient is here for A1c check, patient lost insurance coverage and has not been able to buy Levemir. Recently given tresiba sample and has been using 40 units daily. Patient just regain her insurance coverage and will be able to afford medications. Patient endorses poor dietary choices in the past few months and expect A1c to be high.  Right arm rash: Patient noticed antecubital fossa rash this morning. Denies any itching, burning or pain associated with rash. New onset, no prior presentation. Denies any allergic reaction, or pets.  Smoking status reviewed   ROS: all other systems were reviewed and are negative other than in the HPI   Past Medical History:  Diagnosis Date  . Diabetes mellitus   . Herpes simplex labialis   . Hypertension   . TB (tuberculosis) contact    + skin test,/- chest x-ray - in mid 2000's    Past Surgical History:  Procedure Laterality Date  . BREAST SURGERY     Reduction  . TONSILLECTOMY      Objective:  BP (!) 152/100   Pulse 84   Temp 98.6 F (37 C) (Oral)   Ht 5' 9"  (1.753 m)   Wt 246 lb (111.6 kg)   SpO2 98%   BMI 36.33 kg/m   Vitals and nursing note reviewed  General: NAD, pleasant, able to participate in exam Cardiac: RRR, normal heart sounds, no murmurs. 2+ radial and PT pulses bilaterally Respiratory: CTAB, normal effort, No wheezes, rales or rhonchi Abdomen: soft, nontender, nondistended, no hepatic or splenomegaly, +BS Extremities: well demarcated annular papular rash in right arm antecubital fossa, mildly erythematous, no scaling,  Skin: warm and dry, no rashes noted Neuro: alert and oriented x4, no focal deficits Psych: Normal affect and mood   Assessment & Plan:    #T2DM A1c today is 10.9 from 9.5 on 01/2016. Likely secondary to medications non adherence and poor dietary choices. Discuss with patient need for improved TLC and adherence to current regimen. Patient will likely need med optimization, will discuss with PCP at next office visit. Provided patient with 2 tresiba pen. --Continue with Tresiba 40 units daily --Resume levemir 50 units daily when insurance coverage resume. Make sure to stop tresiba regimen --Discuss med titration at next office visit  #Right arm rash Presentation reminiscent of tinea corporis. Given new onset, patient would like to monitor and opted to not treat at the moment. Patient will continue to monitor if rash worsens, will return to clinic to initiate therapy.  Marjie Skiff, MD [place] PGY-2

## 2016-09-03 ENCOUNTER — Encounter: Payer: Self-pay | Admitting: Family Medicine

## 2016-09-03 ENCOUNTER — Ambulatory Visit (INDEPENDENT_AMBULATORY_CARE_PROVIDER_SITE_OTHER): Payer: BLUE CROSS/BLUE SHIELD | Admitting: Family Medicine

## 2016-09-03 VITALS — BP 170/82 | HR 81 | Temp 98.2°F | Ht 69.0 in | Wt 247.8 lb

## 2016-09-03 DIAGNOSIS — R739 Hyperglycemia, unspecified: Secondary | ICD-10-CM

## 2016-09-03 DIAGNOSIS — Z794 Long term (current) use of insulin: Secondary | ICD-10-CM

## 2016-09-03 DIAGNOSIS — R51 Headache: Secondary | ICD-10-CM

## 2016-09-03 DIAGNOSIS — L989 Disorder of the skin and subcutaneous tissue, unspecified: Secondary | ICD-10-CM | POA: Insufficient documentation

## 2016-09-03 DIAGNOSIS — E1165 Type 2 diabetes mellitus with hyperglycemia: Secondary | ICD-10-CM | POA: Diagnosis not present

## 2016-09-03 DIAGNOSIS — I10 Essential (primary) hypertension: Secondary | ICD-10-CM

## 2016-09-03 DIAGNOSIS — E118 Type 2 diabetes mellitus with unspecified complications: Secondary | ICD-10-CM

## 2016-09-03 DIAGNOSIS — R21 Rash and other nonspecific skin eruption: Secondary | ICD-10-CM

## 2016-09-03 DIAGNOSIS — IMO0002 Reserved for concepts with insufficient information to code with codable children: Secondary | ICD-10-CM

## 2016-09-03 DIAGNOSIS — R519 Headache, unspecified: Secondary | ICD-10-CM | POA: Insufficient documentation

## 2016-09-03 LAB — GLUCOSE, POCT (MANUAL RESULT ENTRY): POC Glucose: 182 mg/dl — AB (ref 70–99)

## 2016-09-03 LAB — POCT SKIN KOH: Skin KOH, POC: POSITIVE — AB

## 2016-09-03 MED ORDER — LOSARTAN POTASSIUM-HCTZ 100-25 MG PO TABS: 1.0000 | ORAL_TABLET | Freq: Every day | ORAL | 3 refills | Status: DC

## 2016-09-03 NOTE — Patient Instructions (Addendum)
It was good to see you again today.  We are checking labs as we discussed.  They should be back for the next couple of days.  Take the Lotrimin ultra which is an over-the-counter antifungal for the next 2 weeks. This should help get things cleared up. It may take a total of 4 weeks. If it is not better by that point come back and see me.  I'm going to increase one of your blood pressure medicines. I'd like to see you back on Friday to make sure that your blood pressure is better.  If your headache starts to get worse come back and see [place] sooner or if it is at night go to the emergency department.

## 2016-09-03 NOTE — Assessment & Plan Note (Signed)
Positive KOH scraping. Treating with Lotrimin for tinea corporis. Likely exacerbated by recent exacerbation of her hyperglycemia.

## 2016-09-03 NOTE — Assessment & Plan Note (Signed)
Elevated today.  Unclear if she has actually been taking hyzaar.  Increase hydrochlorothiazide to 25 mg today.  She is to follow-up with [place] on Friday to recheck her blood pressure that point. Can investigate for secondary causes of hypertension if still out of control despite taking 3 medications.

## 2016-09-03 NOTE — Assessment & Plan Note (Signed)
Since be secondary to hypertension. This is her usual presentation of elevated blood pressures. Better with over-the-counter analgesics. Nothing concerning on exam or by history today. We are going to try to secondarily control this to better control her blood pressure. She can continue taking over-the-counter analgesics. Likely also exacerbated by muscle spasm in her neck. Plan will be to follow-up on Friday to make sure she is improving and that her blood pressure is better. Return precautions provided as well as precautions to go to the ED.

## 2016-09-03 NOTE — Assessment & Plan Note (Signed)
Still not controlled -- but CBG better than in past.  Now that her insurance is restarted she should be able to get back on her Lantus and Victoza. This has actually kept her blood sugars under much better control. Follow-up with A1c check in 3 months for this.

## 2016-09-03 NOTE — Progress Notes (Signed)
Subjective:    Alicia West is a 46 y.o. female who presents to [facility] today for diabetes FU:  1.   Diabetes:  Currently on metformin and Truciba.  She plans to restart her Victoza and Lantus in the next couple days when she has finished. The peritoneum for her insurance. She notes that when she was on the Victoza her appetite was much more well controlled as well as her blood sugars. She states that her appetite is "out of control" and that this is contributed to her elevated blood sugars. The highest she is seen was 498 which was several weeks ago. She states it is now hovers between the 150s and 200s.    No adverse effects from medication.  No hypoglycemic events.  No paresthesia or peripheral nerve pain.  No polyuria/polydipsia.  Measures blood sugars at home every evening or so.     Lab Results  Component Value Date   HGBA1C 10.9 08/27/2016   2.  Hypertension:  Long-term problem for this patient.  No adverse effects from medication - though she is unsure exactly what she is taking.  Knows she is taking metoprolol, thinks she is also taking the losartan.  Not checking it regularly outside of here. No CP, dizziness, shortness of breath, palpitations, or LE swelling.   BP Readings from Last 3 Encounters:  09/03/16 (!) 170/82  08/27/16 (!) 152/100  06/27/16 (!) 175/106   3.  Rash on arm:  Present for several weeks. She brought this up to previous visit. Deemed to be tinea corporis but she was hesitant about this diagnosis. Since that visit the rash has lightened somewhat. Does not itch. However is not completely gone away. She has had no similar rash or other skin lesions anywhere else on her body. No fevers or chills.  4.  Headache:   Does have headache today.  Worse with bright light/sounds.  No vomiting.  No numbness or weakness.  No vision changes.  Present for past 5 days or so, present on awakening, better with OTC analgesics.  This is typical presentation of elevated blood pressure for  her. She describes bandlike pressure around her eyes. Also pain in the right occipital region of her neck. She states she had a "crick" in her neck for the past 3 weeks. She has bought a new pillow) help somewhat with this. However it does hurt to turn her head too far to the right.    ROS as above per HPI.    The following portions of the patient's history were reviewed and updated as appropriate: allergies, current medications, past medical history, family and social history, and problem list. Patient is a nonsmoker.    PMH reviewed.  Past Medical History:  Diagnosis Date  . Diabetes mellitus   . Herpes simplex labialis   . Hypertension   . TB (tuberculosis) contact    + skin test,/- chest x-ray - in mid 2000's   Past Surgical History:  Procedure Laterality Date  . BREAST SURGERY     Reduction  . TONSILLECTOMY      Medications reviewed. Current Outpatient Prescriptions  Medication Sig Dispense Refill  . aspirin 81 MG tablet Take 81 mg by mouth daily.    Marland Kitchen atorvastatin (LIPITOR) 40 MG tablet TAKE 1 TABLET (40 MG TOTAL) BY MOUTH DAILY. 30 tablet 8  . B-D ULTRAFINE III SHORT PEN 31G X 8 MM MISC USE AS DIRECTED UP TO 4 TIMES DAILY 100 each 5  . bisacodyl (DULCOLAX) 5  MG EC tablet Take 1 tablet (5 mg total) by mouth daily as needed for moderate constipation. 30 tablet 0  . Blood Glucose Monitoring Suppl (CONTOUR NEXT ONE) KIT 1 kit by Does not apply route 3 (three) times daily. 1 kit 0  . cetirizine (ZYRTEC) 10 MG tablet Take 10 mg by mouth daily as needed for allergies.     Marland Kitchen gabapentin (NEURONTIN) 100 MG capsule Take 1 capsule (100 mg total) by mouth at bedtime. 30 capsule 3  . glucose blood (BAYER CONTOUR NEXT TEST) test strip 1 each by Other route 3 (three) times daily. Use as instructed 100 each 12  . ibuprofen (ADVIL,MOTRIN) 800 MG tablet TAKE 1 TABLET BY MOUTH EVERY 8 HOURS AS NEEDED (Patient taking differently: TAKE 1 TABLET BY MOUTH EVERY 8 HOURS AS NEEDED FOR PAIN) 30 tablet  1  . insulin degludec (TRESIBA FLEXTOUCH) 100 UNIT/ML SOPN FlexTouch Pen Inject 0.4 mLs (40 Units total) into the skin daily at 10 pm. 2 pen 0  . Lancets MISC Use Contour Next Lancet to check blood glucose 3 times a day.  Dx code E11.65. 100 each 12  . LEVEMIR FLEXTOUCH 100 UNIT/ML Pen INJECT 50 UNITS INTO THE SKIN DAILY 15 pen 3  . lidocaine (XYLOCAINE) 2 % solution Use as directed 1 mL in the mouth or throat as needed for mouth pain. 15 mL 1  . lidocaine (XYLOCAINE) 5 % ointment Apply 1 application topically as needed. To painful areas as needed 35.44 g 0  . losartan-hydrochlorothiazide (HYZAAR) 100-12.5 MG tablet TAKE ONE TABLET BY MOUTH DAILY 30 tablet 3  . metFORMIN (GLUCOPHAGE) 500 MG tablet Take 1 tablet (500 mg total) by mouth 2 (two) times daily with a meal. 60 tablet 5  . metoprolol tartrate (LOPRESSOR) 25 MG tablet Take 2 tablets (50 mg total) by mouth 2 (two) times daily. 120 tablet 6  . NOVOTWIST 32G X 5 MM MISC USE ALONG WITH INSULIN INJECTIONS UP TO 4 X DAILY 100 each 5  . polyethylene glycol (MIRALAX / GLYCOLAX) packet Take 17 g by mouth daily. 14 each 3  . senna (SENOKOT) 8.6 MG TABS tablet Take 1 tablet (8.6 mg total) by mouth daily. 120 each 0  . ULTICARE MINI PEN NEEDLES 31G X 6 MM MISC Use to inject insulin subQ once daily for diabetes mellitus type 2 100 each 2  . valACYclovir (VALTREX) 500 MG tablet Take 1 tablet (500 mg total) by mouth 2 (two) times daily. FOR 5 DAYS WITH OUTBREAK 20 tablet 2  . VICTOZA 18 MG/3ML SOPN INJECT 0.6MG SUBCUTANEOUSLY ONCE A DAY---TITRATE UP TO 1.8 MG DAILY 3 mL 6   No current facility-administered medications for this visit.      Objective:   Physical Exam BP (!) 170/82   Pulse 81   Temp 98.2 F (36.8 C) (Oral)   Ht 5' 9"  (1.753 m)   Wt 247 lb 12.8 oz (112.4 kg)   LMP 08/26/2016 (Approximate)   SpO2 99%   BMI 36.59 kg/m  Gen:  Alert, cooperative patient who appears stated age in no acute distress.  Vital signs reviewed. HEENT:  EOMI, PERRL.  Fundoscopy WNL.  MMM Neck:  Supple.  Tender only at extremes of trying to look to her right/laterally.  Does have fairly notable palpable spasm trapezius on the Right.   Cardiac:  Regular rate and rhythm without murmur auscultated.  Good S1/S2. Pulm:  Clear to auscultation bilaterally with good air movement.  No wheezes or rales noted.  Exts: Non edematous BL  LE, warm and well perfused.  Skin:  Erythematous, scaly patch Right elbow, flexural region.  Skin scraping obtain.   Neuro:  CN II - XII intact.  Strength and sensation are 5/5 BL upper and lower extremities.  Normal gait. No focal neurological deficits noted.  Results for orders placed or performed in visit on 09/03/16  Glucose (CBG)  Result Value Ref Range   POC Glucose 182 (A) 70 - 99 mg/dl  POCT Skin KOH  Result Value Ref Range   Skin KOH, POC Positive (A) Negative

## 2016-09-04 LAB — COMPREHENSIVE METABOLIC PANEL WITH GFR
ALT: 16 IU/L (ref 0–32)
AST: 16 IU/L (ref 0–40)
Albumin/Globulin Ratio: 1.1 — ABNORMAL LOW (ref 1.2–2.2)
Albumin: 4.1 g/dL (ref 3.5–5.5)
Alkaline Phosphatase: 112 IU/L (ref 39–117)
BUN/Creatinine Ratio: 14 (ref 9–23)
BUN: 12 mg/dL (ref 6–24)
Bilirubin Total: 0.3 mg/dL (ref 0.0–1.2)
CO2: 25 mmol/L (ref 20–29)
Calcium: 10.2 mg/dL (ref 8.7–10.2)
Chloride: 98 mmol/L (ref 96–106)
Creatinine, Ser: 0.88 mg/dL (ref 0.57–1.00)
GFR calc Af Amer: 91 mL/min/1.73 (ref >59)
GFR calc non Af Amer: 79 mL/min/1.73 (ref >59)
Globulin, Total: 3.6 g/dL (ref 1.5–4.5)
Glucose: 187 mg/dL — ABNORMAL HIGH (ref 65–99)
Potassium: 4.4 mmol/L (ref 3.5–5.2)
Sodium: 139 mmol/L (ref 134–144)
Total Protein: 7.7 g/dL (ref 6.0–8.5)

## 2016-09-04 LAB — CBC
Hematocrit: 35 % (ref 34.0–46.6)
Hemoglobin: 11 g/dL — ABNORMAL LOW (ref 11.1–15.9)
MCH: 26.1 pg — ABNORMAL LOW (ref 26.6–33.0)
MCHC: 31.4 g/dL — ABNORMAL LOW (ref 31.5–35.7)
MCV: 83 fL (ref 79–97)
PLATELETS: 475 10*3/uL — AB (ref 150–379)
RBC: 4.21 x10E6/uL (ref 3.77–5.28)
RDW: 16 % — AB (ref 12.3–15.4)
WBC: 7.5 10*3/uL (ref 3.4–10.8)

## 2016-09-07 ENCOUNTER — Encounter: Payer: Self-pay | Admitting: Family Medicine

## 2016-09-07 ENCOUNTER — Ambulatory Visit (INDEPENDENT_AMBULATORY_CARE_PROVIDER_SITE_OTHER): Payer: BLUE CROSS/BLUE SHIELD | Admitting: Family Medicine

## 2016-09-07 DIAGNOSIS — R21 Rash and other nonspecific skin eruption: Secondary | ICD-10-CM

## 2016-09-07 DIAGNOSIS — E118 Type 2 diabetes mellitus with unspecified complications: Secondary | ICD-10-CM

## 2016-09-07 DIAGNOSIS — Z794 Long term (current) use of insulin: Secondary | ICD-10-CM

## 2016-09-07 DIAGNOSIS — R51 Headache: Secondary | ICD-10-CM | POA: Diagnosis not present

## 2016-09-07 DIAGNOSIS — IMO0002 Reserved for concepts with insufficient information to code with codable children: Secondary | ICD-10-CM

## 2016-09-07 DIAGNOSIS — E1165 Type 2 diabetes mellitus with hyperglycemia: Secondary | ICD-10-CM

## 2016-09-07 DIAGNOSIS — I10 Essential (primary) hypertension: Secondary | ICD-10-CM

## 2016-09-07 DIAGNOSIS — R519 Headache, unspecified: Secondary | ICD-10-CM

## 2016-09-07 MED ORDER — DOXYCYCLINE HYCLATE 100 MG PO TABS: 100.0000 mg | ORAL_TABLET | Freq: Two times a day (BID) | ORAL | 0 refills | Status: DC

## 2016-09-07 NOTE — Assessment & Plan Note (Signed)
Resolved s/p 1/2 dose of another person's narcotic several days ago without recurrence.  Discussed not taking another person's medicine and dangers of narcotics.  She expressed understanding.  RTC if recurs.

## 2016-09-07 NOTE — Patient Instructions (Signed)
It was good to see you again today.  I'm glad that your headache and neck pain are much better.  If these return let me know.  The increased hydrochlorothiazide blood pressure pill is a diuretic so this will help with any fluid retention you have.  Make sure you restart the Levemir and the Victoza. Let me know if these are causing you side effects like leg swelling.  Take the doxycycline twice a day for the next 10 days. This is an antibiotic for the spot on your neck and your chest.  Stop the Neosporin. Start Bactroban or mupirocin ointments instead. Do this for about 2 weeks as well. Let it rest for 2 weeks after that and then see if it has completely gone away. If not we will need to send you to dermatology at that point.  Come back and see me in about 3 months or sooner if you having any problems.

## 2016-09-07 NOTE — Assessment & Plan Note (Signed)
Hasn't yet picked up her victoza and levemir either.  To do this today.

## 2016-09-07 NOTE — Progress Notes (Signed)
Subjective:    CARMYN HAMM is a 46 y.o. female who presents to [facility] today for FU for headaches and HTN:  1. Hypertension:  Seen here on Monday for the same.  Also with painful headache at that visit.  She took half of a friend's oxycodone and had relief from her headache and neck pain about an hour later.  No further headaches.  She has not yet had the opportunity to pick up her new, increased dose of HCTZ.  Has noted several lb weight gain since Monday.  Feels "bloated."  Otherwise, no HA, CP, dizziness, shortness of breath, palpitations, or LE swelling.   BP Readings from Last 3 Encounters:  09/07/16 (!) 154/94  09/03/16 (!) 170/82  08/27/16 (!) 152/100   2.  "Spot" on chest and neck:  Concern for small lesion on upper chest that has been present for about 1 year and another on her neck for about 8 months.  States they were intially pimples that burst and yet have not healed.  Notes that her CBGs have been fairly uncontrolled during that time. She often scratches these lesions and they do bleed.  Do not itch.  No purulence/drainage.  No improvement with neosporin.     ROS as above per HPI.    The following portions of the patient's history were reviewed and updated as appropriate: allergies, current medications, past medical history, family and social history, and problem list. Patient is a nonsmoker.    PMH reviewed.  Past Medical History:  Diagnosis Date  . Diabetes mellitus   . Herpes simplex labialis   . Hypertension   . TB (tuberculosis) contact    + skin test,/- chest x-ray - in mid 2000's   Past Surgical History:  Procedure Laterality Date  . BREAST SURGERY     Reduction  . TONSILLECTOMY      Medications reviewed.   Objective:   Physical Exam BP (!) 154/94   Pulse 85   Temp 98.4 F (36.9 C) (Oral)   Ht 5' 9"  (1.753 m)   Wt 250 lb 12.8 oz (113.8 kg)   LMP 08/26/2016 (Approximate)   SpO2 98%   BMI 37.04 kg/m  Gen:  Alert, cooperative patient who appears  stated age in no acute distress.  Vital signs reviewed. HEENT: EOMI,  MMM Cardiac:  Regular rate and rhythm without murmur auscultated.  Good S1/S2. Pulm:  Clear to auscultation bilaterally with good air movement.  No wheezes or rales noted.   Skin:  1 cm nodule located in area of 3rd rib at Right sternal border.  Prior excoriation noted.  Scabbed over.  Some mild surrounding erythema.  Another 1 cm nodule located Right anterior cervical triangle.  More healed than lesion on chest, but still not completely healed over.    No results found for this or any previous visit (from the past 72 hour(s)).

## 2016-09-07 NOTE — Assessment & Plan Note (Signed)
2 papular lesions that are concerning for chronic non-healing wounds.  One with evidence of cellulitis.  To treat with doxy plus topical abx ointment.  FU in 1 month to re-assess.  May need biopsy at that time if still present.

## 2016-09-07 NOTE — Assessment & Plan Note (Signed)
Better today - though she still hasn't picked up her increased dose of HCTZ.  Leaving here today to pick this up.  Pick this up today and start.  FU in 3 months for recheck.

## 2016-09-26 LAB — HM DIABETES EYE EXAM

## 2016-10-12 ENCOUNTER — Encounter: Payer: Self-pay | Admitting: Family Medicine

## 2016-10-12 ENCOUNTER — Ambulatory Visit (INDEPENDENT_AMBULATORY_CARE_PROVIDER_SITE_OTHER): Payer: BLUE CROSS/BLUE SHIELD | Admitting: Family Medicine

## 2016-10-12 VITALS — BP 118/80 | HR 109 | Temp 98.8°F | Wt 238.0 lb

## 2016-10-12 DIAGNOSIS — J209 Acute bronchitis, unspecified: Secondary | ICD-10-CM | POA: Diagnosis not present

## 2016-10-12 DIAGNOSIS — J019 Acute sinusitis, unspecified: Secondary | ICD-10-CM | POA: Diagnosis not present

## 2016-10-12 MED ORDER — BENZONATATE 100 MG PO CAPS: 100.0000 mg | ORAL_CAPSULE | Freq: Two times a day (BID) | ORAL | 0 refills | Status: DC | PRN

## 2016-10-12 MED ORDER — AZITHROMYCIN 250 MG PO TABS: ORAL_TABLET | ORAL | 0 refills | Status: DC

## 2016-10-12 NOTE — Progress Notes (Signed)
Subjective:     Patient ID: Alicia West, female   DOB: Sep 08, 1970, 46 y.o.   MRN: [id number]  Cough    This is a new problem. The current episode started 1 to 4 weeks ago (Started coughing with nasal congestion and throat pain for 3 weeks.). The problem has been gradually worsening. There has been no fever (She had chills and sweaty). Associated symptoms include chills, coughing, dizziness, headaches and sweats. Pertinent negatives include no fever. Associated symptoms comments: Thick yellowish green sputum. Yellowish-bloody mucus from her nose. Throat pain from excessive coughing.. 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 She has tried acetaminophen (Theraflu, mucinex,Nyquil) for the symptoms. The treatment provided no relief.   Associated symptoms include chills, headaches and sweats. Pertinent negatives include no fever. She throws up sputum, not food content.  Current Outpatient Prescriptions on File Prior to Visit  Medication Sig Dispense Refill  . aspirin 81 MG tablet Take 81 mg by mouth daily.    Marland Kitchen atorvastatin (LIPITOR) 40 MG tablet TAKE 1 TABLET (40 MG TOTAL) BY MOUTH DAILY. 30 tablet 8  . B-D ULTRAFINE III SHORT PEN 31G X 8 MM MISC USE AS DIRECTED UP TO 4 TIMES DAILY 100 each 5  . bisacodyl (DULCOLAX) 5 MG EC tablet Take 1 tablet (5 mg total) by mouth daily as needed for moderate constipation. 30 tablet 0  . Blood Glucose Monitoring Suppl (CONTOUR NEXT ONE) KIT 1 kit by Does not apply route 3 (three) times daily. 1 kit 0  . cetirizine (ZYRTEC) 10 MG tablet Take 10 mg by mouth daily as needed for allergies.     Marland Kitchen doxycycline (VIBRA-TABS) 100 MG tablet Take 1 tablet (100 mg total) by mouth 2 (two) times daily. 20 tablet 0  . gabapentin (NEURONTIN) 100 MG capsule Take 1 capsule (100 mg total) by mouth at bedtime. 30 capsule 3  . glucose blood (BAYER CONTOUR NEXT TEST) test strip 1 each by Other route 3 (three) times daily. Use  as instructed 100 each 12  . ibuprofen (ADVIL,MOTRIN) 800 MG tablet TAKE 1 TABLET BY MOUTH EVERY 8 HOURS AS NEEDED (Patient taking differently: TAKE 1 TABLET BY MOUTH EVERY 8 HOURS AS NEEDED FOR PAIN) 30 tablet 1  . insulin degludec (TRESIBA FLEXTOUCH) 100 UNIT/ML SOPN FlexTouch Pen Inject 0.4 mLs (40 Units total) into the skin daily at 10 pm. 2 pen 0  . Lancets MISC Use Contour Next Lancet to check blood glucose 3 times a day.  Dx code E11.65. 100 each 12  . LEVEMIR FLEXTOUCH 100 UNIT/ML Pen INJECT 50 UNITS INTO THE SKIN DAILY 15 pen 3  . lidocaine (XYLOCAINE) 2 % solution Use as directed 1 mL in the mouth or throat as needed for mouth pain. 15 mL 1  . lidocaine (XYLOCAINE) 5 % ointment Apply 1 application topically as needed. To painful areas as needed 35.44 g 0  . losartan-hydrochlorothiazide (HYZAAR) 100-25 MG tablet Take 1 tablet by mouth daily. 90 tablet 3  . metFORMIN (GLUCOPHAGE) 500 MG tablet Take 1 tablet (500 mg total) by mouth 2 (two) times daily with a meal. 60 tablet 5  . metoprolol tartrate (LOPRESSOR) 25 MG tablet Take 2 tablets (50 mg total) by mouth 2 (two) times daily. 120 tablet 6  . NOVOTWIST 32G X 5 MM MISC USE ALONG WITH INSULIN INJECTIONS UP TO 4 X DAILY 100 each 5  . polyethylene glycol (MIRALAX / GLYCOLAX) packet Take 17 g  by mouth daily. 14 each 3  . senna (SENOKOT) 8.6 MG TABS tablet Take 1 tablet (8.6 mg total) by mouth daily. 120 each 0  . ULTICARE MINI PEN NEEDLES 31G X 6 MM MISC Use to inject insulin subQ once daily for diabetes mellitus type 2 100 each 2  . valACYclovir (VALTREX) 500 MG tablet Take 1 tablet (500 mg total) by mouth 2 (two) times daily. FOR 5 DAYS WITH OUTBREAK 20 tablet 2  . VICTOZA 18 MG/3ML SOPN INJECT 0.6MG SUBCUTANEOUSLY ONCE A DAY---TITRATE UP TO 1.8 MG DAILY 3 mL 6   No current facility-administered medications on file prior to visit.    Past Medical History:  Diagnosis Date  . Diabetes mellitus   . Herpes simplex labialis   . Hypertension    . TB (tuberculosis) contact    + skin test,/- chest x-ray - in mid 2000's   Vitals:   10/12/16 0950  BP: 118/80  Pulse: (!) 109  Temp: 98.8 F (37.1 C)  TempSrc: Oral  SpO2: 97%  Weight: 238 lb (108 kg)      Review of Systems  HENT: Positive for congestion.   Respiratory: Positive for cough.   Cardiovascular: Negative.   Gastrointestinal: Negative.   Genitourinary: Negative.   All other systems reviewed and are negative.      Objective:   Physical Exam  Constitutional: She appears well-developed. No distress.  HENT:  Head: Normocephalic.  Right Ear: Tympanic membrane normal.  Left Ear: Tympanic membrane normal.  Nose: Nose normal.  Mouth/Throat: Uvula is midline and oropharynx is clear and moist.  Cardiovascular: Normal rate and regular rhythm.   No murmur heard. Pulmonary/Chest: Effort normal and breath sounds normal. No respiratory distress. She has no wheezes. She has no rhonchi. She has no rales.  Nursing note and vitals reviewed.      Assessment:     Cough and nasal congestion    Plan:     Likely sub acute bronchitis with Sinusitis. Since its been going on for more than 3 weeks, I will treat with Zithromax. Prescription sent to pharmacy. Tessalon prescribed prn cough. F/U in 1 week if no improvement. Consider chest xray then.

## 2016-10-12 NOTE — Patient Instructions (Signed)

## 2016-10-21 ENCOUNTER — Other Ambulatory Visit: Payer: Self-pay | Admitting: Family Medicine

## 2016-11-05 ENCOUNTER — Encounter: Payer: Self-pay | Admitting: Family Medicine

## 2016-11-22 ENCOUNTER — Other Ambulatory Visit: Payer: Self-pay | Admitting: Family Medicine

## 2016-11-22 DIAGNOSIS — Z794 Long term (current) use of insulin: Principal | ICD-10-CM

## 2016-11-22 DIAGNOSIS — IMO0002 Reserved for concepts with insufficient information to code with codable children: Secondary | ICD-10-CM

## 2016-11-22 DIAGNOSIS — E1165 Type 2 diabetes mellitus with hyperglycemia: Secondary | ICD-10-CM

## 2016-11-22 DIAGNOSIS — E118 Type 2 diabetes mellitus with unspecified complications: Principal | ICD-10-CM

## 2016-12-23 ENCOUNTER — Other Ambulatory Visit: Payer: Self-pay | Admitting: Family Medicine

## 2016-12-30 ENCOUNTER — Other Ambulatory Visit: Payer: Self-pay | Admitting: Family Medicine

## 2016-12-30 DIAGNOSIS — Z794 Long term (current) use of insulin: Principal | ICD-10-CM

## 2016-12-30 DIAGNOSIS — IMO0002 Reserved for concepts with insufficient information to code with codable children: Secondary | ICD-10-CM

## 2016-12-30 DIAGNOSIS — E118 Type 2 diabetes mellitus with unspecified complications: Secondary | ICD-10-CM

## 2016-12-30 DIAGNOSIS — E1165 Type 2 diabetes mellitus with hyperglycemia: Secondary | ICD-10-CM

## 2017-01-11 ENCOUNTER — Other Ambulatory Visit: Payer: Self-pay | Admitting: Family Medicine

## 2017-02-11 ENCOUNTER — Telehealth: Payer: Self-pay | Admitting: Family Medicine

## 2017-02-11 NOTE — Telephone Encounter (Signed)
Pt would like some samples of her insulin.

## 2017-02-12 NOTE — Telephone Encounter (Signed)
It looks like the patient has [employer].  It's my understanding if they have insurance that we don't give samples. I am forwarding this to Dr. Valentina Lucks for his input just to be sure.

## 2017-02-14 NOTE — Telephone Encounter (Signed)
Pt called back regarding the samples and I read off the message from [place] to her. Pt says she is currently working with [place] because something about she got denied her [employer] and is trying to get it back but it hasn't been approved it. Please give this pt a call back to discuss this issue with her

## 2017-02-15 NOTE — Telephone Encounter (Signed)
Alicia West called pt and informed her that there would be 2 pens in fridge for her to pick up. Katharina Caper, Julieana Eshleman D, [place]

## 2017-02-25 ENCOUNTER — Other Ambulatory Visit: Payer: Self-pay | Admitting: Family Medicine

## 2017-03-12 ENCOUNTER — Telehealth: Payer: Self-pay | Admitting: *Deleted

## 2017-03-12 DIAGNOSIS — Z794 Long term (current) use of insulin: Principal | ICD-10-CM

## 2017-03-12 DIAGNOSIS — IMO0002 Reserved for concepts with insufficient information to code with codable children: Secondary | ICD-10-CM

## 2017-03-12 DIAGNOSIS — E1165 Type 2 diabetes mellitus with hyperglycemia: Secondary | ICD-10-CM

## 2017-03-12 DIAGNOSIS — E118 Type 2 diabetes mellitus with unspecified complications: Principal | ICD-10-CM

## 2017-03-12 MED ORDER — INSULIN DETEMIR 100 UNIT/ML FLEXPEN: PEN_INJECTOR | SUBCUTANEOUS | 0 refills | Status: DC

## 2017-03-12 NOTE — Telephone Encounter (Signed)
I am not aware that we carry Levemir sample. I know we carry Lantus. Please check if we have Levemir sample. Thanks.

## 2017-03-12 NOTE — Telephone Encounter (Signed)
Pt is "still having issues" with her insurance.  Is requesting another 2 Samples of levemir.  Will forward to MD for approval. Dillon Livermore, Salome Spotted, [place]

## 2017-03-12 NOTE — Telephone Encounter (Signed)
Pt uses pens.  Ok'd from Dr. Gwendlyn Deutscher to give 2 Pens.  Pt informed. [place], Salome Spotted, CMA

## 2017-03-17 ENCOUNTER — Other Ambulatory Visit: Payer: Self-pay | Admitting: Family Medicine

## 2017-04-02 ENCOUNTER — Telehealth: Payer: Self-pay

## 2017-04-02 NOTE — Telephone Encounter (Signed)
Patient left message on nurse line that she currently does not have insurance. She requests that you change her insulin to Basaglar insulin. States she is out. [name], RN [facility] RN)

## 2017-04-04 MED ORDER — BASAGLAR KWIKPEN 100 UNIT/ML ~~LOC~~ SOPN: PEN_INJECTOR | SUBCUTANEOUS | 3 refills | Status: DC

## 2017-04-04 NOTE — Telephone Encounter (Signed)
I have sent this in by fax to [facility].  If she doesn't have insurance, I'm not sure she'll be able to obtain Basaglar, but perhaps the MAP program is helping her at the HD.

## 2017-04-09 NOTE — Telephone Encounter (Signed)
Pt called nurse line regarding her Rx for insulin. She is requesting that MD call her to discuss- she has several questions regarding insulin and wants to speak directly to MD. Wallace Cullens, RN

## 2017-04-11 NOTE — Telephone Encounter (Signed)
Called and spoke with patient.  She was taking her brother's Basaglar and would like to obtain some insulin, but is out of insurance.  She is appealing that decision but in the meantime needs insulin.   Also, she told me that she was having unilateral leg swelling.  I told her she needs to be seen today for this -- and we could obtain insulin samples and get her information on the MAP program at the [facility] plus financial assistance info all at the same visit.  She said she will call to scheduled when the phones open up again after lunch.  Expressed appreciation for call.

## 2017-04-12 ENCOUNTER — Other Ambulatory Visit: Payer: Self-pay

## 2017-04-12 ENCOUNTER — Ambulatory Visit (INDEPENDENT_AMBULATORY_CARE_PROVIDER_SITE_OTHER): Payer: Self-pay | Admitting: Student in an Organized Health Care Education/Training Program

## 2017-04-12 ENCOUNTER — Encounter: Payer: Self-pay | Admitting: Student in an Organized Health Care Education/Training Program

## 2017-04-12 VITALS — BP 154/92 | HR 115 | Temp 98.3°F | Ht 69.0 in | Wt 246.6 lb

## 2017-04-12 DIAGNOSIS — E785 Hyperlipidemia, unspecified: Secondary | ICD-10-CM

## 2017-04-12 DIAGNOSIS — I10 Essential (primary) hypertension: Secondary | ICD-10-CM

## 2017-04-12 DIAGNOSIS — E1165 Type 2 diabetes mellitus with hyperglycemia: Secondary | ICD-10-CM

## 2017-04-12 LAB — POCT GLYCOSYLATED HEMOGLOBIN (HGB A1C): HEMOGLOBIN A1C: 10.1

## 2017-04-12 LAB — GLUCOSE, POCT (MANUAL RESULT ENTRY): POC GLUCOSE: 207 mg/dL — AB (ref 70–99)

## 2017-04-12 MED ORDER — ATORVASTATIN CALCIUM 40 MG PO TABS: 40.0000 mg | ORAL_TABLET | Freq: Every day | ORAL | 2 refills | Status: DC

## 2017-04-12 MED ORDER — SYRINGE (DISPOSABLE) 1 ML MISC: 1.0000 | Freq: Three times a day (TID) | 0 refills | Status: AC

## 2017-04-12 MED ORDER — INSULIN NPH (HUMAN) (ISOPHANE) 100 UNIT/ML ~~LOC~~ SUSP: 30.0000 [IU] | Freq: Two times a day (BID) | SUBCUTANEOUS | 11 refills | Status: DC

## 2017-04-12 NOTE — Assessment & Plan Note (Addendum)
Patient is tachycardic and hypertensive in the office today in the setting of missing her a.m. dose of Lopressor.  She is not feeling short of breath or with any chest pain.  Plan to recheck her pulse and blood pressure at her follow-up appointment on Monday or Tuesday when she is taking her medicines.

## 2017-04-12 NOTE — Progress Notes (Signed)
CC: Diabetes and HTN  HPI: Alicia West is a 47 y.o. female with PMH significant for HTN, Diabetes, HLD who presents to [facility] today for follow up of chronic illness.  HYPERTENSION Disease Monitoring: BP elevated in the office today. She did not take her metoprolol this AM.  Chest pain, palpitations- None       Dyspnea- none Medications: Losartan-HCTZ 100-33m QHS, Lopressor 25 mg daily Compliance- usually takes it, forgot to take lopressor today  Lightheadedness,Syncope- does sometimes feel dizzy in bed over past week (while hyperglycemic)    Edema-  Some swelling over the past couple of days  DIABETES Disease Monitoring: A1c 10.1 from 10.9. Has been off of insulin for 3 weeks and so was using her brother's insulin pen instead. She is unsure of the name. Blood Sugar ranges->300s with every check  Polyuria/phagia/dipsia- +polyuria (getting up every 2 hours at night), +polydipsia     Visual problems- Does need ophtho appt when has insurance back. Feels her prescription has changed. Medications: Was taking metformin 500 mg BID, Victoza in AM (has not had for 3 weeks) and Levemir 50 units qAM (has not had for 3 weeks), because of her lack of insurance Basaglar was prescribed to be taken 50 mg qdaily but she was unable to get this due to price. No insurance at this time. Compliance- difficulty due to financial barriers Hypoglycemic symptoms- None  Monitoring Labs and Parameters Last A1C:  Lab Results  Component Value Date   HGBA1C 10.1 04/12/2017    Last Lipid:     Component Value Date/Time   CHOL 144 03/17/2013 1109   HDL 40 03/17/2013 1109   LDLDIRECT 85 03/09/2014 1115    Last Bmet  Potassium  Date Value Ref Range Status  09/03/2016 4.4 3.5 - 5.2 mmol/L Final   Sodium  Date Value Ref Range Status  09/03/2016 139 134 - 144 mmol/L Final   Creat  Date Value Ref Range Status  01/25/2016 0.99 0.50 - 1.10 mg/dL Final   Creatinine, Ser  Date Value Ref Range Status    09/03/2016 0.88 0.57 - 1.00 mg/dL Final      Last BPs:  BP Readings from Last 3 Encounters:  04/12/17 (!) 154/92  10/12/16 118/80  09/07/16 (!) 154/94   Review of Symptoms:  See HPI for ROS.   CC, SH/smoking status, and VS noted.  Objective: BP (!) 154/92   Pulse (!) 115   Temp 98.3 F (36.8 C) (Oral)   Ht 5' 9"  (1.753 m)   Wt 246 lb 9.6 oz (111.9 kg)   LMP 03/24/2017 (Exact Date)   SpO2 99%   BMI 36.42 kg/m  GEN: NAD, alert, cooperative, and pleasant. RESPIRATORY: clear to auscultation bilaterally with no wheezes, rhonchi or rales, good effort CV: RRR, no m/r/g, no peripheral edema GI: soft, non-tender, non-distended SKIN: warm and dry, no rashes or lesions PSYCH: AAOx3, appropriate affect  Assessment and plan:  DM (diabetes mellitus), type 2, uncontrolled (HCC) Ordered NPH insulin, I believe this will be less expensive for the patient to get out of pocket.  She is planning on making an appointment with social work as she leaves today, and she is in the process of getting insurance.  Checked blood sugar in the office today to make sure it was not extremely elevated, and the random glucose was 207. -Start at 30 mg NPH tomorrow morning. -Advised her to check blood sugar 3 times during the day tomorrow to monitor for lows during the  day -If she tolerates the 30 mg a.m. dose, is safe for her to take the p.m. NPH dose -Free samples of syringes provided at her visit -Glucose log provided, and patient advised to bring this as well as her glucometer back for a follow-up visit on Monday or Tuesday -Asked her to please call our office if she is unable to obtain the insulin today -Return precautions/reasons to call over the weekend were discussed -Continue metformin  HYPERTENSION, BENIGN SYSTEMIC Patient is tachycardic and hypertensive in the office today in the setting of missing her a.m. dose of Lopressor.  She is not feeling short of breath or with any chest pain.  Plan to  recheck her pulse and blood pressure at her follow-up appointment on Monday or Tuesday when she is taking her medicines.   Orders Placed This Encounter  Procedures  . HgB A1c  . Glucose (CBG)    Meds ordered this encounter  Medications  . insulin NPH Human (HUMULIN N,NOVOLIN N) 100 UNIT/ML injection    Sig: Inject 0.3 mLs (30 Units total) into the skin 2 (two) times daily.    Dispense:  10 mL    Refill:  11  . Syringe, Disposable, 1 ML MISC    Sig: 1 each by Does not apply route 3 (three) times daily.    Dispense:  100 each    Refill:  0  . atorvastatin (LIPITOR) 40 MG tablet    Sig: Take 1 tablet (40 mg total) by mouth daily.    Dispense:  90 tablet    Refill:  2    Everrett Coombe, MD,MS,  PGY2 04/12/2017 3:48 PM

## 2017-04-12 NOTE — Progress Notes (Signed)
poctA

## 2017-04-12 NOTE — Assessment & Plan Note (Addendum)
Ordered NPH insulin, I believe this will be less expensive for the patient to get out of pocket.  She is planning on making an appointment with social work as she leaves today, and she is in the process of getting insurance.  Checked blood sugar in the office today to make sure it was not extremely elevated, and the random glucose was 207. -Start at 30 mg NPH tomorrow morning. -Advised her to check blood sugar 3 times during the day tomorrow to monitor for lows during the day -If she tolerates the 30 mg a.m. dose, is safe for her to take the p.m. NPH dose -Free samples of syringes provided at her visit -Glucose log provided, and patient advised to bring this as well as her glucometer back for a follow-up visit on Monday or Tuesday -Asked her to please call our office if she is unable to obtain the insulin today -Return precautions/reasons to call over the weekend were discussed -Continue metformin

## 2017-04-12 NOTE — Patient Instructions (Signed)
It was a pleasure seeing you today in our clinic. Today we discussed diabetes and blood pressure. Here is the treatment plan we have discussed and agreed upon together:  START insulin NPH at 30 units in the morning. Check your blood sugars 3 times during the day to be sure you do not drop low. If your sugars remain controlled throughout the day it is safe to take 30 mg that evening.  Please schedule follow up to be seen on Monday or Tuesday. Bring your glucometer and the log below so we can titrate your medicine.  Our clinic's number is [phone]. Please call with questions or concerns about what we discussed today.  Be well, Dr. Burr Medico  Fasting Blood Sugar Record  Day  Date      Blood      Time:     Sugar  Day  Date      Blood      Time:     Sugar                                                                                                                                                                                                                         If a reading is high or low, then look back to what the last meal/snack was:  Write down what you last ate, how much, & what time.

## 2017-04-16 ENCOUNTER — Telehealth: Payer: Self-pay

## 2017-04-16 NOTE — Telephone Encounter (Signed)
Pt's daughter Ubaldo Glassing called, wanting to speak to MD who saw her mom last week. Has some questions and concerns about her moms treatment for chronic health issues. Daughter can be reached at [place], RN

## 2017-04-17 NOTE — Telephone Encounter (Signed)
Spoke with daughter on the phone. She is concerned because her mother's blood pressure was elevated when checked as outpatient (160/120) and she looks more swollen. She is concerned about the patient's kidneys. Patient is still urinating normally and is not short of breath. She is unsure whether the patient has her BP medications.   Patient was meant to schedule an appt on 3/11 or 3/12, however looks as though she will be seen Friday. Advised that she keep her Friday appointment, gave red flags/reasons to seek medical attention sooner/ reasons to go to ED before then. Patient's daughter voices understanding and is in agreement with this plan.

## 2017-04-19 ENCOUNTER — Other Ambulatory Visit: Payer: Self-pay

## 2017-04-19 ENCOUNTER — Ambulatory Visit
Admission: RE | Admit: 2017-04-19 | Discharge: 2017-04-19 | Disposition: A | Discharge status: Home / Self Care | Payer: Self-pay | Source: Ambulatory Visit | Attending: Family Medicine | Admitting: Family Medicine

## 2017-04-19 ENCOUNTER — Telehealth: Payer: Self-pay | Admitting: Family Medicine

## 2017-04-19 ENCOUNTER — Encounter: Payer: Self-pay | Admitting: Family Medicine

## 2017-04-19 ENCOUNTER — Ambulatory Visit (INDEPENDENT_AMBULATORY_CARE_PROVIDER_SITE_OTHER): Payer: Self-pay | Admitting: Family Medicine

## 2017-04-19 VITALS — BP 160/104 | HR 84 | Temp 97.8°F | Ht 69.0 in | Wt 243.8 lb

## 2017-04-19 DIAGNOSIS — M79672 Pain in left foot: Secondary | ICD-10-CM

## 2017-04-19 DIAGNOSIS — I10 Essential (primary) hypertension: Secondary | ICD-10-CM

## 2017-04-19 DIAGNOSIS — Z23 Encounter for immunization: Secondary | ICD-10-CM

## 2017-04-19 DIAGNOSIS — Z794 Long term (current) use of insulin: Secondary | ICD-10-CM

## 2017-04-19 DIAGNOSIS — E118 Type 2 diabetes mellitus with unspecified complications: Secondary | ICD-10-CM

## 2017-04-19 IMAGING — CR DG FOOT COMPLETE 3+V*L*
3 series · 3 of 3 positions shown · non-contrast
Comparison: None.

CLINICAL DATA: Acute left pain beginning 2 weeks ago. Patient is a
diabetic. Concern for neuropathy.

EXAM:
LEFT FOOT - COMPLETE 3+ VIEW

[x foot ap left]
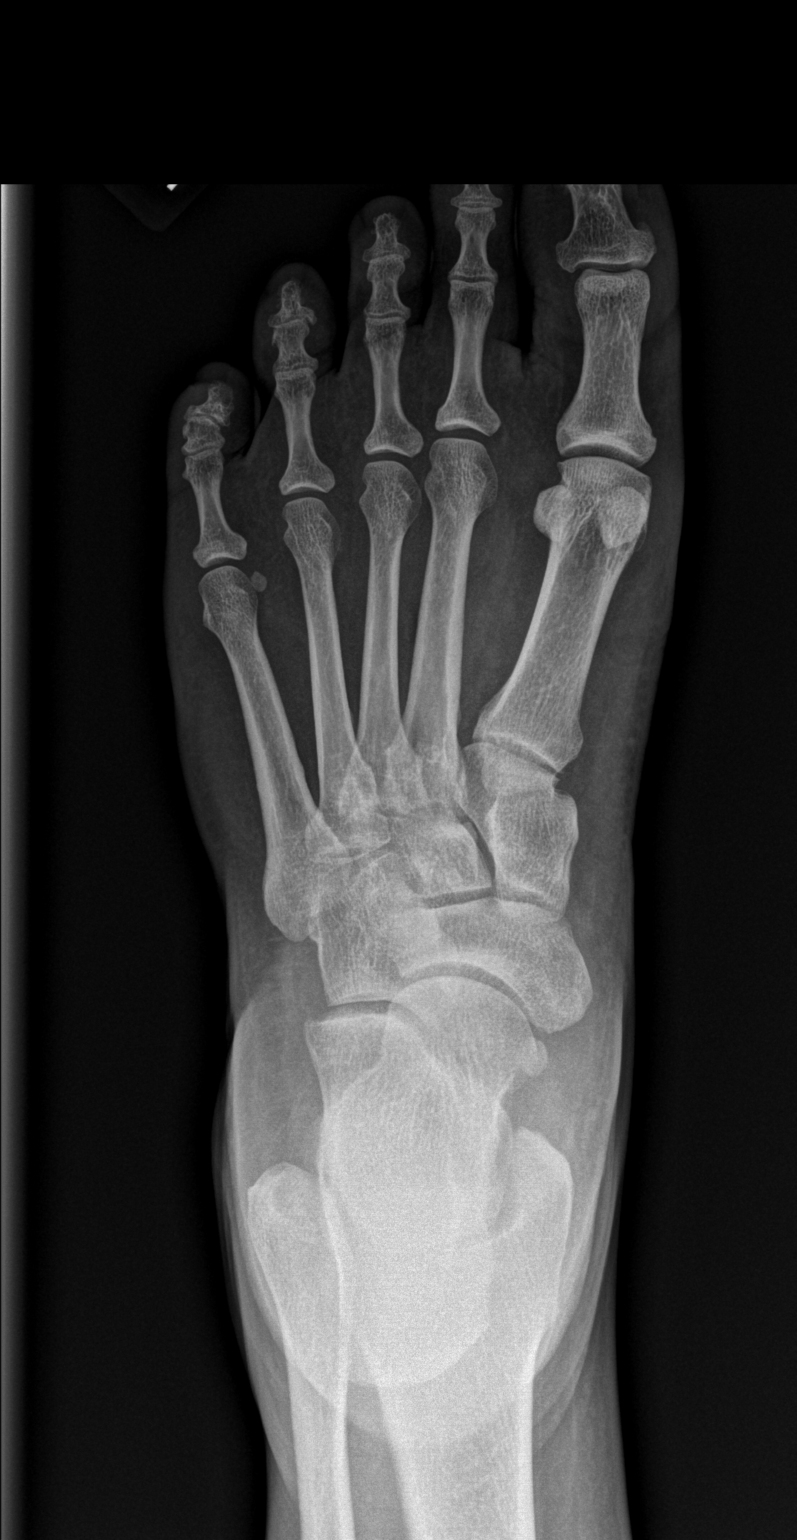

[x foot obl left]
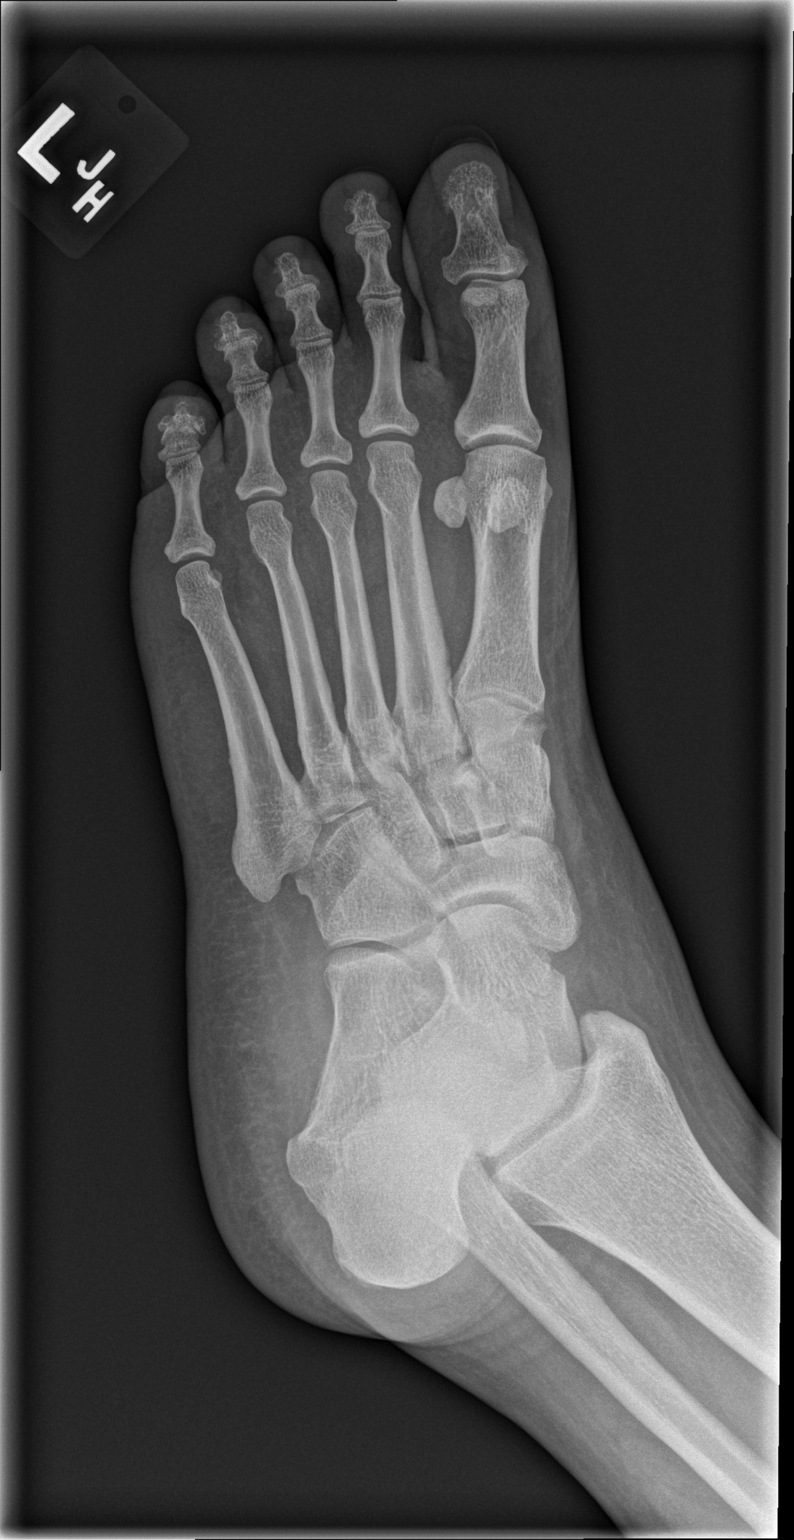

[x foot lat left]
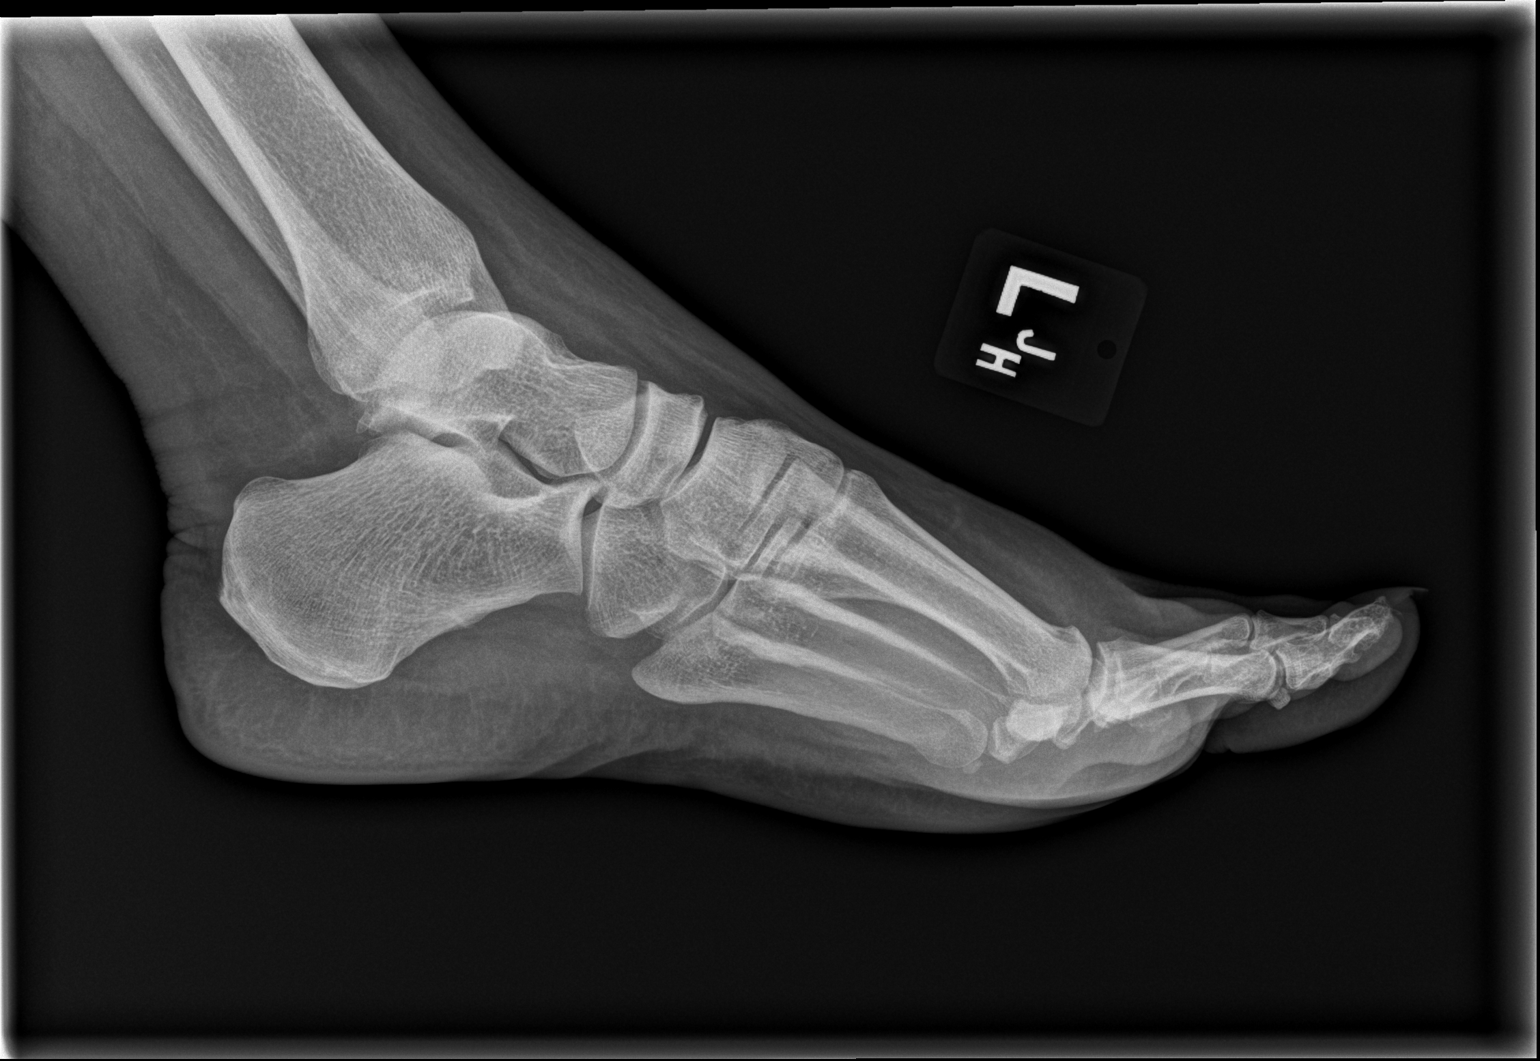

[3 of 3 positions shown; findings below may reference images not displayed]

FINDINGS: Degenerative changes seen in the toes. No fractures or dislocations.
No soft tissue abnormalities or bony erosions.
IMPRESSION: Negative.

## 2017-04-19 MED ORDER — METOPROLOL TARTRATE 100 MG PO TABS: 100.0000 mg | ORAL_TABLET | Freq: Two times a day (BID) | ORAL | 3 refills | Status: DC

## 2017-04-19 MED ORDER — INSULIN GLARGINE 100 UNIT/ML SOLOSTAR PEN: 40.0000 [IU] | PEN_INJECTOR | Freq: Every day | SUBCUTANEOUS | 99 refills | Status: DC

## 2017-04-19 NOTE — Patient Instructions (Addendum)
It was good to see you again today.  Use the Lantus 40 units a day.    Make an appt to see Dr. Valentina Lucks in pharmacy clinic last week.  Go to [facility] for an xray of your foot.  You don't need an appointment.  I will call with the results.  I have increased your dose of metroprolol.

## 2017-04-19 NOTE — Telephone Encounter (Signed)
Called to discuss negative xrays with patient.  She bought compression stockings after appt.  Plan will be to see if these provide any support.  If not, after about a week, recommend attempt at ankle brace.  I'll see her after she sees Dr. Valentina Lucks next week.  She was appreciative of call.

## 2017-04-19 NOTE — Progress Notes (Signed)
Dg eft Subjective:    Alicia West is a 47 y.o. female who presents to [facility] today for diabetes:  1. Diabetes:  Currently on just Basaglar from her brother.  She has been unable to find affordable insulin otherwise, including NPH.   No adverse effects from medication.  No hypoglycemic events.  No paresthesia or peripheral nerve pain.  No polyuria/polydipsia.  Measures blood sugars at home every day or so.  States runs 300s before starting Basaglar, and 200s currently.        Lab Results  Component Value Date   HGBA1C 10.1 04/12/2017   2.  Foot swelling:  Present for several months.  Occurs at the end of the day most days when she has been on her feet.  Better with elevation.  Does have some pain when walking when swollen.  Otherwise sensation of "tightness."  No falls or sprained ankle.  No leg swelling.  No recent travel.     ROS as above per HPI.    The following portions of the patient's history were reviewed and updated as appropriate: allergies, current medications, past medical history, family and social history, and problem list. Patient is a nonsmoker.    PMH reviewed.  Past Medical History:  Diagnosis Date  . Diabetes mellitus   . Herpes simplex labialis   . Hypertension   . TB (tuberculosis) contact    + skin test,/- chest x-ray - in mid 2000's   Past Surgical History:  Procedure Laterality Date  . BREAST SURGERY     Reduction  . TONSILLECTOMY      Medications reviewed. Current Outpatient Medications  Medication Sig Dispense Refill  . aspirin 81 MG tablet Take 81 mg by mouth daily.    Marland Kitchen atorvastatin (LIPITOR) 40 MG tablet Take 1 tablet (40 mg total) by mouth daily. 90 tablet 2  . azithromycin (ZITHROMAX) 250 MG tablet Take two tablets today.Starting tomorrow take one tablet daily. 6 tablet 0  . B-D ULTRAFINE III SHORT PEN 31G X 8 MM MISC USE AS DIRECTED UP TO 4 TIMES DAILY 100 each 5  . benzonatate (TESSALON) 100 MG capsule Take 1 capsule (100 mg total) by mouth 2  (two) times daily as needed for cough. 20 capsule 0  . bisacodyl (DULCOLAX) 5 MG EC tablet Take 1 tablet (5 mg total) by mouth daily as needed for moderate constipation. 30 tablet 0  . Blood Glucose Monitoring Suppl (CONTOUR NEXT ONE) KIT 1 kit by Does not apply route 3 (three) times daily. 1 kit 0  . cetirizine (ZYRTEC) 10 MG tablet Take 10 mg by mouth daily as needed for allergies.     Marland Kitchen doxycycline (VIBRA-TABS) 100 MG tablet Take 1 tablet (100 mg total) by mouth 2 (two) times daily. 20 tablet 0  . gabapentin (NEURONTIN) 100 MG capsule Take 1 capsule (100 mg total) by mouth at bedtime. 30 capsule 3  . glucose blood (BAYER CONTOUR NEXT TEST) test strip 1 each by Other route 3 (three) times daily. Use as instructed 100 each 12  . ibuprofen (ADVIL,MOTRIN) 800 MG tablet TAKE 1 TABLET BY MOUTH EVERY 8 HOURS AS NEEDED (Patient taking differently: TAKE 1 TABLET BY MOUTH EVERY 8 HOURS AS NEEDED FOR PAIN) 30 tablet 1  . insulin degludec (TRESIBA FLEXTOUCH) 100 UNIT/ML SOPN FlexTouch Pen Inject 0.4 mLs (40 Units total) into the skin daily at 10 pm. 2 pen 0  . Insulin Detemir (LEVEMIR FLEXTOUCH) 100 UNIT/ML Pen INJECT 50 UNITS UNDER THE SKIN  DAILY 3 mL 0  . Insulin Glargine (BASAGLAR KWIKPEN) 100 UNIT/ML SOPN Inject 50 units SubQ at bedtime 3 mL 3  . insulin NPH Human (HUMULIN N,NOVOLIN N) 100 UNIT/ML injection Inject 0.3 mLs (30 Units total) into the skin 2 (two) times daily. 10 mL 11  . Lancets MISC Use Contour Next Lancet to check blood glucose 3 times a day.  Dx code E11.65. 100 each 12  . lidocaine (XYLOCAINE) 2 % solution Use as directed 1 mL in the mouth or throat as needed for mouth pain. 15 mL 1  . losartan-hydrochlorothiazide (HYZAAR) 100-25 MG tablet Take 1 tablet by mouth daily. 90 tablet 3  . metFORMIN (GLUCOPHAGE) 500 MG tablet TAKE ONE TABLET BY MOUTH TWICE A DAY WITH A MEAL 60 tablet 2  . metoprolol tartrate (LOPRESSOR) 25 MG tablet TAKE TWO TABLETS BY MOUTH TWICE A DAY 360 tablet 2  .  NOVOTWIST 32G X 5 MM MISC USE ALONG WITH INSULIN INJECTIONS UP TO 4 X DAILY 100 each 4  . polyethylene glycol (MIRALAX / GLYCOLAX) packet Take 17 g by mouth daily. 14 each 3  . senna (SENOKOT) 8.6 MG TABS tablet Take 1 tablet (8.6 mg total) by mouth daily. 120 each 0  . Syringe, Disposable, 1 ML MISC 1 each by Does not apply route 3 (three) times daily. 100 each 0  . ULTICARE MINI PEN NEEDLES 31G X 6 MM MISC Use to inject insulin subQ once daily for diabetes mellitus type 2 100 each 2  . valACYclovir (VALTREX) 500 MG tablet Take 1 tablet (500 mg total) by mouth 2 (two) times daily. FOR 5 DAYS WITH OUTBREAK 20 tablet 2  . VICTOZA 18 MG/3ML SOPN INJECT  0.6MG SUBCUTANEOUSLY ONCE DAILY. TITRATE UP TO 1.8 MG DAILY 9 pen 4   No current facility-administered medications for this visit.      Objective:   Physical Exam BP (!) 160/104   Pulse 84   Temp 97.8 F (36.6 C) (Oral)   Ht 5' 9"  (1.753 m)   Wt 243 lb 12.8 oz (110.6 kg)   LMP 03/24/2017 (Exact Date)   SpO2 99%   BMI 36.00 kg/m  Gen:  Alert, cooperative patient who appears stated age in no acute distress.  Vital signs reviewed. HEENT: EOMI,  MMM Cardiac:  Regular rate and rhythm without murmur auscultated.  Good S1/S2. Pulm:  Clear to auscultation bilaterally with good air movement.  No wheezes or rales noted.   Abd:  Soft/nondistended/nontender.  Good bowel sounds throughout all four quadrants.  No masses noted.  Foot exam: No deformities, ulcerations, or other skin breakdown BL feet.  Sensation intact to monofilament and light touch.  PT and DP pulses intact BL.   MSK:  Foot exam as above.  Left foot also with tenderness along Left 5th metatarsal bone.  No tenderness over head of 5th metatarsal.  Some tenderness with eversion and inversion of ankle at the lateral mallolus on left.  No swelling today.     Imp/Plan: 1. DM2: - Will provide lantus pen as refill until Pharmacy clinic next week.  - I would like for her to meet with  Pharmacy Clinic to have a more in-depth discussion about her diabetes as well as discuss options for medications without insurance.  - Foot exam performed today.  2.  HTN:   - Still elevated. - increasing metoprolol to 100 mg daily - FU in 2 weeks after pharm clinic for recheck  3.  Left foot pain: -  new problem, though present ~several months.  - no injury - no evidence of jones fracture based on exam.  Not currently swollen - Plan xray.  Call pt with result.  DM2 might interfere with sensation though normal foot exam - If negative, will treat with sneakers and ASO brace and RICE

## 2017-04-19 NOTE — Addendum Note (Signed)
Addended by: Londell Moh T on: 04/19/2017 03:47 PM   Modules accepted: Orders, SmartSet

## 2017-04-20 LAB — CBC
HEMATOCRIT: 33.6 % — AB (ref 34.0–46.6)
HEMOGLOBIN: 10.4 g/dL — AB (ref 11.1–15.9)
MCH: 25.2 pg — AB (ref 26.6–33.0)
MCHC: 31 g/dL — ABNORMAL LOW (ref 31.5–35.7)
MCV: 82 fL (ref 79–97)
PLATELETS: 486 10*3/uL — AB (ref 150–379)
RBC: 4.12 x10E6/uL (ref 3.77–5.28)
RDW: 16.6 % — ABNORMAL HIGH (ref 12.3–15.4)
WBC: 7.3 10*3/uL (ref 3.4–10.8)

## 2017-04-20 LAB — COMPREHENSIVE METABOLIC PANEL
ALBUMIN: 4.1 g/dL (ref 3.5–5.5)
ALT: 14 IU/L (ref 0–32)
AST: 11 IU/L (ref 0–40)
Albumin/Globulin Ratio: 1.1 — ABNORMAL LOW (ref 1.2–2.2)
Alkaline Phosphatase: 103 IU/L (ref 39–117)
BUN / CREAT RATIO: 12 (ref 9–23)
BUN: 12 mg/dL (ref 6–24)
Bilirubin Total: 0.3 mg/dL (ref 0.0–1.2)
CALCIUM: 9.6 mg/dL (ref 8.7–10.2)
CO2: 23 mmol/L (ref 20–29)
CREATININE: 1.02 mg/dL — AB (ref 0.57–1.00)
Chloride: 100 mmol/L (ref 96–106)
GFR calc non Af Amer: 66 mL/min/{1.73_m2} (ref >59)
GFR, EST AFRICAN AMERICAN: 76 mL/min/{1.73_m2} (ref >59)
GLOBULIN, TOTAL: 3.9 g/dL (ref 1.5–4.5)
Glucose: 211 mg/dL — ABNORMAL HIGH (ref 65–99)
Potassium: 4.2 mmol/L (ref 3.5–5.2)
SODIUM: 137 mmol/L (ref 134–144)
TOTAL PROTEIN: 8 g/dL (ref 6.0–8.5)

## 2017-04-25 ENCOUNTER — Encounter: Payer: Self-pay | Admitting: Pharmacist

## 2017-04-25 ENCOUNTER — Ambulatory Visit (INDEPENDENT_AMBULATORY_CARE_PROVIDER_SITE_OTHER): Payer: Self-pay | Admitting: Pharmacist

## 2017-04-25 VITALS — BP 168/106 | HR 82 | Wt 244.8 lb

## 2017-04-25 DIAGNOSIS — I10 Essential (primary) hypertension: Secondary | ICD-10-CM

## 2017-04-25 DIAGNOSIS — E1165 Type 2 diabetes mellitus with hyperglycemia: Secondary | ICD-10-CM

## 2017-04-25 MED ORDER — LIRAGLUTIDE 18 MG/3ML ~~LOC~~ SOPN: 1.2000 mg | PEN_INJECTOR | Freq: Every day | SUBCUTANEOUS | 0 refills | Status: DC

## 2017-04-25 MED ORDER — SPIRONOLACTONE 25 MG PO TABS: 25.0000 mg | ORAL_TABLET | Freq: Every day | ORAL | 1 refills | Status: DC

## 2017-04-25 MED ORDER — INSULIN GLARGINE 100 UNIT/ML SOLOSTAR PEN: 40.0000 [IU] | PEN_INJECTOR | Freq: Every day | SUBCUTANEOUS | 0 refills | Status: DC

## 2017-04-25 NOTE — Assessment & Plan Note (Signed)
Hypertension.  Elevated blood pressure despite multiple drug regimen. Initiated spironolactone 4m once daily. Obtained BMET in office today for baseline comparison.  Follow-up in Rx Clinic in 1 week for BP check and repeat BMET.

## 2017-04-25 NOTE — Assessment & Plan Note (Signed)
Diabetes longstanding currently worsened control due to lack of medicine insurance coverage.  She is significantly worse control since stopping liraglutide (Victoza). Patient denies hypoglycemic events and is able to verbalize appropriate hypoglycemia management plan. Patient reports adherence with medication available to her (currently only Lantus insulin). Control is suboptimal due to in adequate medication supply with gap with insurance coverage.  Continued basal insulin Lantus (insulin glargine) at 40 units once daily.  If CBGs are diminshed over the next two weeks to < 100 we likely need to reduce this insulin by 5 units.  Restarted Victoza (liraglutide) with use of sample medication.  Instructed to increase daily dose gradually to a dose of 1.4GY plus 5 clicks (less than the 1.62m dose causing nausea in the past).   She was also supplied additional samples of Lantus.

## 2017-04-25 NOTE — Progress Notes (Signed)
    S:     Chief Complaint  Patient presents with  . Medication Management    diabetes, hypetension    Patient arrives in good spirits.  Presents for diabetes evaluation, education, and management at the request of Mingo Amber, last seen on 04/19/2017.  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  Patient reports Diabetes was diagnosed in ~ 2005.    Patient reports adherence with medications. However has run out of insulins due to changes in insurance coverage. Plans to have new insurance within the next month.  Current diabetes medications include: Lantus and metformin. Current hypertension medications include: metoprolol 126m BID and losartan 100/hctz236mdaily.  Patient denies hypoglycemic events.  Patient reported dietary habits: Eats 2 small meals/day while on liraglutide  OFF of liraglutide 3-4 FULL meals of food.  Breakfast: lasagna, honeynut raisin bran Lunch:"regular stuff" BBQ chicken Dinner:BBQ chicken, corn, cabbage Snacks: chips, reese's cups  Drinks: water or small amount of juice / almond milk  Patient reported exercise habits: "non-existent"    Patient reports nocturia.  3-4 times per night.  Patient reports neuropathy.  Stabbing, "tickling", burning in left foot.  Patient reports visual changes. Patient reports self foot exams.  Checks routinely - no reported issues with feet.    O:  Physical Exam  Constitutional: She appears well-developed and well-nourished.      Review of Systems  Eyes: Positive for blurred vision.  All other systems reviewed and are negative.    Lab Results  Component Value Date   HGBA1C 10.1 04/12/2017   Vitals:   04/25/17 0856 04/25/17 0909  BP: (!) 162/106 (!) 168/106  Pulse: 82   SpO2: 95%     Home fasting CBG: low 200s fasting since restarting lantus.    A/P: Diabetes longstanding currently worsened control due to lack of medicine insurance coverage.  She is significantly worse control since stopping  liraglutide (Victoza). Patient denies hypoglycemic events and is able to verbalize appropriate hypoglycemia management plan. Patient reports adherence with medication available to her (currently only Lantus insulin). Control is suboptimal due to in adequate medication supply with gap with insurance coverage.  Continued basal insulin Lantus (insulin glargine) at 40 units once daily.  If CBGs are diminshed over the next two weeks to < 100 we likely need to reduce this insulin by 5 units.  Restarted Victoza (liraglutide) with use of sample medication.  Instructed to increase daily dose gradually to a dose of 1.8.1LXlus 5 clicks (less than the 1.33m75mose causing nausea in the past).   She was also supplied additional samples of Lantus.   Hypertension.  Elevated blood pressure despite multiple drug regimen. Initiated spironolactone 20m92mce daily. Obtained BMET in office today for baseline comparison.  Follow-up in Rx Clinic in 1 week for BP check and repeat BMET.    Written patient instructions provided.  Total time in face to face counseling 45 minutes.   Follow up in Pharmacist Clinic Visit 2-3 weeks.   Patient seen with ChriHildred AlaminarmD Candidate and ShanJalene MulletarmD, PGY1 Resident and CaroDeirdre PippinsY2 Pharmacy Resident, PharmD, BCPS.

## 2017-04-25 NOTE — Patient Instructions (Addendum)
Thanks for coming to see Alicia West today! We're going to get on top of the diabetes! Goal A1C is less than 8, less than 7 if we can get there!   1. Start Victoza at 0.6 mg once a day. Increase by 1 click each daily until you're at the 1.2 mg dose. We know that this is the dose you could tolerate.   2. Continue Lantus at 40 units once a day. If you see blood sugar numbers BELOW 100, reduce your lantus by 5 units and CALL Alicia West.   3. Start Spironolactone 25 mg once daily. Continue taking your other blood pressure medications.   Come back to see Alicia West next week for a blood pressure check and lab work  Come back to see Alicia West in 2-3 weeks for diabetes visit

## 2017-04-26 NOTE — Progress Notes (Signed)
Patient ID: Alicia West, female   DOB: 07/07/1970, 47 y.o.   MRN: [id number] Reviewed: Agree with Dr. Graylin Shiver documentation and management.

## 2017-05-02 ENCOUNTER — Ambulatory Visit: Payer: Self-pay | Admitting: Pharmacist

## 2017-05-09 ENCOUNTER — Ambulatory Visit (INDEPENDENT_AMBULATORY_CARE_PROVIDER_SITE_OTHER): Payer: Self-pay | Admitting: Pharmacist

## 2017-05-09 ENCOUNTER — Encounter: Payer: Self-pay | Admitting: Pharmacist

## 2017-05-09 DIAGNOSIS — I10 Essential (primary) hypertension: Secondary | ICD-10-CM

## 2017-05-09 DIAGNOSIS — E1165 Type 2 diabetes mellitus with hyperglycemia: Secondary | ICD-10-CM

## 2017-05-09 MED ORDER — GLUCOSE BLOOD VI STRP: 1.0000 | ORAL_STRIP | Freq: Three times a day (TID) | 12 refills | Status: DC

## 2017-05-09 MED ORDER — CARVEDILOL 12.5 MG PO TABS: 12.5000 mg | ORAL_TABLET | Freq: Two times a day (BID) | ORAL | 3 refills | Status: DC

## 2017-05-09 NOTE — Patient Instructions (Addendum)
Thank you for coming into the clinic today!  I am sorry to hear that you are felling so tired, sleepy and dizzy. We believe this may be due to an increase in your metoprolol tartrate dose.  Stop metoprolol tartrate  Start carvedilol 12.5 mg twice daily for your blood pressure  Increase your Victoza dose from 0.6 mg once daily to 1.2 mg once daily and stay at that dose slowly. Only adjust on days that you are not feeling so nauseous.   Remember to check you blood sugars with your Contour Next meter. If you see blood sugars that are low (< 80 mg/dL) drop down your Lantus by 5 units (35 units per day).   Follow up with Dr. Valentina Lucks in about 4 - 5 weeks (1 month) to see how you are doing. If you have any vomiting between now and then let us know.

## 2017-05-09 NOTE — Assessment & Plan Note (Signed)
Diabetes longstanding currently uncontrolled. Patient denies hypoglycemic events and is able to verbalize appropriate hypoglycemia management plan. Patient reports adherence with medication. Control is suboptimal due to lack of insurance coverage, dietary indiscretion, and difficulty starting optimal medication management.  Increased dose of Victoza (liraglutide) to 1.2 mg once daily. Counseled patient to slowly increase her dose and to only increase her dose on days where she is not feeling nauseous. Sent prescription for glucose meter strips to pharmacy. Counseled patient to begin testing BG on a daily basis once she is able to obtain them.

## 2017-05-09 NOTE — Progress Notes (Signed)
S:     Chief Complaint  Patient presents with  . Medication Management    diabetes     Patient arrives ambulating independently with a tired affect.  Presents for diabetes evaluation, education, and management at the request of [name]. Patient was referred on 04/19/17.  Patient was last seen by Primary Care Provider on 04/19/17. At last visit was uninsured and working hard to find insurance coverage in the [employer]. At this time the pt is still uninsured but has been contacted by an [employer] via the phone and will return their call soon. Coverage likely in the near future.  Patient reports good adherence with medications. Self reports using a pillbox to keep track of medication dosing.  Current diabetes medications include: Lantus 40 units once daily, metformin 500 mg twice daily, and Victoza 0.6 mg once daily.  Pt reports prior up titration of Victoza to 1.8 mg/dL too quickly that resulted in severe nausea and vomiting. Current Victoza dose is tolerable but results in significant nausea. Pt feels that she is "unable to eat a whole plate of food without losing my appetite". Prior up titration of metformin resulted in untolerable GI issues.   Patient denies hypoglycemic events.  Patient reported dietary habits: Eats 2 meals/day Breakfast: Glucerna, today had yogurt and almond milk Lunch: Another glucerna  Dinner: 2 pieces of chicken, green beans, mashed potatoes, and corn Snacks: popcorn, chips  Current hypertension medications include: metoprolol tartrate 100 mg twice daily, losartan/HCTZ 100/25 mg, and spironolactone 25 mg once daily.  Recent up-titration of metoprolol tartrate to 100 mg twice daily has coincided with pt reported new onset of severe fatigue, dizziness, and tiredness. Feels that she is unable to concentrate at school or work. Reports only slight change in sleeping habits and reports feeling tired even after drinking 2 cups of coffee.   O:   Lab  Results  Component Value Date   HGBA1C 10.1 04/12/2017   Currently the patient is unable to obtain SMBG meter strips due to a lack of insurance so she is unable to test her BG on a daily basis.  Home fasting CBG: >100 mg/dL  2 hour post-prandial/random CBG: >150 mg/dL.  10 year ASCVD risk: 14.3%.  A/P: Diabetes longstanding currently uncontrolled. Patient denies hypoglycemic events and is able to verbalize appropriate hypoglycemia management plan. Patient reports adherence with medication. Control is suboptimal due to lack of insurance coverage, dietary indiscretion, and difficulty starting optimal medication management.  Increased dose of Victoza (liraglutide) to 1.2 mg once daily. Counseled patient to slowly increase her dose and to only increase her dose on days where she is not feeling nauseous. Sent prescription for glucose meter strips to pharmacy. Counseled patient to begin testing BG on a daily basis once she is able to obtain them.  Next A1C anticipated June 2019.    ASCVD risk greater than 7.5%. Continued Aspirin 81 mg and consider re-challenging patient with rosuvastatin 10 mg at next clinic visit (failed atorvastatin therapy due to muscle ache intolerance).   Hypertension longstanding currently uncontrolled.  Patient reports adherence with medication. Control is suboptimal due to resistant HTN and intolerance to current therapy. Discontinued metoprolol tartrate 100 mg twice daily.  Started carvedilol 12.5 mg twice daily. Counseled to check BP at home. Can up-titrate dose at next office visit if BP is not up to goal.  Written patient instructions provided.  Total time in face to face counseling 30 minutes.   Follow up in Pharmacist  Clinic Visit 4 - 6 weeks.   Patient seen with Hildred Alamin, PharmD Candidate.

## 2017-05-09 NOTE — Assessment & Plan Note (Signed)
Hypertension longstanding currently uncontrolled.  Patient reports adherence with medication. Control is suboptimal due to resistant HTN and intolerance to current therapy. Discontinued metoprolol tartrate 100 mg twice daily.  Started carvedilol 12.5 mg twice daily. Counseled to check BP at home. Can up-titrate dose at next office visit if BP is not up to goal.

## 2017-05-14 ENCOUNTER — Other Ambulatory Visit: Payer: Self-pay | Admitting: Family Medicine

## 2017-06-20 ENCOUNTER — Encounter: Payer: Self-pay | Admitting: Pharmacist

## 2017-06-20 ENCOUNTER — Ambulatory Visit (INDEPENDENT_AMBULATORY_CARE_PROVIDER_SITE_OTHER): Payer: Self-pay | Admitting: Pharmacist

## 2017-06-20 DIAGNOSIS — E1165 Type 2 diabetes mellitus with hyperglycemia: Secondary | ICD-10-CM

## 2017-06-20 DIAGNOSIS — I10 Essential (primary) hypertension: Secondary | ICD-10-CM

## 2017-06-20 DIAGNOSIS — E785 Hyperlipidemia, unspecified: Secondary | ICD-10-CM

## 2017-06-20 MED ORDER — CARVEDILOL 25 MG PO TABS: 25.0000 mg | ORAL_TABLET | Freq: Two times a day (BID) | ORAL | 3 refills | Status: DC

## 2017-06-20 MED ORDER — LIRAGLUTIDE 18 MG/3ML ~~LOC~~ SOPN
1.2000 mg | PEN_INJECTOR | Freq: Every day | SUBCUTANEOUS | 0 refills | Status: DC
Start: 2017-06-20 — End: 2018-05-01

## 2017-06-20 NOTE — Assessment & Plan Note (Signed)
Diabetes longstanding since 2011 currently uncontrolled with high CBG readings and A1c > 7%. Patient reports some hypoglycemic symptoms when her blood glucose is in the 90s-120s. She is able to verbalize appropriate hypoglycemia management plan. Patient reports adherence with medication. Control is suboptimal due to her diet, lack of exercise, and increased stress from schoolwork and exams.  Continued basal insulin Lantus (insulin glargine) 40 units once daily in the morning. Instructed patient to decrease to Lantus 35 units daily if her CBGs are consistently <100.  Increased dose of Victoza (liraglutide) from 1.17m daily gradually toward 1.86mdaily. Patient will slowly increase 1 click every few days from 1.2 mg as she feels comfortable. Patient was more agreeable to slowly increasing the dose as she felt comfortable and expressed understanding to titration plan.  Continued metformin 50065mID as patient endorses severe nausea with higher doses.

## 2017-06-20 NOTE — Patient Instructions (Addendum)
Thanks for coming to see Alicia West today! We are making progress but we still have some work to do with blood pressure and diabetes.   1. Increase carvedilol to 25 mg twice a day.  You may take two of your current pills (12.90m) twice daily until they are gone.  2. Dial your Victoza to 1.2 mg and then increase your dose by 1 click every few days until you find your maximum tolerated dose. 3. Continue same dose of Lantus - 40 units once daily in the morning.  Plan to reduce your Lantus from 40 to 35units daily if you observe multiple blood sugar readings < 90.  Call our office if you are concerned about your readings being too low.   Follow-up with Pharmacy Clinic in 3-4 weeks.

## 2017-06-20 NOTE — Progress Notes (Addendum)
Patient ID: Alicia West, female   DOB: Sep 08, 1970, 47 y.o.   MRN: [id number] Reviewed: Agree with the documentation and management of Dr. Valentina Lucks.   Lipid Panel revealed  LDL > 100  Consider addition of statin at next visit.

## 2017-06-20 NOTE — Assessment & Plan Note (Signed)
Hypertension longstanding since 2012 currently uncontrolled with BP >140/90.  Patient reports adherence with medication. Control is suboptimal due to resistant HTN and sub-optimized therapy regimen.  Increased carvedilol to from 12.51m BID to 298mBID. Continued losartan-HCTZ 100-2521maily and spironolactone 69m38mily. Obtained BMET today. Consider increasing dose of spironolactone at next visit.

## 2017-06-20 NOTE — Progress Notes (Signed)
S:     Chief Complaint  Patient presents with  . Medication Management    DM   Patient arrives ambulating without assistance with a tired affect.  Presents for diabetes evaluation, education, and management at the request of [name] on 04/19/2017. Patient increased her Victoza dose from 0.50m daily to 1.223mdaily. Denies n/v with Victoza 1.2 mg daily but reports she is hesitant to increase to 1.8 mg d/t h/o severe anorexia at that dose. Reports feeling better since changing metoprolol to carvedilol. She only checks her glucose levels when she feels low and reports some hypoglycemic events resulting in shakiness when her BG is in the 90s. 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  Patient reports adherence with medications.  Current diabetes medications include: metformin 500 mg BID (could not tolerate higher dose), Victoza 1.82m71maily, Lantus 40 units in the morning Current hypertension medications include: carvedilol 12.5mg60mD, losartan-HCTZ 100-25mg53mly, spironolactone 25mg 53my  Patient reported dietary habits: Eats 2 meals/day Breakfast: Glucerna shake Lunch: none, usually snacks on "something quick like chips" Dinner:chicken, vegetables Snacks:chips Drinks: water, gatorade, and 1 can of regular soda daily in divided servings. This is improved from drinking 'a lot of Kool-Aid" in the past.  Patient reported exercise habits: none   Patient reports nocturia 2-3 times nightly, which is improved from 5-6 times nightly in the past. Patient reports visual changes: worsening blurred vision. Patient plans to follow up with optometrist.  O:  Physical Exam  Constitutional: She appears well-developed and well-nourished.    Review of Systems  Eyes: Positive for blurred vision.  All other systems reviewed and are negative.    Lab Results  Component Value Date   HGBA1C 10.1 04/12/2017   Vitals:   06/20/17 0844  BP: (!) 152/92  Pulse: 93  SpO2:  98%    Home CBGs: ~90s-390s Home BPs: 130-150/80-102  10 year ASCVD risk: 14.3% (lipids from 2015)  A/P: Diabetes longstanding since 2011 currently uncontrolled with high CBG readings and A1c > 7%. Patient reports some hypoglycemic symptoms when her blood glucose is in the 90s-120s. She is able to verbalize appropriate hypoglycemia management plan. Patient reports adherence with medication. Control is suboptimal due to her diet, lack of exercise, and increased stress from schoolwork and exams.  Continued basal insulin Lantus (insulin glargine) 40 units once daily in the morning. Instructed patient to decrease to Lantus 35 units daily if her CBGs are consistently <100.  Increased dose of Victoza (liraglutide) from 1.82mg da56m gradually toward 1.8mg dai382m Patient will slowly increase 1 click every few days from 1.2 mg as she feels comfortable. Patient was more agreeable to slowly increasing the dose as she felt comfortable and expressed understanding to titration plan.  Continued metformin 500mg BID25mpatient endorses severe nausea with higher doses.  Next A1C anticipated 07/2017.    ASCVD risk greater than 7.5%. Continued Aspirin 81 mg. Obtained lipid panel today. Consider addition of rosuvastatin 20-40mg at next visit pending lipid panel.  Hypertension longstanding since 2012 currently uncontrolled with BP >140/90.  Patient reports adherence with medication. Control is suboptimal due to resistant HTN and sub-optimized therapy regimen.  Increased carvedilol to from 12.5mg BID t29m5mg BID. 28minued losartan-HCTZ 100-25mg daily 21mspironolactone 25mg daily. 83mined BMET today. Consider increasing dose of spironolactone at next visit.  Written patient instructions provided.  Total time in face to face counseling 45 minutes.   Follow up in Pharmacist Clinic Visit in 3-4  weeks.   Patient seen with Richardine Service, PharmD Candidate, Angus Seller, PharmD, PGY1 Pharmacy Resident. and Deirdre Pippins,  PharmD, BCPS, PGY2 Pharmacy Resident.

## 2017-06-21 LAB — BASIC METABOLIC PANEL
BUN/Creatinine Ratio: 14 (ref 9–23)
BUN: 16 mg/dL (ref 6–24)
CALCIUM: 10 mg/dL (ref 8.7–10.2)
CO2: 24 mmol/L (ref 20–29)
CREATININE: 1.11 mg/dL — AB (ref 0.57–1.00)
Chloride: 100 mmol/L (ref 96–106)
GFR calc Af Amer: 68 mL/min/{1.73_m2} (ref >59)
GFR, EST NON AFRICAN AMERICAN: 59 mL/min/{1.73_m2} — AB (ref >59)
GLUCOSE: 146 mg/dL — AB (ref 65–99)
Potassium: 4.3 mmol/L (ref 3.5–5.2)
Sodium: 138 mmol/L (ref 134–144)

## 2017-06-21 LAB — LIPID PANEL
CHOL/HDL RATIO: 5.2 ratio — AB (ref 0.0–4.4)
CHOLESTEROL TOTAL: 188 mg/dL (ref 100–199)
HDL: 36 mg/dL — AB (ref >39)
LDL CALC: 118 mg/dL — AB (ref 0–99)
TRIGLYCERIDES: 172 mg/dL — AB (ref 0–149)
VLDL Cholesterol Cal: 34 mg/dL (ref 5–40)

## 2017-06-26 ENCOUNTER — Telehealth: Payer: Self-pay

## 2017-06-26 NOTE — Telephone Encounter (Signed)
Patient left message on nurse line that at appt with Dr Valentina Lucks her BP medication dose was increased. She believes her fluid pill dose was to be increased also but it is not reflected on her AVS and nothing has been sent to her pharmacy.   Call back is [phone].  [name], RN [facility] RN)

## 2017-06-27 ENCOUNTER — Other Ambulatory Visit: Payer: Self-pay | Admitting: Pharmacist

## 2017-06-28 NOTE — Telephone Encounter (Signed)
Clarified dosing of medications with hipaa compliant message. Requested patient call back if she has additional questions.   Plan at last visit was increase carvedilol, obtain BMET (normal-stable) and consider increasing spironolactone.  Reevaluate BP at next visit and then consider increase in spironolactione dose.

## 2017-07-04 ENCOUNTER — Other Ambulatory Visit: Payer: Self-pay | Admitting: Pharmacist

## 2017-07-09 ENCOUNTER — Telehealth: Payer: Self-pay

## 2017-07-09 NOTE — Telephone Encounter (Signed)
Patient left message on nurse line that her insurance is re-instated but she is waiting on something in the mail to show this.  Requests 1 or 2 samples of Levemir.  We have Levemir vials and pens, however, Lantus is on med list, not Levemir.  Will route note to PCP to approve or deny sample and also to clarify what to give.  Patient call back is [phone]  [name], RN [facility] RN)

## 2017-07-10 NOTE — Telephone Encounter (Signed)
Please provide patient with Levemir pens.  She should use 30 units twice daily.  Please provide 2 pens which should last for 2 weeks.  Thanks!

## 2017-07-11 NOTE — Telephone Encounter (Signed)
2 pens are in the refridge for pt. I have called pt but she did not answer. If pt calls please let her know she can come by and pick the pens up. Ottis Stain, CMA

## 2017-07-11 NOTE — Telephone Encounter (Signed)
Pt informed. Taheerah Guldin T Chealsey Miyamoto, CMA  

## 2017-09-19 ENCOUNTER — Ambulatory Visit: Payer: Self-pay

## 2017-09-20 ENCOUNTER — Telehealth: Payer: Self-pay | Admitting: Family Medicine

## 2017-09-20 ENCOUNTER — Ambulatory Visit: Payer: Self-pay

## 2017-09-27 ENCOUNTER — Other Ambulatory Visit: Payer: Self-pay | Admitting: Family Medicine

## 2017-09-30 ENCOUNTER — Encounter: Payer: Self-pay | Admitting: Family Medicine

## 2017-09-30 ENCOUNTER — Other Ambulatory Visit: Payer: Self-pay

## 2017-09-30 ENCOUNTER — Ambulatory Visit (INDEPENDENT_AMBULATORY_CARE_PROVIDER_SITE_OTHER): Payer: BLUE CROSS/BLUE SHIELD | Admitting: Family Medicine

## 2017-09-30 VITALS — BP 134/86 | HR 111 | Temp 98.4°F | Ht 69.0 in | Wt 250.2 lb

## 2017-09-30 DIAGNOSIS — L709 Acne, unspecified: Secondary | ICD-10-CM | POA: Diagnosis not present

## 2017-09-30 DIAGNOSIS — N63 Unspecified lump in unspecified breast: Secondary | ICD-10-CM | POA: Diagnosis not present

## 2017-09-30 DIAGNOSIS — L989 Disorder of the skin and subcutaneous tissue, unspecified: Secondary | ICD-10-CM | POA: Diagnosis not present

## 2017-09-30 NOTE — Progress Notes (Signed)
 *  breast exam performed by Dr. Owens Shark Subjective:  Alicia West is a 47 y.o. female who presents to the [facility] today with a chief complaint of breast lump.   HPI: Patient with breast lump in right breast immediately lateral to nipple that "comes and goes for years".  It is painful on occasion, and seems to sometimes be timed with her cycle.  There is no skin dimpling/discoloration/drainage.  She has had a prior mammorgram for this location in 2016 that showed inflammation and potentially a sebaceous cyst.  She has not had a biopsy or I/D.   She does have a history of breast reduction that has healed appropriately.  She also complains of skin lesion on sternum that she says is "from her diabetes".  She claims whenever she gets a knick or small wound that she often gets moles/lesions although she can't currently show any other and doesn't remember specifically how she might have knicked her sternum.  She mentions severe acne that she feels the need to cover with significant amounts of makeup or "I can't go out around people".  She says she has tried "everything"   Objective:  Physical Exam: BP 134/86   Pulse (!) 111   Temp 98.4 F (36.9 C) (Oral)   Ht 5' 9"  (1.753 m)   Wt 250 lb 3.2 oz (113.5 kg)   LMP 09/06/2017 (Exact Date)   SpO2 99%   BMI 36.95 kg/m   Gen: NAD, resting comfortably CV: RRR with no murmurs appreciated Pulm: NWOB, CTAB with no crackles, wheezes, or rhonchi GI: Normal bowel sounds present. Soft, Nontender, Nondistended. Breast exam: firm lump on right breast above and lateral to nipple consistent with potential fibrocystic breast Chest: irregular shaped, irregular colored, raised lesion on sternum app.2cm diameter.  Open but not purulent. MSK: no edema, cyanosis, or clubbing noted Skin: warm, dry Neuro: grossly normal, moves all extremities Psych: Normal affect and thought content  No results found for this or any previous visit (from the past 72  hour(s)).   Assessment/Plan:  Skin lesion of chest wall Possible non-healing wound although irregular color/shape and claim of increasing size is concerning to her for skin cancer.    Will refer to derm for biopsy  Acne Patient so concern abotu acne she will not leave house without significant amounts of makeup.  It is impacting her emotionally and she would like to see dermatologist.  Breast lump Prior workup in 2016 but lump now returning.  Could be fibrocystic breast given exam and fluctuating pain with cycle but patient is behind on screening exams  Will order diagnostic [place], [place] - PGY2 10/01/2017 10:01 PM

## 2017-09-30 NOTE — Patient Instructions (Signed)
It was a pleasure to see you today! Thank you for choosing [facility] for your primary care. Alicia West was seen for lump on breast. Come back to the clinic if you have any routine concerns, and go to the emergency room if you have any life threatening symptoms.  We are ordering your mammogram at the breast center, if you haven't heard from them in a week give them a call.   Please bring all your medications to every doctors visit   Sign up for My Chart to have easy access to your labs results, and communication with your Primary care physician.     Please check-out at the front desk before leaving the clinic.     Best,  Dr. Sherene Sires FAMILY MEDICINE RESIDENT - PGY2 09/30/2017 4:33 PM

## 2017-10-01 ENCOUNTER — Other Ambulatory Visit: Payer: Self-pay | Admitting: Family Medicine

## 2017-10-01 DIAGNOSIS — N63 Unspecified lump in unspecified breast: Secondary | ICD-10-CM

## 2017-10-01 DIAGNOSIS — L709 Acne, unspecified: Secondary | ICD-10-CM | POA: Insufficient documentation

## 2017-10-01 DIAGNOSIS — L700 Acne vulgaris: Secondary | ICD-10-CM | POA: Insufficient documentation

## 2017-10-01 HISTORY — DX: Acne vulgaris: L70.0

## 2017-10-01 NOTE — Assessment & Plan Note (Signed)
Prior workup in 2016 but lump now returning.  Could be fibrocystic breast given exam and fluctuating pain with cycle but patient is behind on screening exams  Will order diagnostic mammo

## 2017-10-01 NOTE — Assessment & Plan Note (Signed)
Patient so concern abotu acne she will not leave house without significant amounts of makeup.  It is impacting her emotionally and she would like to see dermatologist.

## 2017-10-01 NOTE — Assessment & Plan Note (Signed)
Possible non-healing wound although irregular color/shape and claim of increasing size is concerning to her for skin cancer.    Will refer to derm for biopsy

## 2017-10-04 ENCOUNTER — Ambulatory Visit
Admission: RE | Admit: 2017-10-04 | Discharge: 2017-10-04 | Disposition: A | Discharge status: Home / Self Care | Payer: BLUE CROSS/BLUE SHIELD | Source: Ambulatory Visit | Attending: Family Medicine | Admitting: Family Medicine

## 2017-10-04 ENCOUNTER — Encounter: Payer: Self-pay | Admitting: Family Medicine

## 2017-10-04 DIAGNOSIS — N63 Unspecified lump in unspecified breast: Secondary | ICD-10-CM

## 2017-10-04 IMAGING — US US BREAST*R* LIMITED INC AXILLA
1 series · 4 of 4 positions shown · non-contrast
Comparison: [DATE]

ACR Breast Density Category a: The breast tissue is almost entirely
fatty.

CLINICAL DATA: 46-year-old patient presents for annual examination
and evaluation a palpable lump in the outer right breast. History of
bilateral breast reduction.

EXAM:
DIGITAL DIAGNOSTIC BILATERAL MAMMOGRAM WITH CAD AND TOMO
ULTRASOUND RIGHT BREAST

[Series 1: us breast*right* limited inc axilla · 0.07mm/px · 4 of 4 slices shown]
[im 1/4]
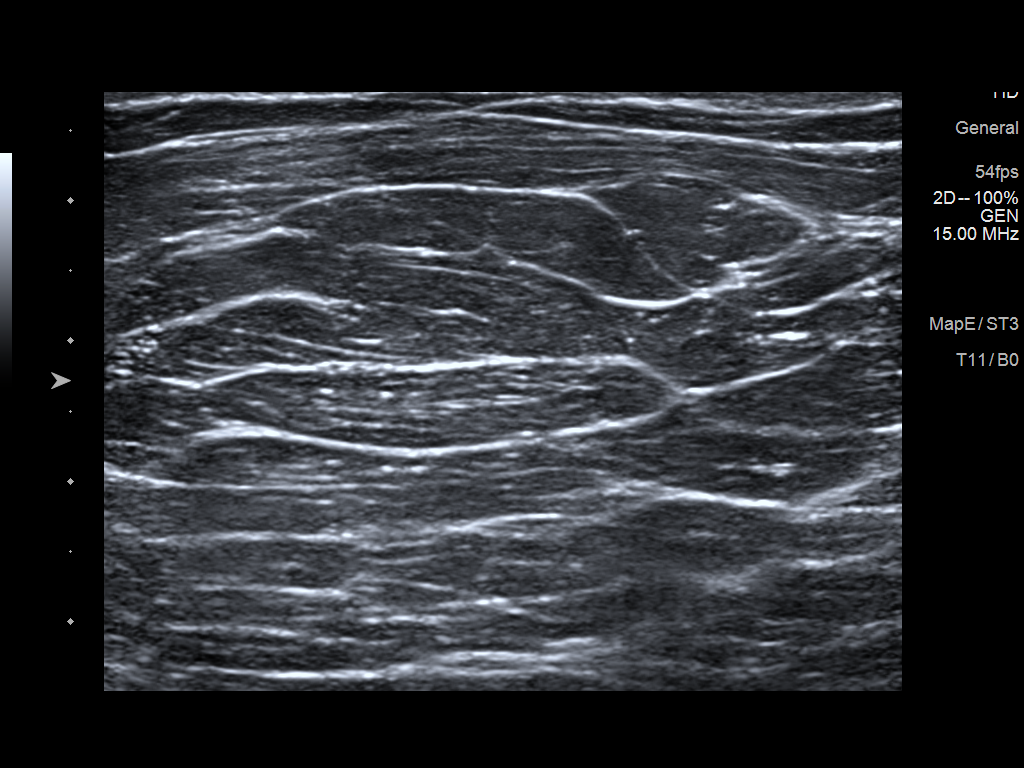
[im 2/4]
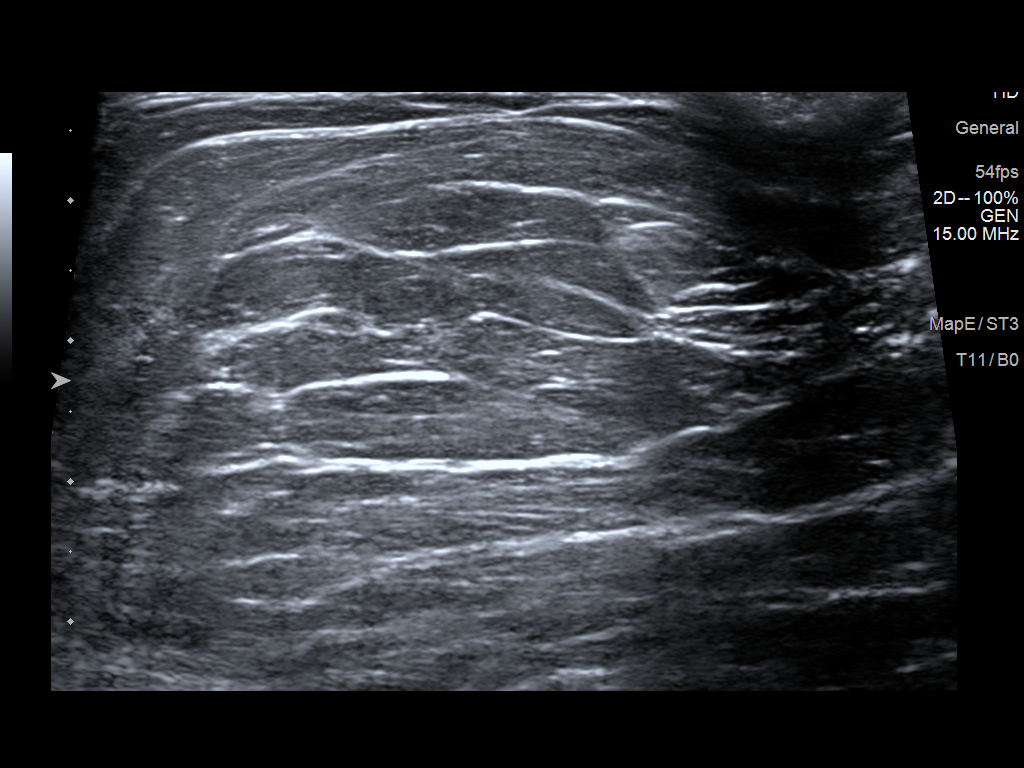
[im 3/4]
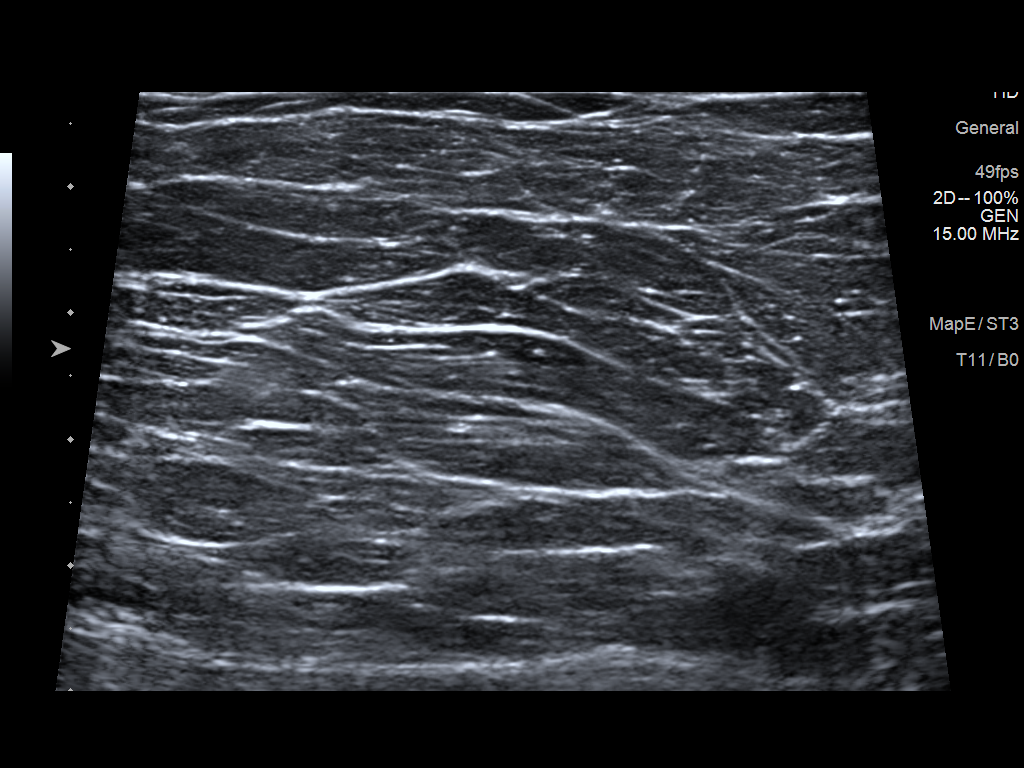
[im 4/4]
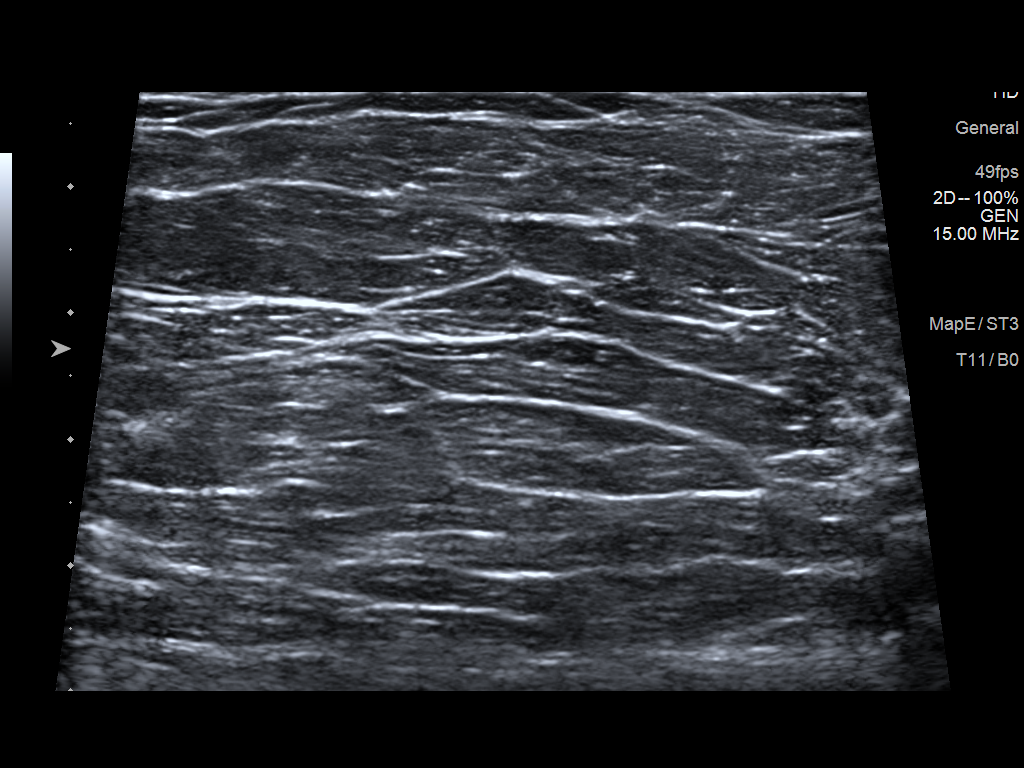

[4 of 4 positions shown; findings below may reference images not displayed]

FINDINGS: Metallic skin marker was placed in the region of palpable concern in
the outer right breast. Spot compression view of this region shows
fatty breast parenchyma. No suspicious findings are identified in
the region of palpable concern.

No mass, architectural distortion, or suspicious microcalcification
is identified to suggest malignancy in either breast.

Mammographic images were processed with CAD.

On physical exam, there is an oval focal area of slight firmness in
the 9 o'clock region of the right breast approximately 2 cm from the
nipple.

Targeted ultrasound is performed, showing fatty breast tissue in the
9 o'clock region of the right breast in the region of patient
concern. There is no solid or cystic mass. No focal fluid
collection. Normal skin thickness.
IMPRESSION: No evidence of malignancy in the right breast. Normal fatty tissue
is seen in the region of patient concern in the 9 o'clock position
of the right breast.

RECOMMENDATION:
Screening mammogram in one year.(Code:[1N])

I have discussed the findings and recommendations with the patient.
Results were also provided in writing at the conclusion of the
visit. If applicable, a reminder letter will be sent to the patient
regarding the next appointment.

BI-RADS CATEGORY  1: Negative.

## 2017-10-04 IMAGING — MG DIGITAL DIAGNOSTIC BILATERAL MAMMOGRAM WITH TOMO AND CAD
6 of 10 series · 6 of 30 positions shown · non-contrast
Comparison: [DATE]

ACR Breast Density Category a: The breast tissue is almost entirely
fatty.

CLINICAL DATA: 46-year-old patient presents for annual examination
and evaluation a palpable lump in the outer right breast. History of
bilateral breast reduction.

EXAM:
DIGITAL DIAGNOSTIC BILATERAL MAMMOGRAM WITH CAD AND TOMO
ULTRASOUND RIGHT BREAST

[R MLO synth-2D]
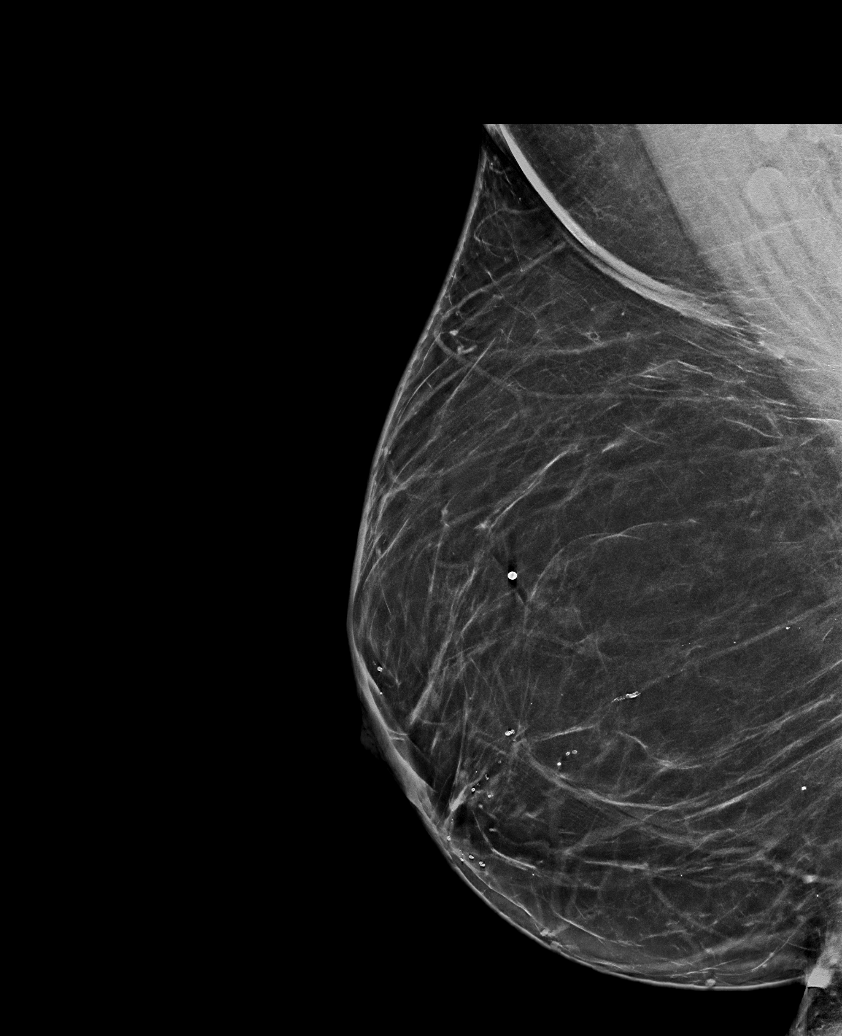

[R CC synth-2D (1 of 2)]
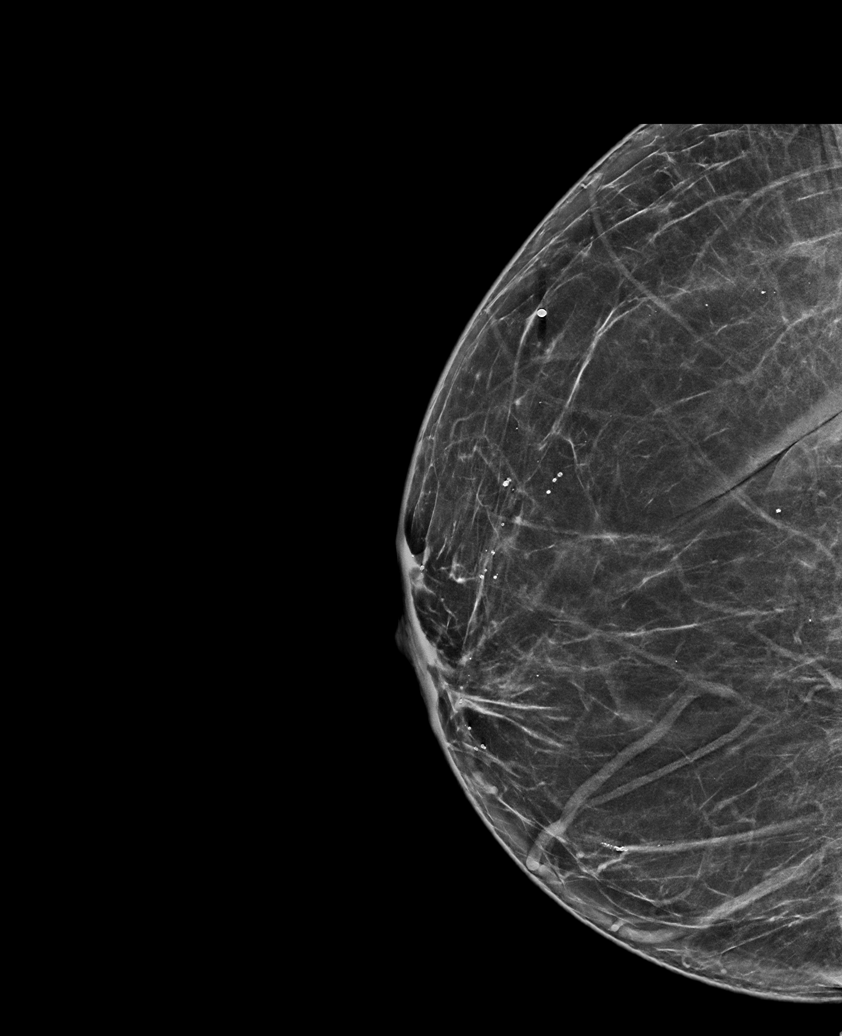

[L MLO synth-2D]
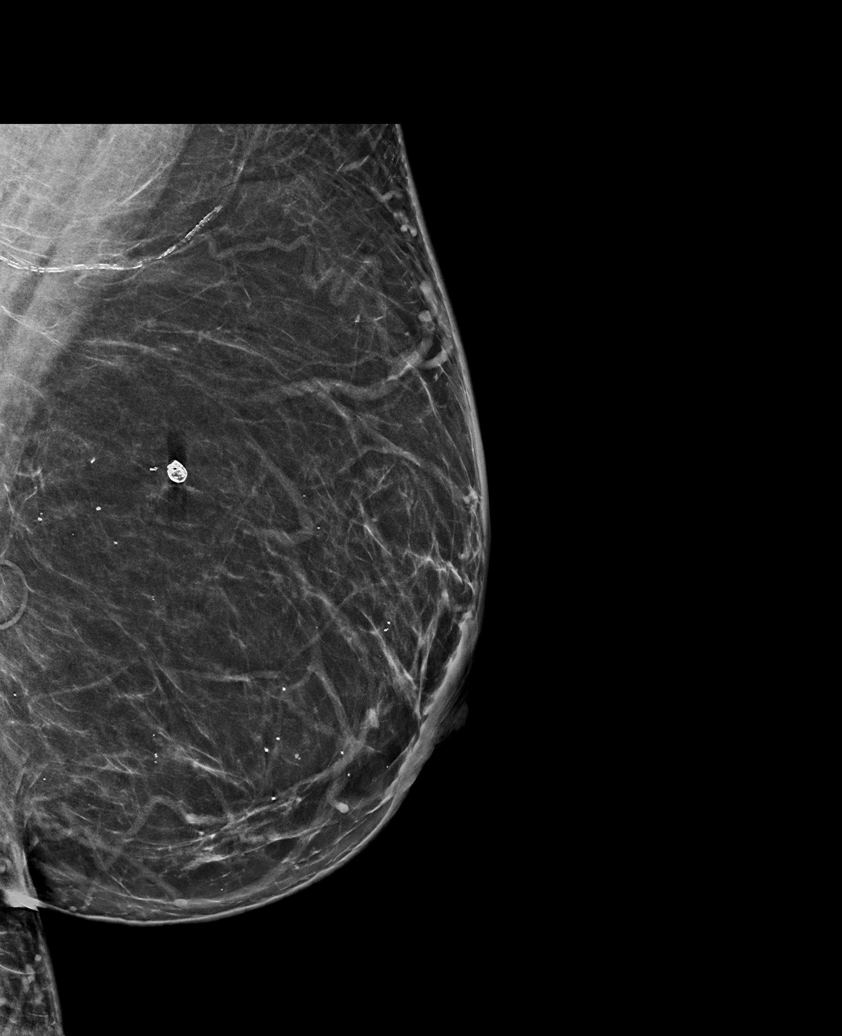

[L CC synth-2D]
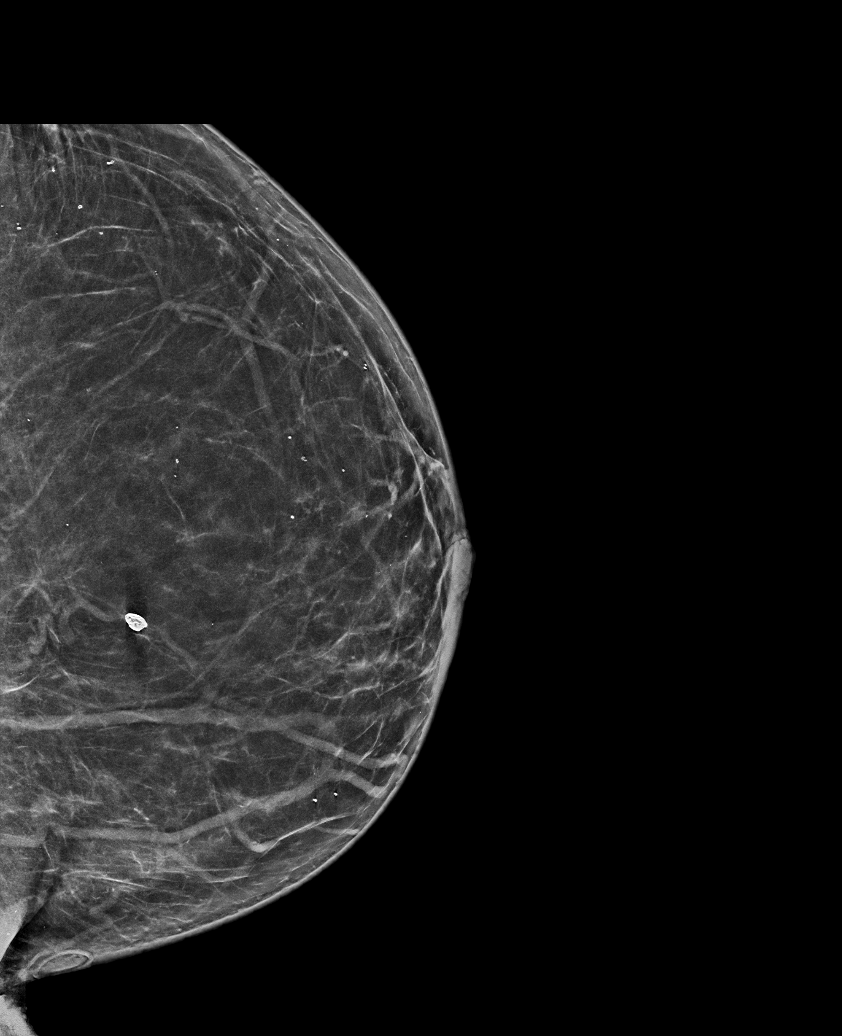

[R CC synth-2D (2 of 2)]
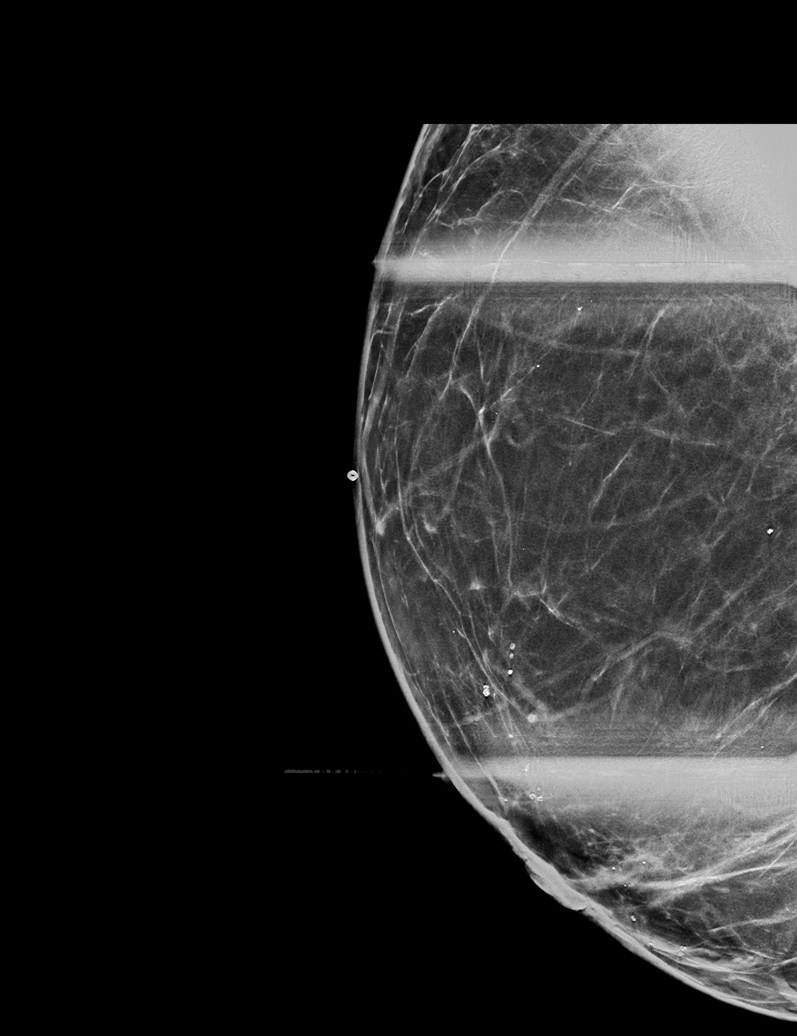

[L CC tomo · tomo slice 37/73.0]
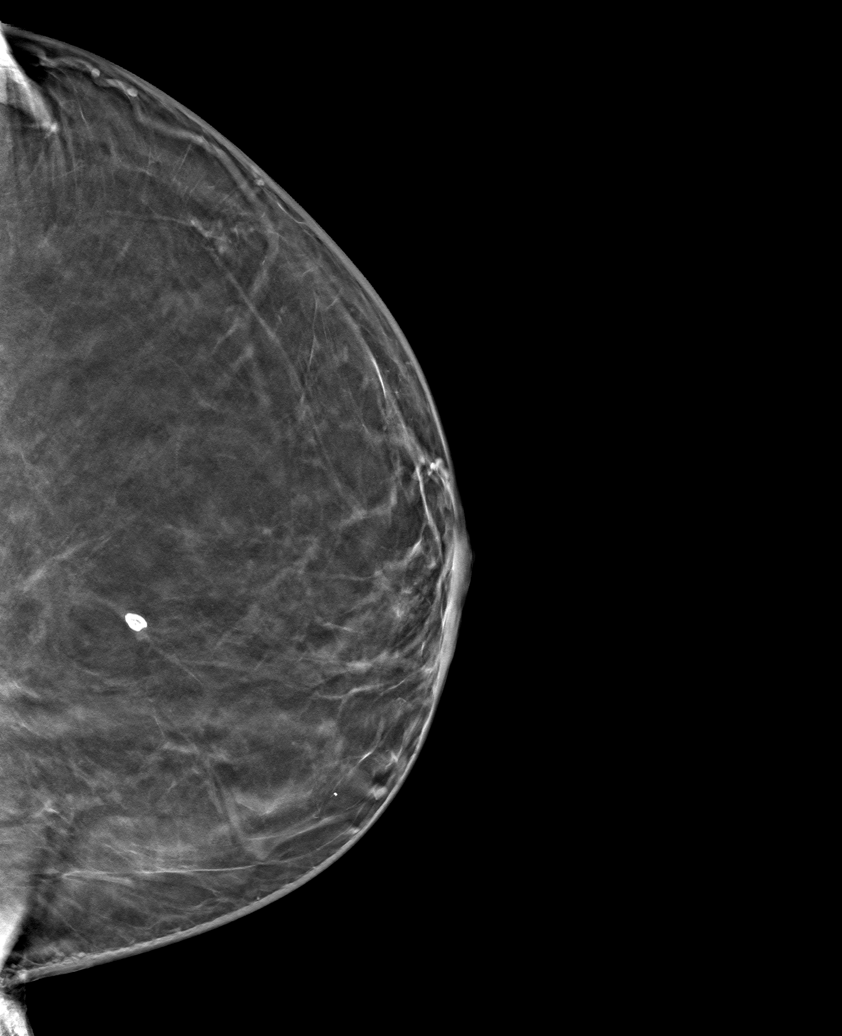

[6 of 30 positions shown; findings below may reference images not displayed]

FINDINGS: Metallic skin marker was placed in the region of palpable concern in
the outer right breast. Spot compression view of this region shows
fatty breast parenchyma. No suspicious findings are identified in
the region of palpable concern.

No mass, architectural distortion, or suspicious microcalcification
is identified to suggest malignancy in either breast.

Mammographic images were processed with CAD.

On physical exam, there is an oval focal area of slight firmness in
the 9 o'clock region of the right breast approximately 2 cm from the
nipple.

Targeted ultrasound is performed, showing fatty breast tissue in the
9 o'clock region of the right breast in the region of patient
concern. There is no solid or cystic mass. No focal fluid
collection. Normal skin thickness.
IMPRESSION: No evidence of malignancy in the right breast. Normal fatty tissue
is seen in the region of patient concern in the 9 o'clock position
of the right breast.

RECOMMENDATION:
Screening mammogram in one year.(Code:[1N])

I have discussed the findings and recommendations with the patient.
Results were also provided in writing at the conclusion of the
visit. If applicable, a reminder letter will be sent to the patient
regarding the next appointment.

BI-RADS CATEGORY  1: Negative.

## 2017-12-07 ENCOUNTER — Other Ambulatory Visit: Payer: Self-pay | Admitting: Family Medicine

## 2017-12-07 DIAGNOSIS — I10 Essential (primary) hypertension: Secondary | ICD-10-CM

## 2018-01-06 ENCOUNTER — Other Ambulatory Visit: Payer: Self-pay | Admitting: Family Medicine

## 2018-01-06 ENCOUNTER — Other Ambulatory Visit: Payer: Self-pay | Admitting: *Deleted

## 2018-01-06 DIAGNOSIS — E118 Type 2 diabetes mellitus with unspecified complications: Principal | ICD-10-CM

## 2018-01-06 DIAGNOSIS — IMO0002 Reserved for concepts with insufficient information to code with codable children: Secondary | ICD-10-CM

## 2018-01-06 DIAGNOSIS — Z794 Long term (current) use of insulin: Principal | ICD-10-CM

## 2018-01-06 DIAGNOSIS — E1165 Type 2 diabetes mellitus with hyperglycemia: Secondary | ICD-10-CM

## 2018-01-07 ENCOUNTER — Ambulatory Visit (INDEPENDENT_AMBULATORY_CARE_PROVIDER_SITE_OTHER): Payer: BLUE CROSS/BLUE SHIELD | Admitting: Family Medicine

## 2018-01-07 ENCOUNTER — Other Ambulatory Visit: Payer: Self-pay

## 2018-01-07 VITALS — BP 132/82 | HR 98 | Temp 98.5°F | Ht 69.0 in | Wt 249.4 lb

## 2018-01-07 DIAGNOSIS — M25571 Pain in right ankle and joints of right foot: Secondary | ICD-10-CM

## 2018-01-07 DIAGNOSIS — E1165 Type 2 diabetes mellitus with hyperglycemia: Secondary | ICD-10-CM

## 2018-01-07 DIAGNOSIS — M25579 Pain in unspecified ankle and joints of unspecified foot: Secondary | ICD-10-CM | POA: Insufficient documentation

## 2018-01-07 LAB — POCT GLYCOSYLATED HEMOGLOBIN (HGB A1C): HbA1c, POC (controlled diabetic range): 9.9 % — AB (ref 0.0–7.0)

## 2018-01-07 NOTE — Patient Instructions (Signed)
It was great to see you!  Our plans for today:  - We are obtaining a foot xray. - We are referring you to Podiatry. - The discoloration of your nail will eventually grow out. - We are getting an updated A1c today. Be sure to make an appointment with [name] to follow up on your diabetes.  Take care and seek immediate care sooner if you develop any concerns.   Dr. [facility]

## 2018-01-07 NOTE — Progress Notes (Signed)
Subjective:   Patient ID: ACHSAH MCQUADE    DOB: [birth year], 47 y.o. female   MRN: [id number]  Alicia West is a 47 y.o. female with a history of T2DM, HTN, h/o oral cancer, depression, IDA, obesity here for   Toenail complaint Endorses red coloring to R great toenail, is unsure how long it has been there.  Has history of type 2 diabetes and states she tries to take good care of her feet and keeps her toenails square and long.  She usually paints her toenails white.  Reports about a month ago she snagged her right great toenail on her bed she which took off some of her toenail polish and she noticed a red coloring to her toenail where her polish was.  Denies any known injuries or trauma.  Denies surrounding erythema, discharge from nail, or thickened nails.  No fevers.  Has been having some sharp pains in her right great toe, does have diabetic neuropathy.  Also reports the ball of her right foot feels swollen and has pain with walking.  States her diabetes is usually uncontrolled though without missed doses of medication.  She takes Victoza 1.2 mg, metformin 500 mg BID, Lantus 40 units in a.m. without missed doses.  Review of Systems:  Per HPI.  [place], medications and smoking status reviewed.  Objective:   BP 132/82   Pulse 98   Temp 98.5 F (36.9 C) (Oral)   Ht 5' 9"  (1.753 m)   Wt 249 lb 6 oz (113.1 kg)   SpO2 97%   BMI 36.83 kg/m  Vitals and nursing note reviewed.  General: Obese female, in no acute distress with non-toxic appearance Skin: warm, dry, no rashes or lesions Extremities: warm and well perfused, normal tone.  Right great toe with hemosiderin deposition under toenail without swelling, erythema, discharge, thickened nail.  Tender to palpation along head of third metatarsal. MSK: ROM grossly intact, strength intact, gait normal Neuro: Alert and oriented, speech normal  Assessment & Plan:   Pain in joint, ankle and foot Likely related to past trauma given  appearance of blood deposition under toenail though no clear inciting event.  No evidence of infection or onychomycosis.  Pain noted at head of third metatarsal with bony prominence.  Will obtain foot x-ray and referral to podiatry for evaluation for orthotics or injection.  History of uncontrolled diabetes, will obtain A1c today and encourage follow-up with PCP to prevent risk of future infection and worsening of neuropathy.  Microfilament exam performed today with intact sensation and peripheral pulses.  Orders Placed This Encounter  Procedures  . DG Foot Complete Right    Standing Status:   Future    Standing Expiration Date:   03/11/2019    Order Specific Question:   Reason for Exam (SYMPTOM  OR DIAGNOSIS REQUIRED)    Answer:   foot pain    Order Specific Question:   Is patient pregnant?    Answer:   No    Order Specific Question:   Preferred imaging location?    Answer:   [facility]    Order Specific Question:   Radiology Contrast Protocol - do NOT remove file path    Answer:   [email or web address]  . Ambulatory referral to Podiatry    Referral Priority:   Routine    Referral Type:   Consultation    Referral Reason:   Specialty Services Required    Requested Specialty:   Podiatry    Number of Visits  Requested:   1  . HgB A1c   No orders of the defined types were placed in this encounter.  Precepted with Dr. Owens Shark.  Rory Percy, DO PGY-2, [facility] 01/07/2018 7:08 PM

## 2018-01-07 NOTE — Assessment & Plan Note (Addendum)
Likely related to past trauma given appearance of blood deposition under toenail though no clear inciting event.  No evidence of infection or onychomycosis.  Pain noted at head of third metatarsal with bony prominence.  Will obtain foot x-ray and referral to podiatry for evaluation for orthotics or injection.  History of uncontrolled diabetes, will obtain A1c today and encourage follow-up with PCP to prevent risk of future infection and worsening of neuropathy.  Microfilament exam performed today with intact sensation and peripheral pulses.

## 2018-01-08 MED ORDER — SPIRONOLACTONE 25 MG PO TABS: 25.0000 mg | ORAL_TABLET | Freq: Every day | ORAL | 0 refills | Status: DC

## 2018-01-08 NOTE — Telephone Encounter (Signed)
White team, I am covering for [name] this week. Can you clarify with patient what her current insulin regimen is? She has lantus listed on her medication list but this is a request for levemir.  Thanks, Leeanne Rio, MD

## 2018-01-10 MED ORDER — INSULIN GLARGINE 100 UNIT/ML SOLOSTAR PEN: 40.0000 [IU] | PEN_INJECTOR | Freq: Every morning | SUBCUTANEOUS | 1 refills | Status: DC

## 2018-01-10 NOTE — Telephone Encounter (Signed)
Contacted pt and she said she is on Lantus 40 units and she takes it in the morning, this is what she needs refilled.  She said she got confused because she had some samples of the Levemir. Katharina Caper, April D, [place]

## 2018-01-31 ENCOUNTER — Other Ambulatory Visit: Payer: Self-pay

## 2018-01-31 ENCOUNTER — Ambulatory Visit: Payer: BLUE CROSS/BLUE SHIELD | Admitting: Family Medicine

## 2018-01-31 ENCOUNTER — Encounter: Payer: Self-pay | Admitting: Family Medicine

## 2018-01-31 VITALS — BP 144/92 | HR 104 | Temp 97.9°F | Ht 69.0 in | Wt 254.2 lb

## 2018-01-31 DIAGNOSIS — IMO0001 Reserved for inherently not codable concepts without codable children: Secondary | ICD-10-CM

## 2018-01-31 DIAGNOSIS — E1165 Type 2 diabetes mellitus with hyperglycemia: Secondary | ICD-10-CM

## 2018-01-31 DIAGNOSIS — M79674 Pain in right toe(s): Secondary | ICD-10-CM | POA: Diagnosis not present

## 2018-01-31 DIAGNOSIS — Z794 Long term (current) use of insulin: Secondary | ICD-10-CM

## 2018-01-31 MED ORDER — IBUPROFEN 600 MG PO TABS: 600.0000 mg | ORAL_TABLET | Freq: Three times a day (TID) | ORAL | 0 refills | Status: DC | PRN

## 2018-01-31 NOTE — Progress Notes (Signed)
Subjective:    Alicia West is a 47 y.o. female who presents to [facility] today for Toe issues:  1.  Toe issues: Patient seen here at the beginning of months with the same.  Pain in the right great toe.  She usually has peripheral neuropathy but has numbness tingling.  This is sharp stabbing pain.  No injury that she knows of.  Present since October of this year.  She had discoloration to her nail which she noted first.  This is slowly been growing out.  No real pain relief.  2.   Diabetes:  Currently on levemir, although she is taking Lantus currently.  No adverse effects from medication.  No hypoglycemic events.  Has baseline paresthesias which is numbness and tingling.  No polyuria/polydipsia.  Measures blood sugars at home every day or so.  Lab Results  Component Value Date   HGBA1C 9.9 (A) 01/07/2018    ROS as above per HPI.    The following portions of the patient's history were reviewed and updated as appropriate: allergies, current medications, past medical history, family and social history, and problem list. Patient is a nonsmoker.    PMH reviewed.  Past Medical History:  Diagnosis Date  . Diabetes mellitus   . Herpes simplex labialis   . Hypertension   . TB (tuberculosis) contact    + skin test,/- chest x-ray - in mid 2000's   Past Surgical History:  Procedure Laterality Date  . BREAST SURGERY     Reduction  . TONSILLECTOMY      Medications reviewed. Current Outpatient Medications  Medication Sig Dispense Refill  . aspirin 81 MG tablet Take 81 mg by mouth daily.    . B-D ULTRAFINE III SHORT PEN 31G X 8 MM MISC USE AS DIRECTED UP TO 4 TIMES DAILY 100 each 5  . benzonatate (TESSALON) 100 MG capsule Take 1 capsule (100 mg total) by mouth 2 (two) times daily as needed for cough. 20 capsule 0  . carvedilol (COREG) 25 MG tablet TAKE ONE TABLET BY MOUTH TWICE A DAY WITH A MEAL 60 tablet 2  . cetirizine (ZYRTEC) 10 MG tablet Take 10 mg by mouth daily as needed for allergies.      Marland Kitchen gabapentin (NEURONTIN) 100 MG capsule TAKE 1 CAPSULE BY MOUTH EVERY NIGHT AT BEDTIME 30 capsule 0  . glucose blood (BAYER CONTOUR NEXT TEST) test strip 1 each by Other route 3 (three) times daily. Use as instructed 100 each 12  . ibuprofen (ADVIL,MOTRIN) 800 MG tablet TAKE 1 TABLET BY MOUTH EVERY 8 HOURS AS NEEDED 30 tablet 1  . Insulin Glargine (LANTUS SOLOSTAR) 100 UNIT/ML Solostar Pen Inject 40 Units into the skin every morning. 15 mL 1  . Lancets MISC Use Contour Next Lancet to check blood glucose 3 times a day.  Dx code E11.65. 100 each 12  . lidocaine (XYLOCAINE) 2 % solution Use as directed 1 mL in the mouth or throat as needed for mouth pain. (Patient not taking: Reported on 05/09/2017) 15 mL 1  . liraglutide (VICTOZA) 18 MG/3ML SOPN Inject 0.2-0.3 mLs (1.2-1.8 mg total) into the skin daily. 3 pen 0  . losartan-hydrochlorothiazide (HYZAAR) 100-25 MG tablet TAKE ONE TABLET BY MOUTH DAILY 90 tablet 2  . metFORMIN (GLUCOPHAGE) 500 MG tablet TAKE ONE TABLET BY MOUTH TWICE A DAY WITH A MEAL 60 tablet 1  . NOVOTWIST 32G X 5 MM MISC USE ALONG WITH INSULIN INJECTIONS UP TO 4 X DAILY 100 each 4  .  polyethylene glycol (MIRALAX / GLYCOLAX) packet Take 17 g by mouth daily. (Patient taking differently: Take 17 g by mouth daily as needed. ) 14 each 3  . senna (SENOKOT) 8.6 MG TABS tablet Take 1 tablet (8.6 mg total) by mouth daily. (Patient taking differently: Take 1 tablet by mouth daily as needed. ) 120 each 0  . spironolactone (ALDACTONE) 25 MG tablet Take 1 tablet (25 mg total) by mouth daily. 90 tablet 0  . Syringe, Disposable, 1 ML MISC 1 each by Does not apply route 3 (three) times daily. 100 each 0  . ULTICARE MINI PEN NEEDLES 31G X 6 MM MISC Use to inject insulin subQ once daily for diabetes mellitus type 2 100 each 2  . valACYclovir (VALTREX) 500 MG tablet Take 1 tablet (500 mg total) by mouth 2 (two) times daily. FOR 5 DAYS WITH OUTBREAK (Patient not taking: Reported on 04/25/2017) 20 tablet  2   No current facility-administered medications for this visit.      Objective:   Physical Exam BP (!) 144/92   Pulse (!) 104   Temp 97.9 F (36.6 C) (Oral)   Ht 5' 9"  (1.753 m)   Wt 254 lb 3.2 oz (115.3 kg)   LMP 01/06/2018 (Approximate)   SpO2 99%   BMI 37.54 kg/m  Gen:  Alert, cooperative patient who appears stated age in no acute distress.  Vital signs reviewed. HEENT: EOMI,  MMM Feet: Right great toe tender to palpation along the first MTP joint.  No actual erythema here.  Tender palpation with deep palpation.  She does have what appears to be bruising at the end of her great toe.  She is a picture of previous.  It is grown out since then.  Impression/plan: 1.  Right great toe pain: -Not clear injury. -Agree this seems to be most likely traumatic although she has no areas of event. -She does have neuropathy although she has good sensation intact today. -X-ray that was previously ordered. -Follow-up with podiatry. -Ibuprofen for pain relief. - checking uric acid though less likely -- chronic gout??  2.  Diabetes: -We will continue for Lantus for now.  I discussed with her the benefits of this versus Levemir and she would like to continue Lantus.

## 2018-01-31 NOTE — Patient Instructions (Signed)
It was good to see you again today.  For now, we'll keep going with the Lantus.  Go for the x-ray of your foot.  I'll call with the results.  Take the ibuprofen every 6-8 hours as needed for pain.  Rechecking your uric acid level today assess for gout.  Make sure to keep the appointment with podiatry.  Have a great New Years!

## 2018-02-01 LAB — URIC ACID: Uric Acid: 4.9 mg/dL (ref 2.5–7.1)

## 2018-03-06 ENCOUNTER — Other Ambulatory Visit: Payer: Self-pay

## 2018-03-07 MED ORDER — GLUCOSE BLOOD VI STRP: ORAL_STRIP | 12 refills | Status: DC

## 2018-03-12 ENCOUNTER — Ambulatory Visit (INDEPENDENT_AMBULATORY_CARE_PROVIDER_SITE_OTHER): Payer: Managed Care, Other (non HMO)

## 2018-03-12 ENCOUNTER — Encounter: Payer: Self-pay | Admitting: Podiatry

## 2018-03-12 ENCOUNTER — Ambulatory Visit (INDEPENDENT_AMBULATORY_CARE_PROVIDER_SITE_OTHER): Payer: Managed Care, Other (non HMO) | Admitting: Podiatry

## 2018-03-12 VITALS — BP 154/97

## 2018-03-12 DIAGNOSIS — M779 Enthesopathy, unspecified: Secondary | ICD-10-CM

## 2018-03-12 DIAGNOSIS — M79671 Pain in right foot: Secondary | ICD-10-CM

## 2018-03-12 DIAGNOSIS — M7751 Other enthesopathy of right foot: Secondary | ICD-10-CM

## 2018-03-12 MED ORDER — METHYLPREDNISOLONE 4 MG PO TBPK: ORAL_TABLET | ORAL | 0 refills | Status: DC

## 2018-03-12 MED ORDER — MELOXICAM 15 MG PO TABS: 15.0000 mg | ORAL_TABLET | Freq: Every day | ORAL | 0 refills | Status: DC

## 2018-03-15 ENCOUNTER — Other Ambulatory Visit: Payer: Self-pay | Admitting: Family Medicine

## 2018-03-15 DIAGNOSIS — I10 Essential (primary) hypertension: Secondary | ICD-10-CM

## 2018-03-16 NOTE — Progress Notes (Signed)
   HPI: 48 year old female presenting today as a new patient with a chief complaint of right foot pain that began about one year ago. She states the pain is located on the plantar aspect of the forefoot and describes it as aching. Walking and applying pressure to the foot increases the pain. She has not done anything for treatment. Patient is here for further evaluation and treatment.   Past Medical History:  Diagnosis Date  . Diabetes mellitus   . Herpes simplex labialis   . Hypertension   . TB (tuberculosis) contact    + skin test,/- chest x-ray - in mid 2000's     Physical Exam: General: The patient is alert and oriented x3 in no acute distress.  Dermatology: Skin is warm, dry and supple bilateral lower extremities. Negative for open lesions or macerations.  Vascular: Palpable pedal pulses bilaterally. No edema or erythema noted. Capillary refill within normal limits.  Neurological: Epicritic and protective threshold grossly intact bilaterally.   Musculoskeletal Exam: Tenderness to palpation of the 3rd MPJ of the right foot. Range of motion within normal limits to all pedal and ankle joints bilateral. Muscle strength 5/5 in all groups bilateral.   Radiographic Exam:  Normal osseous mineralization. Joint spaces preserved. No fracture/dislocation/boney destruction.    Assessment: 1. 3rd MPJ capsulitis right    Plan of Care:  1. Patient evaluated. X-Rays reviewed.  2. Injection of 0.5 mLs Cel[place] Soluspan injected into the 3rd MPJ of the right foot.  3. Prescription for Medrol Dose Pak provided to patient. 4. Prescription for Meloxicam provided to patient. 5. Met pads dispensed.  6. Return to clinic as needed.      Edrick Kins, DPM [facility]  Dr. Edrick Kins, DPM    [address], [place] [address]                Office [phone]  Fax [phone]

## 2018-03-17 ENCOUNTER — Telehealth: Payer: Self-pay | Admitting: *Deleted

## 2018-03-17 MED ORDER — BASAGLAR KWIKPEN 100 UNIT/ML ~~LOC~~ SOPN: 40.0000 [IU] | PEN_INJECTOR | Freq: Every day | SUBCUTANEOUS | 3 refills | Status: DC

## 2018-03-17 NOTE — Telephone Encounter (Signed)
I'll switch her to [employer], which contains the same medication as Lantus and evidently is covered by her insurance.

## 2018-03-17 NOTE — Telephone Encounter (Signed)
Received fax from pharmacy, PA needed on Lantus solostar. See info below per covermymeds    If you would still like to attempt PA, let RN team know. Fleeger, Salome Spotted, [place] My Meds info: Key: [id number]   Fleeger, Salome Spotted, [place]

## 2018-03-18 ENCOUNTER — Other Ambulatory Visit: Payer: Self-pay | Admitting: Podiatry

## 2018-03-18 DIAGNOSIS — M779 Enthesopathy, unspecified: Secondary | ICD-10-CM

## 2018-04-02 ENCOUNTER — Telehealth: Payer: Self-pay | Admitting: *Deleted

## 2018-04-02 NOTE — Telephone Encounter (Signed)
Pt calls requesting an alternative to Victoza.  This is going to cost her $199 a month.   I contacted pharmacy, this med does not require a PA so they think it has to do with a deductible with the insurance.  Will forward to MD for medcation change if appropriate. Dalin Caldera, Salome Spotted, CMA

## 2018-04-04 NOTE — Telephone Encounter (Signed)
The patient's insurance has changed multiple times.  We have done lantus samples, lantus paid for by insurance, etc.  Please have the patient call to schedule a Pharmacy clinic appt to see if there are any alteratives to [place].  CBGs not well controlled.

## 2018-04-08 NOTE — Telephone Encounter (Signed)
LVM to call office back to inform her of below and to see about getting her an appointment with Dr. Valentina Lucks to discuss possible alternatives. Katharina Caper, Shanese Riemenschneider D, [place]

## 2018-04-09 ENCOUNTER — Ambulatory Visit (INDEPENDENT_AMBULATORY_CARE_PROVIDER_SITE_OTHER): Payer: Managed Care, Other (non HMO) | Admitting: Podiatry

## 2018-04-09 ENCOUNTER — Encounter: Payer: Self-pay | Admitting: Podiatry

## 2018-04-09 DIAGNOSIS — M7751 Other enthesopathy of right foot: Secondary | ICD-10-CM

## 2018-04-09 NOTE — Telephone Encounter (Signed)
Pt informed of below, she stated she was going to call insurance to see if they have an alternative and if not she would call and schedule with pharmacy clinic.Katharina Caper, April D, [place]

## 2018-04-14 NOTE — Progress Notes (Signed)
   HPI: 48 year old female presenting today for follow up evaluation of capsulitis of the 3rd MPJ of the right foot. She states the pain started to worsen about two weeks ago. She also notes the same pain to the plantar aspect of the foot. She states she took the Medrol but reports it made her more aggressive and hungry. There are no modifying factors noted. Patient is here for further evaluation and treatment.   Past Medical History:  Diagnosis Date  . Diabetes mellitus   . Herpes simplex labialis   . Hypertension   . TB (tuberculosis) contact    + skin test,/- chest x-ray - in mid 2000's     Physical Exam: General: The patient is alert and oriented x3 in no acute distress.  Dermatology: Skin is warm, dry and supple bilateral lower extremities. Negative for open lesions or macerations.  Vascular: Palpable pedal pulses bilaterally. No edema or erythema noted. Capillary refill within normal limits.  Neurological: Epicritic and protective threshold grossly intact bilaterally.   Musculoskeletal Exam: Tenderness to palpation of the 3rd MPJ of the right foot. Range of motion within normal limits to all pedal and ankle joints bilateral. Muscle strength 5/5 in all groups bilateral.   Assessment: 1. 3rd MPJ capsulitis right    Plan of Care:  1. Patient evaluated.   2. Injection of 0.5 mLs Celestone Soluspan injected into the 3rd MPJ of the right foot.  3. Continue taking Meloxicam daily.  4. Post op shoe dispensed.  5. Appointment with Liliane Channel, Pedorthist, for custom molded orthotics.  6. Return to clinic in 4 weeks.   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       Edrick Kins, DPM [facility]  Dr. Edrick Kins, DPM    [address], [place] [address]                Office [phone]  Fax [phone]

## 2018-04-16 ENCOUNTER — Other Ambulatory Visit: Payer: Self-pay

## 2018-04-16 ENCOUNTER — Ambulatory Visit (INDEPENDENT_AMBULATORY_CARE_PROVIDER_SITE_OTHER): Payer: Managed Care, Other (non HMO) | Admitting: Family Medicine

## 2018-04-16 ENCOUNTER — Encounter: Payer: Self-pay | Admitting: Family Medicine

## 2018-04-16 VITALS — BP 130/84 | HR 110 | Temp 98.8°F | Wt 247.0 lb

## 2018-04-16 DIAGNOSIS — J321 Chronic frontal sinusitis: Secondary | ICD-10-CM | POA: Diagnosis not present

## 2018-04-16 DIAGNOSIS — E1165 Type 2 diabetes mellitus with hyperglycemia: Secondary | ICD-10-CM

## 2018-04-16 DIAGNOSIS — R6889 Other general symptoms and signs: Secondary | ICD-10-CM | POA: Diagnosis not present

## 2018-04-16 LAB — POCT GLYCOSYLATED HEMOGLOBIN (HGB A1C): HBA1C, POC (CONTROLLED DIABETIC RANGE): 11.1 % — AB (ref 0.0–7.0)

## 2018-04-16 LAB — POCT INFLUENZA A/B
Influenza A, POC: NEGATIVE
Influenza B, POC: NEGATIVE

## 2018-04-16 MED ORDER — DOXYCYCLINE HYCLATE 100 MG PO TABS: 100.0000 mg | ORAL_TABLET | Freq: Two times a day (BID) | ORAL | 0 refills | Status: DC

## 2018-04-16 NOTE — Patient Instructions (Signed)
Take the doxycycline twice daily for the upper respiratory infection.  This will also help with your sinuses.  Let me know if this isn't any better in the next 3-4 days.  If you start feeling worse, of course come back or go to the ED.  Come back once you're feeling better and we can talk about your diabetes so more.    It's been good to take care of you!

## 2018-04-16 NOTE — Progress Notes (Signed)
Subjective:    Alicia West is a 48 y.o. female who presents to [facility] today for URI symptoms:  1.  URI:  Symptoms present for 1-2 weeks.  Describes thick drainage of mucus from nose and sinus congestion, cough productive of thick green mucus.  No actual temperature taken but feels sweaty and chilled especially at night but during the day as well.  Has been taking mucines, cough drops, theraflu without much relief.  Husband also sick.  Other sick contacts are coworkers.    2.  Diabetes:  Currently on basaglar 40 units daily.   No adverse effects from medication.  No hypoglycemic events.  No paresthesia or peripheral nerve pain.  No polyuria/polydipsia.  Measures blood sugars at home every day.  Have been running a little higher since she's been sick.       Lab Results  Component Value Date   HGBA1C 11.1 (A) 04/16/2018   ROS as above per HPI.    The following portions of the patient's history were reviewed and updated as appropriate: allergies, current medications, past medical history, family and social history, and problem list. Patient is a nonsmoker.    PMH reviewed.  Past Medical History:  Diagnosis Date  . Diabetes mellitus   . Herpes simplex labialis   . Hypertension   . TB (tuberculosis) contact    + skin test,/- chest x-ray - in mid 2000's   Past Surgical History:  Procedure Laterality Date  . BREAST SURGERY     Reduction  . TONSILLECTOMY      Medications reviewed. Current Outpatient Medications  Medication Sig Dispense Refill  . aspirin 81 MG tablet Take 81 mg by mouth daily.    . B-D ULTRAFINE III SHORT PEN 31G X 8 MM MISC USE AS DIRECTED UP TO 4 TIMES DAILY 100 each 5  . benzonatate (TESSALON) 100 MG capsule Take 1 capsule (100 mg total) by mouth 2 (two) times daily as needed for cough. 20 capsule 0  . carvedilol (COREG) 25 MG tablet TAKE ONE TABLET BY MOUTH TWICE A DAY WITH MEALS 180 tablet 1  . cetirizine (ZYRTEC) 10 MG tablet Take 10 mg by mouth daily as needed  for allergies.     Marland Kitchen gabapentin (NEURONTIN) 100 MG capsule TAKE 1 CAPSULE BY MOUTH EVERY NIGHT AT BEDTIME 30 capsule 0  . glucose blood (BAYER CONTOUR NEXT TEST) test strip Use to check blood sugar three times a day or as directed. 100 each 12  . ibuprofen (ADVIL,MOTRIN) 600 MG tablet Take 1 tablet (600 mg total) by mouth every 8 (eight) hours as needed. 30 tablet 0  . Insulin Glargine (BASAGLAR KWIKPEN) 100 UNIT/ML SOPN Inject 0.4 mLs (40 Units total) into the skin daily. Dispense QS x 1 month 3 mL 3  . Lancets MISC Use Contour Next Lancet to check blood glucose 3 times a day.  Dx code E11.65. 100 each 12  . LEVEMIR FLEXTOUCH 100 UNIT/ML Pen   2  . lidocaine (XYLOCAINE) 2 % solution Use as directed 1 mL in the mouth or throat as needed for mouth pain. 15 mL 1  . liraglutide (VICTOZA) 18 MG/3ML SOPN Inject 0.2-0.3 mLs (1.2-1.8 mg total) into the skin daily. 3 pen 0  . losartan-hydrochlorothiazide (HYZAAR) 100-25 MG tablet TAKE ONE TABLET BY MOUTH DAILY 90 tablet 2  . meloxicam (MOBIC) 15 MG tablet Take 1 tablet (15 mg total) by mouth daily. 30 tablet 0  . metFORMIN (GLUCOPHAGE) 500 MG tablet TAKE ONE TABLET  BY MOUTH TWICE A DAY WITH A MEAL 180 tablet 0  . methylPREDNISolone (MEDROL DOSEPAK) 4 MG TBPK tablet 6 day dose pack - take as directed 21 tablet 0  . NOVOTWIST 32G X 5 MM MISC USE ALONG WITH INSULIN INJECTIONS UP TO 4 X DAILY 100 each 4  . polyethylene glycol (MIRALAX / GLYCOLAX) packet Take 17 g by mouth daily. (Patient taking differently: Take 17 g by mouth daily as needed. ) 14 each 3  . senna (SENOKOT) 8.6 MG TABS tablet Take 1 tablet (8.6 mg total) by mouth daily. (Patient taking differently: Take 1 tablet by mouth daily as needed. ) 120 each 0  . spironolactone (ALDACTONE) 25 MG tablet Take 1 tablet (25 mg total) by mouth daily. 90 tablet 0  . Syringe, Disposable, 1 ML MISC 1 each by Does not apply route 3 (three) times daily. 100 each 0  . ULTICARE MINI PEN NEEDLES 31G X 6 MM MISC Use  to inject insulin subQ once daily for diabetes mellitus type 2 100 each 2  . valACYclovir (VALTREX) 500 MG tablet Take 1 tablet (500 mg total) by mouth 2 (two) times daily. FOR 5 DAYS WITH OUTBREAK 20 tablet 2   No current facility-administered medications for this visit.      Objective:   Physical Exam BP 130/84   Pulse (!) 110   Temp 98.8 F (37.1 C) (Oral)   Wt 247 lb (112 kg) Comment: pt is wearing a boot  SpO2 98%   BMI 36.48 kg/m  Gen:  Patient sitting on exam table, appears stated age in no acute distress.  Non-toxic, though looks ill and like she doesn't feel well.  Wearing a surgical mask.  Head: Normocephalic atraumatic Eyes: EOMI, PERRL, sclera and conjunctiva non-erythematous Ears:  Canals clear bilaterally.  TMs pearly gray bilaterally without erythema or bulging.   Nose:  Nasal turbinates grossly enlarged bilaterally. Some exudates noted. Tender to palpation of maxillary sinus  Mouth: Mucosa membranes moist. Tonsils +2, nonenlarged, non-erythematous. Neck: No cervical lymphadenopathy noted Heart:  RRR, no murmurs auscultated. Pulm:  Clear to auscultation bilaterally.  Coughing during exam.      Results for orders placed or performed in visit on 04/16/18 (from the past 72 hour(s))  POCT glycosylated hemoglobin (Hb A1C)     Status: Abnormal   Collection Time: 04/16/18  3:00 PM  Result Value Ref Range   Hemoglobin A1C     HbA1c POC (<> result, manual entry)     HbA1c, POC (prediabetic range)     HbA1c, POC (controlled diabetic range) 11.1 (A) 0.0 - 7.0 %

## 2018-04-18 ENCOUNTER — Encounter: Payer: Self-pay | Admitting: Family Medicine

## 2018-04-18 DIAGNOSIS — J329 Chronic sinusitis, unspecified: Secondary | ICD-10-CM | POA: Insufficient documentation

## 2018-04-18 NOTE — Assessment & Plan Note (Addendum)
A1C continues to worsen. Discussion with patient about this. Not taking victoza again due to financial burdens of this medicine.  Only on Basaglar and inconsistent usage. Reiterated treatment FU in 2 weeks after well from this illness to further discuss as she was more focused on getting better today.

## 2018-04-18 NOTE — Assessment & Plan Note (Signed)
Plan to treat as sinusitis.  Also with some cough, most likely drainage from her sinuses.  Treat with doxycycline as PCN allergy.  This should cover all respiratory issues, not just sinuses FU in 1 week if no improvement, sooner if worseing.

## 2018-04-28 ENCOUNTER — Other Ambulatory Visit: Payer: Managed Care, Other (non HMO) | Admitting: Orthotics

## 2018-05-01 ENCOUNTER — Other Ambulatory Visit: Payer: Self-pay | Admitting: Family Medicine

## 2018-05-01 DIAGNOSIS — E1165 Type 2 diabetes mellitus with hyperglycemia: Secondary | ICD-10-CM

## 2018-05-06 ENCOUNTER — Other Ambulatory Visit: Payer: Self-pay | Admitting: Family Medicine

## 2018-05-07 ENCOUNTER — Ambulatory Visit: Payer: Managed Care, Other (non HMO) | Admitting: Podiatry

## 2018-06-03 ENCOUNTER — Telehealth: Payer: Managed Care, Other (non HMO) | Admitting: Physician Assistant

## 2018-06-03 DIAGNOSIS — L02419 Cutaneous abscess of limb, unspecified: Secondary | ICD-10-CM

## 2018-06-03 MED ORDER — DOXYCYCLINE HYCLATE 100 MG PO CAPS: 100.0000 mg | ORAL_CAPSULE | Freq: Two times a day (BID) | ORAL | 0 refills | Status: AC

## 2018-06-03 NOTE — Progress Notes (Signed)
E Visit for Cellulitis  We are sorry that you are not feeling well. Here is how we plan to help!  Based on what you shared with me it looks like you have cellulitis.  Cellulitis looks like areas of skin redness, swelling, and warmth; it develops as a result of bacteria entering under the skin. Little red spots and/or bleeding can be seen in skin, and tiny surface sacs containing fluid can occur. Fever can be present. Cellulitis is almost always on one side of a body, and the lower limbs are the most common site of involvement.   I have prescribed:  Doxycycline 100 mg twice a day for ten days.   HOME CARE:  Take your medications as ordered and take all of them, even if the skin irritation appears to be healing.   GET HELP RIGHT AWAY IF:  Symptoms that don't begin to go away within 48 hours. Severe redness persists or worsens If the area turns color, spreads or swells. If it blisters and opens, develops yellow-brown crust or bleeds. You develop a fever or chills. If the pain increases or becomes unbearable.  Are unable to keep fluids and food down.  MAKE SURE YOU   Understand these instructions. Will watch your condition. Will get help right away if you are not doing well or get worse.  Thank you for choosing an e-visit. Your e-visit answers were reviewed by a board certified advanced clinical practitioner to complete your personal care plan. Depending upon the condition, your plan could have included both over the counter or prescription medications. Please review your pharmacy choice. Make sure the pharmacy is open so you can pick up prescription now. If there is a problem, you may contact your provider through [employer] and have the prescription routed to another pharmacy. Your safety is important to [place]. If you have drug allergies check your prescription carefully.  For the next 24 hours you can use MyChart to ask questions about today's visit, request a non-urgent call back,  or ask for a work or school excuse. You will get an email in the next two days asking about your experience. I hope that your e-visit has been valuable and will speed your recovery.  ===View-only below this line===   ----- Message -----    From: Charlett Nose    Sent: 06/03/2018  9:58 AM EDT      To: E-Visit Mailing List Subject: E-Visit Submission: Acne  E-Visit Submission: Acne --------------------------------  Question: What is your age? Answer:   30-50  Question: When did you first begin to notice acne? Answer:   Under age 84  Question: Do you have a family history of acne? Answer:   No  Question: Are you female or female? Answer:   Female  Question: Are you pregnant or trying to become pregnant? Answer:   No  Question: Are you breastfeeding? Answer:   No  Question: Are you taking oral contraceptives? Answer:   No  Question: What areas of your body are effected by acne? Answer:   Other (please specify)  Question: Other/Comments Answer:   I have a boil under my left arm. It it very painful. It is the size of  a 50 cent piece. My daughter who is a nurse told me to get it checked out.  Question: What treatments have you used (check all that apply)? Answer:   Over the counter acne washes  Question: If you answered yes to any prescribed medications please list them  below Answer:     Question: What is the severity of your acne? Answer:   Larger deep and firm pustules that have left scars  Question: Have you experienced any of these symptoms related to your acne Elta [place] all that apply)? Answer:   Fever or chills  Question: Are you allergic to erythromycin? Answer:   No  Question: Are you allergic to sulfa drugs? Answer:   No  Question: Are you able to attach up to three photos of your acne for your provider to review? Answer:   Yes  Question: Please list your medication allergies that you may have ? (If 'none' , please list as 'none') Answer:    Penicillin  Question: Please list any additional comments  Answer:     A total of 5-10 minutes was spent evaluating this patients questionnaire and formulating a plan of care.

## 2018-06-11 ENCOUNTER — Other Ambulatory Visit: Payer: Self-pay | Admitting: Family Medicine

## 2018-06-27 ENCOUNTER — Other Ambulatory Visit: Payer: Self-pay | Admitting: Family Medicine

## 2018-07-10 ENCOUNTER — Other Ambulatory Visit: Payer: Self-pay | Admitting: Family Medicine

## 2018-07-11 ENCOUNTER — Other Ambulatory Visit: Payer: Self-pay | Admitting: *Deleted

## 2018-07-21 MED ORDER — GABAPENTIN 100 MG PO CAPS: 100.0000 mg | ORAL_CAPSULE | Freq: Every day | ORAL | 0 refills | Status: DC

## 2018-07-21 MED ORDER — SPIRONOLACTONE 25 MG PO TABS: 25.0000 mg | ORAL_TABLET | Freq: Every day | ORAL | 3 refills | Status: DC

## 2018-07-31 ENCOUNTER — Telehealth: Payer: Self-pay

## 2018-07-31 NOTE — Telephone Encounter (Signed)
Patient called nurse line stating she has been out of work since March 11th. 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 Patient stated she has filed for unemployment, however they are requesting a doctors note stating she should not work during [employer]. Please advise. She will pick up letter when ready.

## 2018-08-04 ENCOUNTER — Encounter: Payer: Self-pay | Admitting: Family Medicine

## 2018-08-04 NOTE — Telephone Encounter (Signed)
Unfortunately, Ms Alicia West has just been assigned to me and I have not yet meet her. Please schedule either a in-person visit or telephone visit so I may assess her situation before writing a letter of my opinion.

## 2018-08-05 NOTE — Telephone Encounter (Signed)
Spoke with pt yesterday 08/04/2018. She asked since she was self pay would she be charged for a telephone visit. I informed her that it would be a charge rather she came in or did a phone visit. She said that she didn't want to be  Charged since she is self pay  and hung up the phone. Salvatore Marvel, CMA

## 2018-08-05 NOTE — Telephone Encounter (Signed)
Pt states that she is unable to pay for a visit at this time and does not understand why she has to make an appt since everything is in her record.  She also states "its not my fault [name] left"  Attempted to explain to patient that legally Dr. McDiarmid cant write a letter because he has never met her.  She is unhappy with this and does not accept my explanation.   She request that the MD call "my daughter who is an Therapist, sports, she knows what is going on".   To MD.   Christen Bame, CMA

## 2018-08-06 NOTE — Telephone Encounter (Signed)
Patients daughter is calling now about below. Patients daughter, Ubaldo Glassing, is upset that her mother is being "pushed to the side, its not our fault Calhan left, and its not like she hasn't been seen in a while." Ubaldo Glassing would like this matter addressed before next week, which was when an apt was offered. I scheduled with Owens Shark, who has availability for virtual visits for tomorrow.

## 2018-08-07 ENCOUNTER — Telehealth (INDEPENDENT_AMBULATORY_CARE_PROVIDER_SITE_OTHER): Payer: Managed Care, Other (non HMO) | Admitting: Family Medicine

## 2018-08-07 ENCOUNTER — Encounter: Payer: Self-pay | Admitting: Family Medicine

## 2018-08-07 ENCOUNTER — Other Ambulatory Visit: Payer: Self-pay

## 2018-08-07 DIAGNOSIS — E1165 Type 2 diabetes mellitus with hyperglycemia: Secondary | ICD-10-CM

## 2018-08-07 DIAGNOSIS — I10 Essential (primary) hypertension: Secondary | ICD-10-CM | POA: Diagnosis not present

## 2018-08-07 NOTE — Progress Notes (Signed)
[facility] Telemedicine Visit  Patient consented to have virtual visit. Method of visit: Telephone  Encounter participants: Patient: Alicia West - located at car (stopped at bank, not driving) Provider: Martyn Malay - located at [facility] Others (if applicable): None   Chief Complaint: letter   HPI:  Alicia West is a pleasant 48 year old woman with history of type 2 diabetes, hypertension and depression presenting via telephone visit for concerns about work.  The patient reports she has not been feeling great since November.  She works 2 jobs currently.  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 She reports she is filing for unemployment and needs a letter stating she is at high risk of complications from [id number].   ROS: per HPI  Pertinent PMHx:  Hypertenion Obesity (BMI >36) Type 2 Diabetes (uncontrolled)   Exam:  Respiratory: Speaking in full sentences breathing comfortably throughout conversation  Assessment/Plan:  Type 2 diabetes, hypertension and obesity in setting of current pandemic.  The patient's medical comorbidities do place her increased risk of complications from coronavirus.  I discussed this with her.  She asks for a note that attests to the fact that she has these medical conditions that do put her at increased risk.  We will write letter and send via my chart.  Time spent during visit with patient: 9 minutes  Trying to get unemployment  April 2nd- 5 hours Work part time job-  Go to house

## 2018-08-20 NOTE — Progress Notes (Signed)
x

## 2018-09-12 ENCOUNTER — Other Ambulatory Visit: Payer: Self-pay

## 2018-09-12 DIAGNOSIS — I10 Essential (primary) hypertension: Secondary | ICD-10-CM

## 2018-09-14 ENCOUNTER — Telehealth: Payer: Managed Care, Other (non HMO) | Admitting: Family

## 2018-09-14 DIAGNOSIS — L0291 Cutaneous abscess, unspecified: Secondary | ICD-10-CM | POA: Diagnosis not present

## 2018-09-14 MED ORDER — SULFAMETHOXAZOLE-TRIMETHOPRIM 800-160 MG PO TABS: 1.0000 | ORAL_TABLET | Freq: Two times a day (BID) | ORAL | 0 refills | Status: DC

## 2018-09-14 NOTE — Progress Notes (Signed)
E Visit for Cellulitis  We are sorry that you are not feeling well. Here is how we plan to help!  Based on what you shared with me it looks like you have cellulitis.  Cellulitis looks like areas of skin redness, swelling, and warmth; it develops as a result of bacteria entering under the skin. Little red spots and/or bleeding can be seen in skin, and tiny surface sacs containing fluid can occur. Fever can be present. Cellulitis is almost always on one side of a body, and the lower limbs are the most common site of involvement.   I have prescribed:  Bactrim DS 1 tablet by mouth BID for 7 days  Approximately 5 minutes was spent documenting and reviewing patient's chart.   HOME CARE:  . Take your medications as ordered and take all of them, even if the skin irritation appears to be healing.   GET HELP RIGHT AWAY IF:  . Symptoms that don't begin to go away within 48 hours. . Severe redness persists or worsens . If the area turns color, spreads or swells. . If it blisters and opens, develops yellow-brown crust or bleeds. . You develop a fever or chills. . If the pain increases or becomes unbearable.  . Are unable to keep fluids and food down.  MAKE SURE YOU    Understand these instructions.  Will watch your condition.  Will get help right away if you are not doing well or get worse.  Thank you for choosing an e-visit. Your e-visit answers were reviewed by a board certified advanced clinical practitioner to complete your personal care plan. Depending upon the condition, your plan could have included both over the counter or prescription medications. Please review your pharmacy choice. Make sure the pharmacy is open so you can pick up prescription now. If there is a problem, you may contact your provider through [employer] and have the prescription routed to another pharmacy. Your safety is important to [place]. If you have drug allergies check your prescription carefully.  For the  next 24 hours you can use MyChart to ask questions about today's visit, request a non-urgent call back, or ask for a work or school excuse. You will get an email in the next two days asking about your experience. I hope that your e-visit has been valuable and will speed your recovery.

## 2018-09-15 MED ORDER — CARVEDILOL 25 MG PO TABS: 25.0000 mg | ORAL_TABLET | Freq: Two times a day (BID) | ORAL | 1 refills | Status: DC

## 2018-09-15 MED ORDER — METFORMIN HCL 500 MG PO TABS: 500.0000 mg | ORAL_TABLET | Freq: Two times a day (BID) | ORAL | 0 refills | Status: DC

## 2018-09-30 MED ORDER — METFORMIN HCL 500 MG PO TABS: ORAL_TABLET | ORAL | 0 refills | Status: DC

## 2018-09-30 NOTE — Addendum Note (Signed)
Addended by: Christen Bame D on: 09/30/2018 11:44 AM   Modules accepted: Orders

## 2018-09-30 NOTE — Telephone Encounter (Signed)
Pt calls to check status of refill.  Looks like script was printed instead of sent electronically.  Resent and LMOVM for pt apologizing for delay and advised that I resent the script.  Asked that she please make appt before next refill needed. Christen Bame, CMA

## 2018-10-16 ENCOUNTER — Other Ambulatory Visit: Payer: Self-pay

## 2018-10-16 ENCOUNTER — Encounter: Payer: Self-pay | Admitting: Family Medicine

## 2018-10-16 ENCOUNTER — Ambulatory Visit (INDEPENDENT_AMBULATORY_CARE_PROVIDER_SITE_OTHER): Payer: Managed Care, Other (non HMO) | Admitting: Family Medicine

## 2018-10-16 VITALS — BP 136/84 | HR 105 | Ht 69.0 in | Wt 243.0 lb

## 2018-10-16 DIAGNOSIS — N183 Chronic kidney disease, stage 3 unspecified: Secondary | ICD-10-CM

## 2018-10-16 DIAGNOSIS — Z79899 Other long term (current) drug therapy: Secondary | ICD-10-CM | POA: Diagnosis not present

## 2018-10-16 DIAGNOSIS — E1165 Type 2 diabetes mellitus with hyperglycemia: Secondary | ICD-10-CM | POA: Diagnosis not present

## 2018-10-16 DIAGNOSIS — E1169 Type 2 diabetes mellitus with other specified complication: Secondary | ICD-10-CM

## 2018-10-16 DIAGNOSIS — L739 Follicular disorder, unspecified: Secondary | ICD-10-CM

## 2018-10-16 DIAGNOSIS — L810 Postinflammatory hyperpigmentation: Secondary | ICD-10-CM

## 2018-10-16 DIAGNOSIS — E1122 Type 2 diabetes mellitus with diabetic chronic kidney disease: Secondary | ICD-10-CM

## 2018-10-16 DIAGNOSIS — E785 Hyperlipidemia, unspecified: Secondary | ICD-10-CM

## 2018-10-16 DIAGNOSIS — Z1211 Encounter for screening for malignant neoplasm of colon: Secondary | ICD-10-CM

## 2018-10-16 DIAGNOSIS — I10 Essential (primary) hypertension: Secondary | ICD-10-CM

## 2018-10-16 DIAGNOSIS — L281 Prurigo nodularis: Secondary | ICD-10-CM

## 2018-10-16 DIAGNOSIS — Z1239 Encounter for other screening for malignant neoplasm of breast: Secondary | ICD-10-CM

## 2018-10-16 DIAGNOSIS — R3911 Hesitancy of micturition: Secondary | ICD-10-CM

## 2018-10-16 DIAGNOSIS — N3946 Mixed incontinence: Secondary | ICD-10-CM

## 2018-10-16 DIAGNOSIS — Z0000 Encounter for general adult medical examination without abnormal findings: Secondary | ICD-10-CM

## 2018-10-16 DIAGNOSIS — E7800 Pure hypercholesterolemia, unspecified: Secondary | ICD-10-CM

## 2018-10-16 DIAGNOSIS — E1149 Type 2 diabetes mellitus with other diabetic neurological complication: Secondary | ICD-10-CM

## 2018-10-16 DIAGNOSIS — E1159 Type 2 diabetes mellitus with other circulatory complications: Secondary | ICD-10-CM

## 2018-10-16 DIAGNOSIS — I152 Hypertension secondary to endocrine disorders: Secondary | ICD-10-CM

## 2018-10-16 DIAGNOSIS — H3530 Unspecified macular degeneration: Secondary | ICD-10-CM

## 2018-10-16 LAB — POCT GLYCOSYLATED HEMOGLOBIN (HGB A1C): HbA1c, POC (controlled diabetic range): 8.8 % — AB (ref 0.0–7.0)

## 2018-10-16 NOTE — Patient Instructions (Signed)
It was good to meet you Alicia West  We will make referrals for a colonoscopy, and for a visit to a urologist to discuss the problems with your bladder.  We make a referral to [facility] for your mammogram to screen for breat cancer.  We will disuss your cervical cancer screening (Pap smear) at the next office visit.  We will discuss the the new diabetes medicaine that lets you pee out sugar.  Empagliflozin.   Dr McDiarmid will look into prescription bleaching creams.  We are checking your lipids and kidney function and electrolytes today. Dr McDiarmid will call you if your tests are not good. Otherwise he will send you a letter.  If you sign up for MyChart online, you will be able to see your test results once Dr McDiarmid has reviewed them.  If you do not hear from [place] with in 2 weeks please call our office

## 2018-10-17 ENCOUNTER — Encounter: Payer: Self-pay | Admitting: Family Medicine

## 2018-10-17 DIAGNOSIS — N183 Chronic kidney disease, stage 3 unspecified: Secondary | ICD-10-CM | POA: Insufficient documentation

## 2018-10-17 DIAGNOSIS — N3946 Mixed incontinence: Secondary | ICD-10-CM

## 2018-10-17 DIAGNOSIS — L739 Follicular disorder, unspecified: Secondary | ICD-10-CM | POA: Insufficient documentation

## 2018-10-17 DIAGNOSIS — L810 Postinflammatory hyperpigmentation: Secondary | ICD-10-CM | POA: Insufficient documentation

## 2018-10-17 DIAGNOSIS — E1140 Type 2 diabetes mellitus with diabetic neuropathy, unspecified: Secondary | ICD-10-CM

## 2018-10-17 DIAGNOSIS — Z0000 Encounter for general adult medical examination without abnormal findings: Secondary | ICD-10-CM | POA: Insufficient documentation

## 2018-10-17 DIAGNOSIS — E1122 Type 2 diabetes mellitus with diabetic chronic kidney disease: Secondary | ICD-10-CM

## 2018-10-17 DIAGNOSIS — L281 Prurigo nodularis: Secondary | ICD-10-CM | POA: Insufficient documentation

## 2018-10-17 DIAGNOSIS — L819 Disorder of pigmentation, unspecified: Secondary | ICD-10-CM | POA: Insufficient documentation

## 2018-10-17 HISTORY — DX: Follicular disorder, unspecified: L73.9

## 2018-10-17 HISTORY — DX: Prurigo nodularis: L28.1

## 2018-10-17 HISTORY — DX: Mixed incontinence: N39.46

## 2018-10-17 HISTORY — DX: Chronic kidney disease, stage 3 unspecified: E11.22

## 2018-10-17 HISTORY — DX: Type 2 diabetes mellitus with diabetic neuropathy, unspecified: E11.40

## 2018-10-17 LAB — BASIC METABOLIC PANEL
BUN/Creatinine Ratio: 13 (ref 9–23)
BUN: 16 mg/dL (ref 6–24)
CO2: 24 mmol/L (ref 20–29)
Calcium: 10.6 mg/dL — ABNORMAL HIGH (ref 8.7–10.2)
Chloride: 103 mmol/L (ref 96–106)
Creatinine, Ser: 1.27 mg/dL — ABNORMAL HIGH (ref 0.57–1.00)
GFR calc Af Amer: 58 mL/min/{1.73_m2} — ABNORMAL LOW (ref >59)
GFR calc non Af Amer: 50 mL/min/{1.73_m2} — ABNORMAL LOW (ref >59)
Glucose: 124 mg/dL — ABNORMAL HIGH (ref 65–99)
Potassium: 4.5 mmol/L (ref 3.5–5.2)
Sodium: 139 mmol/L (ref 134–144)

## 2018-10-17 LAB — LIPID PANEL
Chol/HDL Ratio: 5.7 ratio — ABNORMAL HIGH (ref 0.0–4.4)
Cholesterol, Total: 211 mg/dL — ABNORMAL HIGH (ref 100–199)
HDL: 37 mg/dL — ABNORMAL LOW (ref >39)
LDL Chol Calc (NIH): 139 mg/dL — ABNORMAL HIGH (ref 0–99)
Triglycerides: 193 mg/dL — ABNORMAL HIGH (ref 0–149)
VLDL Cholesterol Cal: 35 mg/dL (ref 5–40)

## 2018-10-17 MED ORDER — VICTOZA 18 MG/3ML ~~LOC~~ SOPN: 1.8000 mg | PEN_INJECTOR | Freq: Every day | SUBCUTANEOUS | 3 refills | Status: DC

## 2018-10-17 MED ORDER — GABAPENTIN 100 MG PO CAPS: 100.0000 mg | ORAL_CAPSULE | Freq: Every day | ORAL | 0 refills | Status: DC

## 2018-10-17 MED ORDER — BASAGLAR KWIKPEN 100 UNIT/ML ~~LOC~~ SOPN: 30.0000 [IU] | PEN_INJECTOR | Freq: Every day | SUBCUTANEOUS | 1 refills | Status: DC

## 2018-10-17 MED ORDER — SPIRONOLACTONE 25 MG PO TABS: 25.0000 mg | ORAL_TABLET | Freq: Every day | ORAL | 3 refills | Status: DC

## 2018-10-17 MED ORDER — METFORMIN HCL 500 MG PO TABS: ORAL_TABLET | ORAL | 3 refills | Status: DC

## 2018-10-17 MED ORDER — GABAPENTIN 100 MG PO CAPS: 100.0000 mg | ORAL_CAPSULE | Freq: Every day | ORAL | 3 refills | Status: DC

## 2018-10-17 MED ORDER — CARVEDILOL 25 MG PO TABS: 25.0000 mg | ORAL_TABLET | Freq: Two times a day (BID) | ORAL | 3 refills | Status: DC

## 2018-10-17 MED ORDER — LOSARTAN POTASSIUM-HCTZ 100-25 MG PO TABS: 1.0000 | ORAL_TABLET | Freq: Every day | ORAL | 3 refills | Status: DC

## 2018-10-17 MED ORDER — HYDROQUINONE 4 % EX CREA: TOPICAL_CREAM | Freq: Two times a day (BID) | CUTANEOUS | 0 refills | Status: DC

## 2018-10-17 NOTE — Assessment & Plan Note (Signed)
New problem Both stress and urge incontinence along with sense of incomplete voiding Having to wear pads. Urine odor is embarrassing. Referral to Urology for consideration of urodynamics for possibility of diabetic cystopathy.

## 2018-10-17 NOTE — Assessment & Plan Note (Addendum)
Established problem Uncontrolled . Lipid Panel     Component Value Date/Time   CHOL 211 (H) 10/16/2018 1549   TRIG 193 (H) 10/16/2018 1549   HDL 37 (L) 10/16/2018 1549   CHOLHDL 5.7 (H) 10/16/2018 1549   CHOLHDL 3.6 03/17/2013 1109   VLDL 28 03/17/2013 1109   LDLCALC 118 (H) 06/20/2017 0923   LDLDIRECT 85 03/09/2014 1115   Patient willing to consider taking a dose of atorvastatin or rosuvastatin once a week.  Referral to [facility] who has seen pt before for diabetic eye exam and question of macular degeneration.

## 2018-10-17 NOTE — Assessment & Plan Note (Addendum)
New diagnosis. Lab Results  Component Value Date   CREATININE 1.27 (H) 10/16/2018   BUN 16 10/16/2018   NA 139 10/16/2018   K 4.5 10/16/2018   CL 103 10/16/2018   CO2 24 10/16/2018

## 2018-10-17 NOTE — Assessment & Plan Note (Addendum)
Lab Results  Component Value Date   HGBA1C 8.8 (A) 10/16/2018   Established problem that has improved.  Prior A1c 11.1 04/2018. Unable to tolerate above 1.5 mg Victoza daily Continue Victoza 1.5 mg Crane daily Continue Basaglar 30 units daily. Discuss canagliflozin for renal protection in CKD 3 and improved glycemic control at next ov

## 2018-10-17 NOTE — Assessment & Plan Note (Signed)
Established problem. Stable. Referral back to [facility] (Dr Zadie Rhine) Pt previously on intra-ocular injections

## 2018-10-17 NOTE — Assessment & Plan Note (Addendum)
New problem Multiple sites of hyperpigmentation papular to nodular torso and face. Likely starts as a folliculitis then patient develops habit of repeatative scratching of site Pt with habit scrating of skin sites.  Hyperpigmentation is most distressing part of the lesions topt. See hyperpigmentation of skin problem.

## 2018-10-17 NOTE — Assessment & Plan Note (Signed)
New diagnosis BMI > 35% plus Hypertension and DMT2

## 2018-10-17 NOTE — Assessment & Plan Note (Signed)
Established problem worsened.  Secondary to prurigo dermatitis Recommend topical hydroquinone.  Rx sent in.

## 2018-10-17 NOTE — Progress Notes (Signed)
Subjective:    Patient ID: Alicia West, female    DOB: [birth year], 48 y.o.   MRN: [id number] Alicia West is alone Sources of clinical information for visit is/are patient and past medical records. Nursing assessment for this office visit was reviewed with the patient for accuracy and revision.   Previous Report(s) Reviewed: historical medical records  Depression screen [facility] 2/9 10/16/2018  Decreased Interest 0  Down, Depressed, Hopeless 0  PHQ - 2 Score 0  Altered sleeping -  Tired, decreased energy -  Change in appetite -  Feeling bad or failure about yourself  -  Trouble concentrating -  Moving slowly or fidgety/restless -  Suicidal thoughts -  PHQ-9 Score -   Fall Risk  04/16/2018 01/31/2018 01/07/2018 09/30/2017 09/30/2017  Falls in the past year? 0 0 0 No No  Number falls in past yr: 0 - 0 - -  Injury with Fall? 0 - 0 - -  Follow up Falls evaluation completed - - - -   Adult vaccines due  Topic Date Due  . TETANUS/TDAP  05/29/2021  . PNEUMOCOCCAL POLYSACCHARIDE VACCINE AGE 66-64 HIGH RISK  Completed   Health Maintenance Due  Topic Date Due  . PAP SMEAR-Modifier  05/26/2016  . OPHTHALMOLOGY EXAM  09/26/2017    History/P.E. limitations: none  Adult vaccines due  Topic Date Due  . TETANUS/TDAP  05/29/2021  . PNEUMOCOCCAL POLYSACCHARIDE VACCINE AGE 66-64 HIGH RISK  Completed    Diabetes Health Maintenance Due  Topic Date Due  . OPHTHALMOLOGY EXAM  09/26/2017  . FOOT EXAM  01/08/2019  . HEMOGLOBIN A1C  04/15/2019    Health Maintenance Due  Topic Date Due  . PAP SMEAR-Modifier  05/26/2016  . OPHTHALMOLOGY EXAM  09/26/2017     Chief Complaint  Patient presents with  . Diabetes      HPI  HYPERTENSION Disease Monitoring: Blood pressure range-not checking at home  Chest pain no palpitations- no       Dyspnea- no Medications: Compliance- yes  Lightheadedness no Syncope- no    Edema- no  DIABETES Disease Monitoring: Blood Sugar ranges-not  checking at home  Polyuria/phagia/dipsia- no       Visual problems- hx of macular degeneration treated with intraocular injections bilaterally  Medications: Compliance- No taking basaglar 30 units daily rather than prescribed 40 units daily  Hypoglycemic symptoms- no  HYPERLIPIDEMIA Disease Monitoring: See symptoms for Hypertension Medications: Compliance- no medications. Statins have cause myalgias in past - atoravastin     Bump on back Location: under right shoulder blade Onset: 1-2 weeks ago  Course: growing Self-treated with: nothing            History Pruritis: no  Tenderness: yes, after squeezing it  New medications/antibiotics: no  Tick/insect/pet exposure: no  Recent travel: no  New detergent, new clothing, or other topical exposure: no  Hx of recent sebaceous cyst that patient self-expressed core (shown photos)  Red Flags Feeling ill: no  Fever: no  Mouth lesions: no  Facial/tongue swelling/difficulty breathing:  no  Diabetic or immunocompromised: no   Macular Degeneration, Hx - Onset several years ago - Pt reports that treated with intraocular injections in both eyes in past. - No significant change in vision recently.     Urinary Incontinence Onset: several years ago Frequency: no  Nocturia: no Volume loss: varies from small squirts and large volume voids Peripheral edema: no  Urine loss with motion(cough/laugh/sneeze/lift/stand): yes  Strong urge to void and cannot get  to toilet fast enough: yes Most bothersome consequence of incontinence: very embarrassing, t   PMH: Diabetes (+)  Prior testing for incontinence: Referral to GYN for possible bladder prolapse. Pt reports the GYN did not believe patient had prolapse.  Prior therapies for incontinence: none  PSH: Prior Surgeries: none SH: Access to toilet: yes  OB History: [id number]  ROS: Hematuria: no Pelvic pain: no Fecal incontinence: no Late evening fluids: no Alcohol: no Caffeine: no Leg  swelling: no Pain awakening from sleep: no Dysuria: no Hesitancy: Yes Slow stream: yes Incomplete emptying: yes  Pigmented skin bumps - Multiple dark bumps that have accumulated over several years - Occur at site where patient starts habit of picking at a lesion.  - pt treated with OTC bleaching formulation that became too expensive - Pt feels the sites are unsightly.   Monitoring Labs and Parameters Last A1C:  Lab Results  Component Value Date   HGBA1C 8.8 (A) 10/16/2018    Last Lipid:     Component Value Date/Time   CHOL 211 (H) 10/16/2018 1549   HDL 37 (L) 10/16/2018 1549   LDLDIRECT 85 03/09/2014 1115    Last Bmet  Potassium  Date Value Ref Range Status  10/16/2018 4.5 3.5 - 5.2 mmol/L Final   Sodium  Date Value Ref Range Status  10/16/2018 139 134 - 144 mmol/L Final   Creat  Date Value Ref Range Status  01/25/2016 0.99 0.50 - 1.10 mg/dL Final   Creatinine, Ser  Date Value Ref Range Status  10/16/2018 1.27 (H) 0.57 - 1.00 mg/dL Final     Estimated Creatinine Clearance: 71.7 mL/min (A) (by C-G formula based on SCr of 1.27 mg/dL (H)).  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 Malar firm papules with dark pigmentation covered with foundation makeup.   COR: Heart sounds are normal.  Regular rate and rhythm without murmur, gallop or rub. LUNG: clear to auscultation bilaterally ABD: normal appearance, normal bowel sounds, nontender to palpation,, no palpable masses, No HSM EXT: no edema; no deformities, gait was normal for age NEURO: alert, oriented, normal speech, no focal findings or movement disorder  noted PSYCH: normal speech/language    Assessment & Plan:

## 2018-10-17 NOTE — Assessment & Plan Note (Signed)
Pt referred for screening mammogram; screening colonoscopy;

## 2018-10-17 NOTE — Assessment & Plan Note (Signed)
New problem Right lateral thorax 4-5  mm folliculitis papule Topical heat to bring to point. If enlarges without spontaneous drainage then RTC

## 2018-10-17 NOTE — Assessment & Plan Note (Signed)
Adequate blood pressure control.  No evidence of new end organ damage.  Tolerating medication without significant adverse effects.  Plan to continue current blood pressure regiment.   

## 2018-10-28 ENCOUNTER — Encounter: Payer: Self-pay | Admitting: Gynecology

## 2018-10-28 ENCOUNTER — Encounter (INDEPENDENT_AMBULATORY_CARE_PROVIDER_SITE_OTHER): Payer: Self-pay | Admitting: Ophthalmology

## 2018-11-17 ENCOUNTER — Encounter: Payer: Self-pay | Admitting: Family Medicine

## 2018-11-17 NOTE — Progress Notes (Signed)
[facility] Note  11/19/2018     CHIEF COMPLAINT Patient presents for Retina Evaluation   HISTORY OF PRESENT ILLNESS: Alicia West is a 48 y.o. female who presents to the clinic today for:   HPI    Retina Evaluation    In both eyes.  This started years ago.  Duration of years.  Associated Symptoms Floaters.  Context:  distance vision.  I, the attending physician,  performed the HPI with the patient and updated documentation appropriately.          Comments    BS: 145 last time checked A1c: 8.9 Patient states she began noticing decreasing changes in her vision more than 10 years ago.  Patient has a history with [facility]'s Dr. Gypsy Balsam patient, Dr. Oval Linsey is not with [facility] anymore.  Patient states she has a history of injections in both eyes with Dr. Oval Linsey and last injection was approximately 2-3 years ago.  Patient states she has history of laser treatment in the left eye only.  Patient states she has a long history of diabetes in her family, but reports no history in family of ocular disease or diabetic retinopathy.       Last edited by Bernarda Caffey, MD on 11/19/2018  3:59 PM. (History)    pt was referred by Dr. Valetta Close for DME, pt used to see the retina specialist at [facility], Dr. Oval Linsey, but she hasn't seen a retina dr since 2018, pt states prior to Dr. Oval Linsey she saw Dr. Starling Manns, pt states she received injections of Avastin in both eyes for several years, pt wears contact lenses and states Dr. Valetta Close gave her a new glasses Rx, but she hasn't gotten it filled yet, pt is a type 2 diabetic on insulin, she states she also takes medication for her blood pressure, she states she feels like her diabetic control is getting better bc she is on victoza, but the medication causes her to not want to eat or drink  Referring physician: Jola Schmidt, MD [place],  [place] [id number]  HISTORICAL  INFORMATION:   Selected notes from the MEDICAL RECORD NUMBER Patient referred by Dr. Jola Schmidt for diabetic retinal eval  LEE: 09.17.20, BCVA 20/25 OU PMH: DM, last a1c was 8.8 on 09.10.20, HTN   CURRENT MEDICATIONS: No current outpatient medications on file. (Ophthalmic Drugs)   No current facility-administered medications for this visit.  (Ophthalmic Drugs)   Current Outpatient Medications (Other)  Medication Sig  . B-D ULTRAFINE III SHORT PEN 31G X 8 MM MISC USE AS DIRECTED UP TO 4 TIMES DAILY  . carvedilol (COREG) 25 MG tablet Take 1 tablet (25 mg total) by mouth 2 (two) times daily with a meal.  . gabapentin (NEURONTIN) 100 MG capsule Take 1 capsule (100 mg total) by mouth at bedtime.  Marland Kitchen glucose blood (BAYER CONTOUR NEXT TEST) test strip Use to check blood sugar three times a day or as directed.  . hydroquinone 4 % cream Apply topically 2 (two) times daily.  . Insulin Glargine (BASAGLAR KWIKPEN) 100 UNIT/ML SOPN Inject 0.3 mLs (30 Units total) into the skin daily. Dispense QS x 1 month  . Lancets MISC Use Contour Next Lancet to check blood glucose 3 times a day.  Dx code E11.65.  . liraglutide (VICTOZA) 18 MG/3ML SOPN Inject 0.3 mLs (1.8 mg total) into the skin daily.  Marland Kitchen losartan-hydrochlorothiazide (HYZAAR) 100-25 MG tablet Take 1 tablet by mouth daily.  Marland Kitchen  metFORMIN (GLUCOPHAGE) 500 MG tablet Take one tablet by mouth 2 times daily with a meal. Patient needs in-person or telephone app't with Dr McDiarmid before any further refills  . NOVOTWIST 32G X 5 MM MISC USE ALONG WITH INSULIN INJECTIONS UP TO 4 X DAILY  . spironolactone (ALDACTONE) 25 MG tablet Take 1 tablet (25 mg total) by mouth daily.  . Syringe, Disposable, 1 ML MISC 1 each by Does not apply route 3 (three) times daily.   No current facility-administered medications for this visit.  (Other)      REVIEW OF SYSTEMS: ROS    Positive for: Eyes   Negative for: Constitutional, Gastrointestinal, Neurological, Skin,  Genitourinary, Musculoskeletal, HENT, Endocrine, Cardiovascular, Respiratory, Psychiatric, Allergic/Imm, Heme/Lymph   Last edited by Doneen Poisson on 11/19/2018  1:34 PM. (History)       ALLERGIES Allergies  Allergen Reactions  . Ace Inhibitors Cough  . Amlodipine Swelling    edema  . Atorvastatin Other (See Comments)    Muscle aches in thighs  . Penicillins Other (See Comments)    Unknown childhood reaction - able to tolerate cephalosporins    PAST MEDICAL HISTORY Past Medical History:  Diagnosis Date  . ANEMIA, IRON DEFICIENCY, UNSPEC. 04/04/2006   Qualifier: Diagnosis of  By: Samara Snide    . Binge-eating disorder, moderate 03/17/2013  . Breast lump 07/22/2009   Qualifier: Diagnosis of  By: Netty Starring  MD, Lucianne Muss    . CKD stage 3 secondary to diabetes ([place]) 10/17/2018  . DEPRESSIVE DISORDER, NOS 04/04/2006   Qualifier: Diagnosis of  By: Samara Snide    . Diabetes mellitus   . Diabetic neuropathy ([place]) 10/17/2018  . Herpes labialis 09/06/2010  . Herpes simplex labialis   . Hypertension   . Menorrhagia 04/04/2006   Qualifier: Diagnosis of  By: Samara Snide    . Morbid obesity ([place]) 05/01/2013  . Oral cancer ([place]) 10/01/2013   As of 09/29/13: Pt reported historical dx of dental cancer by her dentist back in Nov 2014, has not regularly followed up for treatments. Hx is vague, unclear what the actual diagnosis is.   . Paroxysmal supraventricular tachycardia ([place]) 10/26/2014  . POSITIVE PPD 10/18/2009   Qualifier: Diagnosis of  By: Zebedee Iba NP, Manuela Schwartz    . RHINITIS, ALLERGIC 04/04/2006   Qualifier: Diagnosis of  By: Samara Snide    . TB (tuberculosis) contact    + skin test,/- chest x-ray - in mid 2000's   Past Surgical History:  Procedure Laterality Date  . BREAST SURGERY     Reduction  . TONSILLECTOMY      FAMILY HISTORY Family History  Problem Relation Age of Onset  . Hypertension Mother   . Diabetes Mellitus II Mother   . Congestive Heart Failure Mother        Deceased from  complications of CHF  . Hypertension Father   . Diabetes Mellitus II Father   . Diabetes Mellitus II Brother     SOCIAL HISTORY Social History   Tobacco Use  . Smoking status: Never Smoker  . Smokeless tobacco: Never Used  Substance Use Topics  . Alcohol use: Yes    Comment: Occas  . Drug use: No         OPHTHALMIC EXAM:  Base Eye Exam    Visual Acuity (Snellen - Linear)      Right Left   Dist [place] 20/150 20/80   Dist ph [place] 20/40 +1 20/50       Tonometry (Tonopen, 1:46  PM)      Right Left   Pressure 16 14       Pupils      Dark Light Shape React APD   Right 3 2 Round Minimal 0   Left 3 2 Round Minimal 0       Visual Fields      Left Right    Full Full       Extraocular Movement      Right Left    Full, Ortho Full, Ortho       Neuro/Psych    Oriented x3: Yes   Mood/Affect: Normal       Dilation    Both eyes: 1.0% Mydriacyl, 2.5% Phenylephrine @ 1:46 PM        Slit Lamp and Fundus Exam    Slit Lamp Exam      Right Left   Lids/Lashes Normal Normal   Conjunctiva/Sclera Mild Melanosis Mild Melanosis   Cornea Clear Clear   Anterior Chamber Deep and quiet Deep and quiet   Iris Round and dilated Round and dilated   Lens 1-2+ Nuclear sclerosis, 2+ Cortical cataract, trace Posterior subcapsular cataract 2+ Nuclear sclerosis, 1-2+ Cortical cataract, trace PSC   Vitreous Mild Vitreous syneresis Mild Vitreous syneresis       Fundus Exam      Right Left   Disc Pink and Sharp, +cupping Pink and Sharp, +cupping   C/D Ratio 0.6 0.6   Macula Good foveal reflex, scattered MA, clusters of MA and exudate non-central macula blunted foveal reflex, central IRH and punctate exudate, central cystic changes, scattered IRH and exudate greatest centrally   Vessels Vascular attenuation, Tortuous, AV crossing changes Vascular attenuation, Tortuous, AV crossing changes   Periphery Attached, scattered MA/IRH, punctate focal CWSs posteriorly Attached, scattered MA/IRH,  punctate focal CWSs posteriorly        Refraction    Wearing Rx   Did not bring glasses       Manifest Refraction      Sphere Cylinder Dist VA   Right -2.75 Sphere 20/25   Left -2.25 Sphere 20/25-2          IMAGING AND PROCEDURES  Imaging and Procedures for @TODAY @  OCT, Retina - OU - Both Eyes       Right Eye Quality was good. Central Foveal Thickness: 225. Progression has no prior data. Findings include normal foveal contour, intraretinal fluid, intraretinal hyper-reflective material, no SRF.   Left Eye Quality was good. Central Foveal Thickness: 354. Progression has no prior data. Findings include abnormal foveal contour, intraretinal fluid, intraretinal hyper-reflective material, no SRF, vitreomacular adhesion .   Notes *Images captured and stored on drive  Diagnosis / Impression:  OD: NFP, non-central IRF/DME, no SRF OS: abnormal foveal profile with +central IRF/DME, no SRF  Clinical management:  See below  Abbreviations: NFP - Normal foveal profile. CME - cystoid macular edema. PED - pigment epithelial detachment. IRF - intraretinal fluid. SRF - subretinal fluid. EZ - ellipsoid zone. ERM - epiretinal membrane. ORA - outer retinal atrophy. ORT - outer retinal tubulation. SRHM - subretinal hyper-reflective material                 ASSESSMENT/PLAN:    ICD-10-CM   1. Severe nonproliferative diabetic retinopathy of both eyes with macular edema associated with type 2 diabetes mellitus ([place])  C16.6063   2. Retinal edema  H35.81 OCT, Retina - OU - Both Eyes  3. Essential hypertension  I10   4. Hypertensive retinopathy  of both eyes  H35.033   5. Combined forms of age-related cataract of both eyes  H25.813     1,2. Severe Non-proliferative diabetic retinopathy, OU  - The incidence, risk factors for progression, natural history and treatment options for diabetic retinopathy  were discussed with patient.    - The need for close monitoring of blood glucose,  blood pressure, and serum lipids, avoiding cigarette or any type of tobacco, and the need for long term follow up was also discussed with patient.  - previously followed at [facility] with Drs. Oval Linsey and [place], but has not been seen since August of 2018 -- s/p multiple IVA OU  - exam shows scattered IRH and exudates OU  - OCT shows non-central IRF/DME OD, abnormal foveal profile with central IRF/DME OS  - The natural history, pathology, and characteristics of diabetic macular edema discussed with patient.  A generalized discussion of the major clinical trials concerning treatment of diabetic macular edema (ETDRS, DCT, SCORE, RISE / RIDE, and ongoing DRCR net studies) was completed.  This discussion included mention of the various approaches to treating diabetic macular edema (observation, laser photocoagulation, anti-VEGF injections with lucentis / Avastin / Eylea, steroid injections with Kenalog / Ozurdex, and intraocular surgery with vitrectomy).  The goal hemoglobin A1C of 6-7 was discussed, as well as importance of smoking cessation and hypertension control.  Need for ongoing treatment and monitoring were specifically discussed with reference to chronic nature of diabetic macular edema.  - recommend IVA #1 OU today, 10.14.2020  - pt unable to undergo procedure today and wishes to defer treatment until November  - RBA of procedure discussed, questions answered  - f/u in 4 wks, OCT, DFE, FA (optos, transit OS), possible injections  3,4. Hypertensive retinopathy OU  - discussed importance of tight BP control  - monitor  5. Mixed form age related cataract OU -- mild  - under the expert management of Dr. Valetta Close  - The symptoms of cataract, surgical options, and treatments and risks were discussed with patient.  - discussed diagnosis and progression and association of PSC with DM   Ophthalmic Meds Ordered this visit:  No orders of the defined types were placed in this  encounter.      Return in about 4 weeks (around 12/17/2018) for f/u NPDR OU, DFE, OCT.  There are no Patient Instructions on file for this visit.    Explained the diagnoses, plan, and follow up with the patient and they expressed understanding.  Patient expressed understanding of the importance of proper follow up care.   This document serves as a record of services personally performed by Gardiner Sleeper, MD, PhD. It was created on their behalf by Roselee Nova, COMT. The creation of this record is the provider's dictation and/or activities during the visit.  Electronically signed by: Roselee Nova, COMT 11/19/18 10:56 PM   Gardiner Sleeper, M.D., Ph.D. Diseases & Surgery of the Retina and Vitreous [facility]  I have reviewed the above documentation for accuracy and completeness, and I agree with the above. Gardiner Sleeper, M.D., Ph.D. 11/19/18 11:01 PM     Abbreviations: M myopia (nearsighted); A astigmatism; H hyperopia (farsighted); P presbyopia; Mrx spectacle prescription;  CTL contact lenses; OD right eye; OS left eye; OU both eyes  XT exotropia; ET esotropia; PEK punctate epithelial keratitis; PEE punctate epithelial erosions; DES dry eye syndrome; MGD meibomian gland dysfunction; ATs artificial tears; PFAT's preservative free artificial tears; [place] nuclear sclerotic  cataract; PSC posterior subcapsular cataract; ERM epi-retinal membrane; PVD posterior vitreous detachment; RD retinal detachment; DM diabetes mellitus; DR diabetic retinopathy; NPDR non-proliferative diabetic retinopathy; PDR proliferative diabetic retinopathy; CSME clinically significant macular edema; DME diabetic macular edema; dbh dot blot hemorrhages; CWS cotton wool spot; POAG primary open angle glaucoma; C/D cup-to-disc ratio; HVF humphrey visual field; GVF goldmann visual field; OCT optical coherence tomography; IOP intraocular pressure; BRVO Branch retinal vein occlusion; CRVO central  retinal vein occlusion; CRAO central retinal artery occlusion; BRAO branch retinal artery occlusion; RT retinal tear; SB scleral buckle; PPV pars plana vitrectomy; VH Vitreous hemorrhage; PRP panretinal laser photocoagulation; IVK intravitreal kenalog; VMT vitreomacular traction; MH Macular hole;  NVD neovascularization of the disc; NVE neovascularization elsewhere; AREDS age related eye disease study; ARMD age related macular degeneration; POAG primary open angle glaucoma; EBMD epithelial/anterior basement membrane dystrophy; ACIOL anterior chamber intraocular lens; IOL intraocular lens; PCIOL posterior chamber intraocular lens; Phaco/IOL phacoemulsification with intraocular lens placement; [place] photorefractive keratectomy; LASIK laser assisted in situ keratomileusis; HTN hypertension; DM diabetes mellitus; COPD chronic obstructive pulmonary disease

## 2018-11-19 ENCOUNTER — Ambulatory Visit (INDEPENDENT_AMBULATORY_CARE_PROVIDER_SITE_OTHER): Payer: Managed Care, Other (non HMO) | Admitting: Ophthalmology

## 2018-11-19 ENCOUNTER — Encounter (INDEPENDENT_AMBULATORY_CARE_PROVIDER_SITE_OTHER): Payer: Self-pay | Admitting: Ophthalmology

## 2018-11-19 ENCOUNTER — Encounter: Payer: Self-pay | Admitting: Gastroenterology

## 2018-11-19 DIAGNOSIS — I10 Essential (primary) hypertension: Secondary | ICD-10-CM

## 2018-11-19 DIAGNOSIS — E113413 Type 2 diabetes mellitus with severe nonproliferative diabetic retinopathy with macular edema, bilateral: Secondary | ICD-10-CM

## 2018-11-19 DIAGNOSIS — H35033 Hypertensive retinopathy, bilateral: Secondary | ICD-10-CM

## 2018-11-19 DIAGNOSIS — H25813 Combined forms of age-related cataract, bilateral: Secondary | ICD-10-CM

## 2018-11-19 DIAGNOSIS — H3581 Retinal edema: Secondary | ICD-10-CM

## 2018-11-20 ENCOUNTER — Encounter: Payer: Self-pay | Admitting: Family Medicine

## 2018-11-20 DIAGNOSIS — H259 Unspecified age-related cataract: Secondary | ICD-10-CM

## 2018-11-20 DIAGNOSIS — H35033 Hypertensive retinopathy, bilateral: Secondary | ICD-10-CM

## 2018-11-20 DIAGNOSIS — E11311 Type 2 diabetes mellitus with unspecified diabetic retinopathy with macular edema: Secondary | ICD-10-CM | POA: Insufficient documentation

## 2018-11-20 DIAGNOSIS — E113413 Type 2 diabetes mellitus with severe nonproliferative diabetic retinopathy with macular edema, bilateral: Secondary | ICD-10-CM | POA: Insufficient documentation

## 2018-11-20 DIAGNOSIS — H542X22 Low vision right eye category 2, low vision left eye category 2: Secondary | ICD-10-CM

## 2018-11-20 HISTORY — DX: Unspecified age-related cataract: H25.9

## 2018-11-20 HISTORY — DX: Low vision right eye category 2, low vision left eye category 2: H54.2X22

## 2018-11-20 HISTORY — DX: Hypertensive retinopathy, bilateral: H35.033

## 2018-11-20 HISTORY — DX: Type 2 diabetes mellitus with severe nonproliferative diabetic retinopathy with macular edema, bilateral: E11.3413

## 2018-11-20 HISTORY — DX: Type 2 diabetes mellitus with unspecified diabetic retinopathy with macular edema: E11.311

## 2018-12-08 ENCOUNTER — Ambulatory Visit
Admission: RE | Admit: 2018-12-08 | Discharge: 2018-12-08 | Disposition: A | Discharge status: Home / Self Care | Payer: Managed Care, Other (non HMO) | Source: Ambulatory Visit | Attending: Family Medicine | Admitting: Family Medicine

## 2018-12-08 ENCOUNTER — Other Ambulatory Visit: Payer: Self-pay

## 2018-12-08 DIAGNOSIS — Z1239 Encounter for other screening for malignant neoplasm of breast: Secondary | ICD-10-CM

## 2018-12-08 IMAGING — MG DIGITAL SCREENING BILAT W/ TOMO
8 series · 8 of 24 positions shown · non-contrast
Comparison: Previous exam(s).

CLINICAL DATA: Screening.

EXAM:
DIGITAL SCREENING BILATERAL MAMMOGRAM WITH TOMO AND CAD

[L MLO synth-2D]
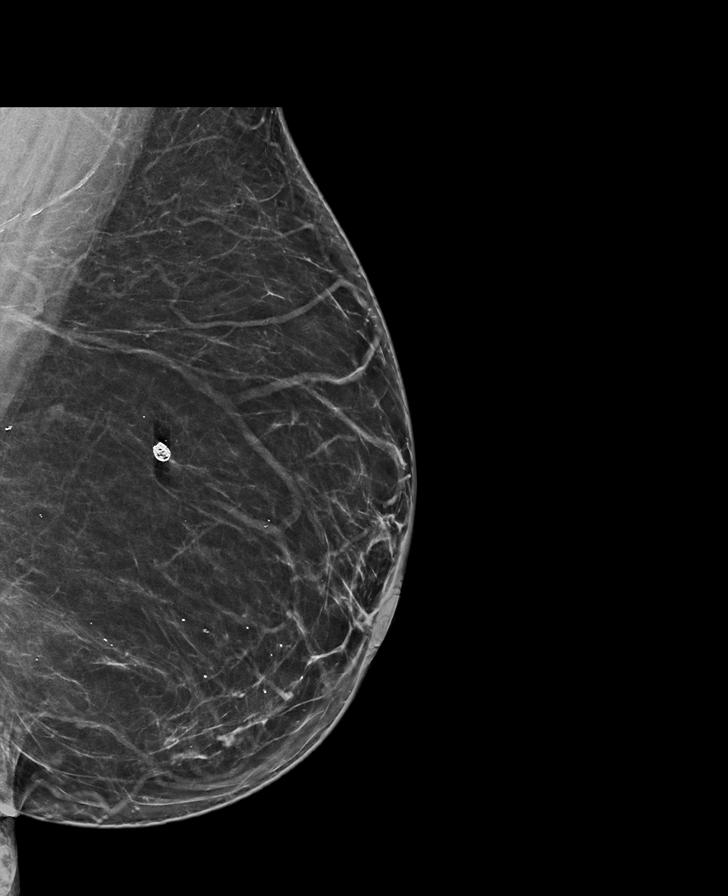

[R CC synth-2D]
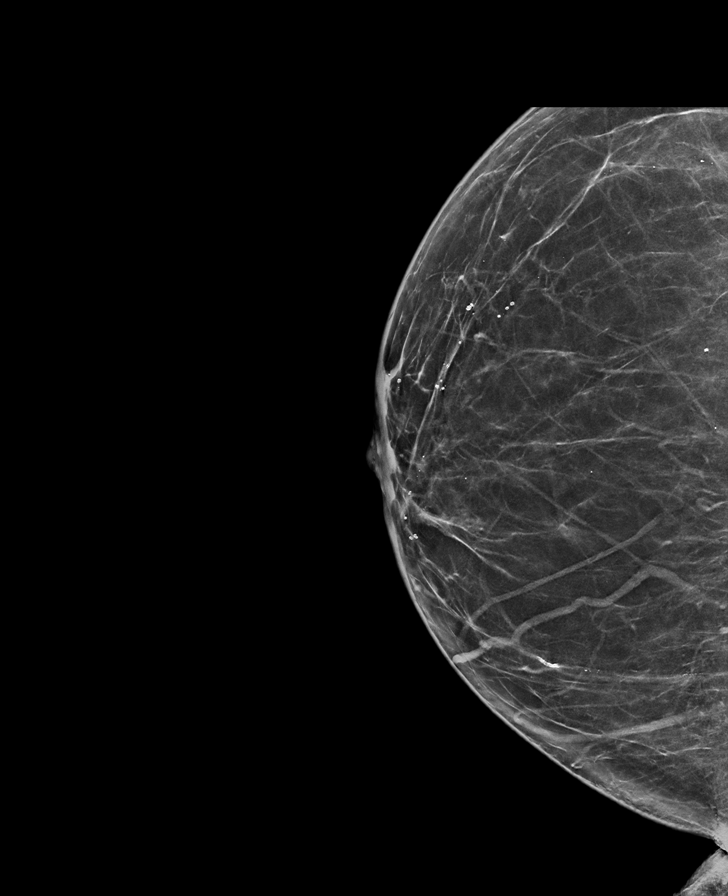

[L CC synth-2D]
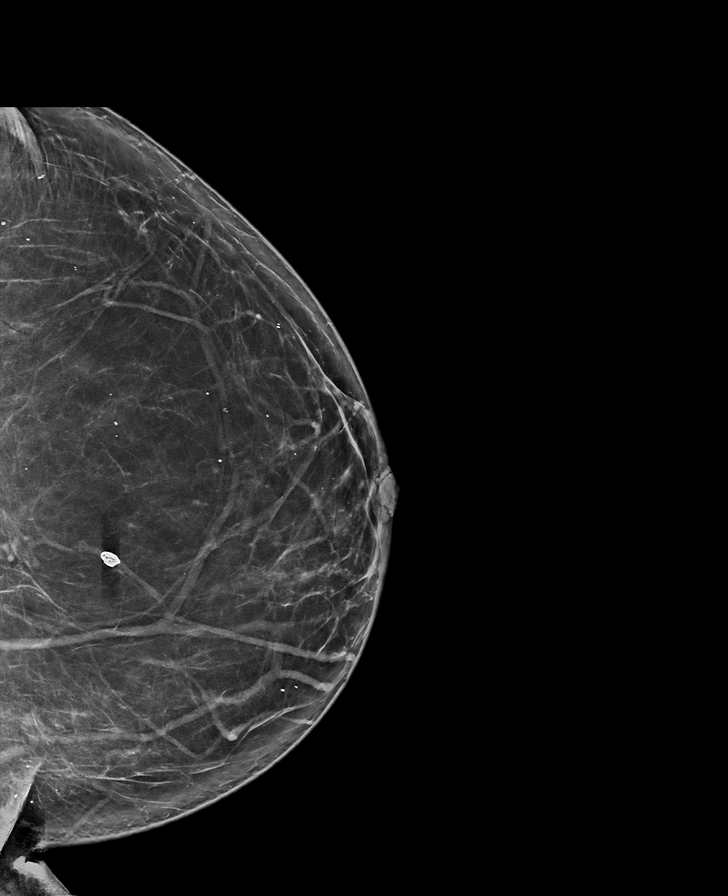

[R MLO synth-2D]
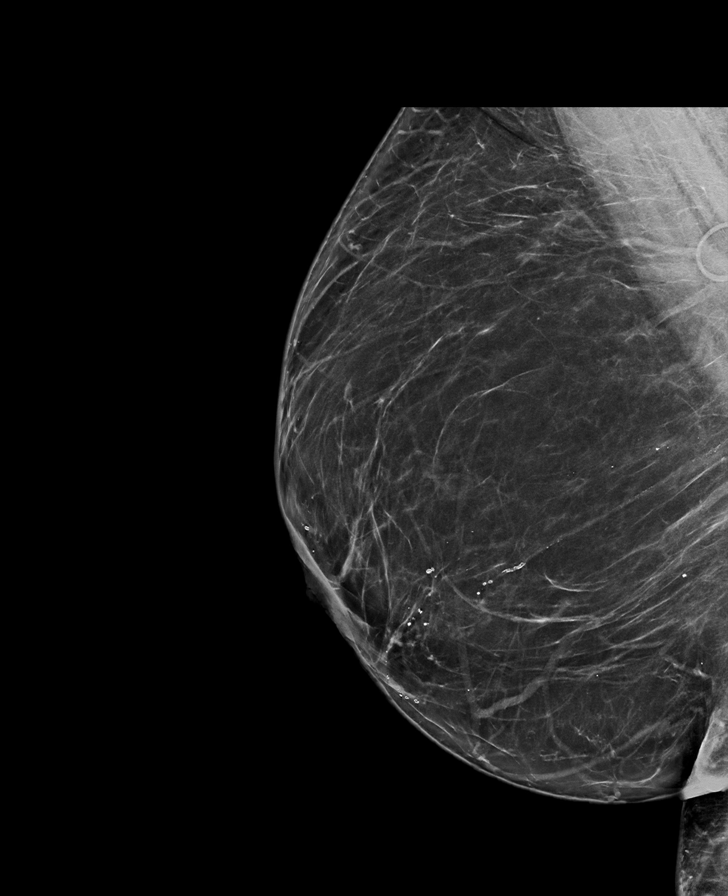

[R CC tomo · tomo slice 36/71.0]
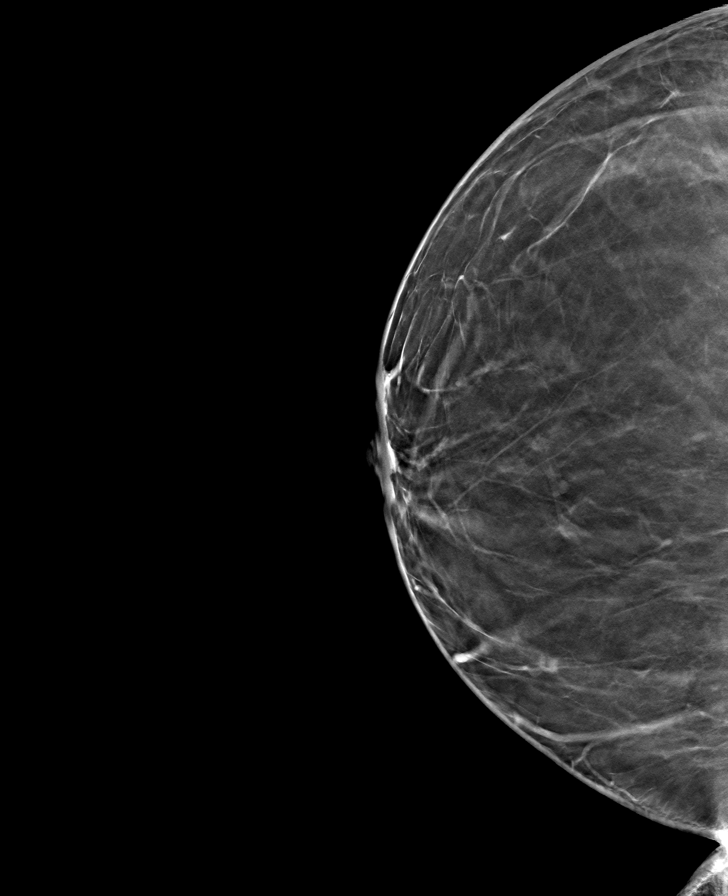

[L MLO tomo · tomo slice 37/73.0]
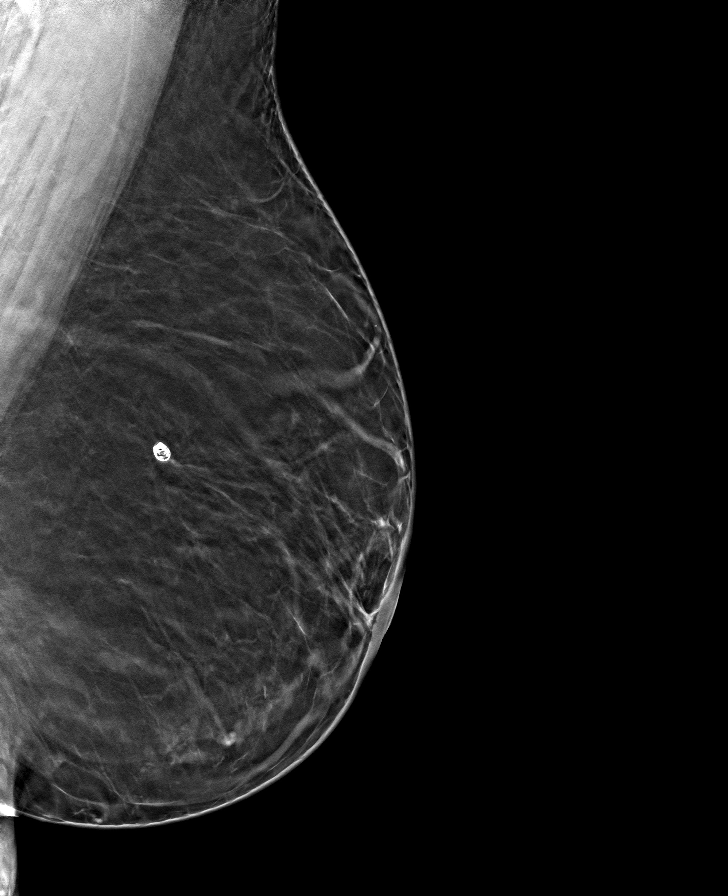

[L CC tomo · tomo slice 39/76.0]
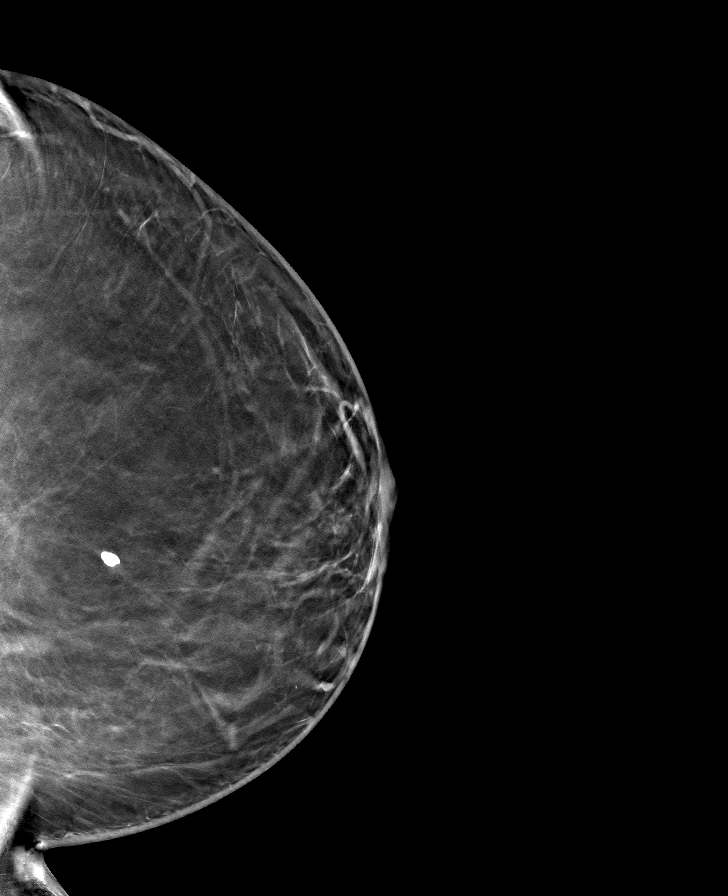

[R MLO tomo · tomo slice 41/81.0]
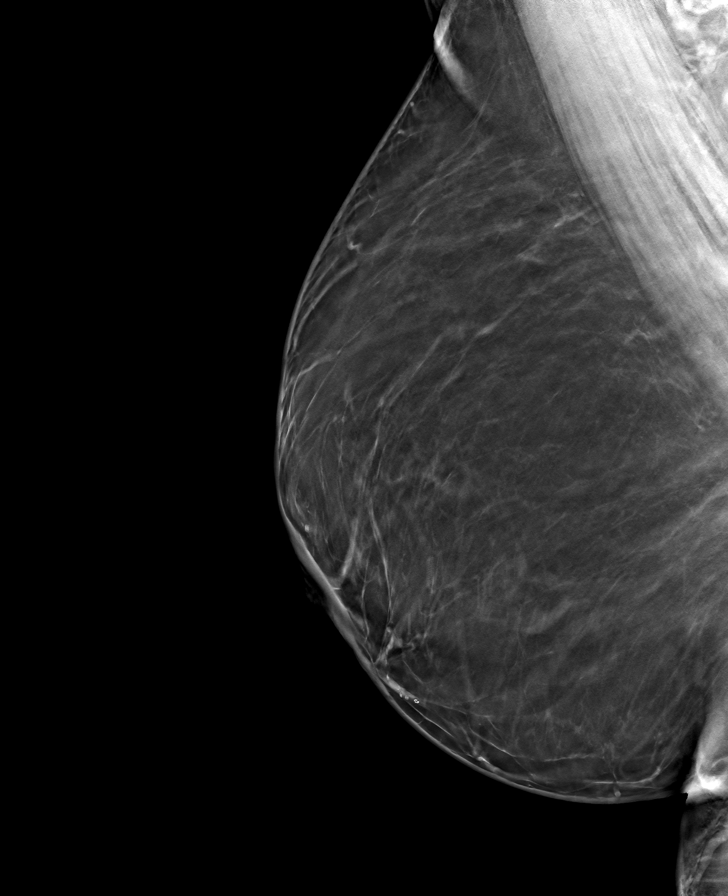

[8 of 24 positions shown; findings below may reference images not displayed]

ACR Breast Density Category b: There are scattered areas of
fibroglandular density.
FINDINGS: There are no findings suspicious for malignancy. Images were
processed with CAD.
IMPRESSION: No mammographic evidence of malignancy. A result letter of this
screening mammogram will be mailed directly to the patient.

RECOMMENDATION:
Screening mammogram in one year. (Code:[TQ])

BI-RADS CATEGORY  1: Negative.

## 2018-12-23 ENCOUNTER — Ambulatory Visit: Payer: Managed Care, Other (non HMO) | Admitting: Gastroenterology

## 2018-12-26 ENCOUNTER — Encounter (INDEPENDENT_AMBULATORY_CARE_PROVIDER_SITE_OTHER): Payer: Managed Care, Other (non HMO) | Admitting: Ophthalmology
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
## 2019-01-20 ENCOUNTER — Other Ambulatory Visit: Payer: Self-pay

## 2019-01-20 ENCOUNTER — Ambulatory Visit (INDEPENDENT_AMBULATORY_CARE_PROVIDER_SITE_OTHER): Payer: Managed Care, Other (non HMO) | Admitting: Ophthalmology

## 2019-01-20 DIAGNOSIS — I10 Essential (primary) hypertension: Secondary | ICD-10-CM

## 2019-01-20 DIAGNOSIS — H35033 Hypertensive retinopathy, bilateral: Secondary | ICD-10-CM

## 2019-01-20 DIAGNOSIS — H3581 Retinal edema: Secondary | ICD-10-CM | POA: Diagnosis not present

## 2019-01-20 DIAGNOSIS — E113413 Type 2 diabetes mellitus with severe nonproliferative diabetic retinopathy with macular edema, bilateral: Secondary | ICD-10-CM

## 2019-01-20 DIAGNOSIS — H25813 Combined forms of age-related cataract, bilateral: Secondary | ICD-10-CM

## 2019-01-21 ENCOUNTER — Encounter (INDEPENDENT_AMBULATORY_CARE_PROVIDER_SITE_OTHER): Payer: Self-pay | Admitting: Ophthalmology

## 2019-01-21 MED ORDER — BEVACIZUMAB CHEMO INJECTION 1.25MG/0.05ML SYRINGE FOR KALEIDOSCOPE
1.2500 mg | INTRAVITREAL | Status: AC | PRN
  Administered 2019-01-21: 1.25 mg via INTRAVITREAL

## 2019-01-23 ENCOUNTER — Encounter (INDEPENDENT_AMBULATORY_CARE_PROVIDER_SITE_OTHER): Payer: MEDICAID | Admitting: Ophthalmology

## 2019-01-28 ENCOUNTER — Ambulatory Visit (INDEPENDENT_AMBULATORY_CARE_PROVIDER_SITE_OTHER): Payer: Managed Care, Other (non HMO) | Admitting: Family Medicine

## 2019-01-28 ENCOUNTER — Other Ambulatory Visit: Payer: Self-pay

## 2019-01-28 VITALS — BP 118/72 | HR 104 | Wt 249.0 lb

## 2019-01-28 DIAGNOSIS — E1159 Type 2 diabetes mellitus with other circulatory complications: Secondary | ICD-10-CM | POA: Diagnosis not present

## 2019-01-28 DIAGNOSIS — I10 Essential (primary) hypertension: Secondary | ICD-10-CM

## 2019-01-28 DIAGNOSIS — K5900 Constipation, unspecified: Secondary | ICD-10-CM

## 2019-01-28 DIAGNOSIS — E1169 Type 2 diabetes mellitus with other specified complication: Secondary | ICD-10-CM | POA: Diagnosis not present

## 2019-01-28 DIAGNOSIS — R109 Unspecified abdominal pain: Secondary | ICD-10-CM

## 2019-01-28 DIAGNOSIS — E7800 Pure hypercholesterolemia, unspecified: Secondary | ICD-10-CM

## 2019-01-28 DIAGNOSIS — I152 Hypertension secondary to endocrine disorders: Secondary | ICD-10-CM

## 2019-01-28 LAB — POCT URINALYSIS DIP (MANUAL ENTRY)
Bilirubin, UA: NEGATIVE
Blood, UA: NEGATIVE
Glucose, UA: 100 mg/dL — AB
Ketones, POC UA: NEGATIVE mg/dL
Leukocytes, UA: NEGATIVE
Nitrite, UA: NEGATIVE
Protein Ur, POC: NEGATIVE mg/dL
Spec Grav, UA: >=1.03 — AB (ref 1.010–1.025)
Urobilinogen, UA: 0.2 E.U./dL
pH, UA: 5.5 (ref 5.0–8.0)

## 2019-01-28 LAB — POCT GLYCOSYLATED HEMOGLOBIN (HGB A1C): HbA1c, POC (controlled diabetic range): 8.5 % — AB (ref 0.0–7.0)

## 2019-01-28 NOTE — Patient Instructions (Addendum)
It was great meeting you today!  I am sorry about your constipation issues.  Think that you are not using enough MiraLAX to get satisfactory results.  I recommend this to start at one capful and increase by a half Every 1 to 2 days to get the consistency and frequency you are hoping for.  In regards to your flank pain.  We performed a urinalysis which showed no abnormality.  We will get a urine culture as this is the definitive test for a kidney infection.  We will also get some lab work on your way out to show your kidney function.  Your hemoglobin A1c improved from 8.8->8.5.  Continue the good work.  I would advise you to meet with your PCP sometime in the near future to discuss treatment options.

## 2019-01-29 LAB — BASIC METABOLIC PANEL
BUN/Creatinine Ratio: 16 (ref 9–23)
BUN: 19 mg/dL (ref 6–24)
CO2: 21 mmol/L (ref 20–29)
Calcium: 10.7 mg/dL — ABNORMAL HIGH (ref 8.7–10.2)
Chloride: 99 mmol/L (ref 96–106)
Creatinine, Ser: 1.21 mg/dL — ABNORMAL HIGH (ref 0.57–1.00)
GFR calc Af Amer: 61 mL/min/{1.73_m2} (ref >59)
GFR calc non Af Amer: 53 mL/min/{1.73_m2} — ABNORMAL LOW (ref >59)
Glucose: 254 mg/dL — ABNORMAL HIGH (ref 65–99)
Potassium: 4.2 mmol/L (ref 3.5–5.2)
Sodium: 136 mmol/L (ref 134–144)

## 2019-01-30 LAB — URINE CULTURE

## 2019-02-03 ENCOUNTER — Encounter: Payer: Self-pay | Admitting: Family Medicine

## 2019-02-03 DIAGNOSIS — G8929 Other chronic pain: Secondary | ICD-10-CM | POA: Insufficient documentation

## 2019-02-03 DIAGNOSIS — R109 Unspecified abdominal pain: Secondary | ICD-10-CM | POA: Insufficient documentation

## 2019-02-03 NOTE — Progress Notes (Signed)
   HPI 48 year old female who presents for right flank pain and constipation.  She states that she has only been having 1-2 bowel movements per week for the past few weeks. She states that she has not tried anything but miralax. She has only tried about one capful per day and only tried this one or two times.  For her right flank pain she says that it feels like the time she was in "kidney failure". The pain is unilateral on her right side and started when she woke up one morning. She has not had any burning with urination. The pain is described as sharp and like a 9/10.  CC: right flank pain, constipation   ROS:   Review of Systems See HPI for ROS.   CC, SH/smoking status, and VS noted  Objective: BP 118/72   Pulse (!) 104   Wt 249 lb (112.9 kg)   SpO2 99%   BMI 36.77 kg/m  Gen: 48 year old AA female, no acute distress CV: RRR, No M/R/G Resp: Lungs CTAB Abd: soft, nt, nd Neuro: Alert and oriented, Speech clear, No gross deficits R flank: mild palpable tenderness to right CVA  Ua: normal   Assessment and plan:  Type 2 diabetes mellitus with hypercholesterolemia (HCC) A1C improved to 8.5 from 8.8. Continue current regiment of victoza, metformin, basaglar 30U. Recommended follow up with PCP for further regimen management.  Hypertension associated with diabetes ([place]) BP 118/72. Continue current doses of coreg and hyzaar. Follow up with pcp for further management.  Flank pain Given history, physical exam findings and negative UA and Urine culture her flank pain is likely msk in etiology. Recommend tylenol, heating pad/ice, and perhaps topical liniments. Can follow up if symptoms do no improve.  Constipation 1-2 BM per week. Has tried very small amount of miralax for very short amount of time. Gave instructions for titration up to 3 capfuls if needed. Can follow up with pcp if does not get desired results and follows titration plan (increase by 1/2 cap every 1-2 days) up to  3 capfuls   Orders Placed This Encounter  Procedures  . Urine culture  . Basic Metabolic Panel  . HgB A1c  . POCT urinalysis dipstick    No orders of the defined types were placed in this encounter.    Guadalupe Dawn MD PGY-3 Family Medicine Resident  02/03/2019 11:22 PM

## 2019-02-03 NOTE — Assessment & Plan Note (Signed)
BP 118/72. Continue current doses of coreg and hyzaar. Follow up with pcp for further management.

## 2019-02-03 NOTE — Assessment & Plan Note (Signed)
Given history, physical exam findings and negative UA and Urine culture her flank pain is likely msk in etiology. Recommend tylenol, heating pad/ice, and perhaps topical liniments. Can follow up if symptoms do no improve.

## 2019-02-03 NOTE — Assessment & Plan Note (Signed)
A1C improved to 8.5 from 8.8. Continue current regiment of victoza, metformin, basaglar 30U. Recommended follow up with PCP for further regimen management.

## 2019-02-03 NOTE — Assessment & Plan Note (Signed)
1-2 BM per week. Has tried very small amount of miralax for very short amount of time. Gave instructions for titration up to 3 capfuls if needed. Can follow up with pcp if does not get desired results and follows titration plan (increase by 1/2 cap every 1-2 days) up to 3 capfuls

## 2019-02-06 DIAGNOSIS — D126 Benign neoplasm of colon, unspecified: Secondary | ICD-10-CM

## 2019-02-06 HISTORY — DX: Benign neoplasm of colon, unspecified: D12.6

## 2019-02-25 ENCOUNTER — Encounter (INDEPENDENT_AMBULATORY_CARE_PROVIDER_SITE_OTHER): Payer: MEDICAID | Admitting: Ophthalmology

## 2019-02-27 ENCOUNTER — Telehealth: Payer: Self-pay | Admitting: *Deleted

## 2019-02-27 NOTE — Telephone Encounter (Signed)
Received fax from pharmacy, PA needed on [place].  Clinical questions submitted via Cover My Meds.  Waiting on response, could take up to 72 hours.  Cover My Meds info: Key: [id number]  Alicia West, CMA

## 2019-03-03 ENCOUNTER — Other Ambulatory Visit: Payer: Self-pay | Admitting: Family Medicine

## 2019-03-03 ENCOUNTER — Encounter: Payer: Self-pay | Admitting: Family Medicine

## 2019-03-03 ENCOUNTER — Telehealth: Payer: Self-pay | Admitting: Family Medicine

## 2019-03-03 DIAGNOSIS — E1149 Type 2 diabetes mellitus with other diabetic neurological complication: Secondary | ICD-10-CM

## 2019-03-03 DIAGNOSIS — H542X22 Low vision right eye category 2, low vision left eye category 2: Secondary | ICD-10-CM

## 2019-03-03 DIAGNOSIS — E1165 Type 2 diabetes mellitus with hyperglycemia: Secondary | ICD-10-CM

## 2019-03-03 DIAGNOSIS — E11311 Type 2 diabetes mellitus with unspecified diabetic retinopathy with macular edema: Secondary | ICD-10-CM

## 2019-03-03 DIAGNOSIS — E113413 Type 2 diabetes mellitus with severe nonproliferative diabetic retinopathy with macular edema, bilateral: Secondary | ICD-10-CM

## 2019-03-03 NOTE — Progress Notes (Signed)
Consultation to Chronic Care Management to request pharmacy assistance in finding a GLP-1 inhibitor covered by patient's insurance.  Victoza was denied on Prior Authorization by [facility].

## 2019-03-03 NOTE — Chronic Care Management (AMB) (Signed)
  Care Management   Note  03/03/2019 Name: Alicia West MRN: [id number] DOB: 17-Jan-1971  Alicia West is a 49 y.o. year old female who is a primary care patient of McDiarmid, Blane Ohara, MD. I reached out to Charlett Nose by phone today in response to a referral sent by Alicia West's health plan.    [name] was given information about care management services today including:  1. Care management services include personalized support from designated clinical staff supervised by her physician, including individualized plan of care and coordination with other care providers 2. 24/7 contact phone numbers for assistance for urgent and routine care needs. 3. The patient may stop care management services at any time by phone call to the office staff.  Patient agreed to services and verbal consent obtained.   Follow up plan: Telephone appointment with care management team member scheduled for:03/06/2019  Glenna Durand, [place] Management ??[email or web address] ??[phone]

## 2019-03-03 NOTE — Progress Notes (Signed)
Consultation to Chronic Care Management for pharmacy assistance in finding a GLP-1 inhibitor covered by patient's insurance.  Victoza was denied on Prior Authorization by [facility].

## 2019-03-04 NOTE — Telephone Encounter (Signed)
Spoke with pharmacy, [place] still denied, they suggest calling the insurance company to see what the issue is.    The response they get in their system is "trial of 1.2 dose/2 pack, not to exceed 6 per 10 days"  Number provided is [phone], will ask CCM Lottie Dawson) to assist per message from Dr. Wendy Poet.  Christen Bame, CMA

## 2019-03-05 ENCOUNTER — Ambulatory Visit: Payer: Self-pay | Admitting: Pharmacist

## 2019-03-05 DIAGNOSIS — E1169 Type 2 diabetes mellitus with other specified complication: Secondary | ICD-10-CM

## 2019-03-05 DIAGNOSIS — E7800 Pure hypercholesterolemia, unspecified: Secondary | ICD-10-CM

## 2019-03-05 NOTE — Telephone Encounter (Signed)
Patient calling nurse line to follow up on medication management. Per patient, insurance carrier switched at the beginning of the year and  victoza will now cost her $450. Patient requesting switch in medications.   To PCP  Talbot Grumbling, RN

## 2019-03-05 NOTE — Progress Notes (Signed)
  Chronic Care Management   Outreach Note  03/05/2019 Name: Alicia West MRN: [id number] DOB: 1970-07-08  Referred by: McDiarmid, Blane Ohara, MD Reason for referral : Chronic Care Management    Follow Up Plan: Face to Face appointment with care management team member scheduled for:  03/10/19 at 11am Will discuss GLP1 therapies and determine which medication is best for patient and insurance  SIGNATURE Regina Eck, PharmD, BCPS Clinical Pharmacist, [place]: [phone]

## 2019-03-05 NOTE — Telephone Encounter (Signed)
Alicia West,  Could you help Alicia West get a GLP-1 inhibitor that is covered by her new insurance policy?  Thank you, Illene Silver McDiarmid, MD

## 2019-03-06 ENCOUNTER — Other Ambulatory Visit: Payer: Self-pay

## 2019-03-06 ENCOUNTER — Telehealth: Payer: Self-pay | Admitting: Pharmacist

## 2019-03-06 NOTE — Telephone Encounter (Signed)
Pt calling nurse line to follow up on alternative to [place]. Pt states that she has been without this medication for two weeks and does not know what to do.   Talbot Grumbling, RN

## 2019-03-06 NOTE — Telephone Encounter (Signed)
Please let Ms Redmond Pulling know that Dr Luwana Butrick has asked our pharmacists to see if their is an affordable alternative to Victoza.  Dr Issa Luster is awaiting their reply.

## 2019-03-06 NOTE — Telephone Encounter (Signed)
Spoke with pt informed her of note. Pt understood and said that she has an appt Tuesday. Salvatore Marvel, CMA

## 2019-03-10 ENCOUNTER — Other Ambulatory Visit: Payer: Self-pay

## 2019-03-10 ENCOUNTER — Ambulatory Visit: Payer: Self-pay | Admitting: Pharmacist

## 2019-03-10 ENCOUNTER — Encounter (INDEPENDENT_AMBULATORY_CARE_PROVIDER_SITE_OTHER): Payer: Self-pay

## 2019-03-10 ENCOUNTER — Encounter (INDEPENDENT_AMBULATORY_CARE_PROVIDER_SITE_OTHER): Payer: 59 | Admitting: Ophthalmology

## 2019-03-10 DIAGNOSIS — E1169 Type 2 diabetes mellitus with other specified complication: Secondary | ICD-10-CM

## 2019-03-10 MED ORDER — OZEMPIC (0.25 OR 0.5 MG/DOSE) 2 MG/1.5ML ~~LOC~~ SOPN: 0.5000 mg | PEN_INJECTOR | SUBCUTANEOUS | 0 refills | Status: DC

## 2019-03-10 NOTE — Progress Notes (Signed)
  Chronic Care Management   Outreach Note  03/10/2019 Name: Alicia West MRN: [id number] DOB: July 26, 1970  Referred by: McDiarmid, Blane Ohara, MD Reason for referral : Chronic Care Management (Diabetes)  Working to find the best GLP1 option that is affordable.  Patient has high deductible plan.  Discussed this switch from Victoza to [place] with Dr. Sherren Mocha McDiarmid.  Ozempic sample given to patient today (1-box, RPZ#[id number], exp 02/2021).  Will start her on Ozempic 0.60m weekly x2 weeks then increase to 0.537mweekly.  Ozempic 0.15m615meekly-3 month supply called in to [facility] request.   A1c is 8.5%.  FBG 150-180.  Patient continues on basaglar 30 units nightly and metformin 500m71mD.  Will continue to follow and adjust.  Ozempic copay card registered in patient's name: copay card bin [id number]: [id number]: 54 [id number] Up Plan: will f/u in 1 week to see insurance outcome   JuliRegina EckarmD, BCPS Clinical Pharmacist, [phone]

## 2019-03-12 ENCOUNTER — Telehealth: Payer: Self-pay | Admitting: *Deleted

## 2019-03-12 NOTE — Telephone Encounter (Signed)
Medication was approved.  Called pharmacy but this will still cost her $500, the issue per [facility] is that the pt has a $5000 deductible. Will forward to Lottie Dawson for next steps.  Christen Bame, CMA

## 2019-03-12 NOTE — Telephone Encounter (Signed)
Received fax from pharmacy, PA needed on ozempic.  Clinical questions submitted via Cover My Meds.  Waiting on response, could take up to 72 hours.  Cover My Meds info: Key: [id number]  Christen Bame, CMA

## 2019-03-17 ENCOUNTER — Other Ambulatory Visit: Payer: Self-pay | Admitting: Family Medicine

## 2019-03-17 NOTE — Telephone Encounter (Signed)
Dr Blanca Friend -  Is there a medication assistance program for SGLT2 inhibitors? If not, how about DPP4 inhibitors?

## 2019-03-18 ENCOUNTER — Ambulatory Visit: Payer: Self-pay | Admitting: Pharmacist

## 2019-03-18 ENCOUNTER — Other Ambulatory Visit: Payer: Self-pay | Admitting: Family Medicine

## 2019-03-18 DIAGNOSIS — I10 Essential (primary) hypertension: Secondary | ICD-10-CM

## 2019-03-18 DIAGNOSIS — E7800 Pure hypercholesterolemia, unspecified: Secondary | ICD-10-CM

## 2019-03-18 DIAGNOSIS — E1169 Type 2 diabetes mellitus with other specified complication: Secondary | ICD-10-CM

## 2019-03-18 NOTE — Progress Notes (Signed)
  Chronic Care Management   Outreach Note  03/18/2019 Name: Alicia West MRN: [id number] DOB: 17-May-1970  Referred by: McDiarmid, Blane Ohara, MD Reason for referral : No chief complaint on file.   An unsuccessful telephone outreach was attempted today. The patient was referred to the case management team for assistance with care management and care coordination.  Call placed to [facility] to attempt to call in copay card for Ozempic 47-monthsupply.  Their system was down.  Will attempt later today.  Follow Up Plan: A HIPPA compliant phone message was left for the patient providing contact information and requesting a return call.  The care management team will reach out to the patient again over the next 3-5 days.   SIGNATURE JRegina Eck PharmD, BCPS Clinical Pharmacist, [place] [phone]

## 2019-04-16 ENCOUNTER — Telehealth: Payer: Self-pay

## 2019-04-16 DIAGNOSIS — E1165 Type 2 diabetes mellitus with hyperglycemia: Secondary | ICD-10-CM

## 2019-04-16 NOTE — Telephone Encounter (Signed)
Patient LVM on nurse line regarding receiving samples of ozempic. Order for Ozempic was discontinued due to cost, unsure if patient is still supposed to be receiving medication.   Please advise  To PCP and pharmacy team  Talbot Grumbling, RN

## 2019-04-17 ENCOUNTER — Ambulatory Visit: Payer: Self-pay | Admitting: Pharmacist

## 2019-04-17 DIAGNOSIS — E1169 Type 2 diabetes mellitus with other specified complication: Secondary | ICD-10-CM

## 2019-04-17 DIAGNOSIS — E7800 Pure hypercholesterolemia, unspecified: Secondary | ICD-10-CM

## 2019-04-17 MED ORDER — OZEMPIC (0.25 OR 0.5 MG/DOSE) 2 MG/1.5ML ~~LOC~~ SOPN: 0.7500 mg | PEN_INJECTOR | SUBCUTANEOUS | 0 refills | Status: DC

## 2019-04-17 NOTE — Assessment & Plan Note (Signed)
States food is tasting better with Ozempic and she fears her weight has increases since switch from Victoza (liraglutide) to Ozempic.   Reports her blood sugar readings are consistently low 200s.  Denies any GI adverse effects.   Ozempic dose 0.60m weekly.   Following discussion of blood sugars, we agreed a trial of higher doses was appropriate.  She was instructed and agreed to increase to 0.776mweekly (one shot of 0.5 followed by 0.2512mt the same time) weekly.    I plan to contact her in 20-14 days and reevaluate control - likely to increase dose to 1mg50mekly (2x 0.5mg 33mections) at that time.

## 2019-04-17 NOTE — Progress Notes (Signed)
Chronic Care Management   Visit Note  04/17/2019 Name: Alicia West MRN: [id number] DOB: [birth year]  Referred by: McDiarmid, Blane Ohara, MD Reason for referral : Diabetes   Alicia West is a 49 y.o. year old female who is a primary care patient of McDiarmid, Blane Ohara, MD. The CCM team was consulted for assistance with chronic disease management and care coordination needs related to DMII  Review of patient status, including review of consultants reports, relevant laboratory and other test results, and collaboration with appropriate care team members and the patient's provider was performed as part of comprehensive patient evaluation and provision of chronic care management services.    SDOH (Social Determinants of Health) assessments performed: No See Care Plan activities for detailed interventions related to SDOH)     Medications: Outpatient Encounter Medications as of 04/17/2019  Medication Sig  . Semaglutide,0.25 or 0.5MG/DOS, (OZEMPIC, 0.25 OR 0.5 MG/DOSE,) 2 MG/1.5ML SOPN Inject 0.5 mg into the skin once a week.   . B-D ULTRAFINE III SHORT PEN 31G X 8 MM MISC USE AS DIRECTED UP TO 4 TIMES DAILY  . carvedilol (COREG) 25 MG tablet TAKE ONE TABLET BY MOUTH TWICE A DAY WITH A MEAL  . gabapentin (NEURONTIN) 100 MG capsule Take 1 capsule (100 mg total) by mouth at bedtime.  Marland Kitchen glucose blood (BAYER CONTOUR NEXT TEST) test strip Use to check blood sugar three times a day or as directed.  . hydroquinone 4 % cream Apply topically 2 (two) times daily.  . Insulin Glargine (BASAGLAR KWIKPEN) 100 UNIT/ML SOPN Inject 0.3 mLs (30 Units total) into the skin daily. Dispense QS x 1 month  . Lancets MISC Use Contour Next Lancet to check blood glucose 3 times a day.  Dx code E11.65.  Marland Kitchen losartan-hydrochlorothiazide (HYZAAR) 100-25 MG tablet Take 1 tablet by mouth daily.  . metFORMIN (GLUCOPHAGE) 500 MG tablet Take one tablet by mouth 2 times daily with a meal. Patient needs in-person or telephone app't  with Dr McDiarmid before any further refills  . NOVOTWIST 32G X 5 MM MISC USE ALONG WITH INSULIN INJECTIONS UP TO 4 X DAILY  . spironolactone (ALDACTONE) 25 MG tablet Take 1 tablet (25 mg total) by mouth daily.  . Syringe, Disposable, 1 ML MISC 1 each by Does not apply route 3 (three) times daily.   No facility-administered encounter medications on file as of 04/17/2019.     Objective:   Goals Addressed            This Visit's Progress     Patient Stated   . I would like to manage my diabetes (pt-stated)       CARE PLAN ENTRY (see longtitudinal plan of care for additional care plan information)  Current Barriers:  . Diabetes: U2GU; complicated by chronic medical conditions including HTN, HLD, most recent A1c 8.5% in 01/2019 . Most recent eGFR: 68m/min . Current antihyperglycemic regimen: Basaglar 30u qHS, Ozempic 0.533mweekly, metformin 50063mID o Difficulty obtaining GLP-1 given patient's high deductible plan o LilBJ'sogram information given to patient for BasWESCO InternationalAssisted patient with activation of account.  Copay should cap at $35/month for BasIrwinShe will also get the 1st month free with additional card provided to patient at last visit o Collaboration with Dr. KovValentina Lucksr OzeSt. Stephen[place].  Will attempt to fill under insurance .  May have to transition to Trulicity under [employer] solutions as well if not reasonably covered . Denies hypoglycemic symptoms, including dizziness, lightheadedness,  shaking, sweating . Reports hyperglycemic symptoms, including polyuria, polydipsia, headaches, neuropathy . Current meal patterns: patient admits to snacking more often; discussed balanced diet with protein, low carb, and incorporating more veggies.  Recommend for patient to increase water intake . Current exercise: discussed plans to initiate . Current blood glucose readings: 180-200s . Cardiovascular risk reduction: o Current hypertensive regimen: BP last visit was  117/82,  goal BP <130/80--> carvedilol, losartan/hctz, spironolactone o Current hyperlipidemia regimen: LDL 139 in 10/2018; previously on atorvastatin, but stopped due to myopathy.  Discussed options and will continue to explore.  Patient would like to become stable Bgs are current meds before initiating a new medication. o Current antiplatelet regimen: n/a  Pharmacist Clinical Goal(s):  Marland Kitchen Over the next 90 days, patient will work with PharmD and primary care provider to address needs related to diabetes management  Interventions: . Comprehensive medication review performed, medication list updated in electronic medical record . Counseled patient on diet & exercise/lifestyle modications  Patient Self Care Activities:  . Patient will check blood glucose daily , document, and provide at future appointments . Patient will focus on medication adherence  . Patient will take medications as prescribed . Patient will contact provider with any episodes of hypoglycemia . Patient will report any questions or concerns to provider   Initial goal documentation         Plan:   The care management team will reach out to the patient again over the next 7 days.    Provider Signature Regina Eck, PharmD, BCPS Clinical Pharmacist, [place]: [phone]

## 2019-04-17 NOTE — Telephone Encounter (Signed)
Noted and agree. 

## 2019-04-17 NOTE — Progress Notes (Signed)
  Chronic Care Management   Outreach Note  03/10/2019 Name: Alicia West MRN: [id number] DOB: [birth year]  Referred by: McDiarmid, Blane Ohara, MD Reason for referral : Chronic Care Management (Diabetes)  Working to find the best GLP1 option that is affordable.  Patient has high deductible plan.  Discussed this switch from Victoza to [place] with Dr. Sherren Mocha McDiarmid.  Ozempic sample given to patient today (1-box, PJK#[id number], exp 02/2021).  Will start her on Ozempic 0.87m weekly x4  weeks then increase to 0.537mweekly as tolerated.  Ozempic 0.45m54meekly-3 [facility].   A1c is 8.5%.  FBG 150-180.  Patient continues on Basaglar 30 units nightly and metformin 500m245mD.  Will continue to follow and adjust.  Ozempic copay card registered in patient's name: copay card bin [id number]: [id number]: 54 [id number] Up Plan: will f/u in 1 week to see insurance outcome   JuliRegina EckarmD, BCPS Clinical Pharmacist, [phone]

## 2019-04-17 NOTE — Telephone Encounter (Signed)
Patient arrives and requested samples of Ozempic (semaglutide).   States food is tasting better with Ozempic and she fears her weight has increases since switch from Victoza (liraglutide) to Ozempic.   Reports her blood sugar readings are consistently low 200s.  Denies any GI adverse effects.   Ozempic dose 0.78m weekly.   Following discussion of blood sugars, we agreed a trial of higher doses was appropriate.  She was instructed and agreed to increase to 0.759mweekly (one shot of 0.5 followed by 0.2552mt the same time) weekly.    I plan to contact her in 20-14 days and reevaluate control - likely to increase dose to 1mg28mekly (2x 0.5mg 32mections) at that time.   Medication Samples have been provided to the patient.  Drug name: Ozempic             Qty: 5 pens (2 month + supply)  LOT: [id number].Date: 03/07/2021  Dosing instructions: 0.75mg 16mly x 2 weeks then plan increase to 1mg (287m.5mg inj16mions) weekly.   The patient has been instructed regarding the correct time, dose, and frequency of taking this medication, including desired effects and most common side effects.   Peter KoJaneann ForehandM 04/17/2019

## 2019-04-18 ENCOUNTER — Other Ambulatory Visit: Payer: Self-pay | Admitting: Family Medicine

## 2019-04-18 DIAGNOSIS — E7800 Pure hypercholesterolemia, unspecified: Secondary | ICD-10-CM

## 2019-04-18 DIAGNOSIS — E1165 Type 2 diabetes mellitus with hyperglycemia: Secondary | ICD-10-CM

## 2019-04-18 DIAGNOSIS — E1169 Type 2 diabetes mellitus with other specified complication: Secondary | ICD-10-CM

## 2019-04-28 ENCOUNTER — Telehealth: Payer: Self-pay | Admitting: Pharmacist

## 2019-04-28 NOTE — Telephone Encounter (Signed)
Patient contacted RE blood sugar control.  States she has been able to tolerate the 0.63m dose of Ozempic.  Minimal nausea.  Blood sugar readings reported as 160- low 200s.    Has not increased dose from previous recommendation.  Minimal nausea reported.  "Able to tolerate" the Ozempic.   Asked to continue with 0.539mdaily and we would reassess higher dose at call in 2 weeks.  She was comfortable with that plan.

## 2019-05-12 ENCOUNTER — Telehealth: Payer: Self-pay | Admitting: Pharmacist

## 2019-05-12 NOTE — Telephone Encounter (Signed)
Contacted patient to assess side effects and blood sugar control with use of Ozempic - semglutide (4.1PF plus 2 clicks).  Patient reported only mild nausea with use of higher dose.  She states her blood sugars are ~ 160s (denies anything higher than 250.   Plan to continue current regimen - no changes at this time.  Asked patient to call if hypoglycemia (any blood sugar readings less than 80) or any concerns with Ozempic.   We agreed to a phone follow-up to reassess control in 4 weeks.  I will call her at that time.

## 2019-06-10 ENCOUNTER — Telehealth: Payer: Self-pay | Admitting: Pharmacist

## 2019-06-10 DIAGNOSIS — E1165 Type 2 diabetes mellitus with hyperglycemia: Secondary | ICD-10-CM

## 2019-06-10 DIAGNOSIS — E1169 Type 2 diabetes mellitus with other specified complication: Secondary | ICD-10-CM

## 2019-06-10 MED ORDER — BASAGLAR KWIKPEN 100 UNIT/ML ~~LOC~~ SOPN: 34.0000 [IU] | PEN_INJECTOR | Freq: Every day | SUBCUTANEOUS | 5 refills | Status: DC

## 2019-06-10 NOTE — Telephone Encounter (Signed)
Noted and agree. 

## 2019-06-10 NOTE — Telephone Encounter (Signed)
Contacted patient RE blood glucose control.  Patient reports much improved blood glucose readings.  She reports a fasting reading of 116 with some feelings of hypoglycemia at that level.  Majority of readings are much improved.  She states she has lost 7 pounds.  She would like to lose more weight before returning for another PCP visit.   She is taking Ozempic 0.62m once weekly and is continuing to have a small amount of nausea most typically on days #2 and 3 of the dosing week.  She continues on Basaglar at 34 units once daily.  She denies any severe hypoglycemic episodes requiring management or readings < 80.   She requested new prescription of Basaglar sent to her pharmacy.  I sent new Rx for 34 units once daily, 1 month supply with additional refills.  She has adequate supply of Ozempic at this time.   We agree to decrease her Ozempic dose from 0.540mto [0[4.4inus 2 clicks] in attempt to decrease or eliminate nausea.  Continue same dose Metformin and Basaglar, same doses at this time.   I plan to call patient in 1 month to assess progress.   I plan phone follow-up in 1 month  Patient plans to schedule PCP visit in July (2 months).

## 2019-06-16 ENCOUNTER — Other Ambulatory Visit: Payer: Self-pay | Admitting: *Deleted

## 2019-06-16 NOTE — Telephone Encounter (Signed)
Patient is requesting the techlite pen needles due the cost being more affordable.  Alicia West,CMA

## 2019-06-17 MED ORDER — TECHLITE PEN NEEDLES 32G X 6 MM MISC: 5 refills | Status: DC

## 2019-07-11 ENCOUNTER — Telehealth: Payer: Self-pay | Admitting: Pharmacist

## 2019-07-11 NOTE — Telephone Encounter (Signed)
Attempted call to follow-up on blood glucose control with Ozempic.    Plan additional follow-up within the next week.

## 2019-07-29 ENCOUNTER — Encounter: Payer: Self-pay | Admitting: Family Medicine

## 2019-07-29 ENCOUNTER — Ambulatory Visit (INDEPENDENT_AMBULATORY_CARE_PROVIDER_SITE_OTHER): Payer: 59 | Admitting: Family Medicine

## 2019-07-29 ENCOUNTER — Other Ambulatory Visit: Payer: Self-pay

## 2019-07-29 VITALS — BP 114/64 | HR 94 | Wt 243.0 lb

## 2019-07-29 DIAGNOSIS — R5383 Other fatigue: Secondary | ICD-10-CM | POA: Diagnosis not present

## 2019-07-29 DIAGNOSIS — K5900 Constipation, unspecified: Secondary | ICD-10-CM | POA: Diagnosis not present

## 2019-07-29 DIAGNOSIS — E1165 Type 2 diabetes mellitus with hyperglycemia: Secondary | ICD-10-CM | POA: Diagnosis not present

## 2019-07-29 LAB — POCT URINALYSIS DIP (MANUAL ENTRY)
Bilirubin, UA: NEGATIVE
Blood, UA: NEGATIVE
Glucose, UA: NEGATIVE mg/dL
Leukocytes, UA: NEGATIVE
Nitrite, UA: NEGATIVE
Protein Ur, POC: NEGATIVE mg/dL
Spec Grav, UA: >=1.03 — AB (ref 1.010–1.025)
Urobilinogen, UA: 0.2 E.U./dL
pH, UA: 5.5 (ref 5.0–8.0)

## 2019-07-29 NOTE — Patient Instructions (Addendum)
It was great to see you!  Our plans for today:  - Take miralax daily until you have regular bowel movements. Increase this to twice daily after a few days if you aren't having good bowel movements. - We are checking some labs today, we will call you or send you a letter with these results. - Let your dentist know to send [place] any paperwork he/she wants [place] to fill out.  - We will let you know if there are any changes to medications after we get your labs back. - Let us know if you change your mind about seeing a counselor. - Come back in 2-4weeks.  Take care and seek immediate care sooner if you develop any concerns.   Dr. Johns[facility]

## 2019-07-29 NOTE — Progress Notes (Signed)
   SUBJECTIVE:   CHIEF COMPLAINT / HPI:   Dental surgery - getting 4 teeth removed with bone grafting for implant, not sure when. - needs to get A1c checked prior to surgery.  - thinks she will have anesthesia - also needed to get checked out prior to surgery as she has been out of breath and tired.   Fatigue Patient complains of fatigue for the last year. Feels like doing activities but can't: no Does not feel like doing things: yes Fever: no Sweating at night: no Weight Loss: yes, but intentional 6lb in 6 months. Shortness of Breath: yes Coughing up Blood: no Muscle Pain or Weakness: yes Black or bloody Stools: no Feeling Down: yes Not enjoying things: yes Leg or Joint Swelling: no Chest Pain or irregular heart beat:  No, some palpitations.   Constipation Constipation with loose stools Occasional abdominnal cramping Sometimes takes senna. Takes miralax prn. Has BM every 3 days.  Drinks at max 1 bottle of water per day. No sodas. Not much caffeine.  H/o constipation requiring hospitalization 2017  Diabetes, Type 2 - Last A1c 8.5 01/2019 - Medications: Ozempic 0.18m once weekly, basaglar 34u daily, metformin 5088mBID - Compliance: good - Checking BG at home: yes, CBGs 80-120s. 80s twice weekly. - Diet: hard to keep regular meal times. Sometimes doesn't have great appetite.  PERTINENT  PMH / PSH: HTN, T2DM, prurigo nodularis, macular degeneration, mixed incontinence  OBJECTIVE:   BP 114/64   Pulse 94   Wt 243 lb (110.2 kg)   LMP 06/29/2019 (Approximate)   SpO2 98%   BMI 35.88 kg/m   Gen: overweight, in NAD HEENT: dental caries and fillings noted throughout. Adenoids wnl. Card: RRR, no murmur. No LE edema Resp: CTAB, normal WOB on RA Abd: soft, NTND, +BS  Dep[facility] screen [facility]9 07/29/2019 01/28/2019 10/16/2018  Decreased Interest 3 2 0  Down, Depressed, Hopeless 1 1 0  PHQ - 2 Score 4 3 0  Altered sleeping 3 - -  Tired, decreased energy 3 - -  Change  in appetite 3 - -  Feeling bad or failure about yourself  0 - -  Trouble concentrating 3 - -  Moving slowly or fidgety/restless 3 - -  Suicidal thoughts 0 - -  PHQ-9 Score 19 - -  Some recent data might be hidden     ASSESSMENT/PLAN:   Constipation No abnormalities on exam with longstanding history. Miralax daily and titrate for effect.  DM (diabetes mellitus), type 2, uncontrolled ([place]lk FallsWill obtain A1c and call with med changes.  Diet changes discussed.  Fatigue Unclear etiology. May be 2/2 depression given elevated PHQ9 although will obtain CBC, TSH, Vit B12 to rule out contributing factors. Currently declining treatment or counseling. Would assess for potential sleep apnea at followup. F/u in one month.   AlRory PercyDOAmericus

## 2019-07-30 ENCOUNTER — Telehealth: Payer: Self-pay | Admitting: Pharmacist

## 2019-07-30 DIAGNOSIS — E1169 Type 2 diabetes mellitus with other specified complication: Secondary | ICD-10-CM

## 2019-07-30 DIAGNOSIS — E7800 Pure hypercholesterolemia, unspecified: Secondary | ICD-10-CM

## 2019-07-30 DIAGNOSIS — E1165 Type 2 diabetes mellitus with hyperglycemia: Secondary | ICD-10-CM

## 2019-07-30 LAB — CBC
Hematocrit: 32 % — ABNORMAL LOW (ref 34.0–46.6)
Hemoglobin: 10.3 g/dL — ABNORMAL LOW (ref 11.1–15.9)
MCH: 26 pg — ABNORMAL LOW (ref 26.6–33.0)
MCHC: 32.2 g/dL (ref 31.5–35.7)
MCV: 81 fL (ref 79–97)
Platelets: 516 10*3/uL — ABNORMAL HIGH (ref 150–450)
RBC: 3.96 x10E6/uL (ref 3.77–5.28)
RDW: 15.6 % — ABNORMAL HIGH (ref 11.7–15.4)
WBC: 9.8 10*3/uL (ref 3.4–10.8)

## 2019-07-30 LAB — VITAMIN B12: Vitamin B-12: 539 pg/mL (ref 232–1245)

## 2019-07-30 LAB — HEMOGLOBIN A1C
Est. average glucose Bld gHb Est-mCnc: 177 mg/dL
Hgb A1c MFr Bld: 7.8 % — ABNORMAL HIGH (ref 4.8–5.6)

## 2019-07-30 LAB — TSH: TSH: 3.43 u[IU]/mL (ref 0.450–4.500)

## 2019-07-30 MED ORDER — BASAGLAR KWIKPEN 100 UNIT/ML ~~LOC~~ SOPN: 30.0000 [IU] | PEN_INJECTOR | Freq: Every day | SUBCUTANEOUS | Status: DC

## 2019-07-30 MED ORDER — OZEMPIC (0.25 OR 0.5 MG/DOSE) 2 MG/1.5ML ~~LOC~~ SOPN: 0.5000 mg | PEN_INJECTOR | SUBCUTANEOUS | Status: DC

## 2019-07-30 NOTE — Telephone Encounter (Signed)
Patient called and requested dose reduction of Diabetes Medication due to result of her recent A1c (7.8)  We discussed recent blood glucose readings and she denies readings < 100 however she reports that she wakes from sleep with shaking multiple times per week.   We agreed to reduce her insulin glargine from 34 to 30 units each morning.  We will continue Ozempic at 0.72m daily AND Metformin at 5053mBID.   I will plan to follow-up by phone in 1 month AND likely visit with PCP in ~ 2 months.

## 2019-07-31 ENCOUNTER — Other Ambulatory Visit: Payer: Self-pay | Admitting: Family Medicine

## 2019-07-31 DIAGNOSIS — D619 Aplastic anemia, unspecified: Secondary | ICD-10-CM

## 2019-07-31 DIAGNOSIS — D649 Anemia, unspecified: Secondary | ICD-10-CM

## 2019-07-31 NOTE — Assessment & Plan Note (Signed)
No abnormalities on exam with longstanding history. Miralax daily and titrate for effect.

## 2019-07-31 NOTE — Assessment & Plan Note (Signed)
Will obtain A1c and call with med changes.  Diet changes discussed.

## 2019-07-31 NOTE — Assessment & Plan Note (Addendum)
Unclear etiology. May be 2/2 depression given elevated PHQ9 although will obtain CBC, TSH, Vit B12 to rule out contributing factors. Currently declining treatment or counseling. Would assess for potential sleep apnea at followup. F/u in one month.

## 2019-08-05 NOTE — Telephone Encounter (Signed)
Noted and agree. 

## 2019-08-13 ENCOUNTER — Encounter: Payer: Self-pay | Admitting: Family Medicine

## 2019-08-13 ENCOUNTER — Ambulatory Visit (INDEPENDENT_AMBULATORY_CARE_PROVIDER_SITE_OTHER): Payer: 59 | Admitting: Family Medicine

## 2019-08-13 ENCOUNTER — Other Ambulatory Visit: Payer: Self-pay

## 2019-08-13 VITALS — BP 118/74 | HR 106 | Ht 69.0 in | Wt 241.6 lb

## 2019-08-13 DIAGNOSIS — D473 Essential (hemorrhagic) thrombocythemia: Secondary | ICD-10-CM

## 2019-08-13 DIAGNOSIS — Z1159 Encounter for screening for other viral diseases: Secondary | ICD-10-CM

## 2019-08-13 DIAGNOSIS — E1165 Type 2 diabetes mellitus with hyperglycemia: Secondary | ICD-10-CM | POA: Diagnosis not present

## 2019-08-13 DIAGNOSIS — R109 Unspecified abdominal pain: Secondary | ICD-10-CM

## 2019-08-13 DIAGNOSIS — D649 Anemia, unspecified: Secondary | ICD-10-CM

## 2019-08-13 DIAGNOSIS — Z79899 Other long term (current) drug therapy: Secondary | ICD-10-CM

## 2019-08-13 DIAGNOSIS — R1031 Right lower quadrant pain: Secondary | ICD-10-CM

## 2019-08-13 DIAGNOSIS — Z0000 Encounter for general adult medical examination without abnormal findings: Secondary | ICD-10-CM

## 2019-08-13 DIAGNOSIS — I152 Hypertension secondary to endocrine disorders: Secondary | ICD-10-CM

## 2019-08-13 DIAGNOSIS — I10 Essential (primary) hypertension: Secondary | ICD-10-CM

## 2019-08-13 DIAGNOSIS — D75839 Thrombocytosis, unspecified: Secondary | ICD-10-CM

## 2019-08-13 DIAGNOSIS — R7989 Other specified abnormal findings of blood chemistry: Secondary | ICD-10-CM | POA: Diagnosis not present

## 2019-08-13 DIAGNOSIS — R5382 Chronic fatigue, unspecified: Secondary | ICD-10-CM

## 2019-08-13 DIAGNOSIS — R400 Somnolence: Secondary | ICD-10-CM

## 2019-08-13 DIAGNOSIS — E1159 Type 2 diabetes mellitus with other circulatory complications: Secondary | ICD-10-CM

## 2019-08-13 DIAGNOSIS — K760 Fatty (change of) liver, not elsewhere classified: Secondary | ICD-10-CM

## 2019-08-13 DIAGNOSIS — R0683 Snoring: Secondary | ICD-10-CM

## 2019-08-13 HISTORY — DX: Hypercalcemia: E83.52

## 2019-08-13 NOTE — Patient Instructions (Signed)
Your A1c is better, but it is not quite at goal of less than 7.5%.  I will talk with Dr Valentina Lucks about you Ozempic medication.   We are checking your liver, kidneys, electrolyties, calcium, albumin levels today. We are screening you for Hepatitis C, which is done for everyone between ages of 44 to 1.  Please go to [address] [place], to have an X-ray of your right hip to see how much osteoarthritis you have of that hip.   Dr Waylan Busta would like to see you again in about 6-weeks for your Pap Smear to screen for Cervical Cancer.   Dr Jeanpierre Thebeau will send a letter to your oral surgeon saying you are at low risk for heart or breathing complications from the proposed surgery.   Dr Taitum Menton is concerned that you may have Obstructive Sleep Apnea.  He is making a referral to a Sleep Medicine Doctor, Dr Rexene Alberts at [facility]. She can help make the diagnosis and help you treat the problem should you have sleep apnea.      Sleep Apnea Sleep apnea affects breathing during sleep. It causes breathing to stop for a short time or to become shallow. It can also increase the risk of:  Heart attack.  Stroke.  Being very overweight (obese).  Diabetes.  Heart failure.  Irregular heartbeat. The goal of treatment is to help you breathe normally again. What are the causes? There are three kinds of sleep apnea:  Obstructive sleep apnea. This is caused by a blocked or collapsed airway.  Central sleep apnea. This happens when the brain does not send the right signals to the muscles that control breathing.  Mixed sleep apnea. This is a combination of obstructive and central sleep apnea. The most common cause of this condition is a collapsed or blocked airway. This can happen if:  Your throat muscles are too relaxed.  Your tongue and tonsils are too large.  You are overweight.  Your airway is too small. What increases the risk?  Being  overweight.  Smoking.  Having a small airway.  Being older.  Being female.  Drinking alcohol.  Taking medicines to calm yourself (sedatives or tranquilizers).  Having family members with the condition. What are the signs or symptoms?  Trouble staying asleep.  Being sleepy or tired during the day.  Getting angry a lot.  Loud snoring.  Headaches in the morning.  Not being able to focus your mind (concentrate).  Forgetting things.  Less interest in sex.  Mood swings.  Personality changes.  Feelings of sadness (depression).  Waking up a lot during the night to pee (urinate).  Dry mouth.  Sore throat. How is this diagnosed?  Your medical history.  A physical exam.  A test that is done when you are sleeping (sleep study). The test is most often done in a sleep lab but may also be done at home. How is this treated?   Sleeping on your side.  Using a medicine to get rid of mucus in your nose (decongestant).  Avoiding the use of alcohol, medicines to help you relax, or certain pain medicines (narcotics).  Losing weight, if needed.  Changing your diet.  Not smoking.  Using a machine to open your airway while you sleep, such as: ? An oral appliance. This is a mouthpiece that shifts your lower jaw forward. ? A CPAP device. This device blows air through a mask when you breathe out (exhale). ? An EPAP device. This has  valves that you put in each nostril. ? A BPAP device. This device blows air through a mask when you breathe in (inhale) and breathe out.  Having surgery if other treatments do not work. It is important to get treatment for sleep apnea. Without treatment, it can lead to:  High blood pressure.  Coronary artery disease.  In men, not being able to have an erection (impotence).  Reduced thinking ability. Follow these instructions at home: Lifestyle  Make changes that your doctor recommends.  Eat a healthy diet.  Lose weight if  needed.  Avoid alcohol, medicines to help you relax, and some pain medicines.  Do not use any products that contain nicotine or tobacco, such as cigarettes, e-cigarettes, and chewing tobacco. If you need help quitting, ask your doctor. General instructions  Take over-the-counter and prescription medicines only as told by your doctor.  If you were given a machine to use while you sleep, use it only as told by your doctor.  If you are having surgery, make sure to tell your doctor you have sleep apnea. You may need to bring your device with you.  Keep all follow-up visits as told by your doctor. This is important. Contact a doctor if:  The machine that you were given to use during sleep bothers you or does not seem to be working.  You do not get better.  You get worse. Get help right away if:  Your chest hurts.  You have trouble breathing in enough air.  You have an uncomfortable feeling in your back, arms, or stomach.  You have trouble talking.  One side of your body feels weak.  A part of your face is hanging down. These symptoms may be an emergency. Do not wait to see if the symptoms will go away. Get medical help right away. Call your local emergency services (911 in the [place] Do not drive yourself to the hospital. Summary  This condition affects breathing during sleep.  The most common cause is a collapsed or blocked airway.  The goal of treatment is to help you breathe normally while you sleep.    Arthritis Arthritis means joint pain. It can also mean joint disease. A joint is a place where bones come together. There are more than 100 types of arthritis. What are the causes? This condition may be caused by:  Wear and tear of a joint. This is the most common cause.  A lot of acid in the blood, which leads to pain in the joint (gout).  Pain and swelling (inflammation) in a joint.  Infection of a joint.  Injuries in the joint.  A reaction to medicines  (allergy). In some cases, the cause may not be known. What are the signs or symptoms? Symptoms of this condition include:  Redness at a joint.  Swelling at a joint.  Stiffness at a joint.  Warmth coming from the joint.  A fever.  A feeling of being sick. How is this treated? This condition may be treated with:  Treating the cause, if it is known.  Rest.  Raising (elevating) the joint.  Putting cold or hot packs on the joint.  Medicines to treat symptoms and reduce pain and swelling.  Shots of medicines (cortisone) into the joint. You may also be told to make changes in your life, such as doing exercises and losing weight. Follow these instructions at home: Medicines  Take over-the-counter and prescription medicines only as told by your doctor.  Do not take  aspirin for pain if your doctor says that you may have gout. Activity  Rest your joint if your doctor tells you to.  Avoid activities that make the pain worse.  Exercise your joint regularly as told by your doctor. Try doing exercises like: ? Swimming. ? Water aerobics. ? Biking. ? Walking. Managing pain, stiffness, and swelling      If told, put ice on the affected area. ? Put ice in a plastic bag. ? Place a towel between your skin and the bag. ? Leave the ice on for 20 minutes, 2-3 times per day.  If your joint is swollen, raise (elevate) it above the level of your heart if told by your doctor.  If your joint feels stiff in the morning, try taking a warm shower.  If told, put heat on the affected area. Do this as often as told by your doctor. Use the heat source that your doctor recommends, such as a moist heat pack or a heating pad. If you have diabetes, do not apply heat without asking your doctor. To apply heat: ? Place a towel between your skin and the heat source. ? Leave the heat on for 20-30 minutes. ? Remove the heat if your skin turns bright red. This is very important if you are unable to  feel pain, heat, or cold. You may have a greater risk of getting burned. General instructions  Do not use any products that contain nicotine or tobacco, such as cigarettes, e-cigarettes, and chewing tobacco. If you need help quitting, ask your doctor.  Keep all follow-up visits as told by your doctor. This is important. Contact a doctor if:  The pain gets worse.  You have a fever. Get help right away if:  You have very bad pain in your joint.  You have swelling in your joint.  Your joint is red.  Many joints become painful and swollen.  You have very bad back pain.  Your leg is very weak.  You cannot control your pee (urine) or poop (stool). Summary  Arthritis means joint pain. It can also mean joint disease. A joint is a place where bones come together.  The most common cause of this condition is wear and tear of a joint.  Symptoms of this condition include redness, swelling, or stiffness of the joint.  This condition is treated with rest, raising the joint, medicines, and putting cold or hot packs on the joint.  Follow your doctor's instructions about medicines, activity, exercises, and other home care treatments. This information is not intended to replace advice given to you by your health care provider. Make sure you discuss any questions you have with your health care provider. Document Revised: 12/30/2017 Document Reviewed: 12/30/2017 Elsevier Patient Education  [employer]. .

## 2019-08-14 ENCOUNTER — Telehealth: Payer: Self-pay | Admitting: Family Medicine

## 2019-08-14 DIAGNOSIS — R768 Other specified abnormal immunological findings in serum: Secondary | ICD-10-CM

## 2019-08-14 LAB — CMP14+EGFR
ALT: 18 IU/L (ref 0–32)
AST: 16 IU/L (ref 0–40)
Albumin/Globulin Ratio: 1.2 (ref 1.2–2.2)
Albumin: 4.3 g/dL (ref 3.8–4.8)
Alkaline Phosphatase: 99 IU/L (ref 48–121)
BUN/Creatinine Ratio: 11 (ref 9–23)
BUN: 14 mg/dL (ref 6–24)
Bilirubin Total: 0.2 mg/dL (ref 0.0–1.2)
CO2: 22 mmol/L (ref 20–29)
Calcium: 9.4 mg/dL (ref 8.7–10.2)
Chloride: 105 mmol/L (ref 96–106)
Creatinine, Ser: 1.23 mg/dL — ABNORMAL HIGH (ref 0.57–1.00)
GFR calc Af Amer: 60 mL/min/{1.73_m2} (ref >59)
GFR calc non Af Amer: 52 mL/min/{1.73_m2} — ABNORMAL LOW (ref >59)
Globulin, Total: 3.6 g/dL (ref 1.5–4.5)
Glucose: 112 mg/dL — ABNORMAL HIGH (ref 65–99)
Potassium: 4.5 mmol/L (ref 3.5–5.2)
Sodium: 140 mmol/L (ref 134–144)
Total Protein: 7.9 g/dL (ref 6.0–8.5)

## 2019-08-14 LAB — HEPATITIS C ANTIBODY: Hep C Virus Ab: 6.6 s/co ratio — ABNORMAL HIGH (ref 0.0–0.9)

## 2019-08-17 NOTE — Addendum Note (Signed)
Addended byWendy Poet, Jose Corvin D on: 08/17/2019 12:45 PM   Modules accepted: Orders

## 2019-08-17 NOTE — Telephone Encounter (Signed)
Patient LVM on nurse line reporting she saw her positive Hepatitis C results and would for PCP to call her to go over next steps. Please advise.

## 2019-08-18 ENCOUNTER — Other Ambulatory Visit: Payer: Self-pay

## 2019-08-18 ENCOUNTER — Other Ambulatory Visit: Payer: 59

## 2019-08-18 ENCOUNTER — Encounter: Payer: Self-pay | Admitting: Family Medicine

## 2019-08-18 ENCOUNTER — Ambulatory Visit
Admission: RE | Admit: 2019-08-18 | Discharge: 2019-08-18 | Disposition: A | Discharge status: Home / Self Care | Payer: 59 | Source: Ambulatory Visit | Attending: Family Medicine | Admitting: Family Medicine

## 2019-08-18 DIAGNOSIS — R400 Somnolence: Secondary | ICD-10-CM | POA: Insufficient documentation

## 2019-08-18 DIAGNOSIS — H547 Unspecified visual loss: Secondary | ICD-10-CM

## 2019-08-18 DIAGNOSIS — R7689 Other specified abnormal immunological findings in serum: Secondary | ICD-10-CM

## 2019-08-18 DIAGNOSIS — R1031 Right lower quadrant pain: Secondary | ICD-10-CM

## 2019-08-18 DIAGNOSIS — Z8619 Personal history of other infectious and parasitic diseases: Secondary | ICD-10-CM | POA: Insufficient documentation

## 2019-08-18 DIAGNOSIS — R768 Other specified abnormal immunological findings in serum: Secondary | ICD-10-CM

## 2019-08-18 DIAGNOSIS — Z8669 Personal history of other diseases of the nervous system and sense organs: Secondary | ICD-10-CM | POA: Insufficient documentation

## 2019-08-18 DIAGNOSIS — D75839 Thrombocytosis, unspecified: Secondary | ICD-10-CM

## 2019-08-18 DIAGNOSIS — D649 Anemia, unspecified: Secondary | ICD-10-CM

## 2019-08-18 DIAGNOSIS — K760 Fatty (change of) liver, not elsewhere classified: Secondary | ICD-10-CM | POA: Insufficient documentation

## 2019-08-18 HISTORY — DX: Fatty (change of) liver, not elsewhere classified: K76.0

## 2019-08-18 HISTORY — DX: Unspecified visual loss: H54.7

## 2019-08-18 HISTORY — DX: Personal history of other infectious and parasitic diseases: Z86.19

## 2019-08-18 HISTORY — DX: Thrombocytosis, unspecified: D75.839

## 2019-08-18 IMAGING — CR DG HIP (WITH OR WITHOUT PELVIS) 2-3V*R*
3 series · 3 of 3 positions shown · non-contrast
Comparison: None.

CLINICAL DATA: Pain

EXAM:
DG HIP (WITH OR WITHOUT PELVIS) 2-3V RIGHT

[w pelvis upright]
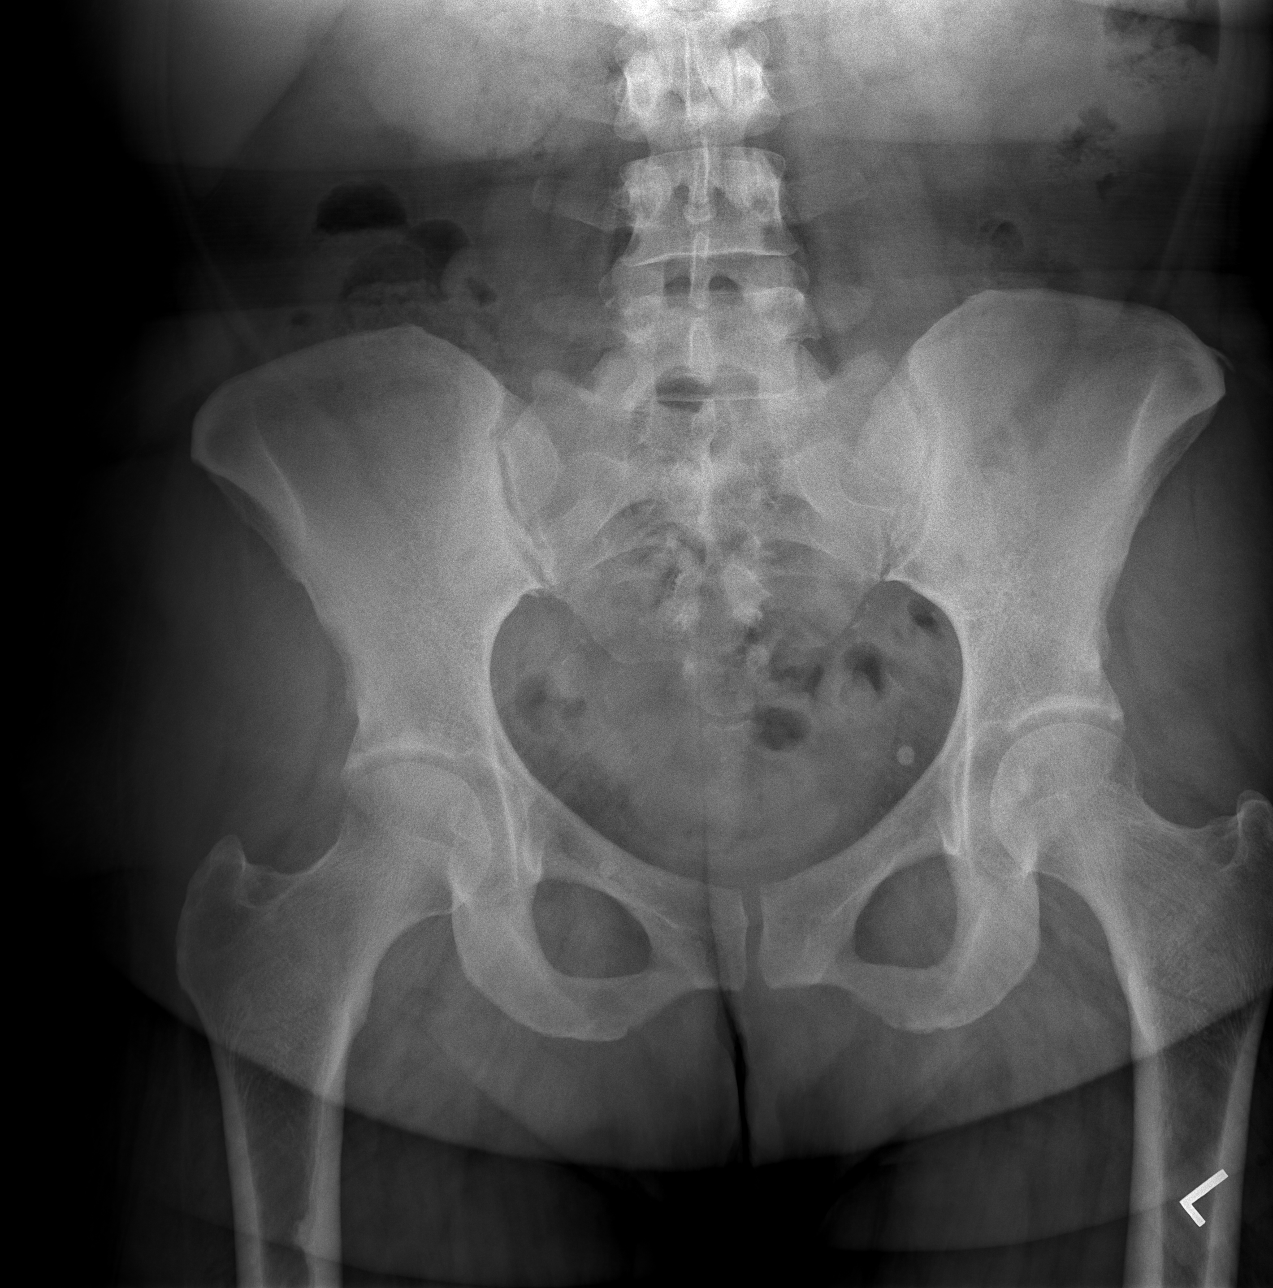

[w hip ap right]
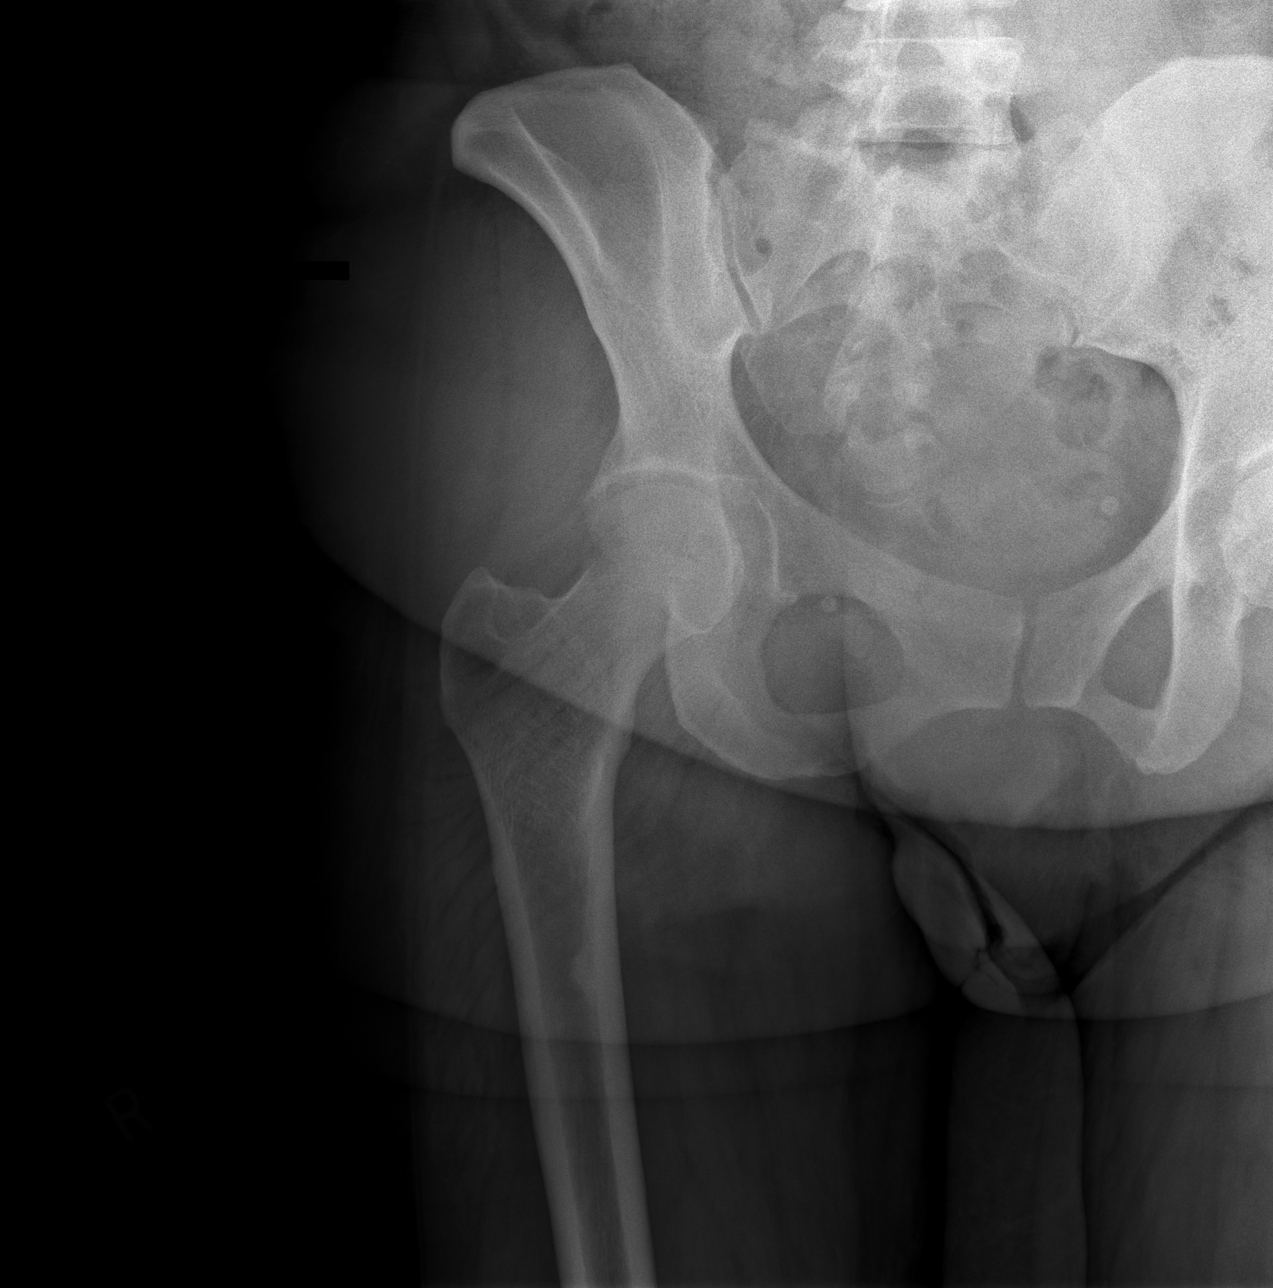

[w hip frog right]
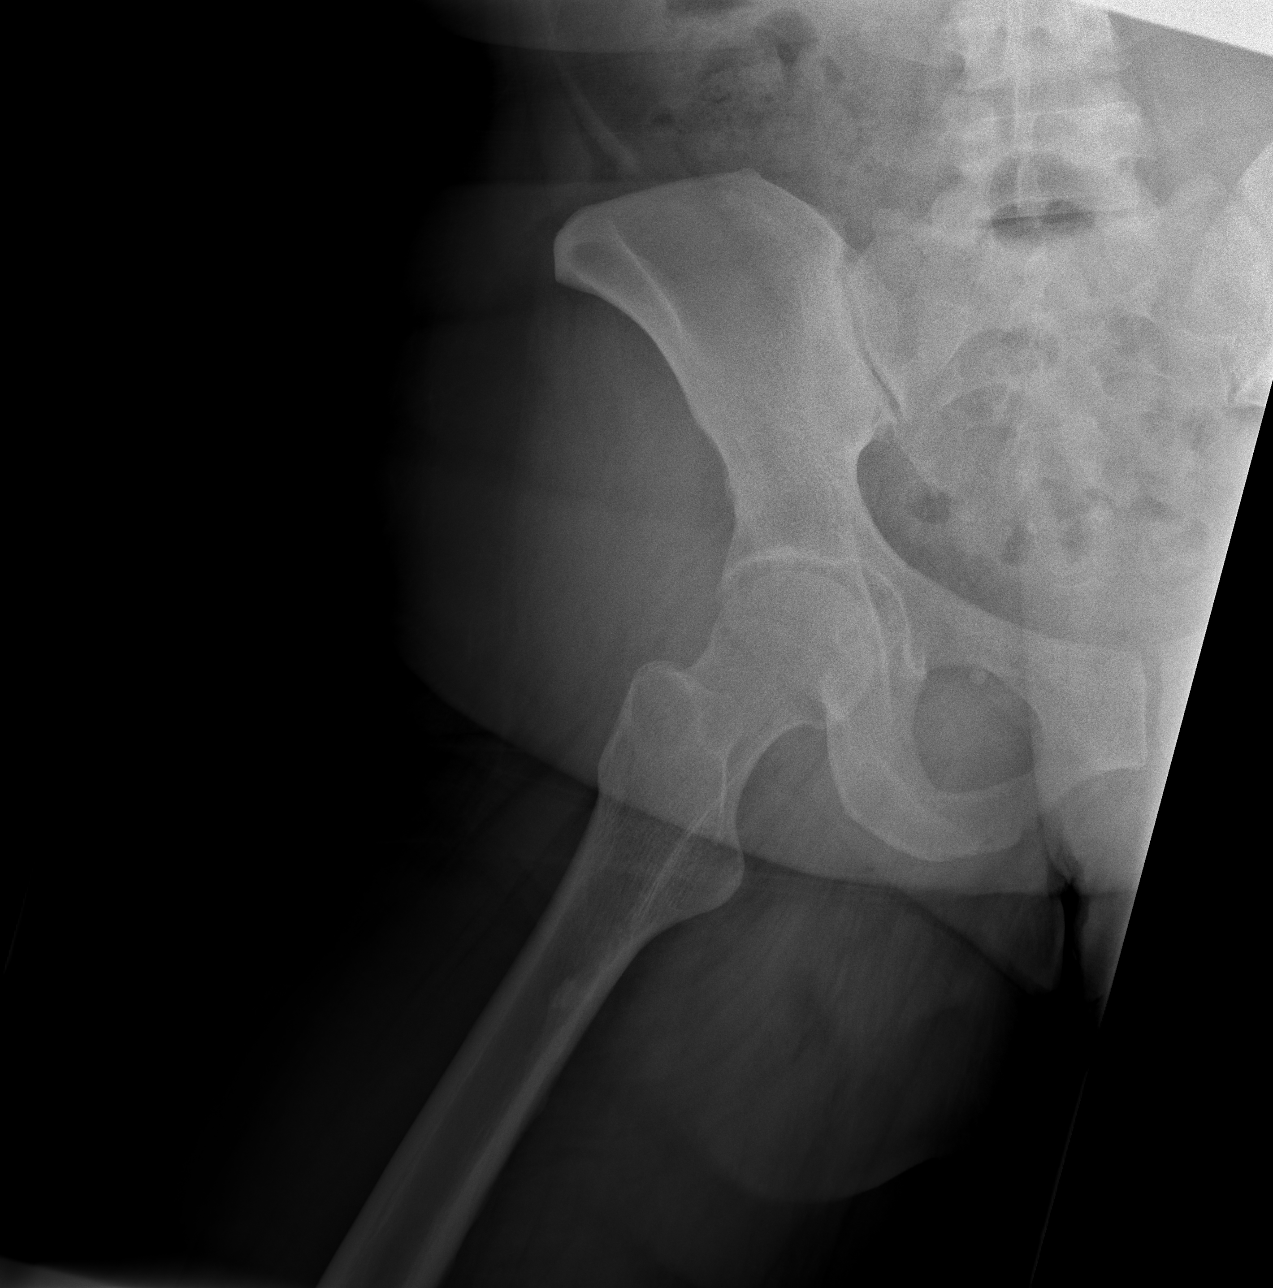

[3 of 3 positions shown; findings below may reference images not displayed]

FINDINGS: Frontal pelvis as well as frontal and lateral right hip images were
obtained. No fracture or dislocation. There is mild symmetric
narrowing of each hip joint. No erosive change. Sacroiliac joints
appear normal bilaterally.
IMPRESSION: Mild narrowing each hip joint.  No fracture or dislocation.

## 2019-08-18 NOTE — Addendum Note (Signed)
Addended byWendy Poet, Trustin Chapa D on: 08/18/2019 11:04 AM   Modules accepted: Orders

## 2019-08-18 NOTE — Assessment & Plan Note (Addendum)
Screening HCV ordered Pap smear deferred by patient request - current menses.

## 2019-08-18 NOTE — Assessment & Plan Note (Signed)
Established problem Controlled Continue current medications and other regiments

## 2019-08-18 NOTE — Assessment & Plan Note (Signed)
Established problem Concern for NASH on 2016 [place]. May need repeat CT. Need to discuss role of weight control in prevention of progression to cirrhosis

## 2019-08-18 NOTE — Telephone Encounter (Signed)
I spoke with Alicia West about her HCV ab positive test result from 08/13/19. We discussed that it could represent a true HCV infection, a false positive result, or a resolved HCV infection.  Alicia West has never used ID drugs of abuse and has not had a accidental needle stick.   Her husband, with whom she has had a long-term, mutually monogamous relationship, is not known to have HCV infection.   We discussed importance of avoiding potential hepatotoxic substances such as alcohol, doses of APAP greater than 2 grams, NSAIDs, etc., while awaiting the RNA testing.  Reviewed preventing blood contact by not sharing toothbrushes, shaving equipment, covering any wounds/cuts, and cleaning surfaces with blood contamination with dilute bleach.   Alicia West has already educated herself at the [employer] site.   Alicia West agrees to come into [place] lab for the HCV RNA testing.  We will also check her for HBV infection and immunity, and HAV immunity status.  Will also check HIV again: last in 2017.

## 2019-08-18 NOTE — Assessment & Plan Note (Signed)
Established problem. Stable. Chornic finding since 2008 in EMR. Always below 600K, so not likely Essential Thrombocythemia Checking iron with anemia panel

## 2019-08-18 NOTE — Assessment & Plan Note (Signed)
Established problem Slowly rising SCr since 2019 Urinalysis last month did not show proteinuria on dipstick.   Need to check Urine spot Albumin:Creatinine ratio next ov.

## 2019-08-18 NOTE — Assessment & Plan Note (Signed)
Will need to discuss at next ov

## 2019-08-18 NOTE — Assessment & Plan Note (Addendum)
New problem  STOP-Bang questionnaire score of 6 including; loud snoring, tiredness during daytime, observed gasping, high blood pressure, BMI > 35%, and neck circumference greater than 40 cm.   Epworth Sleepiness Scale score of 24: with a high chance of nodding off with all listed activities, including in a car.   A/ High risk for Obstructive Sleep Apnea  P/ Referral to Dr Chauncey Cruel. Athar (Neuro-Sleep Medicine) at [facility] for evaluation and treatment

## 2019-08-18 NOTE — Progress Notes (Signed)
Alicia West is alone Sources of clinical information for visit is/are patient and past medical records. Nursing assessment for this office visit was reviewed with the patient for accuracy and revision.     Previous Report(s) Reviewed: historical medical records, lab reports, office notes and radiology reports  Depression screen [facility] 2/9 08/13/2019  Decreased Interest 0  Down, Depressed, Hopeless 0  PHQ - 2 Score 0  Altered sleeping -  Tired, decreased energy -  Change in appetite -  Feeling bad or failure about yourself  -  Trouble concentrating -  Moving slowly or fidgety/restless -  Suicidal thoughts -  PHQ-9 Score -  Some recent data might be hidden    Fall Risk  08/13/2019 01/28/2019 04/16/2018 01/31/2018 01/07/2018  Falls in the past year? 0 0 0 0 0  Number falls in past yr: 0 0 0 - 0  Injury with Fall? - - 0 - 0  Follow up Falls evaluation completed - Falls evaluation completed - -    PHQ9 SCORE ONLY 08/13/2019 07/29/2019 01/28/2019  PHQ-9 Total Score 0 19 3    Adult vaccines due  Topic Date Due  . TETANUS/TDAP  05/29/2021  . PNEUMOCOCCAL POLYSACCHARIDE VACCINE AGE 49-64 HIGH RISK  Completed    Health Maintenance Due  Topic Date Due  . COVID-19 Vaccine (1) Never done  . PAP SMEAR-Modifier  05/26/2016      History/P.E. limitations: none  Adult vaccines due  Topic Date Due  . TETANUS/TDAP  05/29/2021  . PNEUMOCOCCAL POLYSACCHARIDE VACCINE AGE 49-64 HIGH RISK  Completed    Diabetes Health Maintenance Due  Topic Date Due  . FOOT EXAM  10/16/2019  . HEMOGLOBIN A1C  01/28/2020  . OPHTHALMOLOGY EXAM  03/02/2020    Health Maintenance Due  Topic Date Due  . COVID-19 Vaccine (1) Never done  . PAP SMEAR-Modifier  05/26/2016     Chief Complaint  Patient presents with  . Dental Clearance  . Fatigue    FATIGUE  Onset: over a year ago  Fatigue with exertion: yes  Physical limitations: effortful to leave out of house Primarily motivational fatigue: no Primarily  physical fatigue: yes  Symptoms Fever: no  Weight loss: no   Exertional chest pain: no  Dyspnea: yes, exertional  Cough: no   Severe snoring: yes  Daytime sleepiness: yes  Feeling depressed: no  Anhedonia: no  Poor sleep: yes   Diabetes Mellitus Type Two  Disease Monitoring  Blood Sugar Ranges: not recorded  Visual problems: yes   Recent Medication Changes:  no   Medication Regiment Adherence: yes, Metformin 500 mg daily (intolerant of high daily dosage); Basaglar 30 units Graf daily;  Semaglutide Bright weekly - managed by Dr Valentina Lucks                                                        Medication Side Effects  Hypoglycemia: no   Recent Physical Illness: no, no acute illness   Emotional Stressors: Other -  Level of Activity: work;   [place]                          Taking Prescribed Statin: no             Taking Prescribed ACEI/ARB: yes  Last Eye Exam: Retinal specialist             Last Nutrition Consult: not recorded  CHRONIC HYPERTENSION  Disease Monitoring  Checking Blood pressure at home: not recorded  Chest pain: no   Dyspnea: yes, exertional   Claudication: not recorded   Medication compliance: yes, Hyzaar 100/25 1 daily; spironolactone 25 mg daily; carvedilol 25 mg BID   Predental Clearance - 4 teeth extraction, bone implant for later posts,

## 2019-08-20 LAB — ANEMIA PANEL
Ferritin: 21 ng/mL (ref 15–150)
Folate, Hemolysate: 591 ng/mL
Folate, RBC: 1970 ng/mL (ref >498)
Hematocrit: 30 % — ABNORMAL LOW (ref 34.0–46.6)
Iron Saturation: 11 % — ABNORMAL LOW (ref 15–55)
Iron: 42 ug/dL (ref 27–159)
Retic Ct Pct: 1.1 % (ref 0.6–2.6)
Total Iron Binding Capacity: 367 ug/dL (ref 250–450)
UIBC: 325 ug/dL (ref 131–425)
Vitamin B-12: 640 pg/mL (ref 232–1245)

## 2019-08-20 LAB — HEPATITIS B SURFACE ANTIBODY,QUALITATIVE: Hep B Surface Ab, Qual: REACTIVE

## 2019-08-20 LAB — HEPATITIS B SURFACE ANTIGEN: Hepatitis B Surface Ag: NEGATIVE

## 2019-08-20 LAB — HIV ANTIBODY (ROUTINE TESTING W REFLEX): HIV Screen 4th Generation wRfx: NONREACTIVE

## 2019-08-20 LAB — HCV RNA QUANT: Hepatitis C Quantitation: NOT DETECTED IU/mL

## 2019-08-20 LAB — HEPATITIS A ANTIBODY, IGM: Hep A IgM: NEGATIVE

## 2019-09-02 ENCOUNTER — Telehealth: Payer: Self-pay | Admitting: Pharmacist

## 2019-09-02 NOTE — Telephone Encounter (Signed)
Patient returned call after I tried to contact her by phone on 08/31/2019 RE her diabetes control.   She reports that her blood sugar control is greatly improved.  She reported a single sugar of 85 which she shared did NOT cause any low blood glucose symptoms.  She denies any side effects.  She requested sample support at her PCP visit on 7/289 with Dr. Wendy Poet.    I shared that we have samples of Ozempic and we could help with supply.  I shared that I would let the nurse know to help her at the visit in the AM.   I would be happy to say hello tomorrow if available.  I will be interested to see change in weight (she reported that she had not weighed herself lately) given that her clothes are fitting better.

## 2019-09-03 ENCOUNTER — Ambulatory Visit (INDEPENDENT_AMBULATORY_CARE_PROVIDER_SITE_OTHER): Payer: 59 | Admitting: Family Medicine

## 2019-09-03 ENCOUNTER — Other Ambulatory Visit (HOSPITAL_COMMUNITY)
Admission: RE | Admit: 2019-09-03 | Discharge: 2019-09-03 | Disposition: A | Discharge status: Home / Self Care | Payer: 59 | Source: Ambulatory Visit | Attending: Family Medicine | Admitting: Family Medicine

## 2019-09-03 ENCOUNTER — Encounter: Payer: Self-pay | Admitting: Family Medicine

## 2019-09-03 ENCOUNTER — Other Ambulatory Visit: Payer: Self-pay

## 2019-09-03 VITALS — BP 124/80 | HR 100 | Ht 69.0 in | Wt 242.0 lb

## 2019-09-03 DIAGNOSIS — R93429 Abnormal radiologic findings on diagnostic imaging of unspecified kidney: Secondary | ICD-10-CM | POA: Diagnosis not present

## 2019-09-03 DIAGNOSIS — Z124 Encounter for screening for malignant neoplasm of cervix: Secondary | ICD-10-CM

## 2019-09-03 DIAGNOSIS — E1149 Type 2 diabetes mellitus with other diabetic neurological complication: Secondary | ICD-10-CM

## 2019-09-03 DIAGNOSIS — M869 Osteomyelitis, unspecified: Secondary | ICD-10-CM

## 2019-09-03 DIAGNOSIS — K760 Fatty (change of) liver, not elsewhere classified: Secondary | ICD-10-CM | POA: Diagnosis not present

## 2019-09-03 DIAGNOSIS — E1169 Type 2 diabetes mellitus with other specified complication: Secondary | ICD-10-CM

## 2019-09-03 DIAGNOSIS — R1900 Intra-abdominal and pelvic swelling, mass and lump, unspecified site: Secondary | ICD-10-CM

## 2019-09-03 DIAGNOSIS — E7800 Pure hypercholesterolemia, unspecified: Secondary | ICD-10-CM

## 2019-09-03 DIAGNOSIS — N951 Menopausal and female climacteric states: Secondary | ICD-10-CM

## 2019-09-03 DIAGNOSIS — D75839 Thrombocytosis, unspecified: Secondary | ICD-10-CM

## 2019-09-03 DIAGNOSIS — K7581 Nonalcoholic steatohepatitis (NASH): Secondary | ICD-10-CM

## 2019-09-03 DIAGNOSIS — D473 Essential (hemorrhagic) thrombocythemia: Secondary | ICD-10-CM

## 2019-09-03 DIAGNOSIS — E1165 Type 2 diabetes mellitus with hyperglycemia: Secondary | ICD-10-CM

## 2019-09-03 DIAGNOSIS — Z1211 Encounter for screening for malignant neoplasm of colon: Secondary | ICD-10-CM

## 2019-09-03 DIAGNOSIS — R400 Somnolence: Secondary | ICD-10-CM

## 2019-09-03 DIAGNOSIS — G8929 Other chronic pain: Secondary | ICD-10-CM

## 2019-09-03 DIAGNOSIS — Z0000 Encounter for general adult medical examination without abnormal findings: Secondary | ICD-10-CM

## 2019-09-03 DIAGNOSIS — D509 Iron deficiency anemia, unspecified: Secondary | ICD-10-CM

## 2019-09-03 DIAGNOSIS — M1611 Unilateral primary osteoarthritis, right hip: Secondary | ICD-10-CM

## 2019-09-03 DIAGNOSIS — R1031 Right lower quadrant pain: Secondary | ICD-10-CM

## 2019-09-03 HISTORY — DX: Menopausal and female climacteric states: N95.1

## 2019-09-03 MED ORDER — COMPLETENATE 29-1 MG PO CHEW: 1.0000 | CHEWABLE_TABLET | Freq: Every day | ORAL | 3 refills | Status: DC

## 2019-09-03 NOTE — Assessment & Plan Note (Signed)
Established problem Given ferritin relatively low at 21, recommend start of iron supplementation to her diet.  She reports intolerance to iron tablets in past.   Alicia West is will to try Prenatal vitamins with iron, which tend to be more tolerable.  Will recheck CBC after 3 months of supplementation.

## 2019-09-03 NOTE — Assessment & Plan Note (Signed)
Established problem that has improved.

## 2019-09-03 NOTE — Assessment & Plan Note (Signed)
Established problem Asymptomatic Alicia West is willing to go for an abdominal US to assess if the hepatic steatosis seen on  A 2016 Abdominal US still persists.  If present, may need to obtain elastography.  Encouraged Alicia West work at changing her eating habits by participating in an organized nutritional program "Drink to [employer]".  The drink contents appear to be safe herbals indlucing Myrrh and thyme.

## 2019-09-03 NOTE — Assessment & Plan Note (Addendum)
Intolerant of atorvastatin. Muscle aches in thighs.   Will discuss lower dose atorvastatin with less frequent use at next ov./

## 2019-09-03 NOTE — Assessment & Plan Note (Signed)
Given USPSTF 2021 (Grade B) recommendations for CRC screening starting at 63 to 68, referral to Dr Havery Moros (GI) for consideration of colonoscopic CRC screening.

## 2019-09-03 NOTE — Assessment & Plan Note (Signed)
Established problem Dr Guadelupe Sabin office has contacted Alicia West. She will contact them to set up her initial consultation with Dr Rexene Alberts to discuss her sleep difficulties that include snoring, gasping, daytime somnolence with short sleep latency.

## 2019-09-03 NOTE — Patient Instructions (Signed)
Please go for the Ultrasound imaging of your pelvis and abdomen to look at your fibroids and your liver.  We have sent a referral to Dr Havery Moros at [facility] to have your screening colon cancer colonoscopy.   We sent a referral to Physical Therapy for you to work on your groin pain that I think is coming from your right hip osteoarthritis and from arthritis of the pubic symphysis (the joint in the low part of your abdomen where the two pelvic bones that make up the pelvis come together.)  A prescription was sent to you mail order pharmacy service ([facility]) for the Prenatal vitamins.  It looks like the cost of these vitamins will be much less if your get the Prenatal vitamin thru [facility] (42 cents a tablet)  Decrease your Insulin to 28 units a day.  Insulin makes people gain weight, so decreasing the insulin helps people lose weight.  The more we can reduce your insulin, the more weight you may lose.

## 2019-09-03 NOTE — Assessment & Plan Note (Signed)
Established problem We discussed the role of medications in helping ameliorate the vasomotor symptoms should she find them intolerable.

## 2019-09-03 NOTE — Telephone Encounter (Signed)
Patient seen in clinic today and samples given to patient. Dawne Casali,CMA

## 2019-09-03 NOTE — Assessment & Plan Note (Signed)
Established problem that has improved.  Patient reports lower home CBGs Patient received samples on semaglutide from Dr Valentina Lucks during visit.   In order to assist with patient's desire for weight loss, I agree with Dr Graylin Shiver recommendation to decrease insulin to 28 units from 30 units, with hopes of continued reduction if pt will accept introduction of canagliflozin to her regiment Continue current medication regiment.  RTC 3 months for A1c

## 2019-09-03 NOTE — Progress Notes (Signed)
Alicia West is alone Sources of clinical information for visit is/are patient and past medical records. Nursing assessment for this office visit was reviewed with the patient for accuracy and revision.     Previous Report(s) Reviewed: historical medical records, lab reports and office notes  Depression screen [facility] 2/9 08/13/2019  Decreased Interest 0  Down, Depressed, Hopeless 0  PHQ - 2 Score 0  Altered sleeping -  Tired, decreased energy -  Change in appetite -  Feeling bad or failure about yourself  -  Trouble concentrating -  Moving slowly or fidgety/restless -  Suicidal thoughts -  PHQ-9 Score -  Some recent data might be hidden    Fall Risk  08/13/2019 01/28/2019 04/16/2018 01/31/2018 01/07/2018  Falls in the past year? 0 0 0 0 0  Number falls in past yr: 0 0 0 - 0  Injury with Fall? - - 0 - 0  Follow up Falls evaluation completed - Falls evaluation completed - -    PHQ9 SCORE ONLY 08/13/2019 07/29/2019 01/28/2019  PHQ-9 Total Score 0 19 3    Adult vaccines due  Topic Date Due  . TETANUS/TDAP  05/29/2021  . PNEUMOCOCCAL POLYSACCHARIDE VACCINE AGE 62-64 HIGH RISK  Completed    Health Maintenance Due  Topic Date Due  . PAP SMEAR-Modifier  05/26/2016    Prior Pap smears 2015 - neg, No high risk HPV 2012 - neg  2009- neg 2007 - neg 2005 - neg 2003 - neg   History/P.E. limitations: none  Adult vaccines due  Topic Date Due  . TETANUS/TDAP  05/29/2021  . PNEUMOCOCCAL POLYSACCHARIDE VACCINE AGE 62-64 HIGH RISK  Completed    Diabetes Health Maintenance Due  Topic Date Due  . HEMOGLOBIN A1C  01/28/2020  . OPHTHALMOLOGY EXAM  03/02/2020  . FOOT EXAM  09/02/2020    Health Maintenance Due  Topic Date Due  . PAP SMEAR-Modifier  05/26/2016     Chief Complaint  Patient presents with  . Gynecologic Exam    Well Woman Visit:   Women of reproductive age and in the menopausal transition Date of last menstrual period (LMP) (first day of bleeding or spotting):  July 7th 2021 Regularity (cycle pattern over past year): yes  (+) intermittent vasomotor symptoms last couple years  Obstetrical history [id number]  Sexual Function Mutually monogamous heterosexual marriage   Genital Infections Current symptoms or history of pelvic, vaginal, or vulvar infections Vaginal discharge: no  Cervical Dysplasia or Cancer History Prior Pap smears 2015 - neg, No high risk HPV 2012 - neg  2009- neg 2007 - neg 2005 - neg 2003 - neg   History of other gynecologic problems Ovarian cysts: no, TVUS 2016 Uterine fibroids: yes, Uterus TVUS 2016  Measurements: 9.8 x 6.5 x 7.8 cm. Retroverted. 2.8 x 3.5 x 3.6 cm subserosal anterior uterine body fibroid. 2.6 x 2.7 x 3.3 cm subserosal posterior fundal fibroid. 2.0 x 1.7 x 1.7 cm pedunculated left posterior fundal fibroid.  Symptoms of pelvic organ prolapse or urinary or anal incontinence Sensation of pelvic or vaginal  bulging or pressure: yes, occasional  History of gynecologic procedures  Examples: patient reports a colposcopy, but I am unable to find a reference in [facility] search.    Physical Exam Genitalia:  Normal introitus for age, no external lesions, no vaginal discharge, mucosa pink and moist, no vaginal or cervical lesions, no vaginal atrophy, no friaility or hemorrhage, uterine cervic positioned anteriorly in vaginal vault, normal in appearance, Uterus uneven contour  to palpation, slightly enlarged compared to normal,  no adnexal masses or tenderness

## 2019-09-03 NOTE — Assessment & Plan Note (Signed)
Established problem Likely from menses, but GI source is possible at her age.   Given ferritin relatively low at 20, recommend start of iron supplementation to her diet.  She reports intolerance to iron tablets in past.   Alicia West is will to try Prenatal vitamins with iron, which tend to be more tolerable.  Will recheck CBC after 3 months of supplementation.

## 2019-09-03 NOTE — Assessment & Plan Note (Signed)
Established problem Working explanation is pain origins from osteoarthritic right hip joint (seen on recent right hip Xray), and region of  pubic symphysis which is tender on palpation.    Uncontrolled  Ms Conkey agreed to consulting with physical therapy to help ameliorate the groin pain.  NSAIDs should be used judiciously bc of there SCr in 1.2 - 1.3 range.

## 2019-09-06 LAB — MICROALBUMIN / CREATININE URINE RATIO
Creatinine, Urine: 254.5 mg/dL
Microalb/Creat Ratio: 9 mg/g creat (ref 0–29)
Microalbumin, Urine: 22.5 ug/mL

## 2019-09-07 ENCOUNTER — Other Ambulatory Visit: Payer: Self-pay

## 2019-09-07 ENCOUNTER — Ambulatory Visit
Admission: RE | Admit: 2019-09-07 | Discharge: 2019-09-07 | Disposition: A | Discharge status: Home / Self Care | Payer: 59 | Source: Ambulatory Visit | Attending: Family Medicine | Admitting: Family Medicine

## 2019-09-07 ENCOUNTER — Telehealth: Payer: Self-pay | Admitting: Family Medicine

## 2019-09-07 ENCOUNTER — Telehealth: Payer: Self-pay | Admitting: *Deleted

## 2019-09-07 DIAGNOSIS — R93429 Abnormal radiologic findings on diagnostic imaging of unspecified kidney: Secondary | ICD-10-CM

## 2019-09-07 DIAGNOSIS — D473 Essential (hemorrhagic) thrombocythemia: Secondary | ICD-10-CM

## 2019-09-07 DIAGNOSIS — K7581 Nonalcoholic steatohepatitis (NASH): Secondary | ICD-10-CM

## 2019-09-07 DIAGNOSIS — D75839 Thrombocytosis, unspecified: Secondary | ICD-10-CM

## 2019-09-07 DIAGNOSIS — A599 Trichomoniasis, unspecified: Secondary | ICD-10-CM

## 2019-09-07 DIAGNOSIS — R1900 Intra-abdominal and pelvic swelling, mass and lump, unspecified site: Secondary | ICD-10-CM

## 2019-09-07 DIAGNOSIS — D509 Iron deficiency anemia, unspecified: Secondary | ICD-10-CM

## 2019-09-07 LAB — CYTOLOGY - PAP
Comment: NEGATIVE
Diagnosis: NEGATIVE
High risk HPV: NEGATIVE

## 2019-09-07 IMAGING — US US ABDOMEN COMPLETE
1 series · 13 of 25 positions shown · non-contrast
Comparison: [DATE]

CLINICAL DATA: Possible NASH an abnormal LEFT renal contour on
[DATE] exam

EXAM:
ABDOMEN ULTRASOUND COMPLETE

[Series 1: us abdomen complete · 0.23mm/px · 13 of 106 slices shown]
[im 1/106]
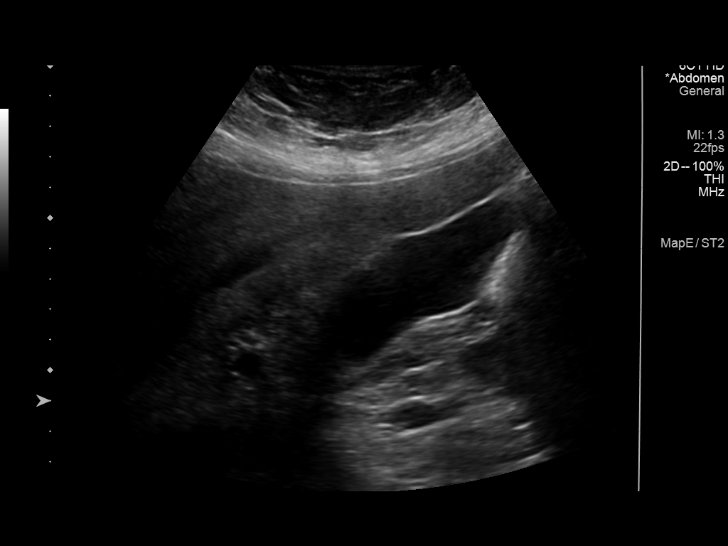
[im 9/106]
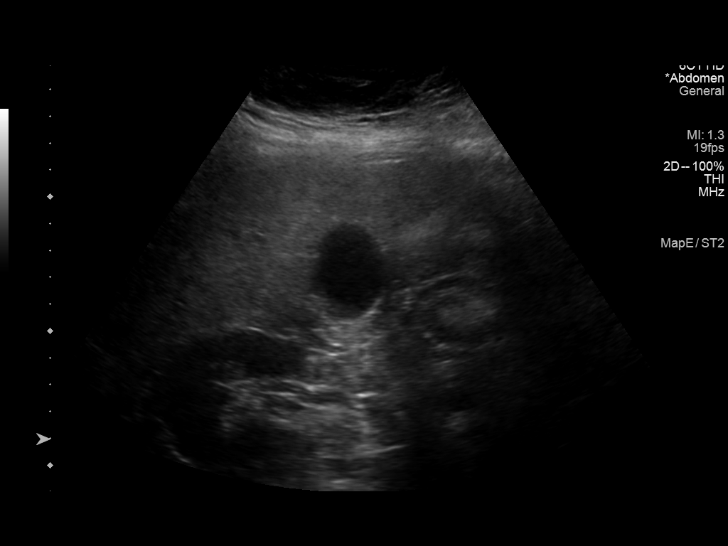
[im 18/106]
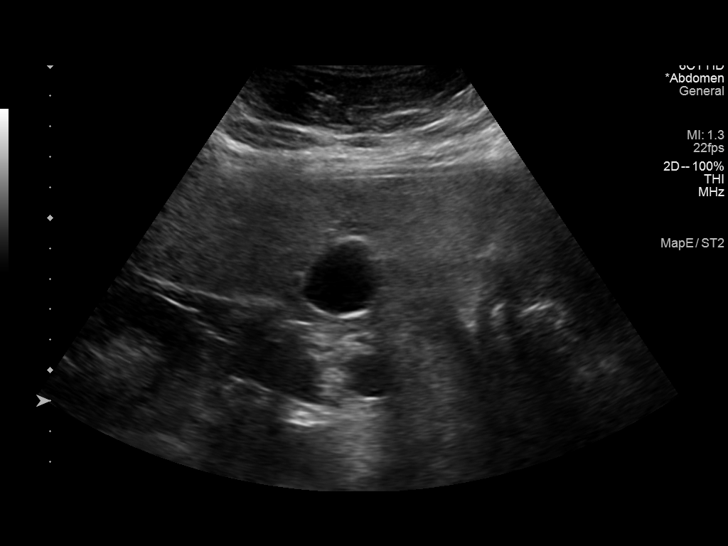
[im 27/106]
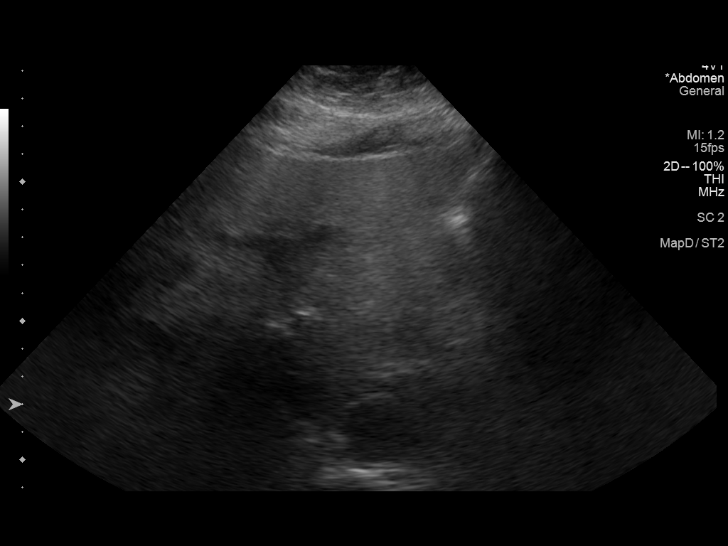
[im 36/106]
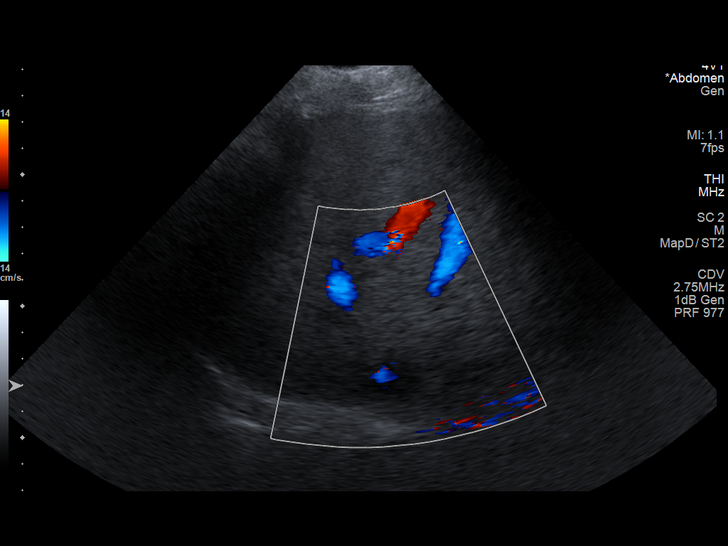
[im 44/106]
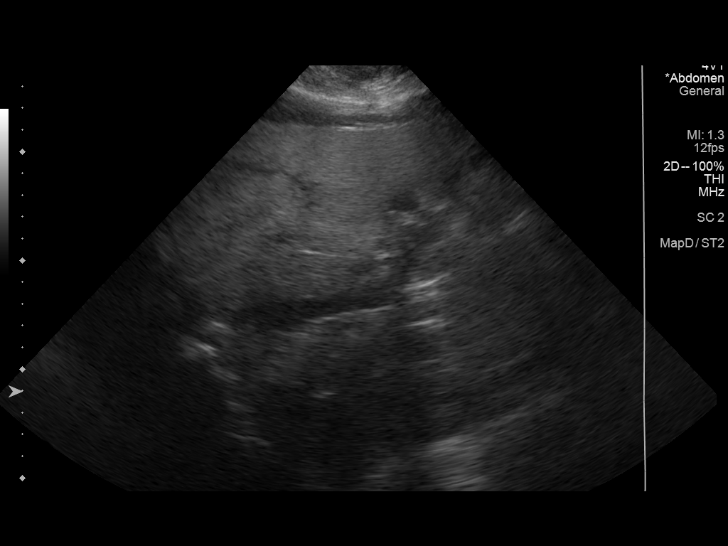
[im 53/106]
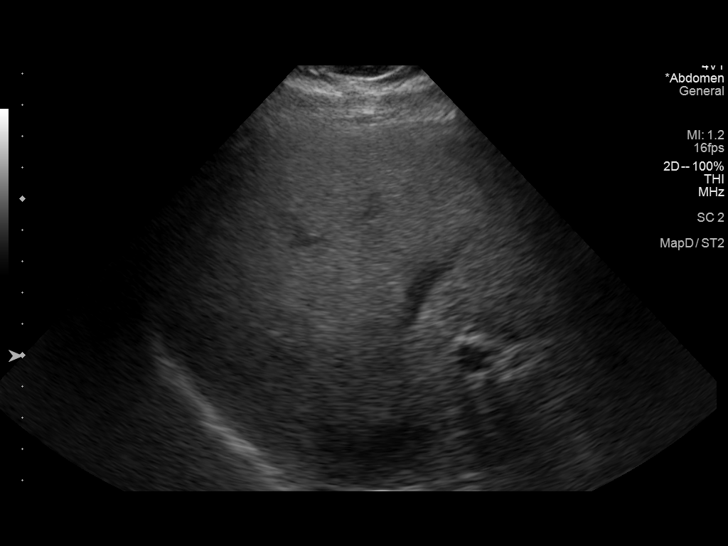
[im 62/106]
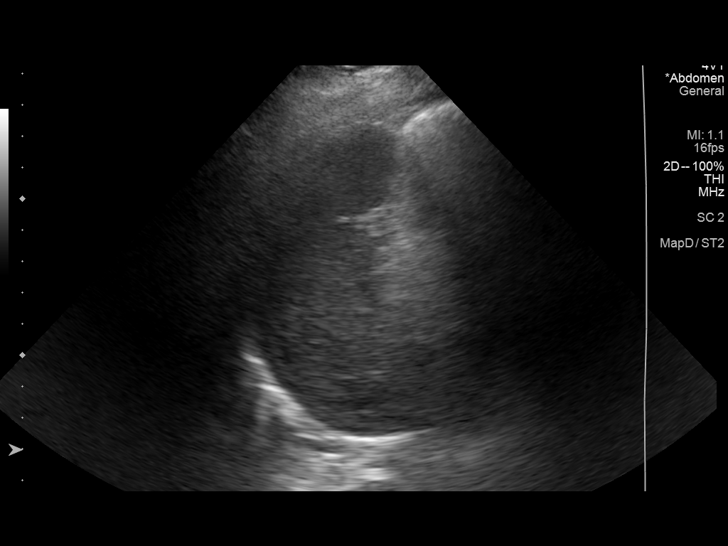
[im 71/106]
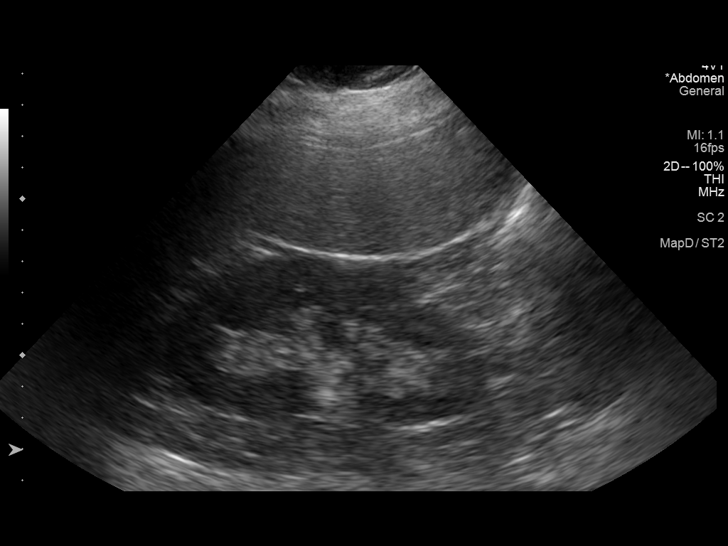
[im 79/106]
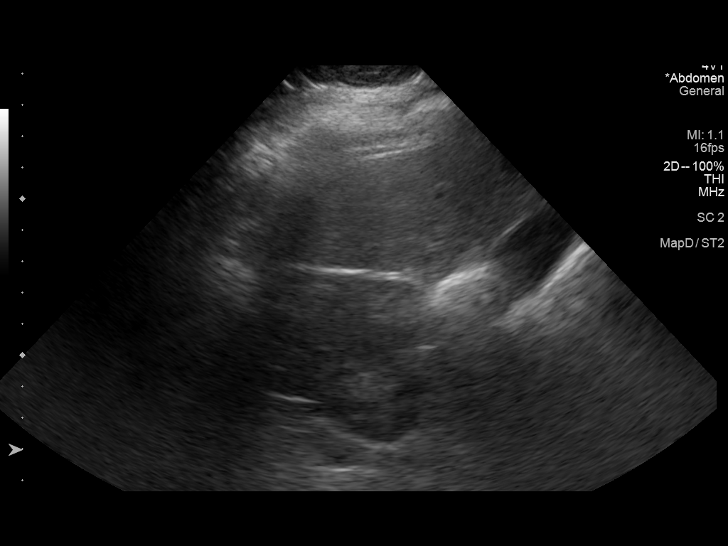
[im 88/106]
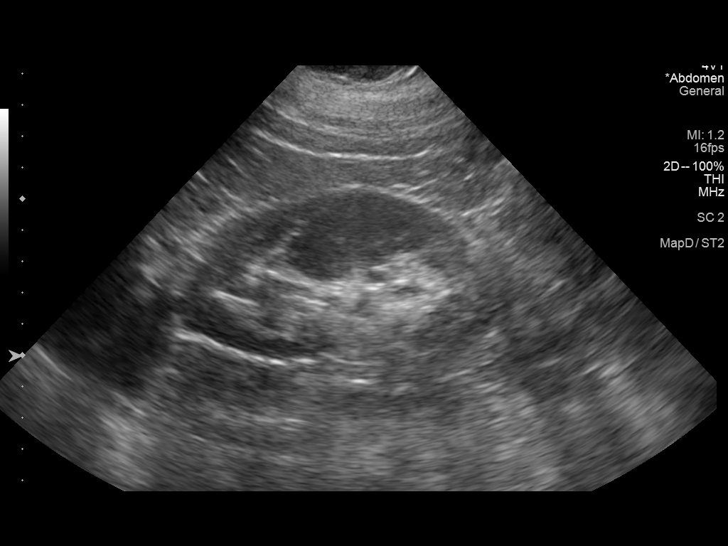
[im 97/106]
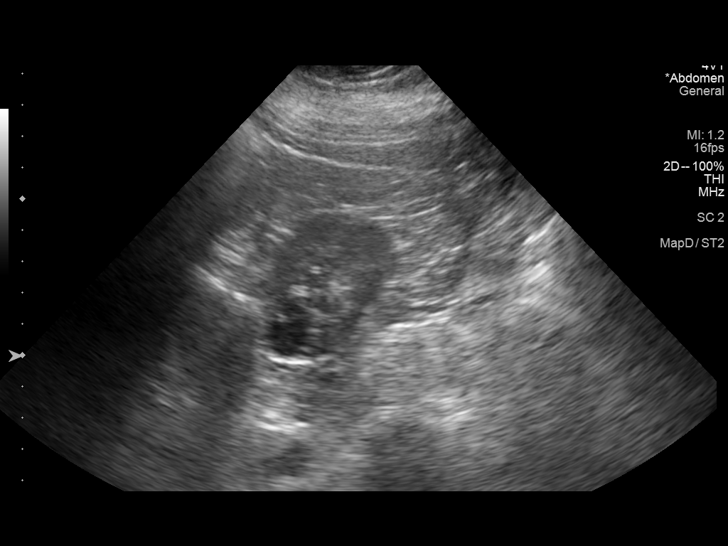
[im 106/106]
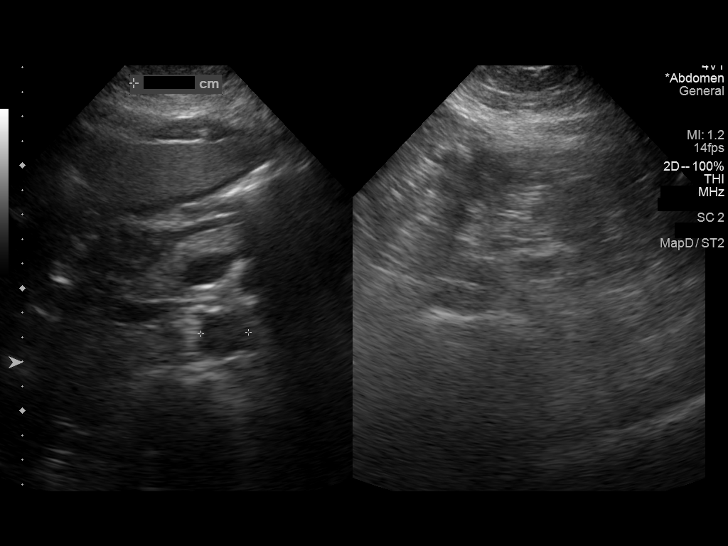

[13 of 25 positions shown; findings below may reference images not displayed]

FINDINGS: Gallbladder: Normally distended without stones or wall thickening.
No pericholecystic fluid or sonographic Murphy sign.

Common bile duct: Diameter: 3 mm, normal

Liver: Echogenic parenchyma, likely fatty infiltration though this
can be seen with cirrhosis and certain infiltrative disorders. No
focal hepatic mass or nodularity portal vein is patent on color
Doppler imaging with normal direction of blood flow towards the
liver.

IVC: Normal appearance

Pancreas: Normal appearance

Spleen: Normal appearance, 4.0 cm length

Right Kidney: Length: 11.8 cm.  Insert spleen

Left Kidney: Length: 10.4 cm. Minimal cortical thinning. Normal
cortical echogenicity. Complicated cystic lesion at inferior pole
2.2 x 2.2 x 2.0 cm, containing internal echoes. No hydronephrosis.
Prominent cortical thickness at mid LEFT kidney, unchanged, pattern
favoring prominent pyramid or dromedary hump over mass. No shadowing
calculi.

Abdominal aorta: Normal caliber

Other findings: No free fluid
IMPRESSION: Probable fatty infiltration of liver as above.

Persistent visualization of an area of thickened cortex at the mid
LEFT kidney, unchanged, favoring trauma DON LOLITO hump or prominent
pyramid.

However, a new 2.2 x 2.2 x 2.0 cm diameter complicated cystic lesion
is seen at the inferior pole the LEFT kidney; characterization of
this lesion by MR imaging with and without contrast recommended to
exclude cystic renal neoplasm.

These results will be called to the ordering clinician or
representative by the Radiologist Assistant, and communication
documented in the PACS or [REDACTED].

## 2019-09-07 IMAGING — US US PELVIS COMPLETE
1 series · 13 of 25 positions shown · non-contrast
Comparison: [DATE]

CLINICAL DATA: Pelvic mass in a female, pelvic pain, possible
painful fibroids; LMP [DATE]

EXAM:
TRANSABDOMINAL ULTRASOUND OF PELVIS
TECHNIQUE: Transabdominal ultrasound examination of the pelvis was performed
including evaluation of the uterus, ovaries, adnexal regions, and
pelvic cul-de-sac. Only transabdominal imaging was ordered; patient
is unable to tolerate transvaginal imaging.

[Series 1: us pelvis complete · 0.30mm/px · 13 of 41 slices shown]
[im 1/41]
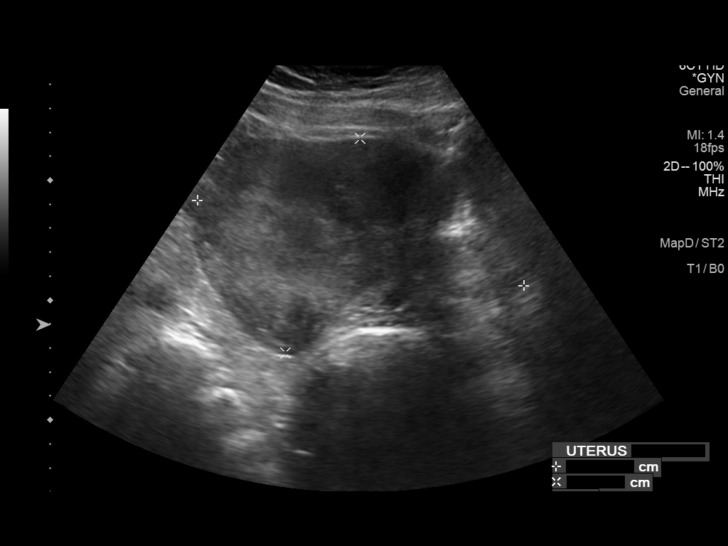
[im 4/41]
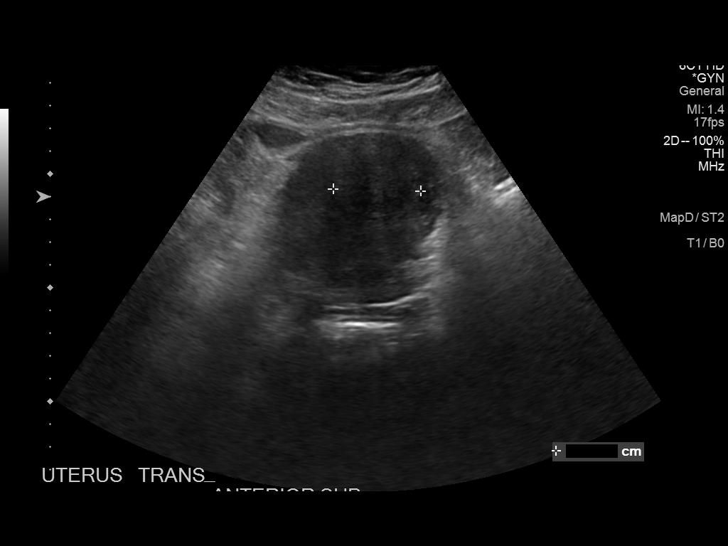
[im 7/41]
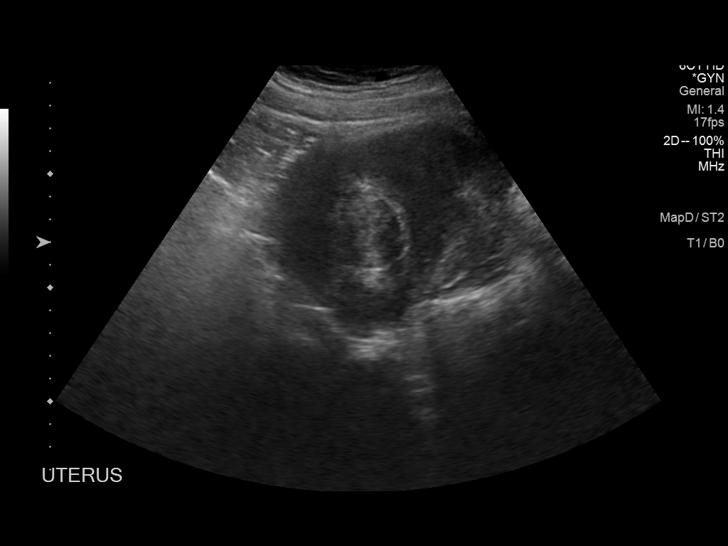
[im 11/41]
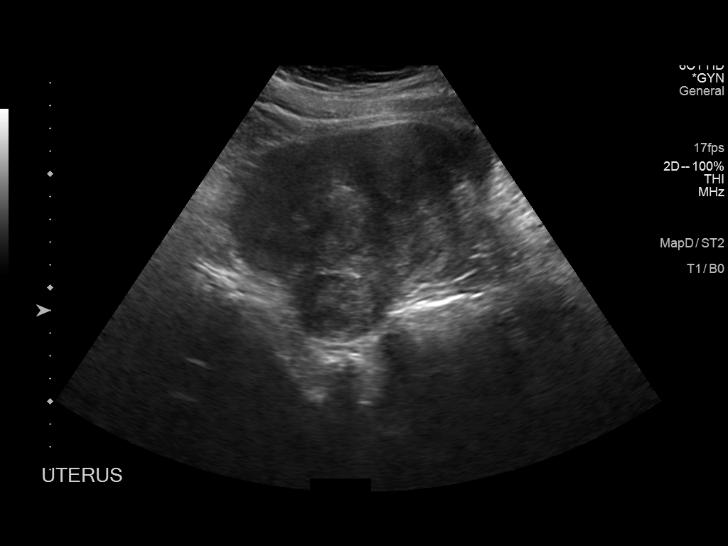
[im 14/41]
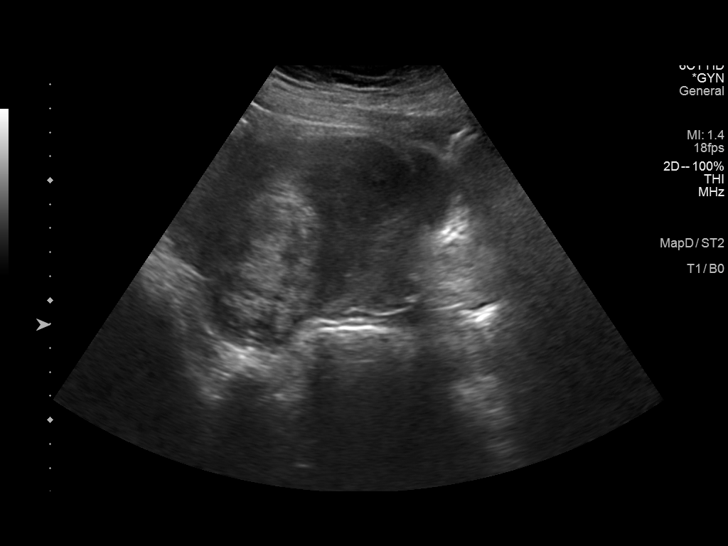
[im 17/41]
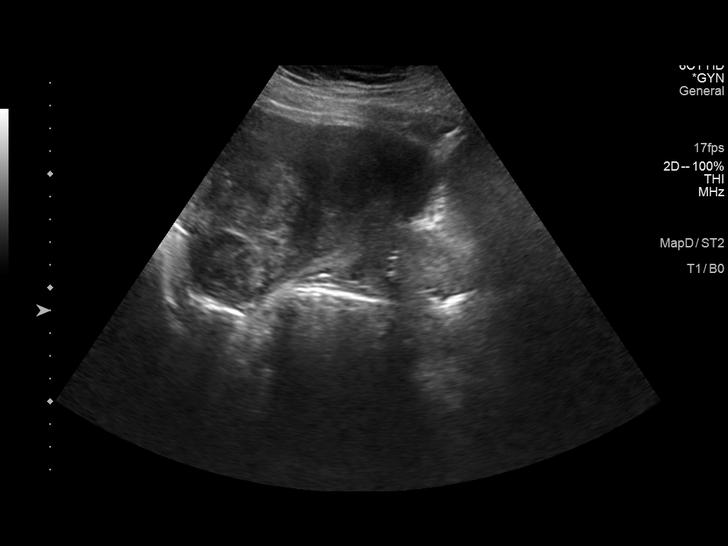
[im 21/41]
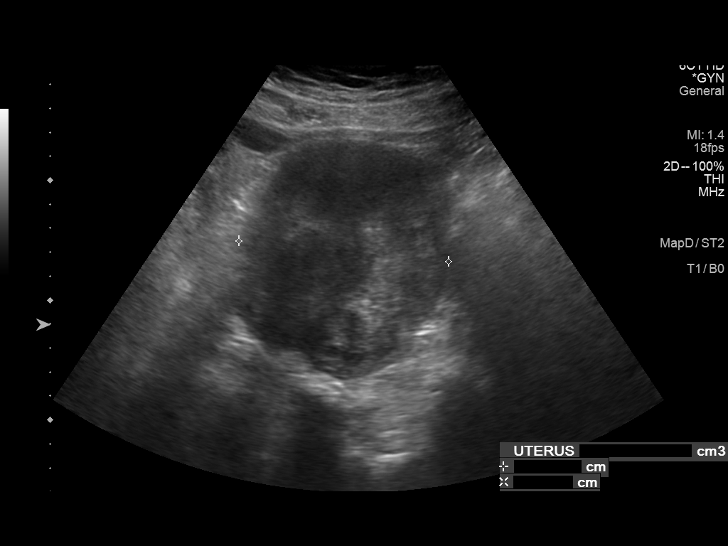
[im 24/41]
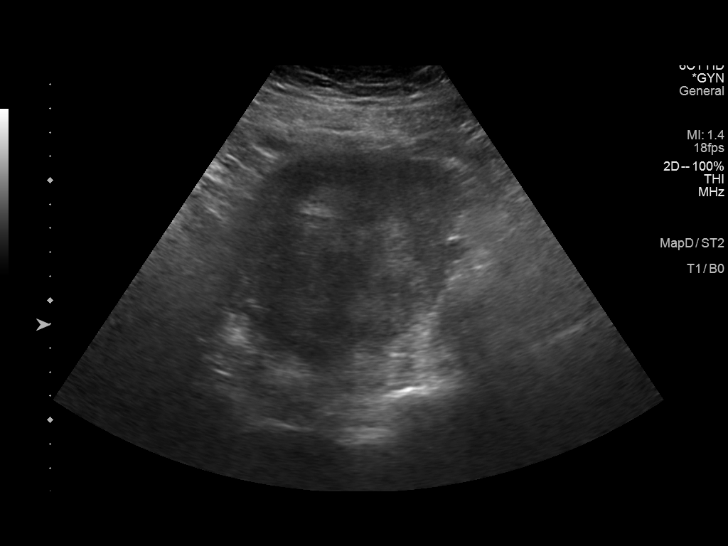
[im 27/41]
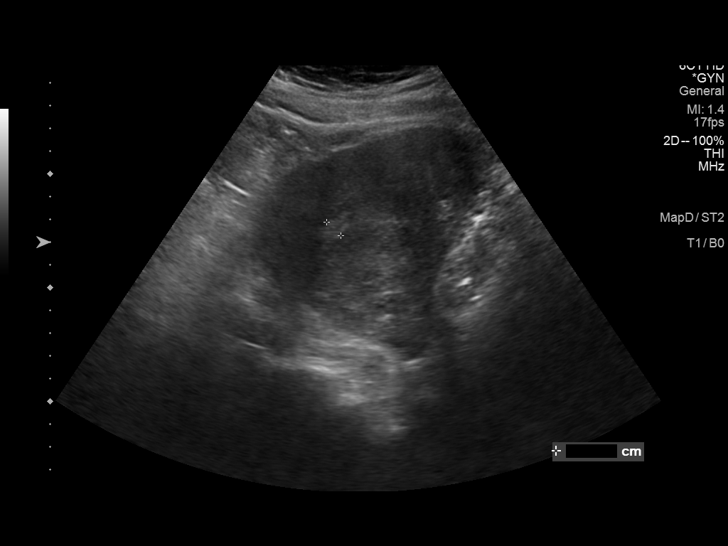
[im 31/41]
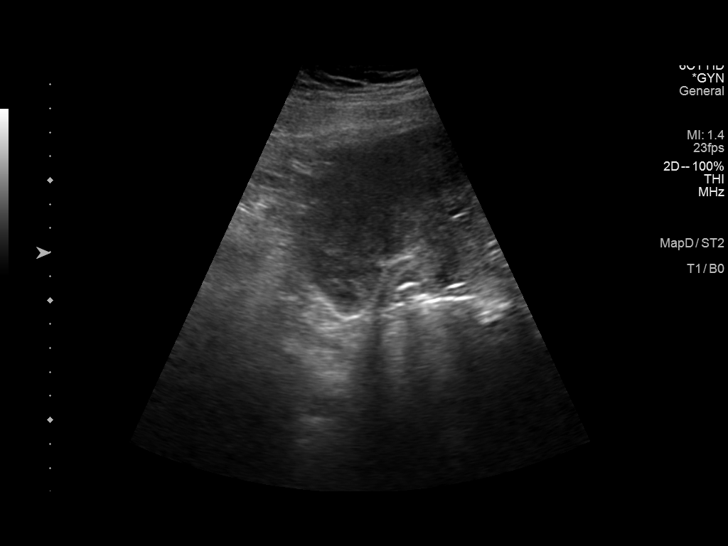
[im 34/41]
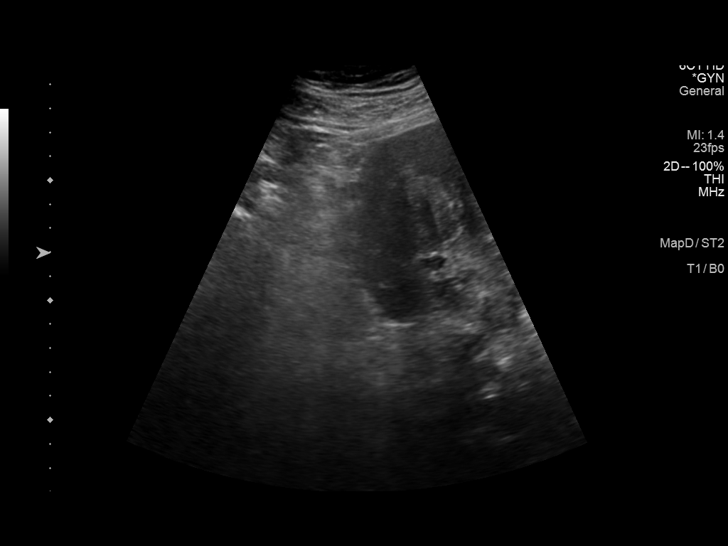
[im 37/41]
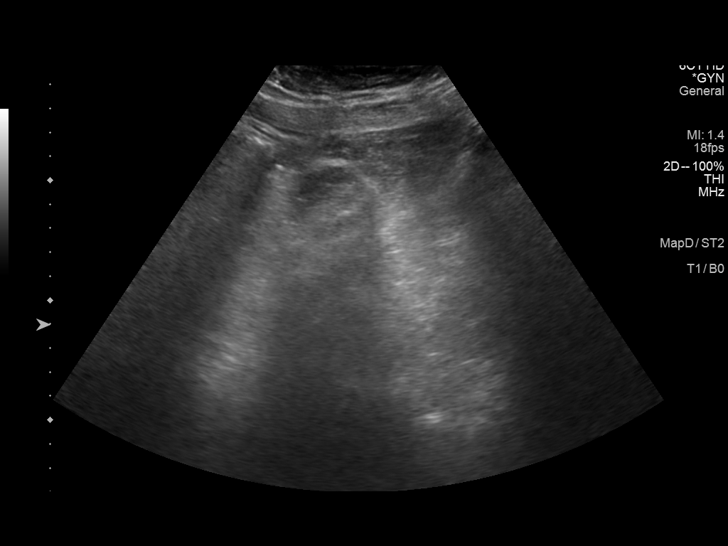
[im 41/41]
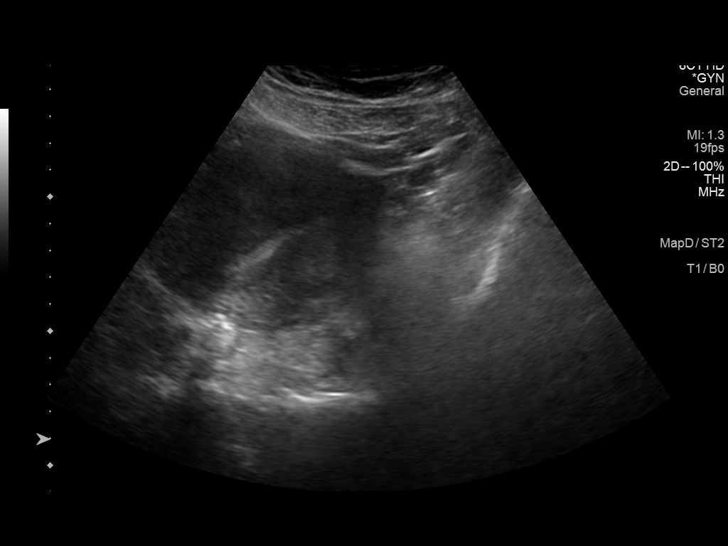

[13 of 25 positions shown; findings below may reference images not displayed]

FINDINGS: Uterus

Measurements: 14.1 x 9.5 x 8.8 cm = volume: 614 mL. Enlarged,
anteverted. Multiple uterine masses consistent with leiomyomata.
These include anterior transmural 3.9 x 3.8 x 3.8 cm which likely
extends submucosal, 3.9 x 3.8 x 3.1 cm diameter submucosal lesion at
upper uterus, and a posterior exophytic leiomyoma 3.6 x 3.1 x
cm.

Endometrium

Thickness: 9 mm.  No endometrial fluid

Right ovary

Measurements: 3.3 x 2.3 x 2.5 cm = volume: 9.7 mL. Normal morphology
without mass

Left ovary

Not visualized, likely obscured by bowel

Other findings: No free pelvic fluid. No adnexal masses. Imaging of
the pelvis and adnexa is limited by inadequate bladder distention
and poor acoustic window.
IMPRESSION: Nonvisualization of LEFT ovary.

Enlarged uterus containing multiple leiomyomata, largest 3.9 cm
diameter, likely extending submucosal.

Unremarkable endometrial complex and RIGHT ovary.

## 2019-09-07 MED ORDER — COMPLETENATE 29-1 MG PO CHEW: 1.0000 | CHEWABLE_TABLET | Freq: Every day | ORAL | 0 refills | Status: DC

## 2019-09-07 MED ORDER — METRONIDAZOLE 500 MG PO TABS: 500.0000 mg | ORAL_TABLET | Freq: Two times a day (BID) | ORAL | 0 refills | Status: AC

## 2019-09-07 NOTE — Telephone Encounter (Signed)
[facility] wanted to make sure that we received the results of the ultrasound.  Advised that they were in [place]. To PCP.  Christen Bame, CMA

## 2019-09-07 NOTE — Telephone Encounter (Signed)
I spoke with Ms Lindsley about her (+) Trichomonas result on her Pap smear from 09/03/2019.  She agreed to take Falgyl 500 mg BID for 7 days.   It was recommended that she start this regiment after she completes the clindamycin course prescribed by her oral surgeon for dental extractions.    We discussed that her husband contact her PCP to get treatment for trichomonas infection.    I recommended that we repeat testing for trichomonas infection in about 2 months.   We also discussed her normal Pap smear, with no evidence of high-risk HPV infection.  Recommended repeat Pap/HPV test in 5 years.

## 2019-09-08 ENCOUNTER — Telehealth: Payer: Self-pay | Admitting: Family Medicine

## 2019-09-08 DIAGNOSIS — N281 Cyst of kidney, acquired: Secondary | ICD-10-CM

## 2019-09-08 NOTE — Telephone Encounter (Signed)
I discussed with Alicia West the Abdominal and Pelvic US findings !) Left kidney complex cyst 2) Fatty Liver 3) Multiple uterine fibroids  We talked about the need to better characterize the kidney complex cysts in order to assess its risk for malignancy.  She agree to have the  Abdominal MRI with and without contrast recommended by Radiology. Order submitted.   She is working on weight reduction currently with a specialized diet plan.   We discussed the likelihood of these asymptomatic uterine fibroids regressing after she is in a post-menopause state.

## 2019-09-10 ENCOUNTER — Encounter: Payer: Self-pay | Admitting: Family Medicine

## 2019-09-10 ENCOUNTER — Encounter: Payer: Self-pay | Admitting: Internal Medicine

## 2019-09-21 ENCOUNTER — Encounter: Payer: Self-pay | Admitting: Family Medicine

## 2019-09-21 NOTE — Progress Notes (Signed)
Ms Alicia West had an asymptomatic,  2.0 x 2.2 x 2.0 cm complex cystic lesion at the inferior pole of the left kidney found incidentally in assessment of her history of Non-alcoholic Fatty Liver Disease monitoring with abdominal ultrasound on 09/07/19.  The interpreting radiology recommended both after personal consultation by me and in their ultrasound report recommended an abdominal MRI with and without contrast to exclude a cyctic renal neoplasm.   Please see my telephone note dated 09/08/19 in which I discussed the [place] left kidney finding and radiologist recommendation of MRI of abdomin to rule out malignancy.   An order was submitted for such an MR of abdomin with and without contrast was submitted on 09/08/19.  The requested MRI was denied by [facility] because of inadequate documentation of the reason for the procedure.    I believe the reason was documented in the 09/08/19 telephone note.   I am sending in for a reconsideration of this requested abdominal MRI with contrast and without contrast to rule out left kidney maligancy because of the concerning abdominal ultrasound showing the complex left renal complicated cyst.

## 2019-09-21 NOTE — Progress Notes (Unsigned)
I spoke with [facility] appeal for Peer-to-peer appeal of their denial of patient's abdominal MRI for r/o malignancy in left renal complicated cyst found incidentally on 09/07/19 [place] evaluation for patient's NAFLD.    I submitted for Reconsideration via fax [phone]) of approval by [facility] of order for abdominal MRI with and without contrast to r/o left renal malignancy  Case number [id number]

## 2019-09-22 ENCOUNTER — Ambulatory Visit: Payer: 59 | Admitting: Physical Therapy

## 2019-09-27 ENCOUNTER — Ambulatory Visit
Admission: RE | Admit: 2019-09-27 | Discharge: 2019-09-27 | Disposition: A | Discharge status: Home / Self Care | Payer: 59 | Source: Ambulatory Visit | Attending: Family Medicine | Admitting: Family Medicine

## 2019-09-27 ENCOUNTER — Other Ambulatory Visit: Payer: Self-pay

## 2019-09-27 DIAGNOSIS — N281 Cyst of kidney, acquired: Secondary | ICD-10-CM

## 2019-09-27 IMAGING — MR MR ABDOMEN WO/W CM
16 of 17 series · 45 of 48 positions shown · IV contrast (20ml multihance)
Comparison: [DATE] abdominal sonogram.

CLINICAL DATA: Indeterminate left renal cyst on recent sonogram.

EXAM:
MRI ABDOMEN WITHOUT AND WITH CONTRAST
TECHNIQUE: Multiplanar multisequence MR imaging of the abdomen was performed
both before and after the administration of intravenous contrast.
CONTRAST:  20mL MULTIHANCE GADOBENATE DIMEGLUMINE 529 MG/ML IV SOLN

[Series 3: T2 · coronal · 5.0mm · 0.72mm/px · 2 of 16 slices shown (1 of 3)]
[im 1/16]
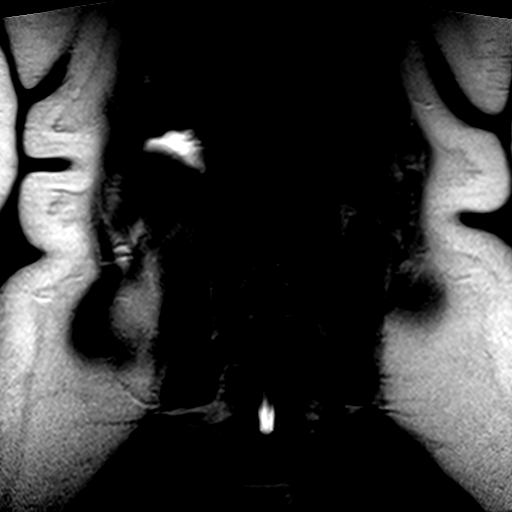
[im 16/16]
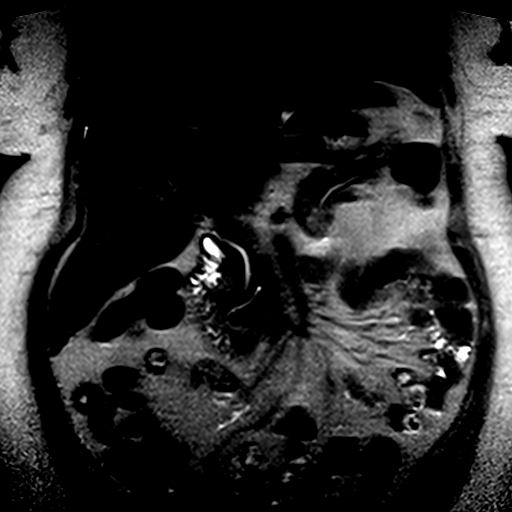

[Series 4: T2 · axial · 5.0mm · 0.66mm/px · z∈[-167,-23]mm · 2 of 25 slices shown (2 of 3)]
[im 1/25]
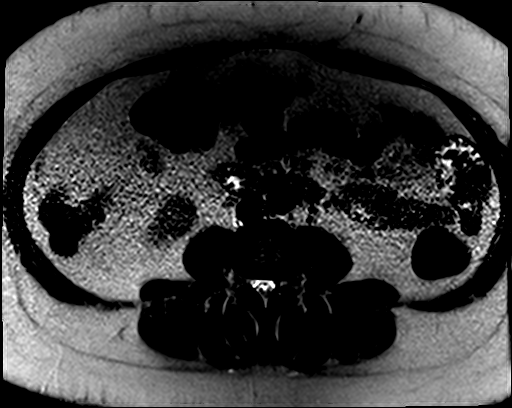
[im 25/25]
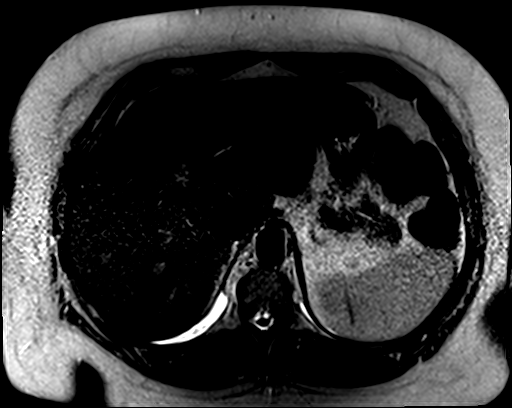

[Series 5: ep2d_diff_b50_500_800_p2_trig · axial · 6.0mm · 1.98mm/px · z∈[-171,-15]mm · 5 of 63 slices shown]
[im 1/63]
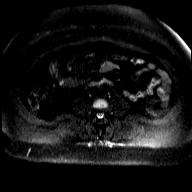
[im 16/63]
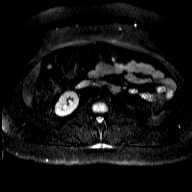
[im 32/63]
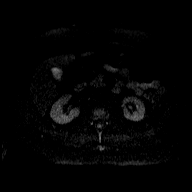
[im 47/63]
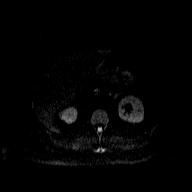
[im 63/63]
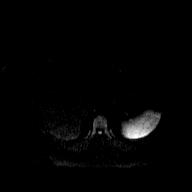

[Series 6: ep2d_diff_b50_500_800_p2_trig_adc · axial · 6.0mm · 1.98mm/px · z∈[-171,-15]mm · 2 of 21 slices shown]
[im 1/21]
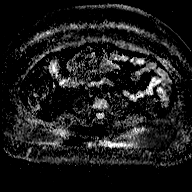
[im 21/21]
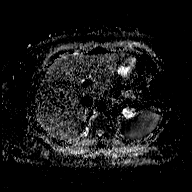

[Series 7: T2 · axial · 5.0mm · 1.33mm/px · z∈[-173,-29]mm · 2 of 25 slices shown (3 of 3)]
[im 1/25]
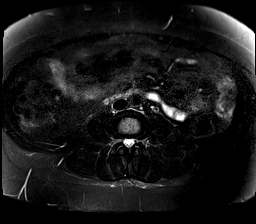
[im 25/25]
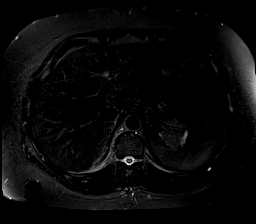

[Series 8: bSSFP · axial · 4.0mm · 0.74mm/px · z∈[-175,-27]mm · 2 of 38 slices shown]
[im 1/38]
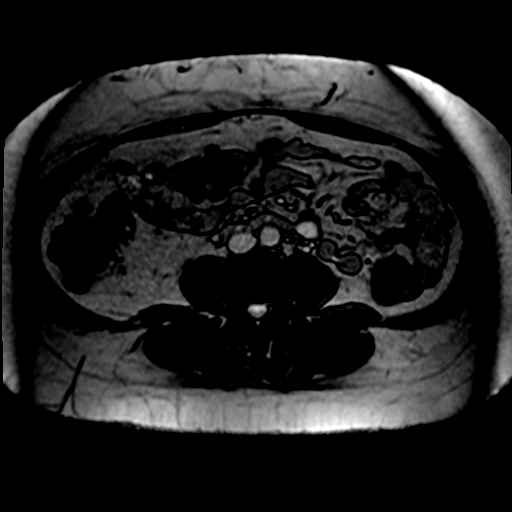
[im 38/38]
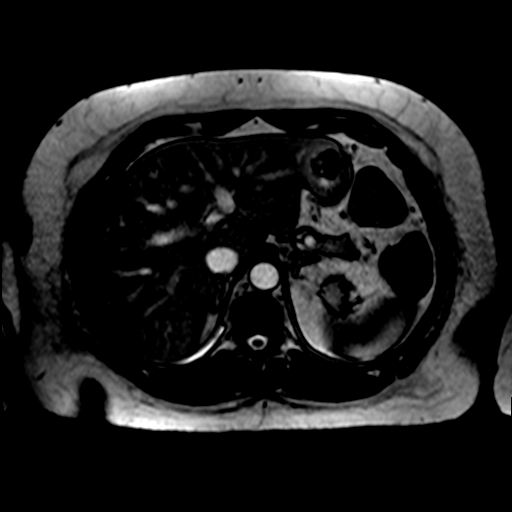

[Series 9: T1 · axial · 5.0mm · 0.66mm/px · z∈[-170,-32]mm · 3 of 48 slices shown]
[im 1/48]
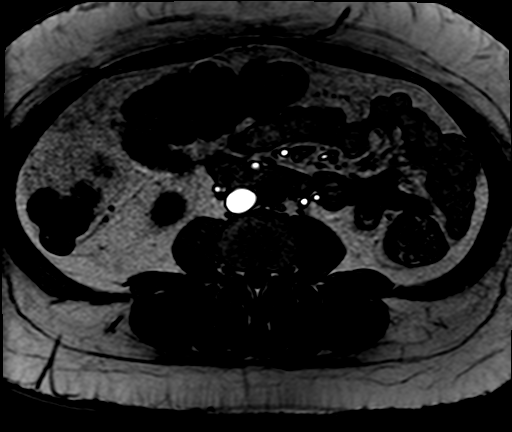
[im 24/48]
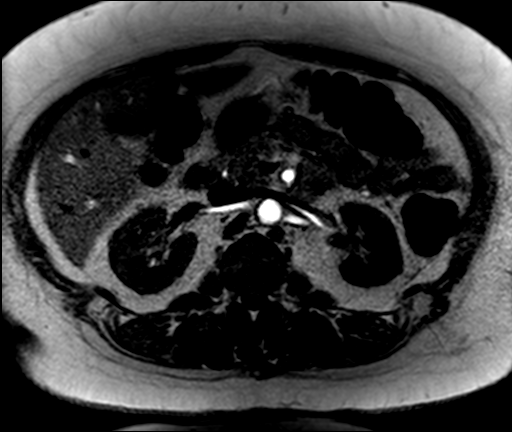
[im 48/48]
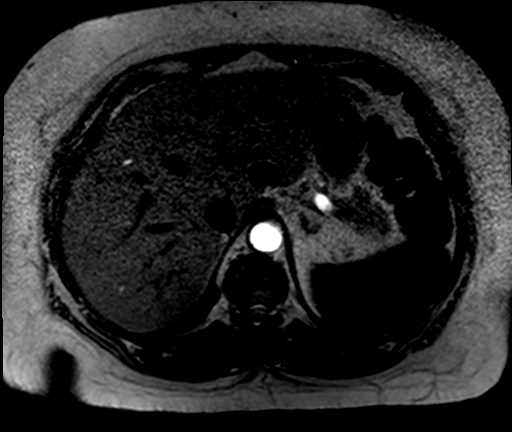

[Series 10: T1 dynamic · axial · non-contrast · 2.3mm · 1.41mm/px · z∈[-171,-35]mm · 3 of 60 slices shown]
[im 1/60]
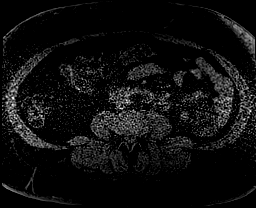
[im 30/60]
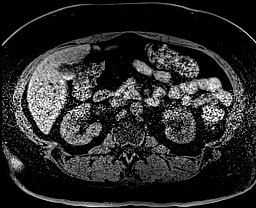
[im 60/60]
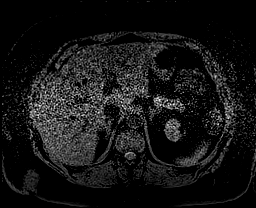

[Series 11: post 25 sec · axial · 2.3mm · 1.41mm/px · z∈[-171,-35]mm · 3 of 60 slices shown]
[im 1/60]
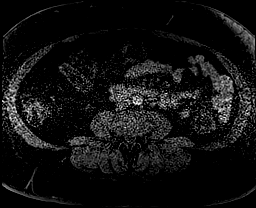
[im 30/60]
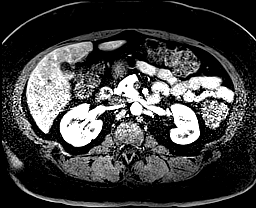
[im 60/60]
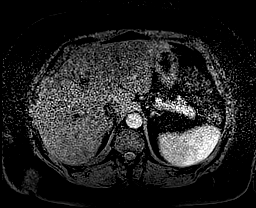

[Series 12: post 25 sec_sub · axial · 2.3mm · 1.41mm/px · z∈[-171,-35]mm · 3 of 60 slices shown]
[im 1/60]
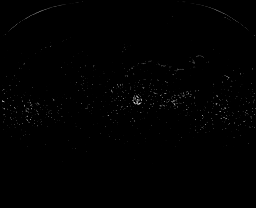
[im 30/60]
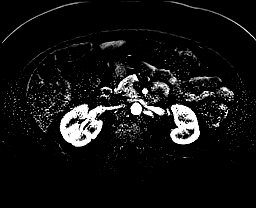
[im 60/60]
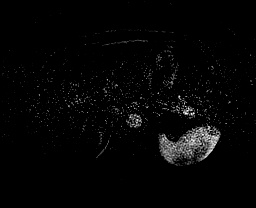

[Series 13: post 45 sec · axial · 2.3mm · 1.41mm/px · z∈[-171,-35]mm · 3 of 60 slices shown]
[im 1/60]
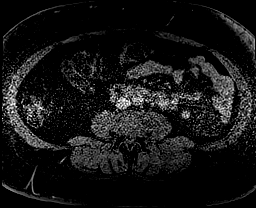
[im 30/60]
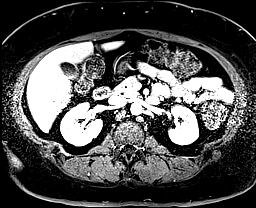
[im 60/60]
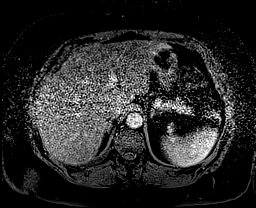

[Series 14: post 45 sec_sub · axial · 2.3mm · 1.41mm/px · z∈[-171,-35]mm · 3 of 60 slices shown]
[im 1/60]
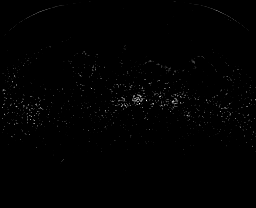
[im 30/60]
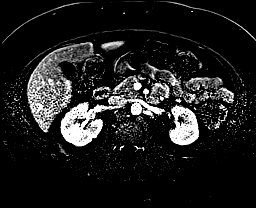
[im 60/60]
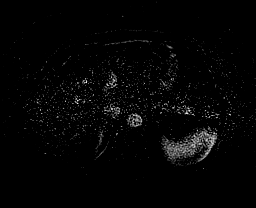

[Series 15: post 90 sec · axial · 2.3mm · 1.41mm/px · z∈[-171,-35]mm · 3 of 60 slices shown]
[im 1/60]
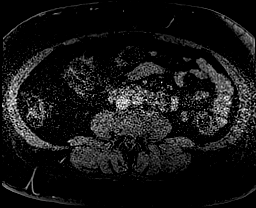
[im 30/60]
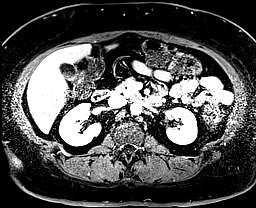
[im 60/60]
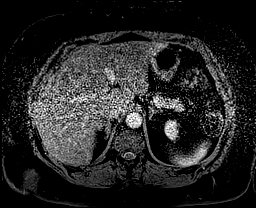

[Series 16: post 90 sec_sub · axial · 2.3mm · 1.41mm/px · z∈[-171,-35]mm · 3 of 60 slices shown]
[im 1/60]
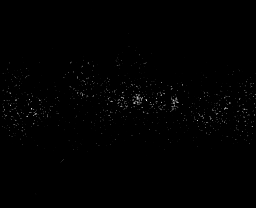
[im 30/60]
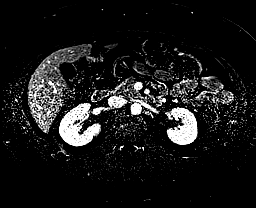
[im 60/60]
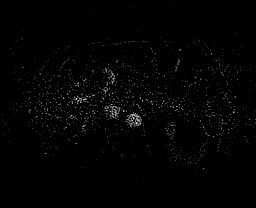

[Series 17: T1 dynamic post-contrast · coronal · 2.6mm · 0.78mm/px · 3 of 52 slices shown]
[im 1/52]
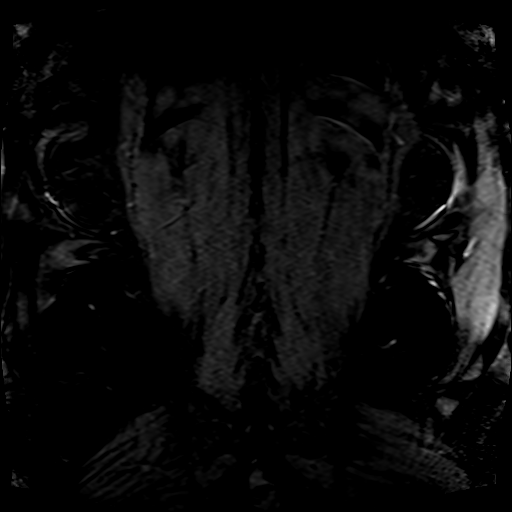
[im 26/52]
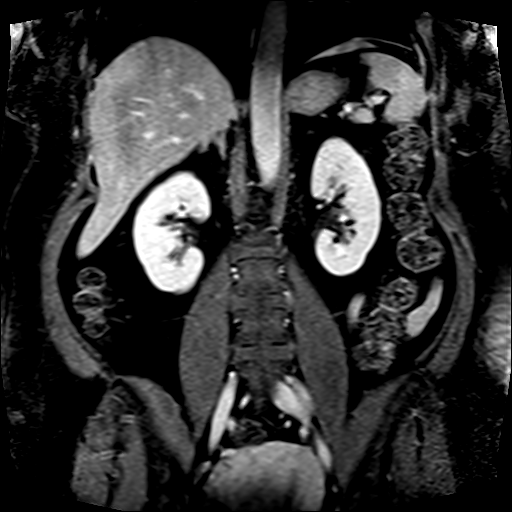
[im 52/52]
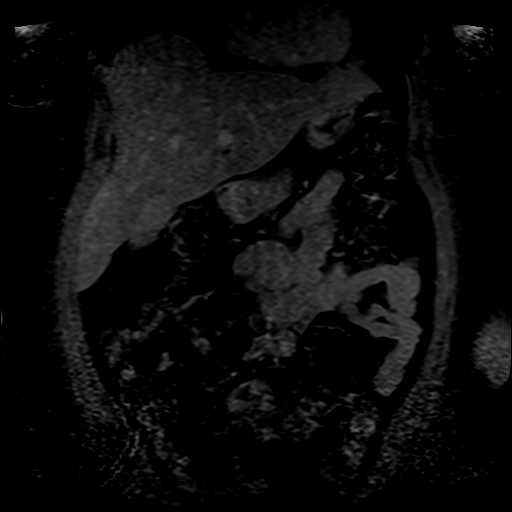

[Series 18: post axial 3+ · axial · 2.3mm · 1.41mm/px · z∈[-171,-35]mm · 3 of 60 slices shown]
[im 1/60]
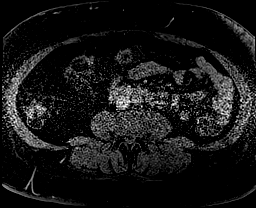
[im 30/60]
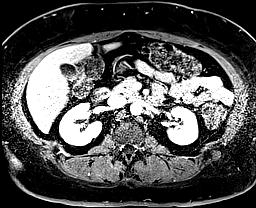
[im 60/60]
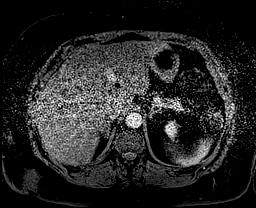

[45 of 48 positions shown; findings below may reference images not displayed]

FINDINGS: Lower chest: Trace dependent bilateral pleural effusions.

Hepatobiliary: Normal liver size and configuration. Mild diffuse
hepatic steatosis. No liver mass. Normal gallbladder with no
cholelithiasis. No biliary ductal dilatation. Common bile duct
diameter 3 mm. No choledocholithiasis. No biliary masses, strictures
or beading.

Pancreas: No pancreatic mass or duct dilation.  No pancreas divisum.

Spleen: Normal size. No mass.

Adrenals/Urinary Tract: Normal adrenals. No hydronephrosis. There is
a 2.3 x 1.6 cm renal cortical cyst in the medial lower left kidney
(series 4/image 19) with thin eccentric internal septations and
mildly lobulated contour, with no wall thickening or solid
enhancement, compatible with a Bosniak category 2 renal cyst. No
suspicious renal masses.

Stomach/Bowel: Normal non-distended stomach. Visualized small and
large bowel is normal caliber, with no bowel wall thickening.

Vascular/Lymphatic: Normal caliber abdominal aorta. Patent portal,
splenic, hepatic and renal veins. No pathologically enlarged lymph
nodes in the abdomen.

Other: No abdominal ascites or focal fluid collection.

Musculoskeletal: No aggressive appearing focal osseous lesions.
IMPRESSION: 1. Bosniak category 2 renal cyst in the lower left kidney. No
suspicious renal masses.
2. Mild diffuse hepatic steatosis.
3. Trace dependent bilateral pleural effusions.

## 2019-09-27 MED ORDER — GADOBENATE DIMEGLUMINE 529 MG/ML IV SOLN
20.0000 mL | Freq: Once | INTRAVENOUS | Status: AC | PRN
  Administered 2019-09-27: 20 mL via INTRAVENOUS

## 2019-09-28 ENCOUNTER — Encounter: Payer: Self-pay | Admitting: Family Medicine

## 2019-10-06 ENCOUNTER — Encounter: Payer: Self-pay | Admitting: Neurology

## 2019-10-06 ENCOUNTER — Ambulatory Visit (INDEPENDENT_AMBULATORY_CARE_PROVIDER_SITE_OTHER): Payer: 59 | Admitting: Neurology

## 2019-10-06 VITALS — BP 118/82 | HR 88 | Ht 69.0 in | Wt 240.0 lb

## 2019-10-06 DIAGNOSIS — R0689 Other abnormalities of breathing: Secondary | ICD-10-CM | POA: Insufficient documentation

## 2019-10-06 DIAGNOSIS — R0683 Snoring: Secondary | ICD-10-CM | POA: Insufficient documentation

## 2019-10-06 DIAGNOSIS — H35033 Hypertensive retinopathy, bilateral: Secondary | ICD-10-CM

## 2019-10-06 DIAGNOSIS — G4730 Sleep apnea, unspecified: Secondary | ICD-10-CM

## 2019-10-06 DIAGNOSIS — E1165 Type 2 diabetes mellitus with hyperglycemia: Secondary | ICD-10-CM

## 2019-10-06 DIAGNOSIS — G4710 Hypersomnia, unspecified: Secondary | ICD-10-CM

## 2019-10-06 DIAGNOSIS — E11311 Type 2 diabetes mellitus with unspecified diabetic retinopathy with macular edema: Secondary | ICD-10-CM

## 2019-10-06 HISTORY — DX: Snoring: R06.83

## 2019-10-06 HISTORY — DX: Other abnormalities of breathing: R06.89

## 2019-10-06 HISTORY — DX: Hypersomnia, unspecified: G47.10

## 2019-10-06 NOTE — Progress Notes (Signed)
SLEEP MEDICINE CLINIC    Provider:  Larey Seat, MD  Primary Care Physician:  McDiarmid, Blane Ohara, MD [place] [address]     Referring Provider: Acquanetta Sit, Md [address] [place],  [place] [address]          Chief Complaint according to patient   Patient presents with:    . New Patient (Initial Visit)           HISTORY OF PRESENT ILLNESS:  Alicia West is a 48  year old  African American female patient and was seen here upon  referral on 10/06/2019 from  [facility] Dr McDiarmid Chief concern according to patient : She presents West to discuss concerns of feeling fatigue through the day. never had a SS. describes she has felt like someone choking her in her sleep, when she sleeps supine - she admits to snoring. she admits to falling asleep at work due to being tired and despite averaging 6-7 hours of sleep.   I have the pleasure of seeing Alicia West, a right-handed [place] or [place] American female who  has a past medical history of ANEMIA, IRON DEFICIENCY, UNSPEC. (04/04/2006), Bilateral senile cataracts (11/20/2018), Binge-eating disorder, moderate (03/17/2013), Breast lump (07/22/2009), CKD stage 3 secondary to diabetes ([place]) (10/17/2018), DEPRESSIVE DISORDER, NOS (04/04/2006), Diabetes mellitus, Diabetic neuropathy ([place]) (10/17/2018), Diabetic visual loss: severe vision impairment of both eyes, with macular edema, with severe nonproliferative retinopathy, associated with type 2 diabetes mellitus ([place]) (74/82/7078), Folliculitis (6/75/4492), Glaucoma (08/18/2019), Heavy menses, Herpes labialis (09/06/2010), Herpes simplex labialis, Hypercalcemia (08/13/2019), Hypertension, Menorrhagia (04/04/2006), Morbid obesity ([place]) (05/01/2013), Oral cancer ([place]) (10/01/2013), Paroxysmal supraventricular tachycardia ([place]) (10/26/2014), POSITIVE PPD (10/18/2009), Prurigo nodularis (10/17/2018), RHINITIS, ALLERGIC (04/04/2006), TB (tuberculosis) contact,  Thrombocytosis ([place]) (08/18/2019), and Visual impairment of left eye (08/18/2019).  Status post tonsillectomy, wisdom-teeth extraction, bone grafting in lower and upper jaw-and breast reduction.   The patient never had a  sleep study;    Family medical /sleep history: a brother on CPAP with OSA, choking in his sleep, snoring.    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 The patient currently works in shifts( Presenter, broadcasting,) Pets are not  present. Tobacco use- none   ETOH use - once a year !,  Rare caffeine intake in form of Coffee( /) Soda( one a month ) Tea (/) or energy drinks. Regular exercise - none       Sleep habits are as follows: The patient's dinner time is between 7-9 PM. The patient goes to bed at 10.30-11.00 PM and continues to sleep for intervals of 2-3  hours, wakes for several  bathroom breaks, the first time at 1 AM.  Her husband snores and recorded her snoring and gasping and coughing. The bedroom is cool, quiet and dark.  The preferred sleep position is left side, due to right hip pain. Sleeps with the support of 1-2 pillows. Dreams are reportedly  frequent/vivid.  Husband gets up at 5.30- she will at 6.30  AM. The patient wakes up with an alarm.   She reports not feeling refreshed or restored in AM, with symptoms such as dry mouth(!) and throat is sore, she has phlegm. , morning headaches, groggy and dull- , and residual fatigue.  Naps are taken infrequently, lasting from 2-3 hours and are only a little more refreshing than nocturnal  sleep.    Review of Systems: Out of a complete 14 system review, the patient complains of only the following symptoms, and all other reviewed systems are negative.:  Fatigue, sleepiness , snoring, fragmented sleep, Insomnia from hip pain, nocturia with diabetes, coughing, choking, post nasal drip, allergies in season.    How likely are you to doze in the  following situations: 0 = not likely, 1 = slight chance, 2 = moderate chance, 3 = high chance   Sitting and Reading? Watching Television? Sitting inactive in a public place (theater or meeting)? As a passenger in a car for an hour without a break? Lying down in the afternoon when circumstances permit? Sitting and talking to someone? Sitting quietly after lunch without alcohol? In a car, while stopped for a few minutes in traffic?   Total = 16/ 24 points   FSS endorsed at 57/ 63 points.   Social History   Socioeconomic History  . Marital status: Married    Spouse name: Not on file  . Number of children: Not on file  . Years of education: Not on file  . Highest education level: Not on file  Occupational History  . 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 Occupation: CNA    Comment: Private duty  Tobacco Use  . Smoking status: Never Smoker  . Smokeless tobacco: Never Used  Substance and Sexual Activity  . Alcohol use: Yes    Comment: Occas  . Drug use: No  . Sexual activity: Yes    Birth control/protection: None    Comment: 1st intercourse 49 yo-More than 5 partners  Other Topics Concern  . Not on file  Social History Narrative  . Not on file   Social Determinants of Health   Financial Resource Strain:   . Difficulty of Paying Living Expenses: Not on file  Food Insecurity:   . Worried About Charity fundraiser in the Last Year: Not on file  . Ran Out of Food in the Last Year: Not on file  Transportation Needs:   . Lack of Transportation (Medical): Not on file  . Lack of Transportation (Non-Medical): Not on file  Physical Activity:   . Days of Exercise per Week: Not on file  . Minutes of Exercise per Session: Not on file  Stress:   . Feeling of Stress : Not on file  Social Connections:   . Frequency of Communication with Friends and Family: Not on file  . Frequency of Social Gatherings with Friends and Family: Not on file  . Attends Religious  Services: Not on file  . Active Member of Clubs or Organizations: Not on file  . Attends Archivist Meetings: Not on file  . Marital Status: Not on file    Family History  Problem Relation Age of Onset  . Hypertension Mother   . Diabetes Mellitus II Mother   . Congestive Heart Failure Mother        Deceased from complications of CHF  . Hypertension Father   . Diabetes Mellitus II Father   . Diabetes Mellitus II Brother     Past Medical History:  Diagnosis Date  . ANEMIA, IRON DEFICIENCY, UNSPEC. 04/04/2006   Qualifier: Diagnosis of  By: Samara Snide    . Bilateral senile cataracts 11/20/2018  . Binge-eating disorder, moderate 03/17/2013  . Breast lump 07/22/2009   Qualifier: Diagnosis of  By: Netty Starring  MD, Lucianne Muss    . CKD stage 3 secondary to  diabetes ([place]) 10/17/2018  . DEPRESSIVE DISORDER, NOS 04/04/2006   Qualifier: Diagnosis of  By: Samara Snide    . Diabetes mellitus   . Diabetic neuropathy ([place]) 10/17/2018  . Diabetic visual loss: severe vision impairment of both eyes, with macular edema, with severe nonproliferative retinopathy, associated with type 2 diabetes mellitus ([place]) 11/20/2018      . Folliculitis 2/72/5366  . Glaucoma 08/18/2019   By patient report.   Marland Kitchen Heavy menses    Pt reports she was to get a hysterectomy  . Herpes labialis 09/06/2010  . Herpes simplex labialis   . Hypercalcemia 08/13/2019  . Hypertension   . Menorrhagia 04/04/2006   Qualifier: Diagnosis of  By: Samara Snide    . Morbid obesity ([place]) 05/01/2013  . Oral cancer ([place]) 10/01/2013   As of 09/29/13: Pt reported historical dx of dental cancer by her dentist back in Nov 2014, has not regularly followed up for treatments. Hx is vague, unclear what the actual diagnosis is.   . Paroxysmal supraventricular tachycardia ([place]) 10/26/2014  . POSITIVE PPD 10/18/2009   Qualifier: Diagnosis of  By: Zebedee Iba NP, Manuela Schwartz    . Prurigo nodularis 10/17/2018  . RHINITIS, ALLERGIC 04/04/2006   Qualifier: Diagnosis of   By: Samara Snide    . TB (tuberculosis) contact    + skin test,/- chest x-ray - in mid 2000's  . Thrombocytosis ([place]) 08/18/2019  . Visual impairment of left eye 08/18/2019    Past Surgical History:  Procedure Laterality Date  . BREAST SURGERY     Reduction  . REDUCTION MAMMAPLASTY    . TONSILLECTOMY       Current Outpatient Medications on File Prior to Visit  Medication Sig Dispense Refill  . carvedilol (COREG) 25 MG tablet TAKE ONE TABLET BY MOUTH TWICE A DAY WITH A MEAL 180 tablet 3  . gabapentin (NEURONTIN) 100 MG capsule Take 1 capsule (100 mg total) by mouth at bedtime. 90 capsule 3  . glucose blood (BAYER CONTOUR NEXT TEST) test strip Use to check blood sugar three times a day or as directed. 100 each 12  . Insulin Glargine (BASAGLAR KWIKPEN) 100 UNIT/ML Inject 0.28 mLs (28 Units total) into the skin daily. 9 mL 0  . Insulin Pen Needle (TECHLITE PEN NEEDLES) 32G X 6 MM MISC Use to inject insulin daily as instructed 100 each 5  . Lancets MISC Use Contour Next Lancet to check blood glucose 3 times a day.  Dx code E11.65. 100 each 12  . losartan-hydrochlorothiazide (HYZAAR) 100-25 MG tablet Take 1 tablet by mouth daily. 90 tablet 3  . metFORMIN (GLUCOPHAGE) 500 MG tablet Take one tablet by mouth 2 times daily with a meal. Patient needs in-person or telephone app't with Dr McDiarmid before any further refills 180 tablet 3  . Prenatal Vit-Fe Fum-Fe Bisg-FA (NATACHEW) 28-1 MG CHEW Chew 1 tablet by mouth at bedtime.    . prenatal vitamin w/FE, FA (NATACHEW) 29-1 MG CHEW chewable tablet Chew 1 tablet by mouth daily at 12 noon. 90 tablet 0  . Semaglutide,0.25 or 0.5MG/DOS, (OZEMPIC, 0.25 OR 0.5 MG/DOSE,) 2 MG/1.5ML SOPN Inject 0.375 mLs (0.5 mg total) into the skin once a week.    . spironolactone (ALDACTONE) 25 MG tablet Take 1 tablet (25 mg total) by mouth daily. 90 tablet 3  . Syringe, Disposable, 1 ML MISC 1 each by Does not apply route 3 (three) times daily. 100 each 0   No current  facility-administered medications on file prior to visit.  Allergies  Allergen Reactions  . Ace Inhibitors Cough  . Amlodipine Swelling    edema  . Atorvastatin Other (See Comments)    Muscle aches in thighs  . Penicillins Other (See Comments)    Unknown childhood reaction - able to tolerate cephalosporins  . Metformin And Related     malaise    Physical exam:  West's Vitals   10/06/19 0846  BP: 118/82  Pulse: 88  Weight: 240 lb (108.9 kg)  Height: 5' 9"  (1.753 m)   Body mass index is 35.44 kg/m.   Wt Readings from Last 3 Encounters:  10/06/19 240 lb (108.9 kg)  09/03/19 (!) 242 lb (109.8 kg)  08/13/19 241 lb 9.6 oz (109.6 kg)     Ht Readings from Last 3 Encounters:  10/06/19 5' 9"  (1.753 m)  09/03/19 5' 9"  (1.753 m)  08/13/19 5' 9"  (1.753 m)      General: The patient is awake, alert and appears not in acute distress. The patient is well groomed. Head: Normocephalic, atraumatic. Neck is supple. Mallampati 3 ,  neck circumference: 17.5  inches . Nasal airflow was  patent.  Retrognathia is  seen.  Dental status: irregular, small mouth, crowded.  Cardiovascular:  Regular rate and cardiac rhythm by pulse,  without distended neck veins. Respiratory: Lungs are clear to auscultation.  Skin:  Without evidence of ankle edema, or rash. Trunk: The patient's posture is erect.   Neurologic exam : The patient is awake and alert, oriented to place and time.   Memory subjective described as intact.  Attention span & concentration ability appears normal.  Speech is fluent,  without  dysarthria, dysphonia or aphasia.  Mood and affect are appropriate.   Cranial nerves: no loss of smell or taste reported  Pupils are equal and briskly reactive to light. Funduscopic exam with retinal changes  She reports blurring , and restricted  Left peripheral vision. .  Extraocular movements in vertical and horizontal planes were intact and without nystagmus. No Diplopia. Visual fields by  finger perimetry are intact. Hearing was intact to soft voice and finger rubbing.    Facial sensation intact to fine touch.  Facial motor strength is symmetric and tongue and uvula move midline.  Neck ROM : rotation, tilt and flexion extension were normal for age and shoulder shrug was symmetrical.    Motor exam:  Symmetric bulk, tone and ROM.   Normal tone without cog- wheeling, symmetric grip strength .   Sensory:  Fine touch,and vibration were normal in the upper extremities but not felt at either big toe, and very reduced at the ankles. .  Proprioception tested in the upper extremities was normal.   Coordination: Rapid alternating movements in the fingers/hands were of normal speed.  The Finger-to-nose maneuver was intact without evidence of ataxia, dysmetria or tremor.  Gait and station: Patient could rise unassisted from a seated position, walked without assistive device.  Stance is of normal width/ base and the patient turned with 3 steps.  Toe and heel walk were deferred.  Deep tendon reflexes: in the upper and lower extremities are symmetrically attenuated.   Babinski response was deferred .      After spending a total time of 45 minutes face to face and additional time for physical and neurologic examination, review of laboratory studies,  personal review of imaging studies, reports and results of other testing and review of referral information / records as far as provided in visit, I have established the following assessments:  I have the pleasure of meeting West with Yumiko N.Knoedler, who was referred in consultation by Dr. Kinnie Scales. [place].  The patient has a stop bang questionnaire of 6, with loud snoring tiredness during the day she has felt gasping choking, she wakes up out of REM sleep and vivid dreams and sometimes feels that somebody shaped her.  Her BMI has exceeded 35 and her neck circumference has been greater than 40 cm.  She is at high risk of sleep apnea she did not  endorse the Epworth Sleepiness Scale West at 24 but still very high.  She is at risk of falling asleep in operating machinery or at the workplace.  She has a history of diabetes mellitus, hypertension, and she is fully vaccinated against COVID-19.  Her medications do not likely contribute to her degree of physical fatigue.  She started Ozempic recently which can cause some fatigue as it also causes a fullness of the stomach, however this will be a good step for her to lose weight.  She is sleeping on her side as she cannot tolerate sleeping in supine.  She does have arthritic pains which also hinders her to find rest in supine.  In addition I am concerned about her retinal diabetic changes for which she is on treatment with injections, we have learned that untreated sleep apnea can promote neovascularization and papilledema in susceptible patients.       I would like for the patient to undergo an attended sleep study possible split study if this cannot be approved by her insurance I will ask to have instead a home sleep test.      Larey Seat, MD 10/06/2019 9:18 AM  [facility] [place] certified by The [employer] of Sleep Medicine and Diplomate of the [employer] of Sleep Medicine. Board certified In Neurology through the [place], Fellow of the [employer] of Neurology. Medical Director of [employer].

## 2019-10-15 ENCOUNTER — Telehealth: Payer: Self-pay

## 2019-10-15 NOTE — Telephone Encounter (Signed)
LVM for pt to call me back to schedule sleep study  

## 2019-10-18 ENCOUNTER — Other Ambulatory Visit: Payer: Self-pay | Admitting: Family Medicine

## 2019-10-18 DIAGNOSIS — E1165 Type 2 diabetes mellitus with hyperglycemia: Secondary | ICD-10-CM

## 2019-10-18 DIAGNOSIS — E1169 Type 2 diabetes mellitus with other specified complication: Secondary | ICD-10-CM

## 2019-10-18 DIAGNOSIS — E7800 Pure hypercholesterolemia, unspecified: Secondary | ICD-10-CM

## 2019-10-22 ENCOUNTER — Telehealth: Payer: Self-pay

## 2019-10-22 NOTE — Telephone Encounter (Signed)
LVM for pt to call me back to schedule sleep study  

## 2019-10-25 ENCOUNTER — Other Ambulatory Visit: Payer: Self-pay | Admitting: Family Medicine

## 2019-10-25 DIAGNOSIS — I10 Essential (primary) hypertension: Secondary | ICD-10-CM

## 2019-10-27 ENCOUNTER — Encounter: Payer: Self-pay | Admitting: Internal Medicine

## 2019-10-27 ENCOUNTER — Ambulatory Visit (AMBULATORY_SURGERY_CENTER): Payer: Self-pay

## 2019-10-27 ENCOUNTER — Other Ambulatory Visit: Payer: Self-pay

## 2019-10-27 ENCOUNTER — Other Ambulatory Visit: Payer: Self-pay | Admitting: Family Medicine

## 2019-10-27 VITALS — Ht 69.0 in | Wt 240.0 lb

## 2019-10-27 DIAGNOSIS — Z1211 Encounter for screening for malignant neoplasm of colon: Secondary | ICD-10-CM

## 2019-10-27 DIAGNOSIS — Z800 Family history of malignant neoplasm of digestive organs: Secondary | ICD-10-CM

## 2019-10-27 DIAGNOSIS — I10 Essential (primary) hypertension: Secondary | ICD-10-CM

## 2019-10-27 MED ORDER — SUTAB 1479-225-188 MG PO TABS: 1.0000 | ORAL_TABLET | ORAL | 0 refills | Status: DC

## 2019-10-27 NOTE — Progress Notes (Signed)
No egg or soy allergy known to patient  No issues with past sedation with any surgeries or procedures----patient reports PONV- documented in medical history No intubation problems in the past  No FH of Malignant Hyperthermia No diet pills per patient No home 02 use per patient  No blood thinners per patient  Pt reports issues with constipation ---- will complete 5 days of Miralax prior to prep No A fib or A flutter  EMMI video via [place] 19 guidelines implemented in [place] today with Pt and RN  Coupon given to pt in [place] today , Code to Pharmacy  COVID vaccines completed on 04/2019 per pt;  Due to the COVID-19 pandemic we are asking patients to follow these guidelines. Please only bring one care partner. Please be aware that your care partner may wait in the car in the parking lot or if they feel like they will be too hot to wait in the car, they may wait in the lobby on the 4th floor. All care partners are required to wear a mask the entire time (we do not have any that we can provide them), they need to practice social distancing, and we will do a Covid check for all patient's and care partners when you arrive. Also we will check their temperature and your temperature. If the care partner waits in their car they need to stay in the parking lot the entire time and we will call them on their cell phone when the patient is ready for discharge so they can bring the car to the front of the building. Also all patient's will need to wear a mask into building.

## 2019-10-29 ENCOUNTER — Telehealth: Payer: Self-pay

## 2019-10-29 NOTE — Telephone Encounter (Signed)
Patient calls nurse line requesting Ozempic samples. Patient due back in clinic the first of November, per PCP last note. Please advise on samples.

## 2019-10-30 NOTE — Telephone Encounter (Addendum)
Called and spoke with the patient today regarding Ozempic samples request.  Patient reports she is injecting 0.5 mg Ozempic once weekly.    Samples provided for patient: Ozempic 0.25 or 0.5 mg/dose Quantity: 2 pens Lot: [id number]    Expiration date: 12/05/2021 Directions: Inject 0.5 mg once weekly as directed  Samples are prepared and stored in the medication room ready for patient pick up.  Patient confirmed she will come pick up the samples today (10/30/19). She reports this supply should be sufficient quantity until her next due PCP visit in early November. Patient denied any questions or concerns.  Fara Olden, PharmD PGY-1 Ambulatory Care Pharmacy Resident 10/30/2019 10:35 AM

## 2019-10-30 NOTE — Telephone Encounter (Signed)
Patient presents to front office to pick up samples. Verified name and DOB. Provided samples to patient.   Talbot Grumbling, RN

## 2019-11-10 ENCOUNTER — Encounter: Payer: 59 | Admitting: Internal Medicine

## 2019-11-10 ENCOUNTER — Telehealth: Payer: Self-pay | Admitting: Gastroenterology

## 2019-11-10 NOTE — Telephone Encounter (Signed)
Thank you Gabe. Linda, Set up a nonurgent appointment with one of our advanced practitioners (I have never seen this patient).  Thanks J.P.

## 2019-11-10 NOTE — Telephone Encounter (Signed)
I received a call from this patient around 7:15 AM. I replied to her. The patient states that she developed significant issues with taking her preparation yesterday evening into this morning.  She was able to complete one half of her preparation last night however she did not really tolerate the majority of it in theory as she had issues of nausea and vomiting. She had development of abdominal pain as well. At one point she developed shaking chills without fever. She was concerned she may be having a seizure. She ended up having EMS come out to her home and they evaluated her where she was found to have significantly elevated blood pressures both systolically and diastolically. She was not taken to the emergency room. The patient states this morning that she did not take any further preparation. She was concerned that she was going to redevelop hives as she had last night. She also has not had a bowel movement as of yet. At this point in time, it is not reasonable for this patient to continue to take the morning preparation. She is also describing having multiple other GI symptoms including issues of abdominal discomfort and nausea and vomiting. I think a clinic visit is appropriate prior to rescheduling her colonoscopy as she may need additional diagnostic endoscopy to be considered. She can be scheduled with Dr. Henrene Pastor or one of the APP's and then follow-up accordingly after she has been evaluated in the course of the coming weeks. This was a screening colonoscopy direct procedure. I will forward this to the patient's gastroenterologist who is going to do her procedure and to the [facility] charge team. She will have her case postponed this morning.   Justice Britain, MD [facility] Office # [phone]

## 2019-11-10 NOTE — Telephone Encounter (Signed)
Pt scheduled to see Alonza Bogus PA 12/08/19@9 :30am. Appt letter mailed to pt.

## 2019-11-17 ENCOUNTER — Telehealth: Payer: Self-pay

## 2019-11-17 MED ORDER — GLUCOSE BLOOD VI STRP: ORAL_STRIP | 12 refills | Status: DC

## 2019-11-17 NOTE — Telephone Encounter (Signed)
Patient calls nurse line requesting refill on test strips. Patient has not been seen for diabetic follow up since June 2021. Scheduled patient with Dr. Wendy Poet on 11/11 and refilled per Dr. Wendy Poet.   Talbot Grumbling, RN

## 2019-11-18 ENCOUNTER — Ambulatory Visit (INDEPENDENT_AMBULATORY_CARE_PROVIDER_SITE_OTHER): Payer: 59 | Admitting: Neurology

## 2019-11-18 ENCOUNTER — Other Ambulatory Visit: Payer: Self-pay

## 2019-11-18 DIAGNOSIS — G4733 Obstructive sleep apnea (adult) (pediatric): Secondary | ICD-10-CM

## 2019-11-18 DIAGNOSIS — R0683 Snoring: Secondary | ICD-10-CM

## 2019-11-18 DIAGNOSIS — R0689 Other abnormalities of breathing: Secondary | ICD-10-CM

## 2019-11-18 DIAGNOSIS — G4710 Hypersomnia, unspecified: Secondary | ICD-10-CM

## 2019-11-18 MED ORDER — BAYER CONTOUR NEXT TEST VI STRP
ORAL_STRIP | 12 refills | Status: DC
Start: 2019-11-18 — End: 2021-01-02

## 2019-11-18 NOTE — Telephone Encounter (Signed)
Reviewed and agree.

## 2019-11-26 NOTE — Progress Notes (Signed)
Summary & Diagnosis:   This HST documented an AHI of 14.6/h , mild sleep apnea, supine dependent , but also REM dependent at REM AHI of 30.3/h.   Recommendations:    REM dependent apnea is best treated by CPAP, and I will order an autotitration CPAP device with a setting from 5-15 cm water, 2 cm EPR and mask of patient's choice and comfort with heated humidification.   Interpreting Physician: Larey Seat, MD

## 2019-11-26 NOTE — Addendum Note (Signed)
Addended by: Larey Seat on: 11/26/2019 06:02 PM   Modules accepted: Orders

## 2019-11-26 NOTE — Procedures (Signed)
Sleep Study Report   Patient Information     First Name: Alicia Last Name: West ID: [id number]  Birth Date: 03-15-2070 Age: 49 Gender: Female  Referring Provider: McDiarmid, Blane Ohara, MD BMI: 35.6 (W=240 lb, H=5' 9'')  Neck Circ.:  17 '' Epworth:  16/24   Sleep Study Information    Study Date: 11/18/19 S/H/A Version: [phone] / 4.2.1023 / 79  History:    10-06-2019-patient is working as a CAN , has been fatigued, sleepy, she snores and reports nighttime choking spells.  [name] is a right-handed [place] or [place] American female who has a past medical history of ANEMIA, IRON DEFICIENCY, UNSPEC. (04/04/2006), Bilateral senile cataracts (11/20/2018), Binge-eating disorder, moderate (03/17/2013), Breast lump (07/22/2009), CKD stage 3 secondary to diabetes ([place]) (10/17/2018), DEPRESSIVE DISORDER, NOS (04/04/2006), Diabetes mellitus, Diabetic neuropathy ([place]) (10/17/2018), Diabetic visual loss: severe vision impairment of both eyes, with macular edema, with severe non-proliferative retinopathy, associated with type 2 diabetes mellitus ([place]) (91/63/8466), Folliculitis (5/99/3570), Glaucoma (08/18/2019), Heavy menses, Herpes labialis (09/06/2010), Herpes simplex labialis, Hypercalcemia (08/13/2019), Hypertension, Menorrhagia (04/04/2006), Morbid obesity ([place]) (05/01/2013), Oral cancer ([place]) (10/01/2013), Paroxysmal supraventricular tachycardia ([place]) (10/26/2014), POSITIVE PPD (10/18/2009), Prurigo nodularis (10/17/2018), RHINITIS, ALLERGIC (04/04/2006), TB (tuberculosis) contact, Thrombocytosis ([place]) (08/18/2019), and Visual impairment of left eye (08/18/2019).   Summary & Diagnosis:    This HST documented an AHI of 14.6/h , mild sleep apnea, supine dependent , but also REM dependent at REM AHI of 30.3/h.   Recommendations:     REM dependent apnea is best treated by CPAP, and I will order an autotitration CPAP device with a setting from 5-15 cm water, 2 cm EPR and mask of patient's choice and comfort with heated  humidification.   Interpreting Physician: Larey Seat, MD           Sleep Summary  Oxygen Saturation Statistics   Start Study Time: End Study Time: Total Recording Time:          10:39:07 PM 7:20:58 AM   8 h, 41 min  Total Sleep Time % REM of Sleep Time:  7 h, 34 min  30.0    Mean: 94 Minimum: 88 Maximum: 98  Mean of Desaturations Nadirs (%):   91  Oxygen Desaturation. %:  4-9 10-20 >20 Total  Events Number Total   31  0 96.9 0.0  1 3.1  32 100.0  Oxygen Saturation: <90 <=88 <85 <80 <70  Duration (minutes): Sleep % 0.5 0.1 0.1 0.0 0.0 0.0 0.0 0.0 0.0 0.0     Respiratory Indices      Total Events REM NREM All Night  pRDI: pAHI 3%: ODI 4%: pAHIc 3%: % CSR: pAHI 4%:  106  102  32  0 0.0 29 30.3 30.3 13.7 0.0 10.5 9.8 1.7 0.0 15.3 14.8 4.6 0.0 4.2       Pulse Rate Statistics during Sleep (BPM)      Mean: 98 Minimum: 87 Maximum: 119    Indices are calculated using technically valid sleep time of 6 h, 54 min. Central-Indices are calculated using technically valid sleep time of 6 h, 50 min. pRDI/pAHI are calculated using oxi desaturations ? 3%                      pAHI=14.8                                    Mild  Moderate                    Severe                                                 5              15

## 2019-11-27 ENCOUNTER — Encounter: Payer: Self-pay | Admitting: Family Medicine

## 2019-11-27 DIAGNOSIS — G4733 Obstructive sleep apnea (adult) (pediatric): Secondary | ICD-10-CM

## 2019-11-27 HISTORY — DX: Obstructive sleep apnea (adult) (pediatric): G47.33

## 2019-11-30 ENCOUNTER — Telehealth: Payer: Self-pay | Admitting: Neurology

## 2019-11-30 NOTE — Telephone Encounter (Signed)
-----   Message from Larey Seat, MD sent at 11/26/2019  6:02 PM EDT ----- Summary & Diagnosis:   This HST documented an AHI of 14.6/h , mild sleep apnea, supine dependent , but also REM dependent at REM AHI of 30.3/h.   Recommendations:    REM dependent apnea is best treated by CPAP, and I will order an autotitration CPAP device with a setting from 5-15 cm water, 2 cm EPR and mask of patient's choice and comfort with heated humidification.   Interpreting Physician: Larey Seat, MD

## 2019-11-30 NOTE — Telephone Encounter (Signed)
I called pt. I advised pt that Dr. Brett Fairy reviewed their sleep study results and found that pt mild sleep apnea. Dr. Brett Fairy recommends that pt starts auto CPAP. I reviewed PAP compliance expectations with the pt. Pt is agreeable to starting a CPAP. I advised pt that an order will be sent to a DME, [facility] ([place] and [facility] will call the pt within about one week after they file with the pt's insurance. [facility]) will show the pt how to use the machine, fit for masks, and troubleshoot the CPAP if needed. A follow up appt will need to be made for insurance purposes 31-90 days after setting up with the machine. Pt verbalized understanding of results. Pt had no questions at this time but was encouraged to call back if questions arise. I have sent the order to [facility] and have received confirmation that they have received the order.

## 2019-12-08 ENCOUNTER — Other Ambulatory Visit (INDEPENDENT_AMBULATORY_CARE_PROVIDER_SITE_OTHER): Payer: 59

## 2019-12-08 ENCOUNTER — Telehealth: Payer: Self-pay

## 2019-12-08 ENCOUNTER — Ambulatory Visit (INDEPENDENT_AMBULATORY_CARE_PROVIDER_SITE_OTHER): Payer: 59 | Admitting: Gastroenterology

## 2019-12-08 ENCOUNTER — Other Ambulatory Visit: Payer: Self-pay

## 2019-12-08 ENCOUNTER — Encounter: Payer: Self-pay | Admitting: Gastroenterology

## 2019-12-08 DIAGNOSIS — K5909 Other constipation: Secondary | ICD-10-CM

## 2019-12-08 DIAGNOSIS — D509 Iron deficiency anemia, unspecified: Secondary | ICD-10-CM | POA: Diagnosis not present

## 2019-12-08 DIAGNOSIS — K219 Gastro-esophageal reflux disease without esophagitis: Secondary | ICD-10-CM | POA: Diagnosis not present

## 2019-12-08 DIAGNOSIS — D75839 Thrombocytosis, unspecified: Secondary | ICD-10-CM

## 2019-12-08 LAB — CBC WITH DIFFERENTIAL/PLATELET
Basophils Absolute: 0.1 10*3/uL (ref 0.0–0.1)
Basophils Relative: 1.1 % (ref 0.0–3.0)
Eosinophils Absolute: 0.4 10*3/uL (ref 0.0–0.7)
Eosinophils Relative: 4.7 % (ref 0.0–5.0)
HCT: 33.4 % — ABNORMAL LOW (ref 36.0–46.0)
Hemoglobin: 10.9 g/dL — ABNORMAL LOW (ref 12.0–15.0)
Lymphocytes Relative: 18.3 % (ref 12.0–46.0)
Lymphs Abs: 1.5 10*3/uL (ref 0.7–4.0)
MCHC: 32.6 g/dL (ref 30.0–36.0)
MCV: 82.5 fl (ref 78.0–100.0)
Monocytes Absolute: 0.6 10*3/uL (ref 0.1–1.0)
Monocytes Relative: 7.1 % (ref 3.0–12.0)
Neutro Abs: 5.8 10*3/uL (ref 1.4–7.7)
Neutrophils Relative %: 68.8 % (ref 43.0–77.0)
Platelets: 550 10*3/uL — ABNORMAL HIGH (ref 150.0–400.0)
RBC: 4.05 Mil/uL (ref 3.87–5.11)
RDW: 15.5 % (ref 11.5–15.5)
WBC: 8.4 10*3/uL (ref 4.0–10.5)

## 2019-12-08 LAB — IBC + FERRITIN
Ferritin: 13 ng/mL (ref 10.0–291.0)
Iron: 19 ug/dL — ABNORMAL LOW (ref 42–145)
Saturation Ratios: 4.3 % — ABNORMAL LOW (ref 20.0–50.0)
Transferrin: 316 mg/dL (ref 212.0–360.0)

## 2019-12-08 MED ORDER — PLENVU 140 G PO SOLR: 1.0000 | ORAL | 0 refills | Status: DC

## 2019-12-08 MED ORDER — LINACLOTIDE 145 MCG PO CAPS: 145.0000 ug | ORAL_CAPSULE | Freq: Every day | ORAL | 5 refills | Status: DC

## 2019-12-08 MED ORDER — LINACLOTIDE 145 MCG PO CAPS: 290.0000 ug | ORAL_CAPSULE | Freq: Every day | ORAL | 5 refills | Status: DC

## 2019-12-08 MED ORDER — LINACLOTIDE 290 MCG PO CAPS: 290.0000 ug | ORAL_CAPSULE | Freq: Every day | ORAL | 5 refills | Status: DC

## 2019-12-08 NOTE — Telephone Encounter (Signed)
Attempted to reach pt. No answer. LVM of note left by doctor. Salvatore Marvel, CMA

## 2019-12-08 NOTE — Progress Notes (Signed)
Assessment and plan noted

## 2019-12-08 NOTE — Telephone Encounter (Signed)
Patient LVM on nurse line requesting to start b12 injections. Patient states she is being treated for iron deficiency, however she can not tolerate the iron pills. It appears she may have had this taken care of by [facility] today, however unsure. I attempted to call patient with no response. Will forward to PCP.

## 2019-12-08 NOTE — Patient Instructions (Addendum)
If you are age 50 or older, your body mass index should be between 23-30. Your Body mass index is 35.74 kg/m. If this is out of the aforementioned range listed, please consider follow up with your Primary Care Provider.  If you are age 45 or younger, your body mass index should be between 19-25. Your Body mass index is 35.74 kg/m. If this is out of the aformentioned range listed, please consider follow up with your Primary Care Provider.   Your provider has requested that you go to the basement level for lab work before leaving today. Press "B" on the elevator. The lab is located at the first door on the left as you exit the elevator.  You have been scheduled for an endoscopy and colonoscopy. Please follow the written instructions given to you at your visit today. Please pick up your prep supplies at the pharmacy within the next 1-3 days. If you use inhalers (even only as needed), please bring them with you on the day of your procedure.  Due to recent changes in healthcare laws, you may see the results of your imaging and laboratory studies on MyChart before your provider has had a chance to review them.  We understand that in some cases there may be results that are confusing or concerning to you. Not all laboratory results come back in the same time frame and the provider may be waiting for multiple results in order to interpret others.  Please give [place] 48 hours in order for your provider to thoroughly review all the results before contacting the office for clarification of your results.   We have sent the following medications to your pharmacy for you to pick up at your convenience:  START: Linzess 145 mcg one capsule daily.  Thank you for entrusting me with your care and choosing [facility].  Alonza Bogus, PA-C

## 2019-12-08 NOTE — Progress Notes (Signed)
12/08/2019 Alicia West [id number] 1970/07/28   HISTORY OF PRESENT ILLNESS: This is a 49 year old female who was scheduled for colonoscopy for screening purposes with Dr. Henrene Pastor in early October.  She had been given Sutab and apparently had issues with vomiting, abdominal pain, shaking chills.  She called EMS and her BP was found to be very high.  She did not end up getting taken to the hospital and was feeling better by the next morning.  Nonetheless, she did not complete her prep and did not have her colonoscopy.  She is here today to reschedule that using a different prep.  While she is here she reports that she has issues with constipation, sometimes not having a bowel movement for 4 days or so.  Says that she uses MiraLAX, Dulcolax, and suppositories as needed and sometimes has to manually disimpact herself.  Has tried MiraLAX regularly in the past.  She also reports a lot of indigestion.  Does not take anything regularly for it, but says she has been taking a lot of Pepto-Bismol for it recently.  She denies regular NSAID use, only uses it on a rare occasion.  She reports that she has had issues with iron deficiency anemia since her teenage years.  She tells me that her menses are very heavy.  No blood counts or iron studies checked since June/July.  Tells me she has been very fatigued recently.  Hgb in June was 10.4 grams.   Past Medical History:  Diagnosis Date  . Allergy    seasonal sinus allergies  . ANEMIA, IRON DEFICIENCY, UNSPEC. 04/04/2006   Qualifier: Diagnosis of  By: Samara Snide    . Bilateral senile cataracts 11/20/2018  . Binge-eating disorder, moderate 03/17/2013  . Breast lump 07/22/2009   Qualifier: Diagnosis of  By: Netty Starring  MD, Lucianne Muss    . CKD stage 3 secondary to diabetes ([place]) 10/17/2018  . DEPRESSIVE DISORDER, NOS 04/04/2006   Qualifier: Diagnosis of  By: Samara Snide    . Diabetes mellitus    on meds  . Diabetic macular edema ([place])    by patient report  10/27/2019  . Diabetic neuropathy ([place]) 10/17/2018  . Diabetic peripheral neuropathy ([place])    per patient  . Diabetic visual loss: severe vision impairment of both eyes, with macular edema, with severe nonproliferative retinopathy, associated with type 2 diabetes mellitus ([place]) 11/20/2018      . Folliculitis 0/30/0923  . Heavy menses    Pt reports she was to get a hysterectomy  . Herpes labialis 09/06/2010  . Herpes simplex labialis   . Hypercalcemia 08/13/2019  . Hyperlipidemia    diet controlled/not taking meds for this  . Hypertension    on meds  . Menorrhagia 04/04/2006   Qualifier: Diagnosis of  By: Samara Snide    . Morbid obesity ([place]) 05/01/2013  . Neuromuscular disorder ([place])   . Obstructive sleep apnea 11/27/2019  . Osteoporosis    bilateral hips R>L  . Paroxysmal supraventricular tachycardia ([place]) 10/26/2014  . PONV (postoperative nausea and vomiting)   . POSITIVE PPD 10/18/2009   Qualifier: Diagnosis of  By: Zebedee Iba NP, Manuela Schwartz    . Prurigo nodularis 10/17/2018  . RHINITIS, ALLERGIC 04/04/2006   Qualifier: Diagnosis of  By: Samara Snide    . TB (tuberculosis) contact    + skin test,/- chest x-ray - in mid 2000's  . Thrombocytosis 08/18/2019  . Tuberculosis    postitive 2011-[facility]-cleared per pt  . Visual  impairment of left eye 08/18/2019   Past Surgical History:  Procedure Laterality Date  . REDUCTION MAMMAPLASTY    . TONSILLECTOMY    . TONSILLECTOMY    . WISDOM TOOTH EXTRACTION      reports that she has never smoked. She has never used smokeless tobacco. She reports current alcohol use. She reports that she does not use drugs. family history includes Colon cancer (age of onset: 39) in her maternal uncle; Colon cancer (age of onset: 52) in her father; Colon polyps (age of onset: 85) in her brother; Colon polyps (age of onset: 55) in her maternal uncle; Colon polyps (age of onset: 73) in her father; Congestive Heart Failure in her mother; Diabetes Mellitus II  in her brother, father, and mother; Hypertension in her father and mother. Allergies  Allergen Reactions  . Ace Inhibitors Cough  . Lipitor [Atorvastatin] Other (See Comments)    Muscle aches in thighs  . Norvasc [Amlodipine] Swelling    edema  . Penicillins Other (See Comments)    Unknown childhood reaction - able to tolerate cephalosporins  . Metformin And Related     malaise      Outpatient Encounter Medications as of 12/08/2019  Medication Sig  . carvedilol (COREG) 25 MG tablet TAKE ONE TABLET BY MOUTH TWICE A DAY WITH A MEAL  . gabapentin (NEURONTIN) 100 MG capsule Take 1 capsule (100 mg total) by mouth at bedtime.  Marland Kitchen glucose blood (BAYER CONTOUR NEXT TEST) test strip Use to check blood sugar three times a day or as directed.  Marland Kitchen ibuprofen (ADVIL) 400 MG tablet Take by mouth daily as needed.  . Insulin Glargine (BASAGLAR KWIKPEN) 100 UNIT/ML Inject 0.28 mLs (28 Units total) into the skin daily.  . Insulin Pen Needle (TECHLITE PEN NEEDLES) 32G X 6 MM MISC Use to inject insulin daily as instructed  . Lancets MISC Use Contour Next Lancet to check blood glucose 3 times a day.  Dx code E11.65.  Marland Kitchen losartan-hydrochlorothiazide (HYZAAR) 100-25 MG tablet TAKE ONE TABLET BY MOUTH DAILY  . metFORMIN (GLUCOPHAGE) 500 MG tablet TAKE ONE TABLET BY MOUTH TWICE A DAY WITH A MEAL  . Prenatal Vit-Fe Fum-Fe Bisg-FA (NATACHEW) 28-1 MG CHEW Chew 1 tablet by mouth at bedtime.  . Semaglutide,0.25 or 0.5MG/DOS, (OZEMPIC, 0.25 OR 0.5 MG/DOSE,) 2 MG/1.5ML SOPN Inject 0.375 mLs (0.5 mg total) into the skin once a week.  . Sodium Sulfate-Mag Sulfate-KCl (SUTAB) [phone] MG TABS Take 1 kit by mouth as directed. MANUFACTURER CODES!! BIN: [id number] PCN: CN GROUP: [id number] MEMBER ID: [id number];VHQ AS CASH;NO PRIOR AUTHORIZATION  . spironolactone (ALDACTONE) 25 MG tablet TAKE ONE TABLET BY MOUTH DAILY  . Syringe, Disposable, 1 ML MISC 1 each by Does not apply route 3 (three) times daily.   No  facility-administered encounter medications on file as of 12/08/2019.     REVIEW OF SYSTEMS  : All other systems reviewed and negative except where noted in the History of Present Illness.   PHYSICAL EXAM: Ht 5' 8.5" (1.74 m) Comment: height measured without shoes  Wt 238 lb 8 oz (108.2 kg)   BMI 35.74 kg/m  General: Well developed AA female in no acute distress Head: Normocephalic and atraumatic Eyes:  Sclerae anicteric, conjunctiva pink. Ears: Normal auditory acuity Lungs: Clear throughout to auscultation; no W/R/R. Heart: Regular rate and rhythm; no M/R/G. Abdomen: Soft, non-distended.  BS present.  Mild diffuse TTP. Rectal:  Will be done at the time of colonoscopy. Musculoskeletal: Symmetrical with no gross deformities  Skin: No lesions on visible extremities Extremities: No edema  Neurological: Alert oriented x 4, grossly non-focal Psychological:  Alert and cooperative. Normal mood and affect  ASSESSMENT AND PLAN: *IDA:  Long-standing since her teenage years, has heavy menses.  Likely secondary to GYN losses.  Was already scheduled for colonoscopy but will add EGD as well.  Will check CBC and iron studies today. *GERD:  Reports frequent indigestion.  Does not take any medication regularly.  Will plan for EGD as above.  May need daily medication pending findings.  Will await results. *Chronic constipation:  Will start Linzess 145 mcg daily before food.  Prescription sent and samples given. *IDDM:  Insulin will be adjusted prior to endoscopic procedure per protocol. Will resume normal dosing after procedure.  **The risks, benefits, and alternatives to EGD and colonoscopy were discussed with the patient and she consents to proceed.   CC:  McDiarmid, Blane Ohara, MD

## 2019-12-08 NOTE — Telephone Encounter (Signed)
Please let Alicia West know her vitamin B12 level in July was normal.  She does not require Vitamin B12 injections.

## 2019-12-09 ENCOUNTER — Telehealth: Payer: Self-pay | Admitting: Physician Assistant

## 2019-12-09 NOTE — Telephone Encounter (Signed)
Received a new hem referral from Dr. Henrene Pastor for iron deficiency anemia and thrombcytosis. Alicia West has been cld and scheduled to see Cassie on 11/22 at 130pm w/labs at 1pm.

## 2019-12-09 NOTE — Telephone Encounter (Signed)
Patient returns call to nurse line. Informed of below information. Patient reports that she is continuing to have excessive fatigue.   Patient had recent labs yesterday. Patient is scheduled for iron infusion 11/22. Patient cancelled appointment on 11[place]11 [place].   Please advise any additional recommendations.   Talbot Grumbling, RN

## 2019-12-11 NOTE — Telephone Encounter (Signed)
Patient informed.  She expressed that this process has taken a long time since she has to seen onc for consult first and then come back for her infusion.  I explained to patient that the next step is this infusion and she may need to check with them on getting in sooner.  Patient voiced understanding.  Cyncere Sontag,CMA

## 2019-12-11 NOTE — Telephone Encounter (Signed)
Recommend waiting to see how she feels two weeks after she receives her iron transfusion, that should give time for the iron to help build up her red blood cells.   If she is no better after two weeks, please make an appointment to see Dr Dimple Bastyr,

## 2019-12-17 ENCOUNTER — Ambulatory Visit: Payer: 59 | Admitting: Family Medicine

## 2019-12-27 NOTE — Progress Notes (Signed)
[place] [phone]   [phone]  CONSULT NOTE  REFERRING PHYSICIAN: Alonza Bogus PA-C  REASON FOR CONSULTATION:  Iron Deficiency Anemia  HPI Alicia West is a 49 y.o. female with a past medical history for HTN, OSA, chronic constipation, questionable non-alcoholic hepatosteatosis, GERD, DM, diabetic peripheral neuropathy, macular degeneration, obesity, and IDA is referred to the clinic for management of her iron deficiency anemia.   The patient has a history of iron deficiency anemia since her teenage years.   The patient had a follow-up appointment with her gastroenterology on 12/08/2019. The patient was seeing them for consideration of routine colonoscopy. Given her IDA, they also are planning on performing a EGD on 01/12/20 which will be performed under the care of Dr. Henrene Pastor. Her iron studies were repeated on 12/08/19 which showed a Hgb of 10.9. Her labs also show persistent thrombocytosis with a platelet count between 400-upper 500k for the last 10+ years. She had repeat iron studies which showed a low iron at 19 and saturation low at 4.3%. She was referred to the clinic for evaluation and management of her anemia as well as the thrombocytosis.   Overall, the patient is feeling fatigued.  She does not ice craving.  She denies any significant chest pain or palpitations.She reports some dyspnea with taking a shower. She reports some light headedness. She has been taking iron supplements 3x per week for about 2 weeks. She is not compliant with these due to significant GI upset. It exacerbated her chronic constipation. She was recently placed on Linzess which helped. She tried the nature made brand of OTC iron. She has a prescription iron that she has not tried taking yet due to it being too expensive.  The patient denies any abnormal bleeding or bruising including epistaxis, gingival bleeding, hemoptysis, hematemesis, melena, hematochezia, easy  bruising, or hematuria.  She does still have menstrual periods every 4 weeks. She states that her menstrual periods last 7 days. She used to have menstrual periods that lasted weeks at a time but that has improved since being placed on birth control pills by her PCP.  She estimates that she uses 3 large over night pads at a time during her periods and need to change them every 1 hour or so. She saw OBGYN in the past and states that she had some small fibroids. She denies any particular dietary habits such as being a vegan or vegetarian. She has red meat about 1-2x per week.  The patient denies any blood thinner use.  She states that she rarely takes NSAIDs. She used to take at 81 mg aspirin daily but has not done so for several weeks.   She denies any nausea or vomiting.  Patient denies any fever, chills, night sweats, lymphadenopathy, or unexplained weight loss.  She denies any history of stroke or heart attack.  The patient's family history consists of a mother with diabetes, hypertension, and also anemia reportedly secondary to heavy menstrual bleeding.  The patient's father has diabetes and cancer.  The patient is unsure what the primary site is of her father's cancer but believes it may have been colorectal.  The patient denies any family history of any bone marrow issues or history of thalassemia or sickle cell anemia.  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  She is married and has 1 child.  She denies any drug or tobacco use.  She drinks approximately 1  alcoholic beverage per year.   HPI  Past Medical History:  Diagnosis Date  . Allergy    seasonal sinus allergies  . ANEMIA, IRON DEFICIENCY, UNSPEC. 04/04/2006   Qualifier: Diagnosis of  By: Samara Snide    . Bilateral senile cataracts 11/20/2018  . Binge-eating disorder, moderate 03/17/2013  . Breast lump 07/22/2009   Qualifier: Diagnosis of  By: Netty Starring  MD, Lucianne Muss    . CKD stage 3 secondary  to diabetes ([place]) 10/17/2018  . DEPRESSIVE DISORDER, NOS 04/04/2006   Qualifier: Diagnosis of  By: Samara Snide    . Diabetes mellitus    on meds  . Diabetic macular edema ([place])    by patient report 10/27/2019  . Diabetic neuropathy ([place]) 10/17/2018  . Diabetic peripheral neuropathy ([place])    per patient  . Diabetic visual loss: severe vision impairment of both eyes, with macular edema, with severe nonproliferative retinopathy, associated with type 2 diabetes mellitus ([place]) 11/20/2018      . Folliculitis 09/07/2120  . Heavy menses    Pt reports she was to get a hysterectomy  . Herpes labialis 09/06/2010  . Herpes simplex labialis   . Hypercalcemia 08/13/2019  . Hyperlipidemia    diet controlled/not taking meds for this  . Hypertension    on meds  . Menorrhagia 04/04/2006   Qualifier: Diagnosis of  By: Samara Snide    . Morbid obesity ([place]) 05/01/2013  . Neuromuscular disorder ([place])   . Obstructive sleep apnea 11/27/2019  . Osteoporosis    bilateral hips R>L  . Paroxysmal supraventricular tachycardia ([place]) 10/26/2014  . PONV (postoperative nausea and vomiting)   . POSITIVE PPD 10/18/2009   Qualifier: Diagnosis of  By: Zebedee Iba NP, Manuela Schwartz    . Prurigo nodularis 10/17/2018  . RHINITIS, ALLERGIC 04/04/2006   Qualifier: Diagnosis of  By: Samara Snide    . Sleep apnea   . TB (tuberculosis) contact    + skin test,/- chest x-ray - in mid 2000's  . Thrombocytosis 08/18/2019  . Tuberculosis    postitive 2011-[facility]ared per pt  . Visual impairment of left eye 08/18/2019    Past Surgical History:  Procedure Laterality Date  . REDUCTION MAMMAPLASTY    . TONSILLECTOMY    . TONSILLECTOMY    . WISDOM TOOTH EXTRACTION      Family History  Problem Relation Age of Onset  . Hypertension Mother   . Diabetes Mellitus II Mother   . Congestive Heart Failure Mother        Deceased from complications of CHF  . Hypertension Father   . Diabetes Mellitus II Father   . Colon polyps  Father 14  . Colon cancer Father 21  . Diabetes Mellitus II Brother   . Colon polyps Brother 2  . Colon polyps Maternal Uncle 62  . Colon cancer Maternal Uncle 62  . Esophageal cancer Neg Hx   . Stomach cancer Neg Hx   . Rectal cancer Neg Hx     Social History Social History   Tobacco Use  . Smoking status: Never Smoker  . Smokeless tobacco: Never Used  Vaping Use  . Vaping Use: Never used  Substance Use Topics  . Alcohol use: Yes    Comment: Occas  . Drug use: No    Allergies  Allergen Reactions  . Ace Inhibitors Cough  . Lipitor [Atorvastatin] Other (See Comments)    Muscle aches in thighs  . Norvasc [Amlodipine] Swelling    edema  . Penicillins  Other (See Comments)    Unknown childhood reaction - able to tolerate cephalosporins  . Metformin And Related     malaise    Current Outpatient Medications  Medication Sig Dispense Refill  . carvedilol (COREG) 25 MG tablet TAKE ONE TABLET BY MOUTH TWICE A DAY WITH A MEAL 180 tablet 3  . gabapentin (NEURONTIN) 100 MG capsule Take 1 capsule (100 mg total) by mouth at bedtime. 90 capsule 3  . glucose blood (BAYER CONTOUR NEXT TEST) test strip Use to check blood sugar three times a day or as directed. 100 each 12  . ibuprofen (ADVIL) 400 MG tablet Take by mouth daily as needed.    . Insulin Glargine (BASAGLAR KWIKPEN) 100 UNIT/ML Inject 0.28 mLs (28 Units total) into the skin daily. 9 mL 0  . Insulin Pen Needle (TECHLITE PEN NEEDLES) 32G X 6 MM MISC Use to inject insulin daily as instructed 100 each 5  . Lancets MISC Use Contour Next Lancet to check blood glucose 3 times a day.  Dx code E11.65. 100 each 12  . linaclotide (LINZESS) 145 MCG CAPS capsule Take 1 capsule (145 mcg total) by mouth daily. 30 capsule 5  . losartan-hydrochlorothiazide (HYZAAR) 100-25 MG tablet TAKE ONE TABLET BY MOUTH DAILY 90 tablet 3  . metFORMIN (GLUCOPHAGE) 500 MG tablet TAKE ONE TABLET BY MOUTH TWICE A DAY WITH A MEAL 60 tablet PRN  .  PEG-KCl-NaCl-NaSulf-Na Asc-C (PLENVU) 140 g SOLR Take 1 kit by mouth as directed. Use coupon: BIN: [id number] PNC: CNRX Group: [id number] ID: [id number] 1 each 0  . Prenatal Vit-Fe Fum-Fe Bisg-FA (NATACHEW) 28-1 MG CHEW Chew 1 tablet by mouth at bedtime.    . Semaglutide,0.25 or 0.5MG/DOS, (OZEMPIC, 0.25 OR 0.5 MG/DOSE,) 2 MG/1.5ML SOPN Inject 0.375 mLs (0.5 mg total) into the skin once a week.    . Sodium Sulfate-Mag Sulfate-KCl (SUTAB) [phone] MG TABS Take 1 kit by mouth as directed. MANUFACTURER CODES!! BIN: [id number] PCN: CN GROUP: [id number] MEMBER ID: [id number];CWC AS CASH;NO PRIOR AUTHORIZATION 24 tablet 0  . spironolactone (ALDACTONE) 25 MG tablet TAKE ONE TABLET BY MOUTH DAILY 90 tablet 3  . Syringe, Disposable, 1 ML MISC 1 each by Does not apply route 3 (three) times daily. 100 each 0   No current facility-administered medications for this visit.    REVIEW OF SYSTEMS:   Review of Systems  Constitutional: Positive for fatigue. negative for appetite change, chills, fever and unexpected weight change.  HENT: Negative for mouth sores, nosebleeds, sore throat and trouble swallowing.   Eyes: Negative for eye problems and icterus.  Respiratory: Negative for cough, hemoptysis, shortness of breath and wheezing.   Cardiovascular: Negative for chest pain and leg swelling.  Gastrointestinal: Negative for abdominal pain, constipation, diarrhea, nausea and vomiting.  Genitourinary: Negative for bladder incontinence, difficulty urinating, dysuria, frequency and hematuria.   Musculoskeletal: Negative for back pain, gait problem, neck pain and neck stiffness.  Skin: Negative for itching and rash.  Neurological: Positive for occasional lightheadedness.  Negative for dizziness, extremity weakness, gait problem, headaches, and seizures.  Hematological: Negative for adenopathy. Does not bruise/bleed easily.  Psychiatric/Behavioral: Negative for confusion, depression and sleep disturbance. The patient is  not nervous/anxious.     PHYSICAL EXAMINATION:  Blood pressure 138/78, pulse 89, temperature 98.6 F (37 C), temperature source Tympanic, resp. rate 13, height 5' 8"  (1.727 m), weight 238 lb 9.6 oz (108.2 kg), SpO2 100 %.  ECOG PERFORMANCE STATUS: 1  Physical Exam  Constitutional: Oriented to person, place,  and time and well-developed, well-nourished, and in no distress.  HENT:  Head: Normocephalic and atraumatic.  Mouth/Throat: Oropharynx is clear and moist. No oropharyngeal exudate.  Eyes: Conjunctivae are normal. Right eye exhibits no discharge. Left eye exhibits no discharge. No scleral icterus.  Neck: Normal range of motion. Neck supple.  Cardiovascular: Normal rate, regular rhythm, normal heart sounds and intact distal pulses.   Pulmonary/Chest: Effort normal and breath sounds normal. No respiratory distress. No wheezes. No rales.  Abdominal: Soft. Bowel sounds are normal. Exhibits no distension and no mass. There is no tenderness.  Musculoskeletal: Normal range of motion. Exhibits no edema.  Lymphadenopathy:    No cervical adenopathy.  Neurological: Alert and oriented to person, place, and time. Exhibits normal muscle tone. Gait normal. Coordination normal.  Skin: Skin is warm and dry. No rash noted. Not diaphoretic. No erythema. No pallor.  Psychiatric: Mood, memory and judgment normal.  Vitals reviewed.  LABORATORY DATA: Lab Results  Component Value Date   WBC 8.4 12/08/2019   HGB 10.9 (L) 12/08/2019   HCT 33.4 (L) 12/08/2019   MCV 82.5 12/08/2019   PLT 550.0 (H) 12/08/2019      Chemistry      Component Value Date/Time   NA 140 08/13/2019 1426   K 4.5 08/13/2019 1426   CL 105 08/13/2019 1426   CO2 22 08/13/2019 1426   BUN 14 08/13/2019 1426   CREATININE 1.23 (H) 08/13/2019 1426   CREATININE 0.99 01/25/2016 0933      Component Value Date/Time   CALCIUM 9.4 08/13/2019 1426   ALKPHOS 99 08/13/2019 1426   AST 16 08/13/2019 1426   ALT 18 08/13/2019 1426    BILITOT 0.2 08/13/2019 1426       RADIOGRAPHIC STUDIES: No results found.  ASSESSMENT: This is a very pleasant 49 year old African American female with iron deficiency anemia   PLAN: The patient was seen with Dr. Julien Nordmann today.  The patient had several lab studies performed including a CBC, CMP, iron studies, ferritin, SPEP with immunofixation, vitamin B12, and folate.  Her CBC shows persistent but improving anemia with a hemoglobin of 11.3.  Her MCV is 82.7.  Patient CMP is mostly unremarkable except her creatinine is elevated at 1.43.  Her ferritin is the low end of normal at 25 which is improved compared to 13 2 weeks ago. The patient's iron studies are improving as well.  Her iron is 44 compared to 19 2 weeks ago. Her saturation is improved from 4.3% to 11%.  Her folate and B12 are unremarkable. Her SPEP with immunofixation is still pending at this time.  Dr. Julien Nordmann gave the patient the option of continuing on oral iron supplements. The patient cannot tolerate her iron supplements and the prescription is too expensive for the patient. She is interested in IV iron.   I will arrange for IV iron for the patient with venofer weekly x4.   We will see the patient back for follow-up visit in 2-3 months for evaluation repeat CBC, iron studies, and ferritin.  Colonoscopy and EGD on 01/12/20 as scheduled.   The patient voices understanding of current disease status and treatment options and is in agreement with the current care plan.  All questions were answered. The patient knows to call the clinic with any problems, questions or concerns. We can certainly see the patient much sooner if necessary.  Thank you so much for allowing me to participate in the care of Abbigail N Lesnick. I will continue to  follow up the patient with you and assist in her care.  The total time spent in the appointment was 60 minutes.  Disclaimer: This note was dictated with voice recognition software. Similar  sounding words can inadvertently be transcribed and may not be corrected upon review.   Sakiya Stepka L Shonya Sumida December 27, 2019, 11:34 AM  ADDENDUM: Hematology/Oncology Attending: I had a face-to-face encounter with the patient today.  I recommended her care plan.  This is a very pleasant 49 years old African-American female with iron deficiency anemia secondary to dietary deficiency and also menorrhagia.  The patient has intolerance to the oral iron tablets.  She was seen by her primary care physician and blood work showed significant iron deficiency but the patient started over-the-counter iron preparation and frequently over the last 2 weeks because she could not afford the prescription iron tablets.  Her blood work today showed already improvement in her iron study as well as hemoglobin and hematocrit. I discussed with the patient the option of continuing with the oral iron tablet and close monitoring but she is concerned about her intolerability to the oral iron tablet and she will not be consistent with taking the medication. I gave her the other option of intravenous iron infusion with Venofer weekly for 4 weeks. The patient is interested in this option and we will see her back for follow-up visit in 2-3 months for evaluation and repeat CBC, iron study and ferritin. The patient was advised to call immediately if she has any other concerning symptoms in the interval.  Disclaimer: This note was dictated with voice recognition software. Similar sounding words can inadvertently be transcribed and may be missed upon review. Eilleen Kempf, MD 12/28/19

## 2019-12-28 ENCOUNTER — Encounter: Payer: Self-pay | Admitting: Physician Assistant

## 2019-12-28 ENCOUNTER — Other Ambulatory Visit: Payer: Self-pay | Admitting: Physician Assistant

## 2019-12-28 ENCOUNTER — Other Ambulatory Visit: Payer: Self-pay

## 2019-12-28 ENCOUNTER — Inpatient Hospital Stay: Payer: 59 | Attending: Physician Assistant

## 2019-12-28 ENCOUNTER — Inpatient Hospital Stay (HOSPITAL_BASED_OUTPATIENT_CLINIC_OR_DEPARTMENT_OTHER): Payer: 59 | Admitting: Physician Assistant

## 2019-12-28 VITALS — BP 138/78 | HR 89 | Temp 98.6°F | Resp 13 | Ht 68.0 in | Wt 238.6 lb

## 2019-12-28 DIAGNOSIS — E1122 Type 2 diabetes mellitus with diabetic chronic kidney disease: Secondary | ICD-10-CM | POA: Diagnosis not present

## 2019-12-28 DIAGNOSIS — I129 Hypertensive chronic kidney disease with stage 1 through stage 4 chronic kidney disease, or unspecified chronic kidney disease: Secondary | ICD-10-CM | POA: Diagnosis not present

## 2019-12-28 DIAGNOSIS — G4733 Obstructive sleep apnea (adult) (pediatric): Secondary | ICD-10-CM | POA: Insufficient documentation

## 2019-12-28 DIAGNOSIS — K5909 Other constipation: Secondary | ICD-10-CM | POA: Insufficient documentation

## 2019-12-28 DIAGNOSIS — Z79899 Other long term (current) drug therapy: Secondary | ICD-10-CM | POA: Insufficient documentation

## 2019-12-28 DIAGNOSIS — D509 Iron deficiency anemia, unspecified: Secondary | ICD-10-CM

## 2019-12-28 DIAGNOSIS — E785 Hyperlipidemia, unspecified: Secondary | ICD-10-CM | POA: Diagnosis not present

## 2019-12-28 DIAGNOSIS — N183 Chronic kidney disease, stage 3 unspecified: Secondary | ICD-10-CM | POA: Insufficient documentation

## 2019-12-28 DIAGNOSIS — Z794 Long term (current) use of insulin: Secondary | ICD-10-CM | POA: Insufficient documentation

## 2019-12-28 DIAGNOSIS — Z7982 Long term (current) use of aspirin: Secondary | ICD-10-CM | POA: Diagnosis not present

## 2019-12-28 LAB — FERRITIN: Ferritin: 25 ng/mL (ref 11–307)

## 2019-12-28 LAB — CMP (CANCER CENTER ONLY)
ALT: 16 U/L (ref 0–44)
AST: 15 U/L (ref 15–41)
Albumin: 3.7 g/dL (ref 3.5–5.0)
Alkaline Phosphatase: 94 U/L (ref 38–126)
Anion gap: 10 (ref 5–15)
BUN: 21 mg/dL — ABNORMAL HIGH (ref 6–20)
CO2: 23 mmol/L (ref 22–32)
Calcium: 10.3 mg/dL (ref 8.9–10.3)
Chloride: 102 mmol/L (ref 98–111)
Creatinine: 1.43 mg/dL — ABNORMAL HIGH (ref 0.44–1.00)
GFR, Estimated: 45 mL/min — ABNORMAL LOW (ref >60)
Glucose, Bld: 210 mg/dL — ABNORMAL HIGH (ref 70–99)
Potassium: 4.2 mmol/L (ref 3.5–5.1)
Sodium: 135 mmol/L (ref 135–145)
Total Bilirubin: 0.5 mg/dL (ref 0.3–1.2)
Total Protein: 8.6 g/dL — ABNORMAL HIGH (ref 6.5–8.1)

## 2019-12-28 LAB — CBC WITH DIFFERENTIAL (CANCER CENTER ONLY)
Abs Immature Granulocytes: 0.03 10*3/uL (ref 0.00–0.07)
Basophils Absolute: 0.1 10*3/uL (ref 0.0–0.1)
Basophils Relative: 1 %
Eosinophils Absolute: 0.4 10*3/uL (ref 0.0–0.5)
Eosinophils Relative: 5 %
HCT: 35 % — ABNORMAL LOW (ref 36.0–46.0)
Hemoglobin: 11.3 g/dL — ABNORMAL LOW (ref 12.0–15.0)
Immature Granulocytes: 0 %
Lymphocytes Relative: 19 %
Lymphs Abs: 1.5 10*3/uL (ref 0.7–4.0)
MCH: 26.7 pg (ref 26.0–34.0)
MCHC: 32.3 g/dL (ref 30.0–36.0)
MCV: 82.7 fL (ref 80.0–100.0)
Monocytes Absolute: 0.4 10*3/uL (ref 0.1–1.0)
Monocytes Relative: 6 %
Neutro Abs: 5.4 10*3/uL (ref 1.7–7.7)
Neutrophils Relative %: 69 %
Platelet Count: 300 10*3/uL (ref 150–400)
RBC: 4.23 MIL/uL (ref 3.87–5.11)
RDW: 14.5 % (ref 11.5–15.5)
WBC Count: 7.7 10*3/uL (ref 4.0–10.5)
nRBC: 0 % (ref 0.0–0.2)

## 2019-12-28 LAB — IRON AND TIBC
Iron: 44 ug/dL (ref 41–142)
Saturation Ratios: 11 % — ABNORMAL LOW (ref 21–57)
TIBC: 418 ug/dL (ref 236–444)
UIBC: 373 ug/dL (ref 120–384)

## 2019-12-28 LAB — FOLATE: Folate: 21 ng/mL (ref >5.9)

## 2019-12-28 LAB — VITAMIN B12: Vitamin B-12: 404 pg/mL (ref 180–914)

## 2019-12-30 LAB — PROTEIN ELECTROPHORESIS, SERUM, WITH REFLEX
A/G Ratio: 0.8 (ref 0.7–1.7)
Albumin ELP: 3.6 g/dL (ref 2.9–4.4)
Alpha-1-Globulin: 0.2 g/dL (ref 0.0–0.4)
Alpha-2-Globulin: 1 g/dL (ref 0.4–1.0)
Beta Globulin: 1.4 g/dL — ABNORMAL HIGH (ref 0.7–1.3)
Gamma Globulin: 1.7 g/dL (ref 0.4–1.8)
Globulin, Total: 4.3 g/dL — ABNORMAL HIGH (ref 2.2–3.9)
Total Protein ELP: 7.9 g/dL (ref 6.0–8.5)

## 2020-01-04 ENCOUNTER — Encounter: Payer: Self-pay | Admitting: Internal Medicine

## 2020-01-05 ENCOUNTER — Telehealth: Payer: Self-pay | Admitting: Internal Medicine

## 2020-01-05 NOTE — Telephone Encounter (Signed)
Called and left msg about upcoming appts

## 2020-01-06 ENCOUNTER — Other Ambulatory Visit: Payer: Self-pay

## 2020-01-06 ENCOUNTER — Inpatient Hospital Stay: Payer: 59 | Attending: Physician Assistant

## 2020-01-06 VITALS — BP 141/94 | HR 79 | Temp 98.3°F | Resp 18

## 2020-01-06 DIAGNOSIS — Z79899 Other long term (current) drug therapy: Secondary | ICD-10-CM | POA: Diagnosis not present

## 2020-01-06 DIAGNOSIS — D509 Iron deficiency anemia, unspecified: Secondary | ICD-10-CM | POA: Diagnosis present

## 2020-01-06 MED ORDER — DIPHENHYDRAMINE HCL 25 MG PO CAPS
ORAL_CAPSULE | ORAL | Status: AC
  Filled 2020-01-06: qty 2

## 2020-01-06 MED ORDER — SODIUM CHLORIDE 0.9 % IV SOLN
200.0000 mg | Freq: Once | INTRAVENOUS | Status: AC
  Administered 2020-01-06: 200 mg via INTRAVENOUS
  Filled 2020-01-06: qty 200

## 2020-01-06 MED ORDER — ACETAMINOPHEN 325 MG PO TABS
650.0000 mg | ORAL_TABLET | Freq: Once | ORAL | Status: AC
  Administered 2020-01-06: 650 mg via ORAL

## 2020-01-06 MED ORDER — ACETAMINOPHEN 325 MG PO TABS
ORAL_TABLET | ORAL | Status: AC
  Filled 2020-01-06: qty 2

## 2020-01-06 MED ORDER — DIPHENHYDRAMINE HCL 25 MG PO CAPS
ORAL_CAPSULE | ORAL | Status: AC
  Filled 2020-01-06: qty 1

## 2020-01-06 MED ORDER — DIPHENHYDRAMINE HCL 25 MG PO CAPS
50.0000 mg | ORAL_CAPSULE | Freq: Once | ORAL | Status: AC
  Administered 2020-01-06: 50 mg via ORAL

## 2020-01-06 MED ORDER — SODIUM CHLORIDE 0.9 % IV SOLN
Freq: Once | INTRAVENOUS | Status: AC
  Filled 2020-01-06: qty 250

## 2020-01-06 NOTE — Progress Notes (Signed)
Patient ambulated from facility in stable condition with no complaints

## 2020-01-06 NOTE — Patient Instructions (Signed)

## 2020-01-07 ENCOUNTER — Telehealth: Payer: Self-pay

## 2020-01-07 NOTE — Telephone Encounter (Signed)
-----  Message from [place], PA-C sent at 01/05/2020  2:55 PM EST ----- Can you let her know that Dr. Julien Nordmann and I reviewed her SPEP. Can you reassure her that it is not concerning for anything bad such as multiple myeloma. Her labs are slightly off likely because of another process such as high cholesterol and Dr. Julien Nordmann recommends that she follow up with her PCP for management and monitoring of her routine blood work such as lipids. ----- Message ----- From: Interface, Lab In [place] Sent: 12/28/2019   1:19 PM EST To: Cassandra L Heilingoetter, PA-C

## 2020-01-07 NOTE — Telephone Encounter (Signed)
I spoke with pt and advised as indicated. Pt expressed understanding of this information.

## 2020-01-12 ENCOUNTER — Telehealth: Payer: Self-pay | Admitting: Internal Medicine

## 2020-01-12 ENCOUNTER — Other Ambulatory Visit: Payer: Self-pay

## 2020-01-12 ENCOUNTER — Encounter: Payer: Self-pay | Admitting: Internal Medicine

## 2020-01-12 ENCOUNTER — Ambulatory Visit (AMBULATORY_SURGERY_CENTER): Payer: 59 | Admitting: Internal Medicine

## 2020-01-12 VITALS — BP 165/93 | HR 82 | Temp 98.6°F | Resp 10 | Ht 68.0 in | Wt 238.0 lb

## 2020-01-12 DIAGNOSIS — Z1211 Encounter for screening for malignant neoplasm of colon: Secondary | ICD-10-CM

## 2020-01-12 DIAGNOSIS — K5909 Other constipation: Secondary | ICD-10-CM

## 2020-01-12 DIAGNOSIS — K253 Acute gastric ulcer without hemorrhage or perforation: Secondary | ICD-10-CM

## 2020-01-12 DIAGNOSIS — K219 Gastro-esophageal reflux disease without esophagitis: Secondary | ICD-10-CM | POA: Diagnosis not present

## 2020-01-12 DIAGNOSIS — K2970 Gastritis, unspecified, without bleeding: Secondary | ICD-10-CM

## 2020-01-12 DIAGNOSIS — D509 Iron deficiency anemia, unspecified: Secondary | ICD-10-CM | POA: Diagnosis present

## 2020-01-12 DIAGNOSIS — Z800 Family history of malignant neoplasm of digestive organs: Secondary | ICD-10-CM

## 2020-01-12 DIAGNOSIS — D122 Benign neoplasm of ascending colon: Secondary | ICD-10-CM

## 2020-01-12 DIAGNOSIS — K259 Gastric ulcer, unspecified as acute or chronic, without hemorrhage or perforation: Secondary | ICD-10-CM

## 2020-01-12 MED ORDER — SODIUM CHLORIDE 0.9 % IV SOLN: 500.0000 mL | Freq: Once | INTRAVENOUS | Status: DC

## 2020-01-12 MED ORDER — OMEPRAZOLE 20 MG PO CPDR: 20.0000 mg | DELAYED_RELEASE_CAPSULE | Freq: Every day | ORAL | 11 refills | Status: DC

## 2020-01-12 NOTE — Progress Notes (Signed)
On arrival to PACU, the patient reported her bottom lip hurting.  On inspection, the inner mucosa has a blister from the bite block placed for the upper endoscopy.  RN encouraged the patient to use ice and OTC  Oragel to help with the discomfort as it heals.  MD and CRNA made aware.

## 2020-01-12 NOTE — Progress Notes (Signed)
VS by CW. ?

## 2020-01-12 NOTE — Op Note (Signed)
[facility] Patient Name: Alicia West Procedure Date: 01/12/2020 2:55 PM MRN: [id number] Endoscopist: Docia Chuck. Henrene Pastor , MD Age: 49 Referring MD:  Date of Birth: 17-Aug-1970 Gender: Female Account #: [id number] Procedure:                Colonoscopy with cold snare polypectomy x 1 Indications:              Screening in patient at increased risk: Colorectal                            cancer in father 50 or older. Index exam. Chronic                            iron deficiency. Chronic constipation improved on                            Linzess 145 mcg daily Medicines:                Monitored Anesthesia Care Procedure:                Pre-Anesthesia Assessment:                           - Prior to the procedure, a History and Physical                            was performed, and patient medications and                            allergies were reviewed. The patient's tolerance of                            previous anesthesia was also reviewed. The risks                            and benefits of the procedure and the sedation                            options and risks were discussed with the patient.                            All questions were answered, and informed consent                            was obtained. Prior Anticoagulants: The patient has                            taken no previous anticoagulant or antiplatelet                            agents. ASA Grade Assessment: II - A patient with                            mild systemic disease. After reviewing the risks  and benefits, the patient was deemed in                            satisfactory condition to undergo the procedure.                           After obtaining informed consent, the colonoscope                            was passed under direct vision. Throughout the                            procedure, the patient's blood pressure, pulse, and                            oxygen  saturations were monitored continuously. The                            Colonoscope was introduced through the anus and                            advanced to the the cecum, identified by                            appendiceal orifice and ileocecal valve. The                            ileocecal valve, appendiceal orifice, and rectum                            were photographed. The quality of the bowel                            preparation was excellent. The colonoscopy was                            performed without difficulty. The patient tolerated                            the procedure well. The bowel preparation used was                            SUPREP via split dose instruction. Scope In: 3:11:49 PM Scope Out: 3:25:11 PM Scope Withdrawal Time: 0 hours 11 minutes 27 seconds  Total Procedure Duration: 0 hours 13 minutes 22 seconds  Findings:                 A 2 mm polyp was found in the ascending colon. The                            polyp was removed with a cold snare. Resection and                            retrieval were complete.  The exam was otherwise without abnormality on                            direct and retroflexion views. Complications:            No immediate complications. Estimated blood loss:                            None. Estimated Blood Loss:     Estimated blood loss: none. Impression:               - One 2 mm polyp in the ascending colon, removed                            with a cold snare. Resected and retrieved.                           - The examination was otherwise normal on direct                            and retroflexion views. Recommendation:           - Repeat colonoscopy in 5 years for surveillance.                           - Patient has a contact number available for                            emergencies. The signs and symptoms of potential                            delayed complications were discussed with the                             patient. Return to normal activities tomorrow.                            Written discharge instructions were provided to the                            patient.                           - Resume previous diet.                           - Continue present medications.                           - Await pathology results. Docia Chuck. Henrene Pastor, MD 01/12/2020 3:29:31 PM This report has been signed electronically.

## 2020-01-12 NOTE — Progress Notes (Signed)
Called to room to assist during endoscopic procedure.  Patient ID and intended procedure confirmed with present staff. Received instructions for my participation in the procedure from the performing physician.  

## 2020-01-12 NOTE — Telephone Encounter (Signed)
Scheduled apt per 12/7 shc msg - unable to reach pt . Left message for patient with appt date and time

## 2020-01-12 NOTE — Progress Notes (Signed)
PT taken to PACU. Monitors in place. VSS. Report given to RN. 

## 2020-01-12 NOTE — Op Note (Signed)
[facility] Patient Name: Alicia West Procedure Date: 01/12/2020 2:54 PM MRN: [id number] Endoscopist: Docia Chuck. Henrene Pastor , MD Age: 49 Referring MD:  Date of Birth: 1970-11-21 Gender: Female Account #: [id number] Procedure:                Upper GI endoscopy with biopsies Indications:              Iron deficiency anemia, Heartburn Medicines:                Monitored Anesthesia Care Procedure:                Pre-Anesthesia Assessment:                           - Prior to the procedure, a History and Physical                            was performed, and patient medications and                            allergies were reviewed. The patient's tolerance of                            previous anesthesia was also reviewed. The risks                            and benefits of the procedure and the sedation                            options and risks were discussed with the patient.                            All questions were answered, and informed consent                            was obtained. Prior Anticoagulants: The patient has                            taken no previous anticoagulant or antiplatelet                            agents. ASA Grade Assessment: II - A patient with                            mild systemic disease. After reviewing the risks                            and benefits, the patient was deemed in                            satisfactory condition to undergo the procedure.                           After obtaining informed consent, the endoscope was  passed under direct vision. Throughout the                            procedure, the patient's blood pressure, pulse, and                            oxygen saturations were monitored continuously. The                            Endoscope was introduced through the mouth, and                            advanced to the second part of duodenum. The upper                            GI  endoscopy was accomplished without difficulty.                            The patient tolerated the procedure well. Scope In: Scope Out: Findings:                 The esophagus revealed mild esophagitis as                            manifested by erythema and edema at the mucosal                            Z-line. The esophagus was otherwise normal.                           The stomach revealed small sliding hiatal hernia.                            In addition a few small prepyloric antral erosions.                            Biopsies were taken with a cold forceps for                            histology.                           The examined duodenum was normal.                           The cardia and gastric fundus were normal on                            retroflexion. Complications:            No immediate complications. Estimated Blood Loss:     Estimated blood loss: none. Impression:               1. GERD with mild esophagitis  2. Few antral erosions status post biopsies                           3. Otherwise unremarkable EGD. Recommendation:           1. Reflux precautions                           2. Prescribe omeprazole 20 mg daily; #30; 11                            refills. Take 1 each morning 30 to 60 minutes                            before your first meal                           3. Follow-up biopsies                           4. Return to the care of your primary provider. GI                            follow-up as needed. Docia Chuck. Henrene Pastor, MD 01/12/2020 3:39:42 PM This report has been signed electronically.

## 2020-01-12 NOTE — Patient Instructions (Signed)
HANDOUTS PROVIDED ON: ESOPHAGITIS & POLYPS  The polyps removed/biopsies taken today have been sent for pathology.  The results can take 1-3 weeks to receive.  When your next colonoscopy should occur will be based on the pathology results.    You may resume your previous diet and medication schedule.  A prescription for omeprazole 13m has been sent to your pharmacy.  You should take this medication 30-60 minutes before the first meal of the day. Thank you for allowing [facility] for you today!!!   YOU HAD AN ENDOSCOPIC PROCEDURE TODAY AT [facility]   Refer to the procedure report that was given to you for any specific questions about what was found during the examination.  If the procedure report does not answer your questions, please call your gastroenterologist to clarify.  If you requested that your care partner not be given the details of your procedure findings, then the procedure report has been included in a sealed envelope for you to review at your convenience later.  YOU SHOULD EXPECT: Some feelings of bloating in the abdomen. Passage of more gas than usual.  Walking can help get rid of the air that was put into your GI tract during the procedure and reduce the bloating. If you had a lower endoscopy (such as a colonoscopy or flexible sigmoidoscopy) you may notice spotting of blood in your stool or on the toilet paper. If you underwent a bowel prep for your procedure, you may not have a normal bowel movement for a few days.  Please Note:  You might notice some irritation and congestion in your nose or some drainage.  This is from the oxygen used during your procedure.  There is no need for concern and it should clear up in a day or so.  SYMPTOMS TO REPORT IMMEDIATELY:   Following lower endoscopy (colonoscopy or flexible sigmoidoscopy):  Excessive amounts of blood in the stool  Significant tenderness or worsening of abdominal pains  Swelling of the abdomen that is new,  acute  Fever of 100F or higher   Following upper endoscopy (EGD)  Vomiting of blood or coffee ground material  New chest pain or pain under the shoulder blades  Painful or persistently difficult swallowing  New shortness of breath  Fever of 100F or higher  Black, tarry-looking stools  For urgent or emergent issues, a gastroenterologist can be reached at any hour by calling ([phone] Do not use MyChart messaging for urgent concerns.    DIET:  We do recommend a small meal at first, but then you may proceed to your regular diet.  Drink plenty of fluids but you should avoid alcoholic beverages for 24 hours.  ACTIVITY:  You should plan to take it easy for the rest of today and you should NOT DRIVE or use heavy machinery until tomorrow (because of the sedation medicines used during the test).    FOLLOW UP: Our staff will call the number listed on your records Thursday morning between 7:15 am and 8:15 am to check on you and address any questions or concerns that you may have regarding the information given to you following your procedure. If we do not reach you, we will leave a message.  We will attempt to reach you two times.  During this call, we will ask if you have developed any symptoms of COVID 19. If you develop any symptoms (ie: fever, flu-like symptoms, shortness of breath, cough etc.) before then, please call [phone]  If  you test positive for Covid 19 in the 2 weeks post procedure, please call and report this information to [place].    If any biopsies were taken you will be contacted by phone or by letter within the next 1-3 weeks.  Please call us at [phone] if you have not heard about the biopsies in 3 weeks.    SIGNATURES/CONFIDENTIALITY: You and/or your care partner have signed paperwork which will be entered into your electronic medical record.  These signatures attest to the fact that that the information above on your After Visit Summary has been reviewed and is  understood.  Full responsibility of the confidentiality of this discharge information lies with you and/or your care-partner.

## 2020-01-14 ENCOUNTER — Ambulatory Visit: Payer: 59

## 2020-01-14 ENCOUNTER — Inpatient Hospital Stay: Payer: 59

## 2020-01-14 ENCOUNTER — Telehealth: Payer: Self-pay | Admitting: *Deleted

## 2020-01-14 NOTE — Telephone Encounter (Signed)
1. Have you developed a fever since your procedure? no  2.   Have you had an respiratory symptoms (SOB or cough) since your procedure? no  3.   Have you tested positive for COVID 19 since your procedure no  4.   Have you had any family members/close contacts diagnosed with the COVID 19 since your procedure?  no   If yes to any of these questions please route to Joylene John, RN and Joella Prince, RN Follow up Call-  Call back number 01/12/2020  Post procedure Call Back phone  # [phone]  Permission to leave phone message Yes  Some recent data might be hidden     Patient questions:  Do you have a fever, pain , or abdominal swelling? No. Pain Score  0 *  Have you tolerated food without any problems? Yes.    Have you been able to return to your normal activities? Yes.    Do you have any questions about your discharge instructions: Diet   No. Medications  No. Follow up visit  No.  Do you have questions or concerns about your Care? No.  Actions: * If pain score is 4 or above: No action needed, pain <4.

## 2020-01-18 ENCOUNTER — Encounter: Payer: Self-pay | Admitting: Family Medicine

## 2020-01-18 ENCOUNTER — Encounter: Payer: Self-pay | Admitting: Internal Medicine

## 2020-01-18 DIAGNOSIS — D126 Benign neoplasm of colon, unspecified: Secondary | ICD-10-CM | POA: Insufficient documentation

## 2020-01-20 ENCOUNTER — Encounter: Payer: Self-pay | Admitting: Family Medicine

## 2020-01-20 DIAGNOSIS — K2100 Gastro-esophageal reflux disease with esophagitis, without bleeding: Secondary | ICD-10-CM

## 2020-01-20 HISTORY — DX: Gastro-esophageal reflux disease with esophagitis, without bleeding: K21.00

## 2020-01-21 ENCOUNTER — Ambulatory Visit: Payer: 59

## 2020-01-21 ENCOUNTER — Inpatient Hospital Stay: Payer: 59 | Admitting: Internal Medicine

## 2020-01-26 ENCOUNTER — Telehealth: Payer: Self-pay

## 2020-01-26 NOTE — Telephone Encounter (Signed)
Patient presents to clinic for Ozempic samples. Verified name and birth date and provided samples to patient.   Talbot Grumbling, RN

## 2020-01-26 NOTE — Telephone Encounter (Signed)
Patient LVM on  nurse line requesting samples on Ozempic. Patient has not been seen for diabetes follow up with A1c since 07/29/2019. Patient was previously scheduled on 11/11, however, had to cancel appointment.   Please advise if samples can be provided.   Talbot Grumbling, RN

## 2020-01-26 NOTE — Telephone Encounter (Signed)
I am covering for Dr McDiarmid this week, okay to provide samples but please also have patient schedule follow up in next 2-3 weeks for diabetes check as we may need to adjust medications.  Thanks, Yehuda Savannah MD

## 2020-01-26 NOTE — Telephone Encounter (Signed)
Called patient and scheduled for next available appointment with Dr. McDiarmid on 02/25/2020. 2 Ozempic pens placed in medication refrigerator. Patient states that she will pick up this afternoon.   Drug name: Ozempic       Strength: 0.5 mg        Qty: 2 pens     LOT: [id number]  Exp.Date: 12/05/2021  Dosing instructions: Inject 0.5 mg into skin once weekly.    Alicia West [name] 2:08 PM 01/26/2020

## 2020-01-28 ENCOUNTER — Ambulatory Visit: Payer: 59

## 2020-02-25 ENCOUNTER — Ambulatory Visit (INDEPENDENT_AMBULATORY_CARE_PROVIDER_SITE_OTHER): Payer: 59 | Admitting: Family Medicine

## 2020-02-25 ENCOUNTER — Other Ambulatory Visit: Payer: Self-pay

## 2020-02-25 ENCOUNTER — Encounter: Payer: Self-pay | Admitting: Family Medicine

## 2020-02-25 VITALS — BP 120/70 | HR 99 | Ht 68.0 in | Wt 241.5 lb

## 2020-02-25 DIAGNOSIS — E1169 Type 2 diabetes mellitus with other specified complication: Secondary | ICD-10-CM

## 2020-02-25 DIAGNOSIS — E113413 Type 2 diabetes mellitus with severe nonproliferative diabetic retinopathy with macular edema, bilateral: Secondary | ICD-10-CM

## 2020-02-25 DIAGNOSIS — E11311 Type 2 diabetes mellitus with unspecified diabetic retinopathy with macular edema: Secondary | ICD-10-CM

## 2020-02-25 DIAGNOSIS — R109 Unspecified abdominal pain: Secondary | ICD-10-CM

## 2020-02-25 DIAGNOSIS — G8929 Other chronic pain: Secondary | ICD-10-CM

## 2020-02-25 DIAGNOSIS — E1149 Type 2 diabetes mellitus with other diabetic neurological complication: Secondary | ICD-10-CM

## 2020-02-25 DIAGNOSIS — R944 Abnormal results of kidney function studies: Secondary | ICD-10-CM | POA: Diagnosis not present

## 2020-02-25 DIAGNOSIS — R400 Somnolence: Secondary | ICD-10-CM

## 2020-02-25 DIAGNOSIS — E8809 Other disorders of plasma-protein metabolism, not elsewhere classified: Secondary | ICD-10-CM

## 2020-02-25 DIAGNOSIS — L700 Acne vulgaris: Secondary | ICD-10-CM

## 2020-02-25 DIAGNOSIS — D508 Other iron deficiency anemias: Secondary | ICD-10-CM | POA: Diagnosis not present

## 2020-02-25 DIAGNOSIS — N1831 Chronic kidney disease, stage 3a: Secondary | ICD-10-CM

## 2020-02-25 DIAGNOSIS — E1159 Type 2 diabetes mellitus with other circulatory complications: Secondary | ICD-10-CM

## 2020-02-25 DIAGNOSIS — G4733 Obstructive sleep apnea (adult) (pediatric): Secondary | ICD-10-CM

## 2020-02-25 DIAGNOSIS — H542X22 Low vision right eye category 2, low vision left eye category 2: Secondary | ICD-10-CM

## 2020-02-25 DIAGNOSIS — I152 Hypertension secondary to endocrine disorders: Secondary | ICD-10-CM

## 2020-02-25 DIAGNOSIS — E7800 Pure hypercholesterolemia, unspecified: Secondary | ICD-10-CM

## 2020-02-25 DIAGNOSIS — E1165 Type 2 diabetes mellitus with hyperglycemia: Secondary | ICD-10-CM | POA: Diagnosis not present

## 2020-02-25 DIAGNOSIS — H3530 Unspecified macular degeneration: Secondary | ICD-10-CM

## 2020-02-25 LAB — POCT GLYCOSYLATED HEMOGLOBIN (HGB A1C): Hemoglobin A1C: 8.6 % — AB (ref 4.0–5.6)

## 2020-02-25 MED ORDER — SEMAGLUTIDE (1 MG/DOSE) 4 MG/3ML ~~LOC~~ SOPN: 1.0000 mg | PEN_INJECTOR | SUBCUTANEOUS | 5 refills | Status: DC

## 2020-02-25 MED ORDER — ATORVASTATIN CALCIUM 40 MG PO TABS: 40.0000 mg | ORAL_TABLET | ORAL | 3 refills | Status: DC

## 2020-02-25 NOTE — Patient Instructions (Signed)
Your A1c was up slightly. If you are able, change to the 1 mg dose semaglutide injection pen.  If not, stay with the 0.5 mg injection pen.  Please see Dr Valentina Lucks in 6 weeks, then Dr Sabryn Preslar 6 weeks after that.

## 2020-02-26 ENCOUNTER — Encounter: Payer: Self-pay | Admitting: Family Medicine

## 2020-02-26 DIAGNOSIS — N1831 Chronic kidney disease, stage 3a: Secondary | ICD-10-CM

## 2020-02-26 HISTORY — DX: Chronic kidney disease, stage 3a: N18.31

## 2020-02-26 LAB — CMP14+EGFR
ALT: 18 IU/L (ref 0–32)
AST: 12 IU/L (ref 0–40)
Albumin/Globulin Ratio: 1.2 (ref 1.2–2.2)
Albumin: 4.1 g/dL (ref 3.8–4.8)
Alkaline Phosphatase: 92 IU/L (ref 44–121)
BUN/Creatinine Ratio: 14 (ref 9–23)
BUN: 19 mg/dL (ref 6–24)
Bilirubin Total: 0.3 mg/dL (ref 0.0–1.2)
CO2: 21 mmol/L (ref 20–29)
Calcium: 10.4 mg/dL — ABNORMAL HIGH (ref 8.7–10.2)
Chloride: 101 mmol/L (ref 96–106)
Creatinine, Ser: 1.35 mg/dL — ABNORMAL HIGH (ref 0.57–1.00)
GFR calc Af Amer: 53 mL/min/{1.73_m2} — ABNORMAL LOW (ref >59)
GFR calc non Af Amer: 46 mL/min/{1.73_m2} — ABNORMAL LOW (ref >59)
Globulin, Total: 3.4 g/dL (ref 1.5–4.5)
Glucose: 263 mg/dL — ABNORMAL HIGH (ref 65–99)
Potassium: 4.6 mmol/L (ref 3.5–5.2)
Sodium: 135 mmol/L (ref 134–144)
Total Protein: 7.5 g/dL (ref 6.0–8.5)

## 2020-02-26 LAB — CBC
Hematocrit: 34.9 % (ref 34.0–46.6)
Hemoglobin: 11.3 g/dL (ref 11.1–15.9)
MCH: 27.6 pg (ref 26.6–33.0)
MCHC: 32.4 g/dL (ref 31.5–35.7)
MCV: 85 fL (ref 79–97)
Platelets: 417 10*3/uL (ref 150–450)
RBC: 4.09 x10E6/uL (ref 3.77–5.28)
RDW: 15.2 % (ref 11.7–15.4)
WBC: 7.7 10*3/uL (ref 3.4–10.8)

## 2020-02-26 LAB — FERRITIN: Ferritin: 24 ng/mL (ref 15–150)

## 2020-02-26 NOTE — Assessment & Plan Note (Signed)
See macular degeneration A/P

## 2020-02-26 NOTE — Assessment & Plan Note (Signed)
Established problem Well Controlled. No signs of complications, medication side effects, or red flags. Continue current medications and other regiments.

## 2020-02-26 NOTE — Assessment & Plan Note (Signed)
Established problem. Adequate blood pressure control.  No evidence of new end organ damage.  Tolerating medication without significant adverse effects.  Plan to continue current blood pressure medication regiment.

## 2020-02-26 NOTE — Assessment & Plan Note (Signed)
Established problem Mack not fitting well.  Alicia West is going to meet with RT to address issue.

## 2020-02-26 NOTE — Assessment & Plan Note (Signed)
Established problem Uncontrolled CPAP mask not fitting well.  Alicia West is to she RT about mask

## 2020-02-26 NOTE — Assessment & Plan Note (Signed)
Established problem Alicia West has been unable to afford the Avastin therapy recommended by Dr Coralyn Pear.   She realizes the importance of addressing this issue to preserve her vision.  She plans to contact Dr Dairl Ponder office to reschedule an appointment.

## 2020-02-26 NOTE — Assessment & Plan Note (Addendum)
Lab Results  Component Value Date   CREATININE 1.35 (H) 02/25/2020   CREATININE 1.43 (H) 12/28/2019   CREATININE 1.23 (H) 08/13/2019  .microalb/cr = 9 WNL CKD-EPI 2021 = 48 ml/min

## 2020-02-26 NOTE — Progress Notes (Signed)
MIRREN GEST is alone Sources of clinical information for visit is/are patient, past medical records and Hematology notes and Neurology-Sleep notes. Nursing assessment for this office visit was reviewed with the patient for accuracy and revision.   Previous Report(s) Reviewed: historical medical records, lab reports, office notes and pathology: Colon polyp biopsy - tubular adenoma Depression screen [facility] 2/9 08/13/2019  Decreased Interest 0  Down, Depressed, Hopeless 0  PHQ - 2 Score 0  Altered sleeping -  Tired, decreased energy -  Change in appetite -  Feeling bad or failure about yourself  -  Trouble concentrating -  Moving slowly or fidgety/restless -  Suicidal thoughts -  PHQ-9 Score -  Some recent data might be hidden    Fall Risk  08/13/2019 01/28/2019 04/16/2018 01/31/2018 01/07/2018  Falls in the past year? 0 0 0 0 0  Number falls in past yr: 0 0 0 - 0  Injury with Fall? - - 0 - 0  Follow up Falls evaluation completed - Falls evaluation completed - -    PHQ9 SCORE ONLY 08/13/2019 07/29/2019 01/28/2019  PHQ-9 Total Score 0 19 3    Adult vaccines due  Topic Date Due   TETANUS/TDAP  05/29/2021   PNEUMOCOCCAL POLYSACCHARIDE VACCINE AGE 18-64 HIGH RISK  Completed    Health Maintenance Due  Topic Date Due   OPHTHALMOLOGY EXAM  09/26/2017   COVID-19 Vaccine (3 - Inadvertent risk 4-dose series) 05/25/2019      History/P.E. limitations: none  Adult vaccines due  Topic Date Due   TETANUS/TDAP  05/29/2021   PNEUMOCOCCAL POLYSACCHARIDE VACCINE AGE 18-64 HIGH RISK  Completed    Diabetes Health Maintenance Due  Topic Date Due   OPHTHALMOLOGY EXAM  09/26/2017   HEMOGLOBIN A1C  08/24/2020   FOOT EXAM  09/02/2020    Health Maintenance Due  Topic Date Due   OPHTHALMOLOGY EXAM  09/26/2017   COVID-19 Vaccine (3 - Inadvertent risk 4-dose series) 05/25/2019     Chief Complaint  Patient presents with   Follow-up    a1c    Diabetic Foot Exam - Simple   Simple  Foot Form Diabetic Foot exam was performed with the following findings: Yes 02/25/2020 11:29 AM  Visual Inspection No deformities, no ulcerations, no other skin breakdown bilaterally: Yes Sensation Testing Intact to touch and monofilament testing bilaterally: Yes Pulse Check Comments

## 2020-02-26 NOTE — Assessment & Plan Note (Signed)
Wt Readings from Last 3 Encounters:  02/25/20 241 lb 8 oz (109.5 kg)  01/12/20 238 lb (108 kg)  12/28/19 238 lb 9.6 oz (108.2 kg)   Will see if increase dose of semaglutide to 1 mg will help with weight loss.

## 2020-02-26 NOTE — Assessment & Plan Note (Signed)
Established problem. Stable.  Not progressing.   Lateral edge of paraspinal muscle bundle on right.

## 2020-02-26 NOTE — Assessment & Plan Note (Addendum)
Established problem whose glycemic control has worsened.  Lab Results  Component Value Date   HGBA1C 8.6 (A) 02/25/2020   HGBA1C 7.8 (H) 07/29/2019   HGBA1C 8.5 (A) 01/28/2019   Lab Results  Component Value Date   LDLCALC 139 (H) 10/16/2018   CREATININE 1.35 (H) 02/25/2020   Alicia West is unable to tolerate higher doses of metformin Her insulin was decreased from 30 to 28 units in july She is taking semaglutide 0.5 mg injections weekly.  She is obtaining samples from our office.    Plan to increase semaglutide to 1 mg inj  weekly.  She will try to purchase this dose of semaglutide.  Rx sent in.  The semaglutide out of pocket cost is ~ $500.  She will see if she can find fincances to pay it.   Alicia Killion will follow up with Pharmacy [facility] to help with re-establishing glycemic control.  She will follow up with [name] again in 3 months.

## 2020-02-26 NOTE — Assessment & Plan Note (Addendum)
Established problem Uncontrolled Ms Alicia West had a SPEP result last year that had sl elevated beta fraction that Dr Earlie Server thought could be due to hyperlipidemia. Ms Alicia West is willing to try a statin again to lower her cholesterol.    Ms Alicia West had myalgias previously with daily atorvastatin.  She is willing to try atorvastatin 40 mg twice a week.  Will recheck ldl in 3-6 months

## 2020-02-26 NOTE — Assessment & Plan Note (Signed)
Established problem See macular degeneration A/P

## 2020-03-08 ENCOUNTER — Ambulatory Visit: Payer: 59 | Admitting: Internal Medicine

## 2020-03-08 ENCOUNTER — Other Ambulatory Visit: Payer: 59

## 2020-03-30 ENCOUNTER — Other Ambulatory Visit: Payer: Self-pay | Admitting: Family Medicine

## 2020-03-30 DIAGNOSIS — E1149 Type 2 diabetes mellitus with other diabetic neurological complication: Secondary | ICD-10-CM

## 2020-04-02 ENCOUNTER — Other Ambulatory Visit: Payer: Self-pay | Admitting: Family Medicine

## 2020-04-02 DIAGNOSIS — I10 Essential (primary) hypertension: Secondary | ICD-10-CM

## 2020-04-15 ENCOUNTER — Telehealth: Payer: Self-pay

## 2020-04-15 NOTE — Telephone Encounter (Signed)
Patient calls nurse line requesting Ozempic samples. Patient states that current cost would be around $500/ month and she cannot afford this.   Will route to pharmacy team.   Talbot Grumbling, RN

## 2020-04-15 NOTE — Telephone Encounter (Signed)
Attempted to reach out to patient to discuss cost of Ozempic. Insurance card on file is from Nov 2021 and called to verify if any changes to insurance. Patient did not answer and left voicemail requesting call back. We do not have samples of Ozempic 34m. Depending on patient's answer regarding insurance coverage will help determine next best steps.

## 2020-04-18 ENCOUNTER — Telehealth: Payer: Self-pay | Admitting: Pharmacist

## 2020-04-18 NOTE — Telephone Encounter (Signed)
Second attempt at trying to contact patient. Left another voicemail with my direct call back number.

## 2020-04-18 NOTE — Telephone Encounter (Signed)
Patient returned my call regarding Ozempic cost issue. She states her deductible is $2500 which I explained is likely the reason for the high cost of Ozempic as this is a Tier 2 medication per her insurance formulary.   I provided pharmacy with coupon, but even with coupon only reduces price by $100, so still not feasible for patient.  Patient states she is going to speak to Dr. McDiarmid about Ozempic, as her blood glucose has also been high and she would like to discuss this with him.

## 2020-04-19 ENCOUNTER — Encounter: Payer: Self-pay | Admitting: Family Medicine

## 2020-04-19 NOTE — Progress Notes (Addendum)
[facility] Note  04/22/2020     CHIEF COMPLAINT Patient presents for Retina Follow Up   HISTORY OF PRESENT ILLNESS: Alicia West is a 50 y.o. female who presents to the clinic today for:   HPI    Retina Follow Up    Patient presents with  Diabetic Retinopathy.  In left eye.  This started 1 month ago.  Severity is moderate.  Duration of 1 month.  Since onset it is gradually worsening.  I, the attending physician,  performed the HPI with the patient and updated documentation appropriately.          Comments    Patient lost to f/u since December 2020. Had avastin #1 OS on 12.15.20. Has difficulty driving. Wearing CL's for driving and patient can see OK with CL for distance, but not for reading. BS was 247 yesterday am. Last a1c was 11, checked December 2021.        Last edited by Bernarda Caffey, MD on 04/22/2020  8:15 AM. (History)    pt has been lost to follow up from December 2020 due to financial reasons, pt states she feels like her left eye is getting worse   Referring physician: Jola Schmidt, MD [place],  [place] [address]  HISTORICAL INFORMATION:   Selected notes from the [facility] Patient referred by Dr. Jola Schmidt for diabetic retinal eval   CURRENT MEDICATIONS: No current outpatient medications on file. (Ophthalmic Drugs)   No current facility-administered medications for this visit. (Ophthalmic Drugs)   Current Outpatient Medications (Other)  Medication Sig  . atorvastatin (LIPITOR) 40 MG tablet Take 1 tablet (40 mg total) by mouth 2 (two) times a week.  . carvedilol (COREG) 25 MG tablet TAKE ONE TABLET BY MOUTH TWICE A DAY WITH MEALS  . gabapentin (NEURONTIN) 100 MG capsule TAKE ONE CAPSULE BY MOUTH EVERY NIGHT AT BEDTIME  . glucose blood (BAYER CONTOUR NEXT TEST) test strip Use to check blood sugar three times a day or as directed.  Marland Kitchen ibuprofen (ADVIL) 400 MG tablet Take by mouth daily as needed.  .  Insulin Glargine (BASAGLAR KWIKPEN) 100 UNIT/ML Inject 0.28 mLs (28 Units total) into the skin daily.  . Insulin Pen Needle (TECHLITE PEN NEEDLES) 32G X 6 MM MISC Use to inject insulin daily as instructed  . Lancets MISC Use Contour Next Lancet to check blood glucose 3 times a day.  Dx code E11.65.  Marland Kitchen linaclotide (LINZESS) 145 MCG CAPS capsule Take 1 capsule (145 mcg total) by mouth daily.  Marland Kitchen losartan-hydrochlorothiazide (HYZAAR) 100-25 MG tablet TAKE ONE TABLET BY MOUTH DAILY  . metFORMIN (GLUCOPHAGE) 500 MG tablet TAKE ONE TABLET BY MOUTH TWICE A DAY WITH A MEAL  . omeprazole (PRILOSEC) 20 MG capsule Take 1 capsule (20 mg total) by mouth daily.  . Prenatal Vit-Fe Fum-Fe Bisg-FA (NATACHEW) 28-1 MG CHEW Chew 1 tablet by mouth at bedtime.   Marland Kitchen spironolactone (ALDACTONE) 25 MG tablet TAKE ONE TABLET BY MOUTH DAILY  . Syringe, Disposable, 1 ML MISC 1 each by Does not apply route 3 (three) times daily.  . Semaglutide, 1 MG/DOSE, 4 MG/3ML SOPN Inject 1 mg into the skin once a week. (Patient not taking: Reported on 04/22/2020)   No current facility-administered medications for this visit. (Other)      REVIEW OF SYSTEMS: ROS    Positive for: Endocrine, Eyes   Negative for: Constitutional, Gastrointestinal, Neurological, Skin, Genitourinary, Musculoskeletal, HENT, Cardiovascular, Respiratory,  Psychiatric, Allergic/Imm, Heme/Lymph   Last edited by Roselee Nova D, COT on 04/22/2020  7:52 AM. (History)       ALLERGIES Allergies  Allergen Reactions  . Ace Inhibitors Cough  . Lipitor [Atorvastatin] Other (See Comments)    Muscle aches in thighs  . Norvasc [Amlodipine] Swelling    edema  . Penicillins Itching and Rash    Able to tolerate cephalosporins  . Metformin And Related     Malaise. Pt states this occurs when taking a high dosage.    PAST MEDICAL HISTORY Past Medical History:  Diagnosis Date  . Allergy    seasonal sinus allergies  . ANEMIA, IRON DEFICIENCY, UNSPEC. 04/04/2006    Qualifier: Diagnosis of  By: Samara Snide    . Bilateral senile cataracts 11/20/2018  . Binge-eating disorder, moderate 03/17/2013  . Breast lump 07/22/2009   Qualifier: Diagnosis of  By: Netty Starring  MD, Lucianne Muss    . CKD stage 3 secondary to diabetes ([place]) 10/17/2018  . DEPRESSIVE DISORDER, NOS 04/04/2006   Qualifier: Diagnosis of  By: Samara Snide    . Diabetes mellitus    on meds  . Diabetic macular edema ([place])    by patient report 10/27/2019  . Diabetic neuropathy ([place]) 10/17/2018  . Diabetic peripheral neuropathy ([place])    per patient  . Diabetic visual loss: severe vision impairment of both eyes, with macular edema, with severe nonproliferative retinopathy, associated with type 2 diabetes mellitus ([place]) 11/20/2018      . Folliculitis 4/65/0354  . Heavy menses    Pt reports she was to get a hysterectomy  . Herpes labialis 09/06/2010  . Herpes simplex labialis   . Hypercalcemia 08/13/2019  . Hyperlipidemia    diet controlled/not taking meds for this  . Hypertension    on meds  . Menorrhagia 04/04/2006   Qualifier: Diagnosis of  By: Samara Snide    . Morbid obesity ([place]) 05/01/2013  . Neuromuscular disorder ([place])   . Obstructive sleep apnea 11/27/2019  . Osteoporosis    bilateral hips R>L  . Paroxysmal supraventricular tachycardia ([place]) 10/26/2014  . PONV (postoperative nausea and vomiting)   . POSITIVE PPD 10/18/2009   Qualifier: Diagnosis of  By: Zebedee Iba NP, Manuela Schwartz    . Prurigo nodularis 10/17/2018  . RHINITIS, ALLERGIC 04/04/2006   Qualifier: Diagnosis of  By: Samara Snide    . Sleep apnea   . TB (tuberculosis) contact    + skin test,/- chest x-ray - in mid 2000's  . Thrombocytosis 08/18/2019  . Tuberculosis    postitive 2011-[facility]-cleared per pt  . Tubular adenoma of colon 2021   2 mm sessile, Rikki Spearing MD ([facility])  . Visual impairment of left eye 08/18/2019   Past Surgical History:  Procedure Laterality Date  . REDUCTION MAMMAPLASTY    . TONSILLECTOMY    .  TONSILLECTOMY    . WISDOM TOOTH EXTRACTION      FAMILY HISTORY Family History  Problem Relation Age of Onset  . Hypertension Mother   . Diabetes Mellitus II Mother   . Congestive Heart Failure Mother        Deceased from complications of CHF  . Hypertension Father   . Diabetes Mellitus II Father   . Colon polyps Father 62  . Colon cancer Father 3  . Diabetes Mellitus II Brother   . Colon polyps Brother 64  . Colon polyps Maternal Uncle 62  . Colon cancer Maternal Uncle 62  . Esophageal cancer Neg Hx   .  Stomach cancer Neg Hx   . Rectal cancer Neg Hx     SOCIAL HISTORY Social History   Tobacco Use  . Smoking status: Never Smoker  . Smokeless tobacco: Never Used  Vaping Use  . Vaping Use: Never used  Substance Use Topics  . Alcohol use: Yes    Comment: Occas  . Drug use: No         OPHTHALMIC EXAM:  Base Eye Exam    Visual Acuity (Snellen - Linear)      Right Left   Dist [place] 20/60 -2 20/60 +2   Dist ph [place] 20/30 -1 20/50       Tonometry (Tonopen, 8:05 AM)      Right Left   Pressure 18 16       Pupils      Dark Light Shape React APD   Right 3 2 Round Brisk None   Left 3 2 Round Brisk None       Visual Fields (Counting fingers)      Left Right    Full Full       Extraocular Movement      Right Left    Full, Ortho Full, Ortho       Neuro/Psych    Oriented x3: Yes   Mood/Affect: Normal       Dilation    Both eyes: 1.0% Mydriacyl, 2.5% Phenylephrine @ 8:05 AM        Slit Lamp and Fundus Exam    Slit Lamp Exam      Right Left   Lids/Lashes Dermatochalasis - upper lid Dermatochalasis - upper lid   Conjunctiva/Sclera Mild Melanosis Mild Melanosis   Cornea trace i8nferior PEE trace inferior PEE   Anterior Chamber Deep and quiet Deep and quiet   Iris Round and dilated Round and dilated   Lens 2+ Nuclear sclerosis, 2+ Cortical cataract, trace Posterior subcapsular cataract 2+ Nuclear sclerosis, 2+ Cortical cataract, trace PSC   Vitreous Mild  Vitreous syneresis Mild Vitreous syneresis       Fundus Exam      Right Left   Disc Pink and Sharp, +cupping Pink and Sharp, +cupping   C/D Ratio 0.6 0.6   Macula bluntedfoveal reflex, +MA, +exudates greatest temporal macula Flat, Blunted foveal reflex, +Microaneurysms and exudates, +edema central and temporal macula   Vessels attenuated, mild tortuousity, mild Copper wiring attenuated, Tortuous, mild Copper wiring, +NV along IN arcades   Periphery Attached, scattered MA Attached, scattered MA        Refraction    Manifest Refraction      Sphere Cylinder Axis Dist VA   Right -1.75 +0.25 007 20/20-1   Left -1.75 sph  20/30-1          IMAGING AND PROCEDURES  Imaging and Procedures for @TODAY @  OCT, Retina - OU - Both Eyes       Right Eye Quality was good. Central Foveal Thickness: 326. Progression has worsened. Findings include normal foveal contour, intraretinal fluid, intraretinal hyper-reflective material, no SRF (Interval increase in temporal edema).   Left Eye Quality was good. Central Foveal Thickness: 448. Progression has worsened. Findings include abnormal foveal contour, intraretinal fluid, intraretinal hyper-reflective material, no SRF, vitreomacular adhesion  (Mild Interval increase in temporal edema).   Notes *Images captured and stored on drive  Diagnosis / Impression:  OD: NFP, no SRF; Interval increase in temporal edema OS: abnormal foveal profile with +central IRF/DME, no SRF; mild Interval increase in temporal edema  Clinical management:  See below  Abbreviations: NFP - Normal foveal profile. CME - cystoid macular edema. PED - pigment epithelial detachment. IRF - intraretinal fluid. SRF - subretinal fluid. EZ - ellipsoid zone. ERM - epiretinal membrane. ORA - outer retinal atrophy. ORT - outer retinal tubulation. SRHM - subretinal hyper-reflective material        Fluorescein Angiography Optos (Transit OS)       Right Eye   Progression has been  stable. Early phase findings include microaneurysm, vascular perfusion defect. Mid/Late phase findings include microaneurysm, leakage, vascular perfusion defect.   Left Eye   Progression has worsened. Early phase findings include vascular perfusion defect, microaneurysm, retinal neovascularization, leakage (NV IN arcades). Mid/Late phase findings include microaneurysm, vascular perfusion defect, leakage, retinal neovascularization (NV IN arcades).   Notes **Images stored on drive**  Impression: OD: severe NPDR - no NV OS: PDR -- interval conversion from NPDR -- new NVE IN arcades Late leaking MA OU Vascular perfusion defects OU        Intravitreal Injection, Pharmacologic Agent - OS - Left Eye       Time Out 04/22/2020. 9:28 AM. Confirmed correct patient, procedure, site, and patient consented.   Anesthesia Topical anesthesia was used. Anesthetic medications included Lidocaine 2%, Proparacaine 0.5%.   Procedure Preparation included 5% betadine to ocular surface, eyelid speculum. A supplied needle was used.   Injection:  1.25 mg Bevacizumab (AVASTIN) 1.18m/0.05mL SOLN   NDC: 506004-599-77 Lot: [id number] , Expiration date: 05/30/2020   Route: Intravitreal, Site: Left Eye, Waste: 0 mL  Post-op Post injection exam found visual acuity of at least counting fingers. The patient tolerated the procedure well. There were no complications. The patient received written and verbal post procedure care education.                 ASSESSMENT/PLAN:    ICD-10-CM   1. Severe nonproliferative diabetic retinopathy of right eye with macular edema associated with type 2 diabetes mellitus (HCC)  E5/8/2022Fluorescein Angiography Optos (Transit OS)  2. Proliferative diabetic retinopathy of left eye with macular edema associated with type 2 diabetes mellitus (HCC)  EM14.7092Intravitreal Injection, Pharmacologic Agent - OS - Left Eye    Bevacizumab (AVASTIN) SOLN 1.25 mg  3. Retinal edema   H35.81 OCT, Retina - OU - Both Eyes  4. Essential hypertension  I10   5. Hypertensive retinopathy of both eyes  H35.033   6. Combined forms of age-related cataract of both eyes  H25.813     1-3. Severe nonproliferative diabetic retinopathy, OD  - OS -- interval conversion to PDR  - lost to f/u here since 01/2019 -- s/p IVA OS #1 (12.15.20)  - [facility] [place] FRISCOand APortage but has not been seen since August of 2018 -- s/p multiple IVA OU  - exam shows scattered IRH and exudates OU  - OCT shows OD: Interval increase in temporal edema; OS: mild Interval increase in temporal edema  - BCVA 20/30 OD, 20/50 OS (stable OU)  - FA 12.15.20 shows scattered leaking MA OU, vascular perfusion defects OU, no NV OU  - repeat FA (03.18.22) shows new retinal NV OS -- will need PRP OS  - recommend IVA OS #2 today, 03.18.22 for DME  - pt wishes to proceed w/ injection  - RBA of procedure discussed, questions answered  - informed consent obtained and signed  - see procedure note  - will need PRP OU for patches of capillary nonperfusion and IVA OD eventually for  DME  - f/u 2 weeks for PRP OS  4,5. Hypertensive retinopathy OU  - discussed importance of tight BP control  - monitor  6. Mixed form age related cataract OU -- mild  - under the expert management of Dr. Valetta Close  - The symptoms of cataract, surgical options, and treatments and risks were discussed with patient.  - discussed diagnosis and progression and association of PSC with DM   Ophthalmic Meds Ordered this visit:  Meds ordered this encounter  Medications  . Bevacizumab (AVASTIN) SOLN 1.25 mg       Return in about 2 weeks (around 05/06/2020) for f/u PDR OS, DFE, OCT, Laser PRP.  There are no Patient Instructions on file for this visit.    This document serves as a record of services personally performed by Gardiner Sleeper, MD, PhD. It was created on their behalf by San Jetty. Owens Shark,  OA an ophthalmic technician. The creation of this record is the provider's dictation and/or activities during the visit.    Electronically signed by: San Jetty. Owens Shark, [place] 03.15.2022 1:25 PM  Gardiner Sleeper, M.D., Ph.D. Diseases & Surgery of the Retina and Vitreous [facility]  I have reviewed the above documentation for accuracy and completeness, and I agree with the above. Gardiner Sleeper, M.D., Ph.D. 04/22/20 1:25 PM   Abbreviations: M myopia (nearsighted); A astigmatism; H hyperopia (farsighted); P presbyopia; Mrx spectacle prescription;  CTL contact lenses; OD right eye; OS left eye; OU both eyes  XT exotropia; ET esotropia; PEK punctate epithelial keratitis; PEE punctate epithelial erosions; DES dry eye syndrome; MGD meibomian gland dysfunction; ATs artificial tears; PFAT's preservative free artificial tears; [place] nuclear sclerotic cataract; PSC posterior subcapsular cataract; ERM epi-retinal membrane; PVD posterior vitreous detachment; RD retinal detachment; DM diabetes mellitus; DR diabetic retinopathy; NPDR non-proliferative diabetic retinopathy; PDR proliferative diabetic retinopathy; CSME clinically significant macular edema; DME diabetic macular edema; dbh dot blot hemorrhages; CWS cotton wool spot; POAG primary open angle glaucoma; C/D cup-to-disc ratio; HVF humphrey visual field; GVF goldmann visual field; OCT optical coherence tomography; IOP intraocular pressure; BRVO Branch retinal vein occlusion; CRVO central retinal vein occlusion; CRAO central retinal artery occlusion; BRAO branch retinal artery occlusion; RT retinal tear; SB scleral buckle; PPV pars plana vitrectomy; VH Vitreous hemorrhage; PRP panretinal laser photocoagulation; IVK intravitreal kenalog; VMT vitreomacular traction; MH Macular hole;  NVD neovascularization of the disc; NVE neovascularization elsewhere; AREDS age related eye disease study; ARMD age related macular degeneration; POAG primary open  angle glaucoma; EBMD epithelial/anterior basement membrane dystrophy; ACIOL anterior chamber intraocular lens; IOL intraocular lens; PCIOL posterior chamber intraocular lens; Phaco/IOL phacoemulsification with intraocular lens placement; [place] photorefractive keratectomy; LASIK laser assisted in situ keratomileusis; HTN hypertension; DM diabetes mellitus; COPD chronic obstructive pulmonary disease

## 2020-04-20 ENCOUNTER — Telehealth: Payer: Self-pay | Admitting: Family Medicine

## 2020-04-20 DIAGNOSIS — R109 Unspecified abdominal pain: Secondary | ICD-10-CM

## 2020-04-20 DIAGNOSIS — G8929 Other chronic pain: Secondary | ICD-10-CM

## 2020-04-20 DIAGNOSIS — N1831 Chronic kidney disease, stage 3a: Secondary | ICD-10-CM

## 2020-04-20 NOTE — Telephone Encounter (Signed)
Called patient to discuss further. Patient confirmed that highest blood sugar reading was approx 800. Reported that during this episode she experienced SHOB, dizziness, seeing black spots and feeling lightheaded. Stated this episode was ~1 week ago.   Advised patient of the importance of ED evaluation with blood sugar readings that are this elevated. Patient is not currently symptomatic. Offered to schedule patient appointment with PCP to discuss diabetes management further. Patient requests phone call from Dr. McDiarmid prior to making appointment. Patient would also like referral to endocrinologist.   Strict ED precautions given.   Please advise.   Talbot Grumbling, RN

## 2020-04-20 NOTE — Telephone Encounter (Signed)
I spoke with Alicia West about her concern for her back pain due to her kidney and her not being able to afford the Ozempic.  Plan Referral of Alicia West to consult with nephrology to see assess whether or not her back pain is coming from her kidney.  My reassurance that it was not coming from her kidney is not adequate.   Request that Dr Claiborne West bring Alicia West in to discuss use of Insulin therapy, initially, to address her elevated fCBG (200-250) range. Newer antidiabetic agents can be used only if Alicia West is able to afford them.

## 2020-04-22 ENCOUNTER — Other Ambulatory Visit: Payer: Self-pay

## 2020-04-22 ENCOUNTER — Ambulatory Visit (INDEPENDENT_AMBULATORY_CARE_PROVIDER_SITE_OTHER): Payer: 59 | Admitting: Ophthalmology

## 2020-04-22 ENCOUNTER — Encounter (INDEPENDENT_AMBULATORY_CARE_PROVIDER_SITE_OTHER): Payer: Self-pay | Admitting: Ophthalmology

## 2020-04-22 DIAGNOSIS — I10 Essential (primary) hypertension: Secondary | ICD-10-CM | POA: Diagnosis not present

## 2020-04-22 DIAGNOSIS — H35033 Hypertensive retinopathy, bilateral: Secondary | ICD-10-CM

## 2020-04-22 DIAGNOSIS — E113512 Type 2 diabetes mellitus with proliferative diabetic retinopathy with macular edema, left eye: Secondary | ICD-10-CM | POA: Diagnosis not present

## 2020-04-22 DIAGNOSIS — E113413 Type 2 diabetes mellitus with severe nonproliferative diabetic retinopathy with macular edema, bilateral: Secondary | ICD-10-CM

## 2020-04-22 DIAGNOSIS — H3581 Retinal edema: Secondary | ICD-10-CM | POA: Diagnosis not present

## 2020-04-22 DIAGNOSIS — H25813 Combined forms of age-related cataract, bilateral: Secondary | ICD-10-CM

## 2020-04-22 DIAGNOSIS — E113411 Type 2 diabetes mellitus with severe nonproliferative diabetic retinopathy with macular edema, right eye: Secondary | ICD-10-CM | POA: Diagnosis not present

## 2020-04-22 MED ORDER — BEVACIZUMAB CHEMO INJECTION 1.25MG/0.05ML SYRINGE FOR KALEIDOSCOPE
1.2500 mg | INTRAVITREAL | Status: AC | PRN
  Administered 2020-04-22: 1.25 mg via INTRAVITREAL

## 2020-04-22 NOTE — Telephone Encounter (Signed)
Reviewed

## 2020-05-02 ENCOUNTER — Ambulatory Visit (INDEPENDENT_AMBULATORY_CARE_PROVIDER_SITE_OTHER): Payer: 59 | Admitting: Pharmacist

## 2020-05-02 ENCOUNTER — Other Ambulatory Visit: Payer: Self-pay

## 2020-05-02 VITALS — BP 118/68 | HR 90 | Ht 69.0 in | Wt 248.6 lb

## 2020-05-02 DIAGNOSIS — E782 Mixed hyperlipidemia: Secondary | ICD-10-CM

## 2020-05-02 DIAGNOSIS — E1165 Type 2 diabetes mellitus with hyperglycemia: Secondary | ICD-10-CM | POA: Diagnosis not present

## 2020-05-02 DIAGNOSIS — I152 Hypertension secondary to endocrine disorders: Secondary | ICD-10-CM

## 2020-05-02 DIAGNOSIS — E1159 Type 2 diabetes mellitus with other circulatory complications: Secondary | ICD-10-CM | POA: Diagnosis not present

## 2020-05-02 DIAGNOSIS — E1169 Type 2 diabetes mellitus with other specified complication: Secondary | ICD-10-CM | POA: Diagnosis not present

## 2020-05-02 NOTE — Patient Instructions (Signed)
Ms. Proano it was a pleasure seeing you today.   Please do the following:  1. RE-START Ozempic 0.32m once weekly as directed today during your appointment. If you have any questions or if you believe something has occurred because of this change, call me or your doctor to let one of uKoreaknow.  2. Continue checking blood sugars at home. It's really important that you record these and bring these in to your next doctor's appointment. Please check your blood sugar first thing in the morning before you eat anything. 3. Continue making the lifestyle changes we've discussed together during our visit. Diet and exercise play a significant role in improving your blood sugars.  4. I will call you on Friday.   Hypoglycemia or low blood sugar:   Low blood sugar can happen quickly and may become an emergency if not treated right away.   While this shouldn't happen often, it can be brought upon if you skip a meal or do not eat enough. Also, if your insulin or other diabetes medications are dosed too high, this can cause your blood sugar to go to low.   Warning signs of low blood sugar include: 1. Feeling shaky or dizzy 2. Feeling weak or tired  3. Excessive hunger 4. Feeling anxious or upset  5. Sweating even when you aren't exercising  What to do if I experience low blood sugar? Follow the Rule of 15 1. Check your blood sugar with your meter. If lower than 70, proceed to step 2.  2. Treat with 15 grams of fast acting carbs which is found in 3-4 glucose tablets. If none are available you can try hard candy, 1 tablespoon of sugar or honey,4 ounces of fruit juice, or 6 ounces of REGULAR soda.  3. Re-check your sugar in 15 minutes. If it is still below 70, do what you did in step 2 again. If your blood sugar has come back up, go ahead and eat a snack or small meal made up of complex carbs (ex. Whole grains) and protein at this time to avoid recurrence of low blood sugar.

## 2020-05-02 NOTE — Assessment & Plan Note (Signed)
Hypertension longstanding currently controlled.  Blood pressure goal <130/80 mmHg. Medication adherence appears optimal.   1. Continue carvedilol 36m BID, spironolactone 211mdaily, and losartan-HCTZ 100-25 daily

## 2020-05-02 NOTE — Assessment & Plan Note (Signed)
ASCVD risk - primary prevention in patient with diabetes. Last LDL is not controlled. ASCVD risk score is not >20%  - moderate intensity statin indicated. Aspirin is not indicated.   1. Continued atorvastatin 40 mg twice weekly.

## 2020-05-02 NOTE — Assessment & Plan Note (Signed)
T2DM longstanding is not controlled likely due to increased stress and inadequate diet . Medication adherence appears suboptimal. Additional pharmacotherapy is appropriate at this time to improve glycemic control. Patient with a history of intolerance to Victoza. Will initiate semaglutide (OZEMPIC), which patient previously tolerated. Patient provided sample in clinic and educated on purpose, proper use and potential adverse effects of Ozempic.  Following instruction patient verbalized understanding of treatment plan.    1. Continued basal insulin glargine (BASAGLAR) 34 units daily.  2. Restarted GLP-1 semaglutide (OZEMPIC) 0.59m once weekly 3. Extensively discussed pathophysiology of diabetes, dietary effects on blood sugar control, and recommended lifestyle interventions 4. Patient will adhere to dietary modifications 5. Counseled on s/sx of and management of hypoglycemia 6. Discussed utilizing sticky notes on insulin or a calendar to mark whether or not she has taken her daily dose of BASAGLAR 7. Next A1C anticipated 05/25/20.

## 2020-05-02 NOTE — Progress Notes (Signed)
Subjective:    Patient ID: Alicia West, female    DOB: [birth year], 50 y.o.   MRN: [id number]  HPI Patient is a 50 y.o. female who presents for diabetes management. She is in good spirits and presents without assistance. Patient was referred on 04/20/20 and last seen by Primary Care Provider on 02/25/20.   PMH significant for HTN, CKD, retinopathy, neuropathy, obesity .   Patient reports diabetes was diagnosed in 2002.   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 Patient denies adherence with medications due to having difficulty remembering. Patient states that she may miss her BASAGLAR 2x/week times per week, on average. If she cannot recall taking her BASAGLAR she will hold off on taking until the next day to be sure she does not double her dose.   Does you feel that your medications are working for you?  yes  Have you been experiencing any side effects to the medications prescribed? No, but does complain of some muscle aches which she does not believe is from atorvastatin however she knows that this medication can sometimes cause this side effect  Do you have any problems obtaining medications due to transportation or finances?  No, however, Ozempic is $800 due to patient's deductible    Patient reported dietary habits: Patient became emotional when discussing diet as she states she was on the right track but then the death of a coworker lead the patient to struggle with "eating the right foods." She states she would eat boxes of girl scout  cookies during this time. She is aware that her diet was likely causing her to experience elevated blood glucose levels, and is working on improving this.   Patient denies hypoglycemic events. Patient reports polyuria (increased urination).  Patient denies polyphagia (increased appetite).  Patient reports polydipsia (increased thirst).  Patient reports neuropathy (nerve pain). Patient reports visual changes. Patient reports self foot exams.   Patient reports she typically checks her blood glucose several times a day, especially when not feeling well Home fasting blood sugars: does not check fasting unless not feeling well 2 hour post-meal/random blood sugars: 220's-422  Objective:   Labs:   Lab Results  Component Value Date   HGBA1C 8.6 (A) 02/25/2020   HGBA1C 7.8 (H) 07/29/2019   HGBA1C 8.5 (A) 01/28/2019    Vitals:   05/02/20 0925  BP: 118/68  Pulse: 90  SpO2: 98%    Lab Results  Component Value Date   MICRALBCREAT 9 09/03/2019    Lipid Panel     Component Value Date/Time   CHOL 211 (H) 10/16/2018 1549   TRIG 193 (H) 10/16/2018 1549   HDL 37 (L) 10/16/2018 1549   CHOLHDL 5.7 (H) 10/16/2018 1549   CHOLHDL 3.6 03/17/2013 1109   VLDL 28 03/17/2013 1109   LDLCALC 139 (H) 10/16/2018 1549   LDLDIRECT 85 03/09/2014 1115    Clinical Atherosclerotic Cardiovascular Disease (ASCVD): No  The 10-year ASCVD risk score (GoMikey Bussing Jr., et al., 2013) is: 10.5%   Values used to calculate the score:     Age: [age]     Sex: Female     Is Non-Hispanic African American: Yes  Diabetic: Yes     Tobacco smoker: No     Systolic Blood Pressure: 715 mmHg     Is BP treated: Yes     HDL Cholesterol: 37 mg/dL     Total Cholesterol: 211 mg/dL   PHQ-9 Score: patient states she never completes this  Assessment/Plan:   T2DM longstanding is not controlled likely due to increased stress and inadequate diet . Medication adherence appears suboptimal. Additional pharmacotherapy is  appropriate at this time to improve glycemic control. Patient with a history of intolerance to Victoza. Will initiate semaglutide (OZEMPIC), which patient previously tolerated. Patient provided sample in clinic and educated on purpose, proper use and potential adverse effects of Ozempic.  Following instruction patient verbalized understanding of treatment plan.    1. Continued basal insulin glargine (BASAGLAR) 34 units daily.  2. Restarted GLP-1 semaglutide (OZEMPIC) 0.7m once weekly 3. Extensively discussed pathophysiology of diabetes, dietary effects on blood sugar control, and recommended lifestyle interventions 4. Patient will adhere to dietary modifications 5. Counseled on s/sx of and management of hypoglycemia 6. Discussed utilizing sticky notes on insulin or a calendar to mark whether or not she has taken her daily dose of BASAGLAR 7. Next A1C anticipated 05/25/20.   ASCVD risk - primary prevention in patient with diabetes. Last LDL is not controlled. ASCVD risk score is not >20%  - moderate intensity statin indicated. Aspirin is not indicated.   1. Continued atorvastatin 40 mg twice weekly.  Hypertension longstanding currently controlled.  Blood pressure goal <130/80 mmHg. Medication adherence appears optimal.   1. Continue carvedilol 283mBID, spironolactone 2533maily, and losartan-HCTZ 100-25 daily  Follow-up appointment in one week to review sugar readings and Ozempic tolerability. Written patient instructions provided.  This appointment required 75 minutes of patient care (this includes precharting, chart review, review of results, and face-to-face care).  Thank you for involving pharmacy to assist in providing this patient's care.  Patient seen with KynInis SizerharmD Candidate  Medication Samples have been provided to the patient.  Drug name: Ozempic (Semaglutide)      Strength: 1.6m90m        Qty: 1 pen  LOT: [id number] Exp.Date: 07/2022  Dosing instructions: Inject  0.25mg24me weekly for 4 weeks and then increase to 0.5mg o57m weekly  The patient has been instructed regarding the correct time, dose, and frequency of taking this medication, including desired effects and most common side effects.   RachelHughes BetterAM 05/02/2020

## 2020-05-02 NOTE — Progress Notes (Deleted)
Subjective:    Patient ID: Alicia West, female    DOB: 06-18-1970, 50 y.o.   MRN: [id number]  HPI Patient is a 50 y.o. female who presents for diabetes management. She is in good spirits and presents without assistance. Patient was referred and last seen by Primary Care Provider ***  PMH significant for ***.   Patient reports diabetes was diagnosed around 2002.   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 Patient {Actions; denies-reports:120008} adherence with medications. {He/she (caps):30048} states that they miss their medications *** times per week, on average. Pill organizer Sometimes cannot remember if she took her insulin  Does you feel that your medications are working for you?  yes  Have you been experiencing any side effects to the medications prescribed? No but does complain of muscle aches and is unsure if that is from atorvastatin or elevated blood glucose  Do you have any problems obtaining medications due to transportation or finances?  yes     Patient reported dietary habits:  Eats *** meals/day and *** snacks/day; Boluses with *** meals/day and *** snacks/day Breakfast: glucerna or oatmeal; tKuwaitbacon; Lunch:*** Dinner:*** Snacks:girl scout Drinks:***  Patient-reported exercise habits: ***   Patient denies hypoglycemic events. Patient reports polyuria (increased urination).  Patient denies polyphagia (increased appetite).  Patient reports polydipsia (increased thirst).  Patient reports neuropathy (nerve pain). Patient reports visual changes. Patient reports self foot exams.   Home fasting blood sugars: does not check  396 before bedtime so took 15 more units of basaglar 2 hour post-meal/random blood sugars: *** 422 (after dinner) -  220's   Pill twice  Objective:   Labs:   Physical Exam  ROS  Lab Results  Component Value Date   HGBA1C 8.6 (A) 02/25/2020   HGBA1C 7.8 (H) 07/29/2019   HGBA1C 8.5 (A) 01/28/2019    There were no vitals filed for this visit.  Lab Results  Component Value Date   MICRALBCREAT 9 09/03/2019    Lipid Panel     Component Value Date/Time   CHOL 211 (H) 10/16/2018 1549   TRIG 193 (H) 10/16/2018 1549   HDL 37 (L) 10/16/2018 1549   CHOLHDL 5.7 (H) 10/16/2018 1549   CHOLHDL 3.6 03/17/2013 1109   VLDL 28 03/17/2013 1109   LDLCALC 139 (H) 10/16/2018 1549   LDLDIRECT 85 03/09/2014 1115    Clinical Atherosclerotic Cardiovascular Disease (ASCVD): {YES/NO:21197} The 10-year ASCVD risk score (Mikey BussingDC Jr., et al., 2013) is: 11.2%   Values used to calculate the score:     Age: [age]years     Sex: Female     Is Non-Hispanic African American: Yes     Diabetic: Yes     Tobacco smoker: No     Systolic Blood Pressure: 1226mmHg     Is BP treated: Yes     HDL Cholesterol: 37 mg/dL     Total Cholesterol: 211 mg/dL   PHQ-9 Score: ***  Assessment/Plan:   ***T1/T2DM {ACTION; IS/IS NJFH:[id number]}controlled likely due to ***. Medication adherence appears ***. Additional pharmacotherapy {ACTION; IS/IS [id number]} Patient with a history of intolerance to ***. Will initiate ***. Benefits of medication include ***. Patient educated on purpose, proper use and potential adverse effects of ***.  Following instruction patient verbalized understanding of treatment plan.  Dexcom CGM placed on patient's *** successfully.  1. {Meds adjust:18428} basal insulin *** (insulin ***). Patient will continue to titrate 1 unit every *** days if fasting blood sugar > 137m/dl until fasting blood sugars reach goal or next visit. 2. {Meds adjust:18428}  rapid insulin *** (insulin ***) to ***.  3. {Meds adjust:18428} GLP-1 *** (generic name***) to ***.  4. {Meds adjust:18428} SGLT2-I *** (generic name***) to  ***. Counseled on sick day rules for ***. 5. Extensively discussed pathophysiology of diabetes, recommended lifestyle interventions, dietary effects on blood sugar control 6. Patient will adhere to dietary modifications 7. Patient will exercise *** with goal to increase towards target of at least 150 minutes of moderate intensity exercise weekly 8. Counseled on s/sx of and management of hypoglycemia 9. Next A1C anticipated ***.   Follow-up appointment *** to review sugar readings. Written patient instructions provided.  This appointment required *** minutes of patient care (this includes precharting, chart review, review of results, and face-to-face care).  Thank you for involving pharmacy to assist in providing this patient's care.  Patient seen with ***  Medication Samples have been provided to the patient.  Drug name: Ozempic (Semaglutide)      Strength: 1.335mmL        Qty: 1 pen  LOT: [id number] Exp.Date: 07/2022  Dosing instructions: Inject 0.2533mnce weekly for 4 weeks and then increase to 0.5mg15mce weekly  The patient has been instructed regarding the correct time, dose, and frequency of taking this medication, including desired effects and most common side effects.   RachHughes Better4 AM 05/02/2020

## 2020-05-04 NOTE — Progress Notes (Addendum)
[facility] Note  05/06/2020     CHIEF COMPLAINT Patient presents for Retina Follow Up   HISTORY OF PRESENT ILLNESS: Alicia West is a 50 y.o. female who presents to the clinic today for:   HPI    Retina Follow Up    Patient presents with  Diabetic Retinopathy.  In right eye.  Severity is moderate.  Duration of 2 weeks.  Since onset it is stable.  I, the attending physician,  performed the HPI with the patient and updated documentation appropriately.          Comments    Patient states vision improves initially after shot OS. Was better for the first 2 weeks and patient can tell vision getting blurry again. Here for PRP OS. BS was 288 this am.        Last edited by Bernarda Caffey, MD on 05/06/2020 11:27 AM. (History)    pt here for [facility] OS  Referring physician: McDiarmid, Blane Ohara, MD [place],  [place] [address]  HISTORICAL INFORMATION:   Selected notes from the [facility] Patient referred by Dr. Jola Schmidt for diabetic retinal eval   CURRENT MEDICATIONS: Current Outpatient Medications (Ophthalmic Drugs)  Medication Sig  . prednisoLONE acetate (PRED FORTE) 1 % ophthalmic suspension Place 1 drop into the left eye 4 (four) times daily for 7 days.   No current facility-administered medications for this visit. (Ophthalmic Drugs)   Current Outpatient Medications (Other)  Medication Sig  . acetaminophen (TYLENOL) 500 MG tablet Take 500 mg by mouth every 6 (six) hours as needed.  Marland Kitchen atorvastatin (LIPITOR) 40 MG tablet Take 1 tablet (40 mg total) by mouth 2 (two) times a week.  . carvedilol (COREG) 25 MG tablet TAKE ONE TABLET BY MOUTH TWICE A DAY WITH MEALS  . gabapentin (NEURONTIN) 100 MG capsule TAKE ONE CAPSULE BY MOUTH EVERY NIGHT AT BEDTIME  . glucose blood (BAYER CONTOUR NEXT TEST) test strip Use to check blood sugar three times a day or as directed.  . Insulin Glargine (BASAGLAR KWIKPEN) 100 UNIT/ML Inject 0.28  mLs (28 Units total) into the skin daily.  . Insulin Pen Needle (TECHLITE PEN NEEDLES) 32G X 6 MM MISC Use to inject insulin daily as instructed  . Lancets MISC Use Contour Next Lancet to check blood glucose 3 times a day.  Dx code E11.65.  Marland Kitchen linaclotide (LINZESS) 145 MCG CAPS capsule Take 1 capsule (145 mcg total) by mouth daily.  Marland Kitchen losartan-hydrochlorothiazide (HYZAAR) 100-25 MG tablet TAKE ONE TABLET BY MOUTH DAILY  . metFORMIN (GLUCOPHAGE) 500 MG tablet TAKE ONE TABLET BY MOUTH TWICE A DAY WITH A MEAL  . omeprazole (PRILOSEC) 20 MG capsule Take 1 capsule (20 mg total) by mouth daily.  . Prenatal Vit-Fe Fum-Fe Bisg-FA (NATACHEW) 28-1 MG CHEW Chew 1 tablet by mouth at bedtime.   Marland Kitchen spironolactone (ALDACTONE) 25 MG tablet TAKE ONE TABLET BY MOUTH DAILY  . Syringe, Disposable, 1 ML MISC 1 each by Does not apply route 3 (three) times daily.  Marland Kitchen ibuprofen (ADVIL) 400 MG tablet Take by mouth daily as needed. (Patient not taking: No sig reported)  . Semaglutide, 1 MG/DOSE, 4 MG/3ML SOPN Inject 1 mg into the skin once a week. (Patient not taking: No sig reported)   No current facility-administered medications for this visit. (Other)      REVIEW OF SYSTEMS: ROS    Positive for: Endocrine, Eyes   Negative for: Constitutional, Gastrointestinal,  Neurological, Skin, Genitourinary, Musculoskeletal, HENT, Cardiovascular, Respiratory, Psychiatric, Allergic/Imm, Heme/Lymph   Last edited by Roselee Nova D, COT on 05/06/2020  9:03 AM. (History)       ALLERGIES Allergies  Allergen Reactions  . Ace Inhibitors Cough  . Lipitor [Atorvastatin] Other (See Comments)    Muscle aches in thighs  . Norvasc [Amlodipine] Swelling    edema  . Penicillins Itching and Rash    Able to tolerate cephalosporins  . Metformin And Related     Malaise. Pt states this occurs when taking a high dosage.    PAST MEDICAL HISTORY Past Medical History:  Diagnosis Date  . Allergy    seasonal sinus allergies  . ANEMIA,  IRON DEFICIENCY, UNSPEC. 04/04/2006   Qualifier: Diagnosis of  By: Samara Snide    . Bilateral senile cataracts 11/20/2018  . Binge-eating disorder, moderate 03/17/2013  . Breast lump 07/22/2009   Qualifier: Diagnosis of  By: Netty Starring  MD, Lucianne Muss    . CKD stage 3 secondary to diabetes ([place]) 10/17/2018  . DEPRESSIVE DISORDER, NOS 04/04/2006   Qualifier: Diagnosis of  By: Samara Snide    . Diabetes mellitus    on meds  . Diabetic macular edema ([place])    by patient report 10/27/2019  . Diabetic neuropathy ([place]) 10/17/2018  . Diabetic peripheral neuropathy ([place])    per patient  . Diabetic visual loss: severe vision impairment of both eyes, with macular edema, with severe nonproliferative retinopathy, associated with type 2 diabetes mellitus ([place]) 11/20/2018      . Folliculitis 2/77/4128  . Heavy menses    Pt reports she was to get a hysterectomy  . Herpes labialis 09/06/2010  . Herpes simplex labialis   . Hypercalcemia 08/13/2019  . Hyperlipidemia    diet controlled/not taking meds for this  . Hypertension    on meds  . Menorrhagia 04/04/2006   Qualifier: Diagnosis of  By: Samara Snide    . Morbid obesity ([place]) 05/01/2013  . Neuromuscular disorder ([place])   . Obstructive sleep apnea 11/27/2019  . Osteoporosis    bilateral hips R>L  . Paroxysmal supraventricular tachycardia ([place]) 10/26/2014  . PONV (postoperative nausea and vomiting)   . POSITIVE PPD 10/18/2009   Qualifier: Diagnosis of  By: Zebedee Iba NP, Manuela Schwartz    . Prurigo nodularis 10/17/2018  . RHINITIS, ALLERGIC 04/04/2006   Qualifier: Diagnosis of  By: Samara Snide    . Sleep apnea   . TB (tuberculosis) contact    + skin test,/- chest x-ray - in mid 2000's  . Thrombocytosis 08/18/2019  . Tuberculosis    postitive 2011-[facility]-cleared per pt  . Tubular adenoma of colon 2021   2 mm sessile, Rikki Spearing MD ([facility])  . Visual impairment of left eye 08/18/2019   Past Surgical History:  Procedure Laterality Date  . REDUCTION  MAMMAPLASTY    . TONSILLECTOMY    . TONSILLECTOMY    . WISDOM TOOTH EXTRACTION      FAMILY HISTORY Family History  Problem Relation Age of Onset  . Hypertension Mother   . Diabetes Mellitus II Mother   . Congestive Heart Failure Mother        Deceased from complications of CHF  . Hypertension Father   . Diabetes Mellitus II Father   . Colon polyps Father 28  . Colon cancer Father 61  . Diabetes Mellitus II Brother   . Colon polyps Brother 59  . Colon polyps Maternal Uncle 62  . Colon cancer Maternal Uncle  62  . Esophageal cancer Neg Hx   . Stomach cancer Neg Hx   . Rectal cancer Neg Hx     SOCIAL HISTORY Social History   Tobacco Use  . Smoking status: Never Smoker  . Smokeless tobacco: Never Used  Vaping Use  . Vaping Use: Never used  Substance Use Topics  . Alcohol use: Yes    Comment: Occas  . Drug use: No         OPHTHALMIC EXAM:  Base Eye Exam    Visual Acuity (Snellen - Linear)      Right Left   Dist [place] 20/80 -2 20/70 -2   Dist ph [place] 20/40 -1 20/50       Tonometry (Tonopen, 9:16 AM)      Right Left   Pressure 13 11       Pupils      Dark Light Shape React APD   Right 3 2 Round Brisk None   Left 3 2 Round Brisk None       Visual Fields (Counting fingers)      Left Right    Full Full       Extraocular Movement      Right Left    Full, Ortho Full, Ortho       Neuro/Psych    Oriented x3: Yes   Mood/Affect: Normal       Dilation    Both eyes: 1.0% Mydriacyl, 2.5% Phenylephrine @ 9:16 AM        Slit Lamp and Fundus Exam    Slit Lamp Exam      Right Left   Lids/Lashes Dermatochalasis - upper lid Dermatochalasis - upper lid   Conjunctiva/Sclera Mild Melanosis Mild Melanosis   Cornea trace i8nferior PEE trace inferior PEE   Anterior Chamber Deep and quiet Deep and quiet   Iris Round and dilated Round and dilated   Lens 2+ Nuclear sclerosis, 2+ Cortical cataract, trace Posterior subcapsular cataract 2+ Nuclear sclerosis, 2+  Cortical cataract, trace PSC   Vitreous Mild Vitreous syneresis Mild Vitreous syneresis       Fundus Exam      Right Left   Disc Pink and Sharp, +cupping Pink and Sharp, +cupping   C/D Ratio 0.6 0.6   Macula bluntedfoveal reflex, +MA, +exudates greatest temporal macula Flat, Blunted foveal reflex, +Microaneurysms and exudates, +edema central and temporal macula   Vessels attenuated, mild tortuousity, mild Copper wiring attenuated, Tortuous, mild Copper wiring, +NV along IN arcades   Periphery Attached, scattered MA Attached, scattered MA        Refraction    Manifest Refraction      Dist VA   Right 20/25+1   Left 20/30+2          IMAGING AND PROCEDURES  Imaging and Procedures for @TODAY @  OCT, Retina - OU - Both Eyes       Right Eye Quality was good. Central Foveal Thickness: 228. Progression has improved. Findings include normal foveal contour, intraretinal fluid, intraretinal hyper-reflective material, no SRF (Mild Interval improvement in IRF temporal macula).   Left Eye Quality was good. Central Foveal Thickness: 406. Progression has improved. Findings include abnormal foveal contour, intraretinal fluid, intraretinal hyper-reflective material, no SRF, vitreomacular adhesion  (Mild Interval improvement in IRF temporal macula ).   Notes *Images captured and stored on drive  Diagnosis / Impression:  NFP, no SRF OU mild interval improvement in temporal IRF OU  Clinical management:  See below  Abbreviations: NFP - Normal  foveal profile. CME - cystoid macular edema. PED - pigment epithelial detachment. IRF - intraretinal fluid. SRF - subretinal fluid. EZ - ellipsoid zone. ERM - epiretinal membrane. ORA - outer retinal atrophy. ORT - outer retinal tubulation. SRHM - subretinal hyper-reflective material        Panretinal Photocoagulation - OS - Left Eye       LASER PROCEDURE NOTE  Diagnosis:   Proliferative Diabetic Retinopathy, LEFT EYE  Procedure:  Pan-retinal  photocoagulation using slit lamp laser, LEFT EYE  Anesthesia:  Topical  Surgeon: Bernarda Caffey, MD, PhD   Informed consent obtained, operative eye marked, and time out performed prior to initiation of laser.   Lumenis [id number] slit lamp laser Pattern: 3x3 square Power: 270 mW Duration: 30 msec  Spot size: 200 microns  # spots: [phone] spots  Complications: None.  RTC: 2 wks  Patient tolerated the procedure well and received written and verbal post-procedure care information/education.                ASSESSMENT/PLAN:    ICD-10-CM   1. Severe nonproliferative diabetic retinopathy of right eye with macular edema associated with type 2 diabetes mellitus ([place])  E11.3411   2. Proliferative diabetic retinopathy of left eye with macular edema associated with type 2 diabetes mellitus ([place])  U31.4970 Panretinal Photocoagulation - OS - Left Eye  3. Retinal edema  H35.81 OCT, Retina - OU - Both Eyes  4. Essential hypertension  I10   5. Hypertensive retinopathy of both eyes  H35.033   6. Combined forms of age-related cataract of both eyes  H25.813     1-3. Severe nonproliferative diabetic retinopathy, OD  - OS -- interval conversion to PDR -- noted 3.18.22  - lost to f/u here since 01/2019 -- s/p IVA OS #1 (12.15.20), #2 (03.18.22)  - previously followed at [facility] with Drs. Oval Linsey and [place], but has not been seen since August of 2018 -- s/p multiple IVA OU  - s/p IVA OS #1 (12.16.20), #2 (03.18.22)  - exam shows scattered IRH and exudates OU  - OCT shows Interval improvement in temporal edema OU  - BCVA 20/40 OD, 20/50 OS (stable OU)  - FA 12.15.20 shows scattered leaking MA OU, vascular perfusion defects OU, no NV OU  - repeat FA (03.18.22) shows new retinal NV OS -- will need PRP OS  - recommend PRP OS today, 04.01.22  - pt wishes to proceed w/ laser  - RBA of procedure discussed, questions answered  - informed consent obtained and signed  - see  procedure note  - start PF QID OS x7 days  - will need PRP OU for patches of capillary nonperfusion and IVA OD eventually for DME  - f/u April 15 or later, DFE, OCT  4,5. Hypertensive retinopathy OU  - discussed importance of tight BP control  - monitor  6. Mixed form age related cataract OU -- mild  - under the expert management of Dr. Valetta Close  - The symptoms of cataract, surgical options, and treatments and risks were discussed with patient.  - discussed diagnosis and progression and association of PSC with DM   Ophthalmic Meds Ordered this visit:  Meds ordered this encounter  Medications  . prednisoLONE acetate (PRED FORTE) 1 % ophthalmic suspension    Sig: Place 1 drop into the left eye 4 (four) times daily for 7 days.    Dispense:  10 mL    Refill:  0       Return in  about 2 weeks (around 05/20/2020) for f/u PDR OU, DFE, OCT.  There are no Patient Instructions on file for this visit.    This document serves as a record of services personally performed by Gardiner Sleeper, MD, PhD. It was created on their behalf by San Jetty. Owens Shark, OA an ophthalmic technician. The creation of this record is the provider's dictation and/or activities during the visit.    Electronically signed by: San Jetty. [place], [place] 03.30.2022 11:42 AM  Gardiner Sleeper, M.D., Ph.D. Diseases & Surgery of the Retina and Vitreous [facility]  I have reviewed the above documentation for accuracy and completeness, and I agree with the above. Gardiner Sleeper, M.D., Ph.D. 05/06/20 11:42 AM  Abbreviations: M myopia (nearsighted); A astigmatism; H hyperopia (farsighted); P presbyopia; Mrx spectacle prescription;  CTL contact lenses; OD right eye; OS left eye; OU both eyes  XT exotropia; ET esotropia; PEK punctate epithelial keratitis; PEE punctate epithelial erosions; DES dry eye syndrome; MGD meibomian gland dysfunction; ATs artificial tears; PFAT's preservative free artificial tears; [place]  nuclear sclerotic cataract; PSC posterior subcapsular cataract; ERM epi-retinal membrane; PVD posterior vitreous detachment; RD retinal detachment; DM diabetes mellitus; DR diabetic retinopathy; NPDR non-proliferative diabetic retinopathy; PDR proliferative diabetic retinopathy; CSME clinically significant macular edema; DME diabetic macular edema; dbh dot blot hemorrhages; CWS cotton wool spot; POAG primary open angle glaucoma; C/D cup-to-disc ratio; HVF humphrey visual field; GVF goldmann visual field; OCT optical coherence tomography; IOP intraocular pressure; BRVO Branch retinal vein occlusion; CRVO central retinal vein occlusion; CRAO central retinal artery occlusion; BRAO branch retinal artery occlusion; RT retinal tear; SB scleral buckle; PPV pars plana vitrectomy; VH Vitreous hemorrhage; PRP panretinal laser photocoagulation; IVK intravitreal kenalog; VMT vitreomacular traction; MH Macular hole;  NVD neovascularization of the disc; NVE neovascularization elsewhere; AREDS age related eye disease study; ARMD age related macular degeneration; POAG primary open angle glaucoma; EBMD epithelial/anterior basement membrane dystrophy; ACIOL anterior chamber intraocular lens; IOL intraocular lens; PCIOL posterior chamber intraocular lens; Phaco/IOL phacoemulsification with intraocular lens placement; [place] photorefractive keratectomy; LASIK laser assisted in situ keratomileusis; HTN hypertension; DM diabetes mellitus; COPD chronic obstructive pulmonary disease

## 2020-05-06 ENCOUNTER — Ambulatory Visit (INDEPENDENT_AMBULATORY_CARE_PROVIDER_SITE_OTHER): Payer: 59 | Admitting: Ophthalmology

## 2020-05-06 ENCOUNTER — Encounter (INDEPENDENT_AMBULATORY_CARE_PROVIDER_SITE_OTHER): Payer: Self-pay | Admitting: Ophthalmology

## 2020-05-06 ENCOUNTER — Telehealth: Payer: Self-pay | Admitting: Pharmacist

## 2020-05-06 ENCOUNTER — Other Ambulatory Visit: Payer: Self-pay

## 2020-05-06 DIAGNOSIS — H3581 Retinal edema: Secondary | ICD-10-CM

## 2020-05-06 DIAGNOSIS — E113512 Type 2 diabetes mellitus with proliferative diabetic retinopathy with macular edema, left eye: Secondary | ICD-10-CM | POA: Diagnosis not present

## 2020-05-06 DIAGNOSIS — E113411 Type 2 diabetes mellitus with severe nonproliferative diabetic retinopathy with macular edema, right eye: Secondary | ICD-10-CM

## 2020-05-06 DIAGNOSIS — I10 Essential (primary) hypertension: Secondary | ICD-10-CM | POA: Diagnosis not present

## 2020-05-06 DIAGNOSIS — H25813 Combined forms of age-related cataract, bilateral: Secondary | ICD-10-CM

## 2020-05-06 DIAGNOSIS — E113413 Type 2 diabetes mellitus with severe nonproliferative diabetic retinopathy with macular edema, bilateral: Secondary | ICD-10-CM

## 2020-05-06 DIAGNOSIS — H35033 Hypertensive retinopathy, bilateral: Secondary | ICD-10-CM

## 2020-05-06 MED ORDER — PREDNISOLONE ACETATE 1 % OP SUSP: 1.0000 [drp] | Freq: Four times a day (QID) | OPHTHALMIC | 0 refills | Status: AC

## 2020-05-06 NOTE — Telephone Encounter (Signed)
Attempted to call patient for f/u from appt on 05/02/20.

## 2020-05-13 NOTE — Progress Notes (Signed)
[facility] Note  05/20/2020     CHIEF COMPLAINT Patient presents for Retina Follow Up   HISTORY OF PRESENT ILLNESS: Alicia West is a 50 y.o. female who presents to the clinic today for:   HPI    Retina Follow Up    Patient presents with  Diabetic Retinopathy.  In both eyes.  This started years ago.  Severity is moderate.  Duration of 2 weeks.  Since onset it is stable.  I, the attending physician,  performed the HPI with the patient and updated documentation appropriately.          Comments    50 y/o female pt here for 2 wk f/u for DR OU.  Feels VA may be a little blurrier OS; could just be due to floaters.  No change in [place] OD.  Denies pain, FOL.  Eyes feel dry, especially in the mornings.  No gtts.  BS 245 2 days ago.  A1C 11.0.       Last edited by Bernarda Caffey, MD on 05/20/2020  9:30 AM. (History)    pt   Referring physician: McDiarmid, Blane Ohara, MD [place],  [place] [address]  HISTORICAL INFORMATION:   Selected notes from the [place] Patient referred by Dr. Jola Schmidt for diabetic retinal eval   CURRENT MEDICATIONS: No current outpatient medications on file. (Ophthalmic Drugs)   No current facility-administered medications for this visit. (Ophthalmic Drugs)   Current Outpatient Medications (Other)  Medication Sig  . acetaminophen (TYLENOL) 500 MG tablet Take 500 mg by mouth every 6 (six) hours as needed.  Marland Kitchen atorvastatin (LIPITOR) 40 MG tablet Take 1 tablet (40 mg total) by mouth 2 (two) times a week.  . carvedilol (COREG) 25 MG tablet TAKE ONE TABLET BY MOUTH TWICE A DAY WITH MEALS  . gabapentin (NEURONTIN) 100 MG capsule TAKE ONE CAPSULE BY MOUTH EVERY NIGHT AT BEDTIME  . glucose blood (BAYER CONTOUR NEXT TEST) test strip Use to check blood sugar three times a day or as directed.  Marland Kitchen ibuprofen (ADVIL) 400 MG tablet Take by mouth daily as needed. (Patient not taking: No sig reported)  . Insulin  Glargine (BASAGLAR KWIKPEN) 100 UNIT/ML Inject 0.28 mLs (28 Units total) into the skin daily.  . Insulin Pen Needle (TECHLITE PEN NEEDLES) 32G X 6 MM MISC Use to inject insulin daily as instructed  . Lancets MISC Use Contour Next Lancet to check blood glucose 3 times a day.  Dx code E11.65.  Marland Kitchen linaclotide (LINZESS) 145 MCG CAPS capsule Take 1 capsule (145 mcg total) by mouth daily.  Marland Kitchen losartan-hydrochlorothiazide (HYZAAR) 100-25 MG tablet TAKE ONE TABLET BY MOUTH DAILY  . metFORMIN (GLUCOPHAGE) 500 MG tablet TAKE ONE TABLET BY MOUTH TWICE A DAY WITH A MEAL  . omeprazole (PRILOSEC) 20 MG capsule Take 1 capsule (20 mg total) by mouth daily.  . Prenatal Vit-Fe Fum-Fe Bisg-FA (NATACHEW) 28-1 MG CHEW Chew 1 tablet by mouth at bedtime.   . Semaglutide, 1 MG/DOSE, 4 MG/3ML SOPN Inject 1 mg into the skin once a week.  . spironolactone (ALDACTONE) 25 MG tablet TAKE ONE TABLET BY MOUTH DAILY  . Syringe, Disposable, 1 ML MISC 1 each by Does not apply route 3 (three) times daily.   No current facility-administered medications for this visit. (Other)      REVIEW OF SYSTEMS: ROS    Positive for: Gastrointestinal, Genitourinary, Endocrine, Eyes, Respiratory   Negative for: Constitutional, Neurological,  Skin, Musculoskeletal, HENT, Cardiovascular, Psychiatric, Allergic/Imm, Heme/Lymph   Last edited by Matthew Folks, COA on 05/20/2020  8:36 AM. (History)       ALLERGIES Allergies  Allergen Reactions  . Ace Inhibitors Cough  . Lipitor [Atorvastatin] Other (See Comments)    Muscle aches in thighs  . Norvasc [Amlodipine] Swelling    edema  . Penicillins Itching and Rash    Able to tolerate cephalosporins  . Metformin And Related     Malaise. Pt states this occurs when taking a high dosage.    PAST MEDICAL HISTORY Past Medical History:  Diagnosis Date  . Allergy    seasonal sinus allergies  . ANEMIA, IRON DEFICIENCY, UNSPEC. 04/04/2006   Qualifier: Diagnosis of  By: Samara Snide    .  Bilateral senile cataracts 11/20/2018   Mixed OU  . Binge-eating disorder, moderate 03/17/2013  . Breast lump 07/22/2009   Qualifier: Diagnosis of  By: Netty Starring  MD, Lucianne Muss    . CKD stage 3 secondary to diabetes ([place]) 10/17/2018  . DEPRESSIVE DISORDER, NOS 04/04/2006   Qualifier: Diagnosis of  By: Samara Snide    . Diabetes mellitus    on meds  . Diabetic macular edema ([place])    by patient report 10/27/2019  . Diabetic neuropathy ([place]) 10/17/2018  . Diabetic peripheral neuropathy ([place])    per patient  . Diabetic visual loss: severe vision impairment of both eyes, with macular edema, with severe nonproliferative retinopathy, associated with type 2 diabetes mellitus ([place]) 11/20/2018      . Folliculitis 08/13/6281  . Heavy menses    Pt reports she was to get a hysterectomy  . Herpes labialis 09/06/2010  . Herpes simplex labialis   . Hypercalcemia 08/13/2019  . Hyperlipidemia    diet controlled/not taking meds for this  . Hypertension    on meds  . Hypertensive retinopathy    OU  . Menorrhagia 04/04/2006   Qualifier: Diagnosis of  By: Samara Snide    . Morbid obesity ([place]) 05/01/2013  . Neuromuscular disorder ([place])   . Obstructive sleep apnea 11/27/2019  . Osteoporosis    bilateral hips R>L  . Paroxysmal supraventricular tachycardia ([place]) 10/26/2014  . PONV (postoperative nausea and vomiting)   . POSITIVE PPD 10/18/2009   Qualifier: Diagnosis of  By: Zebedee Iba NP, Manuela Schwartz    . Prurigo nodularis 10/17/2018  . RHINITIS, ALLERGIC 04/04/2006   Qualifier: Diagnosis of  By: Samara Snide    . Sleep apnea   . TB (tuberculosis) contact    + skin test,/- chest x-ray - in mid 2000's  . Thrombocytosis 08/18/2019  . Tuberculosis    postitive 2011-[facility]-cleared per pt  . Tubular adenoma of colon 2021   2 mm sessile, Rikki Spearing MD ([facility])  . Visual impairment of left eye 08/18/2019   Past Surgical History:  Procedure Laterality Date  . REDUCTION MAMMAPLASTY    . TONSILLECTOMY    .  TONSILLECTOMY    . WISDOM TOOTH EXTRACTION      FAMILY HISTORY Family History  Problem Relation Age of Onset  . Hypertension Mother   . Diabetes Mellitus II Mother   . Congestive Heart Failure Mother        Deceased from complications of CHF  . Hypertension Father   . Diabetes Mellitus II Father   . Colon polyps Father 31  . Colon cancer Father 47  . Diabetes Mellitus II Brother   . Colon polyps Brother 41  . Colon polyps  Maternal Uncle 62  . Colon cancer Maternal Uncle 62  . Esophageal cancer Neg Hx   . Stomach cancer Neg Hx   . Rectal cancer Neg Hx     SOCIAL HISTORY Social History   Tobacco Use  . Smoking status: Never Smoker  . Smokeless tobacco: Never Used  Vaping Use  . Vaping Use: Never used  Substance Use Topics  . Alcohol use: Yes    Comment: Occas  . Drug use: No         OPHTHALMIC EXAM:  Base Eye Exam    Visual Acuity (Snellen - Linear)      Right Left   Dist [place] 20/80 -2 20/70 -2   Dist ph [place] 20/30 -2 20/40 -2       Tonometry (Tonopen, 8:39 AM)      Right Left   Pressure 14 12       Pupils      Dark Light Shape React APD   Right 3 2 Round Brisk None   Left 3 2 Round Brisk None       Visual Fields (Counting fingers)      Left Right    Full Full       Extraocular Movement      Right Left    Full, Ortho Full, Ortho       Neuro/Psych    Oriented x3: Yes   Mood/Affect: Normal       Dilation    Both eyes: 1.0% Mydriacyl, 2.5% Phenylephrine @ 8:39 AM        Slit Lamp and Fundus Exam    Slit Lamp Exam      Right Left   Lids/Lashes Dermatochalasis - upper lid Dermatochalasis - upper lid   Conjunctiva/Sclera Mild Melanosis Mild Melanosis   Cornea trace i8nferior PEE trace inferior PEE   Anterior Chamber Deep and quiet Deep and quiet   Iris Round and dilated Round and dilated   Lens 2+ Nuclear sclerosis, 2+ Cortical cataract, trace Posterior subcapsular cataract 2+ Nuclear sclerosis, 2+ Cortical cataract, trace PSC   Vitreous  Mild Vitreous syneresis Mild Vitreous syneresis       Fundus Exam      Right Left   Disc Pink and Sharp, +cupping Pink and Sharp, +cupping   C/D Ratio 0.6 0.6   Macula Blunted foveal reflex, +MA, +exudates / edema greatest temporal macula Flat, Blunted foveal reflex, +Microaneurysms and exudates, +edema central and temporal macula   Vessels attenuated, mild tortuousity, mild Copper wiring attenuated, Tortuous, mild Copper wiring, +NV along IN arcades   Periphery Attached, scattered MA Attached, scattered MA, 360 PRP          IMAGING AND PROCEDURES  Imaging and Procedures for @TODAY @  OCT, Retina - OU - Both Eyes       Right Eye Quality was good. Central Foveal Thickness: 229. Progression has improved. Findings include normal foveal contour, intraretinal fluid, intraretinal hyper-reflective material, no SRF (Persistent cystic changes / edema temporal macula).   Left Eye Quality was good. Central Foveal Thickness: 428. Progression has worsened. Findings include abnormal foveal contour, intraretinal fluid, intraretinal hyper-reflective material, no SRF, vitreomacular adhesion  (Mild Interval increase in IRF ).   Notes *Images captured and stored on drive  Diagnosis / Impression:  NFP, no SRF OU OD: Persistent cystic changes / edema temporal macula OS: Mild Interval increase in IRF   Clinical management:  See below  Abbreviations: NFP - Normal foveal profile. CME - cystoid macular edema.  PED - pigment epithelial detachment. IRF - intraretinal fluid. SRF - subretinal fluid. EZ - ellipsoid zone. ERM - epiretinal membrane. ORA - outer retinal atrophy. ORT - outer retinal tubulation. SRHM - subretinal hyper-reflective material        Intravitreal Injection, Pharmacologic Agent - OS - Left Eye       Time Out 05/20/2020. 8:57 AM. Confirmed correct patient, procedure, site, and patient consented.   Anesthesia Topical anesthesia was used. Anesthetic medications included  Lidocaine 2%, Proparacaine 0.5%.   Procedure Preparation included 5% betadine to ocular surface, eyelid speculum. A supplied (32g) needle was used.   Injection:  1.25 mg Bevacizumab (AVASTIN) 1.50m/0.05mL SOLN   NDC: 594709-628-36 Lot:: [id number] Expiration date: 06/26/2020   Route: Intravitreal, Site: Left Eye, Waste: 0.05 mL  Post-op Post injection exam found visual acuity of at least counting fingers. The patient tolerated the procedure well. There were no complications. The patient received written and verbal post procedure care education.        Intravitreal Injection, Pharmacologic Agent - OD - Right Eye       Time Out 05/20/2020. 8:57 AM. Confirmed correct patient, procedure, site, and patient consented.   Anesthesia Topical anesthesia was used. Anesthetic medications included Lidocaine 2%, Proparacaine 0.5%.   Procedure A supplied needle was used.   Injection:  1.25 mg Bevacizumab (AVASTIN) 1.233m0.05mL SOLN   NDC: 5046503-546-56Lot: [id number] , Expiration date: 06/19/2020   Route: Intravitreal, Site: Right Eye, Waste: 0 mL  Post-op Post injection exam found visual acuity of at least counting fingers. The patient tolerated the procedure well. There were no complications. The patient received written and verbal post procedure care education.               ASSESSMENT/PLAN:    ICD-10-CM   1. Severe nonproliferative diabetic retinopathy of right eye with macular edema associated with type 2 diabetes mellitus (HCC)  E15/28/2022ntravitreal Injection, Pharmacologic Agent - OD - Right Eye    Bevacizumab (AVASTIN) SOLN 1.25 mg  2. Proliferative diabetic retinopathy of left eye with macular edema associated with type 2 diabetes mellitus (HCC)  E1O12.2482ntravitreal Injection, Pharmacologic Agent - OS - Left Eye    Bevacizumab (AVASTIN) SOLN 1.25 mg  3. Retinal edema  H35.81 OCT, Retina - OU - Both Eyes  4. Essential hypertension  I10   5. Hypertensive retinopathy of both  eyes  H35.033   6. Combined forms of age-related cataract of both eyes  H25.813     1-3. Severe nonproliferative diabetic retinopathy, OD  - OS -- interval conversion to PDR -- noted 3.18.22  - lost to f/u here since 01/2019 -- s/p IVA OS #1 (12.15.20), #2 (03.18.22)  [facility] [place] [place] has not been seen since August of 2018 -- s/p multiple IVA OU  - s/p IVA OS #1 (12.16.20), #2 (03.18.22)  - s/p PRP OS (04.01.22)  - exam shows scattered IRH and exudates OU  - OCT shows Interval improvement in temporal edema OD, slightly worse OS  - BCVA 20/30 OD, 20/40 OS (improved OU)  - FA 12.15.20 shows scattered leaking MA OU, vascular perfusion defects OU, no NV OU  - repeat FA (03.18.22) shows new retinal NV OS -- s/p PRP OS 04.01.22  - recommend IVA OU (OD#1 and OS #3) today, 04.15.22 for DME  - pt wishes to proceed with injection  - RBA of procedure discussed, questions answered  - informed consent obtained and signed  -  see procedure note  - will need PRP OD for patches of capillary nonperfusion -- will plan for 6 weeks  - f/u 4 weeks, DFE, OCT, possible injections  4,5. Hypertensive retinopathy OU  - discussed importance of tight BP control  - monitor  6. Mixed form age related cataract OU -- mild  - under the expert management of Dr. Valetta Close  - The symptoms of cataract, surgical options, and treatments and risks were discussed with patient.  - discussed diagnosis and progression and association of PSC with DM   Ophthalmic Meds Ordered this visit:  Meds ordered this encounter  Medications  . Bevacizumab (AVASTIN) SOLN 1.25 mg  . Bevacizumab (AVASTIN) SOLN 1.25 mg       Return in about 4 weeks (around 06/17/2020) for f/u PDR OU, DFE, OCT.  There are no Patient Instructions on file for this visit.    This document serves as a record of services personally performed by Gardiner Sleeper, MD, PhD. It was created on  their behalf by San Jetty. Owens Shark, OA an ophthalmic technician. The creation of this record is the provider's dictation and/or activities during the visit.    Electronically signed by: San Jetty. Owens Shark, [place] 04.08.2022 1:05 PM  Gardiner Sleeper, M.D., Ph.D. Diseases & Surgery of the Retina and Vitreous [facility]  I have reviewed the above documentation for accuracy and completeness, and I agree with the above. Gardiner Sleeper, M.D., Ph.D. 05/20/20 1:05 PM   Abbreviations: M myopia (nearsighted); A astigmatism; H hyperopia (farsighted); P presbyopia; Mrx spectacle prescription;  CTL contact lenses; OD right eye; OS left eye; OU both eyes  XT exotropia; ET esotropia; PEK punctate epithelial keratitis; PEE punctate epithelial erosions; DES dry eye syndrome; MGD meibomian gland dysfunction; ATs artificial tears; PFAT's preservative free artificial tears; [place] nuclear sclerotic cataract; PSC posterior subcapsular cataract; ERM epi-retinal membrane; PVD posterior vitreous detachment; RD retinal detachment; DM diabetes mellitus; DR diabetic retinopathy; NPDR non-proliferative diabetic retinopathy; PDR proliferative diabetic retinopathy; CSME clinically significant macular edema; DME diabetic macular edema; dbh dot blot hemorrhages; CWS cotton wool spot; POAG primary open angle glaucoma; C/D cup-to-disc ratio; HVF humphrey visual field; GVF goldmann visual field; OCT optical coherence tomography; IOP intraocular pressure; BRVO Branch retinal vein occlusion; CRVO central retinal vein occlusion; CRAO central retinal artery occlusion; BRAO branch retinal artery occlusion; RT retinal tear; SB scleral buckle; PPV pars plana vitrectomy; VH Vitreous hemorrhage; PRP panretinal laser photocoagulation; IVK intravitreal kenalog; VMT vitreomacular traction; MH Macular hole;  NVD neovascularization of the disc; NVE neovascularization elsewhere; AREDS age related eye disease study; ARMD age related macular  degeneration; POAG primary open angle glaucoma; EBMD epithelial/anterior basement membrane dystrophy; ACIOL anterior chamber intraocular lens; IOL intraocular lens; PCIOL posterior chamber intraocular lens; Phaco/IOL phacoemulsification with intraocular lens placement; [place] photorefractive keratectomy; LASIK laser assisted in situ keratomileusis; HTN hypertension; DM diabetes mellitus; COPD chronic obstructive pulmonary disease

## 2020-05-17 ENCOUNTER — Telehealth: Payer: Self-pay

## 2020-05-17 ENCOUNTER — Telehealth: Payer: Self-pay | Admitting: Pharmacist

## 2020-05-17 DIAGNOSIS — Z1231 Encounter for screening mammogram for malignant neoplasm of breast: Secondary | ICD-10-CM

## 2020-05-17 NOTE — Telephone Encounter (Signed)
Patient returning call from 05/06/20 to discuss blood glucose readings.  Patient states her blood glucose has been "high, in the 280's most mornings". She stated this morning she did not feel well and it was 245. She complains of nausea and believes this may be caused from the Ernest, as this started after re-initiation. Patient took dose 3 of Ozempic 0.27m yesterday. Her lowest blood glucose has been around 178.   Current diabetes regimen: Insulin glargine (BASAGLAR) 34 units once daily Metformin 5087mBID Ozempic 0.2545mnce weekly  Patient is concerned about her elevated blood glucose readings. "I need them as low as possible" as she states she has an upcoming eye appointment.   Discussed with patient that we can decrease her Ozempic dose by a few clicks until tolerant and in the meantime increase her BASAGLAR. Patient was not agreeable with plan. Phone line disconnected and was unable to further discuss other options for plan with patient. Attempt to reach patient back was unsuccessful and voicemail was left requesting a return call.

## 2020-05-17 NOTE — Telephone Encounter (Signed)
Patient calls nurse line requesting that provider place order for mammogram. Patient was last seen by PCP on 02/25/20. Per chart review, patient had last mammogram on 12/08/2018, results indicated for patient to have screening mammogram in one year.   Please advise.   Talbot Grumbling, RN

## 2020-05-17 NOTE — Telephone Encounter (Signed)
Order for mammogram sent

## 2020-05-20 ENCOUNTER — Other Ambulatory Visit: Payer: Self-pay

## 2020-05-20 ENCOUNTER — Encounter (INDEPENDENT_AMBULATORY_CARE_PROVIDER_SITE_OTHER): Payer: Self-pay | Admitting: Ophthalmology

## 2020-05-20 ENCOUNTER — Ambulatory Visit (INDEPENDENT_AMBULATORY_CARE_PROVIDER_SITE_OTHER): Payer: 59 | Admitting: Ophthalmology

## 2020-05-20 DIAGNOSIS — H3581 Retinal edema: Secondary | ICD-10-CM | POA: Diagnosis not present

## 2020-05-20 DIAGNOSIS — H35033 Hypertensive retinopathy, bilateral: Secondary | ICD-10-CM

## 2020-05-20 DIAGNOSIS — E113411 Type 2 diabetes mellitus with severe nonproliferative diabetic retinopathy with macular edema, right eye: Secondary | ICD-10-CM

## 2020-05-20 DIAGNOSIS — E113512 Type 2 diabetes mellitus with proliferative diabetic retinopathy with macular edema, left eye: Secondary | ICD-10-CM | POA: Diagnosis not present

## 2020-05-20 DIAGNOSIS — E113413 Type 2 diabetes mellitus with severe nonproliferative diabetic retinopathy with macular edema, bilateral: Secondary | ICD-10-CM

## 2020-05-20 DIAGNOSIS — I10 Essential (primary) hypertension: Secondary | ICD-10-CM | POA: Diagnosis not present

## 2020-05-20 DIAGNOSIS — H25813 Combined forms of age-related cataract, bilateral: Secondary | ICD-10-CM

## 2020-05-20 MED ORDER — BEVACIZUMAB CHEMO INJECTION 1.25MG/0.05ML SYRINGE FOR KALEIDOSCOPE
1.2500 mg | INTRAVITREAL | Status: AC | PRN
  Administered 2020-05-20: 1.25 mg via INTRAVITREAL

## 2020-05-24 ENCOUNTER — Other Ambulatory Visit: Payer: Self-pay | Admitting: Nephrology

## 2020-05-24 DIAGNOSIS — N1831 Chronic kidney disease, stage 3a: Secondary | ICD-10-CM

## 2020-06-08 ENCOUNTER — Ambulatory Visit
Admission: RE | Admit: 2020-06-08 | Discharge: 2020-06-08 | Disposition: A | Discharge status: Home / Self Care | Payer: 59 | Source: Ambulatory Visit | Attending: Nephrology | Admitting: Nephrology

## 2020-06-08 DIAGNOSIS — N1831 Chronic kidney disease, stage 3a: Secondary | ICD-10-CM

## 2020-06-08 IMAGING — US US RENAL
1 series · 14 of 25 positions shown · non-contrast
Comparison: None.

CLINICAL DATA: Stage III A chronic renal disease.

EXAM:
RENAL / URINARY TRACT ULTRASOUND COMPLETE

[Series 1: us renal · 0.23mm/px · 14 of 64 slices shown]
[im 1/64]
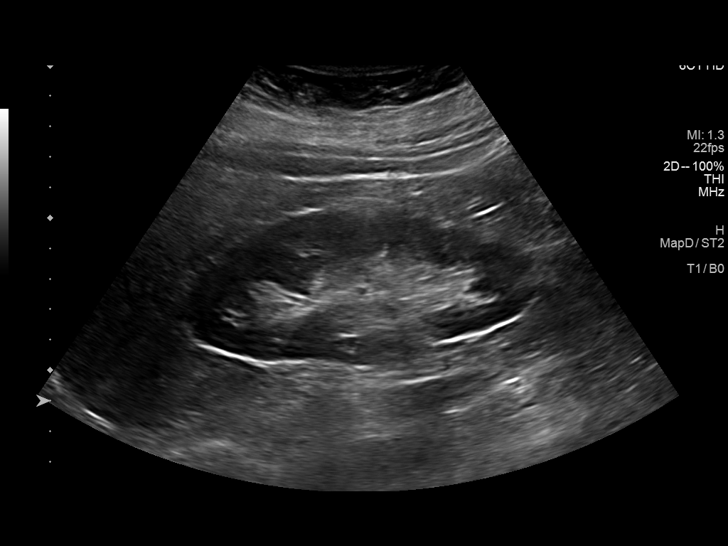
[im 6/64]
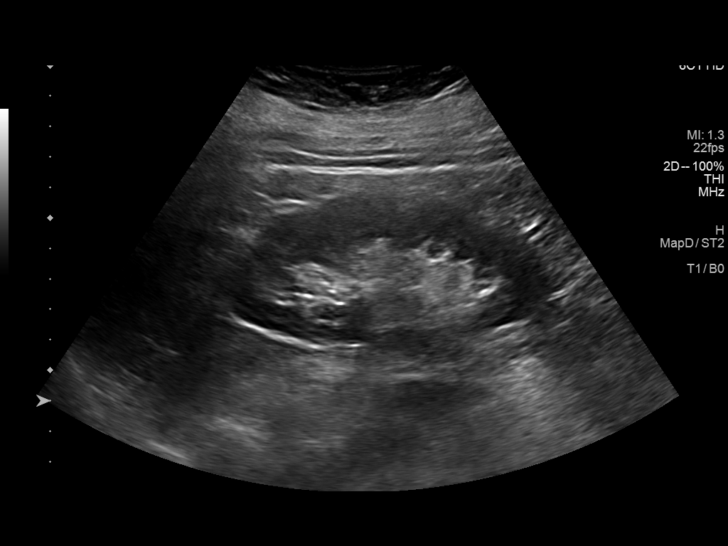
[im 11/64]
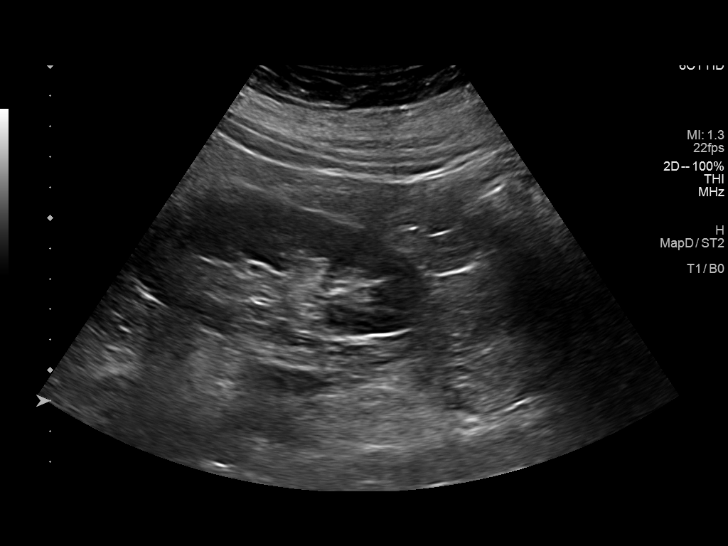
[im 16/64]
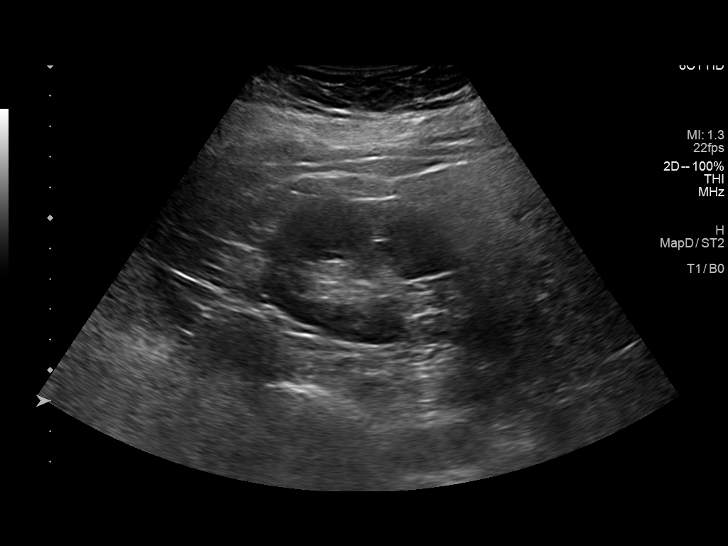
[im 22/64]
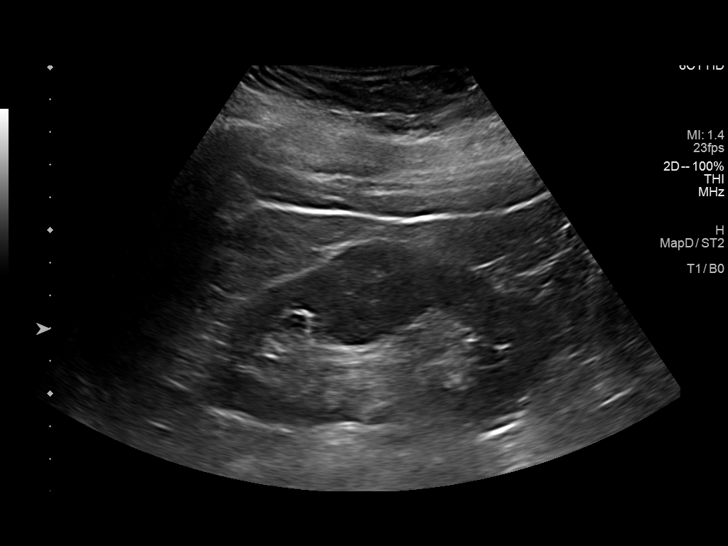
[im 24/64]
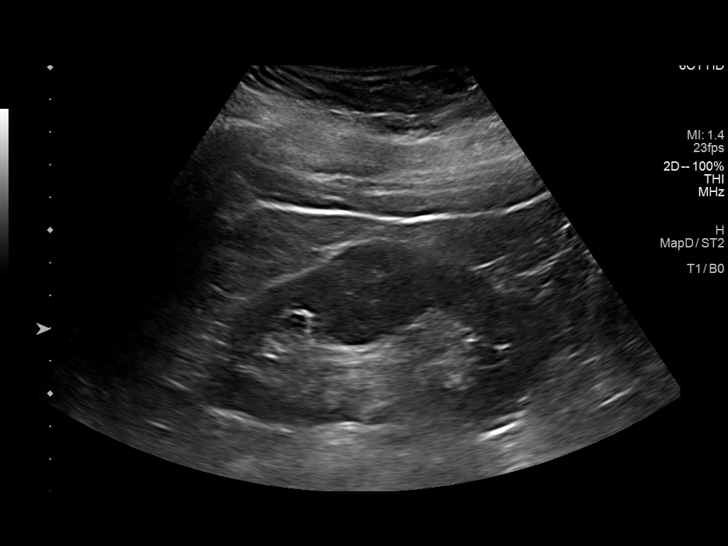
[im 29/64]
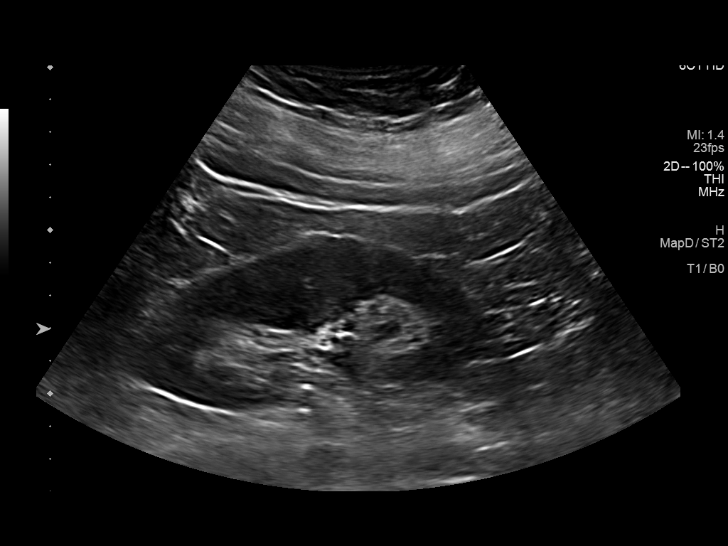
[im 35/64]
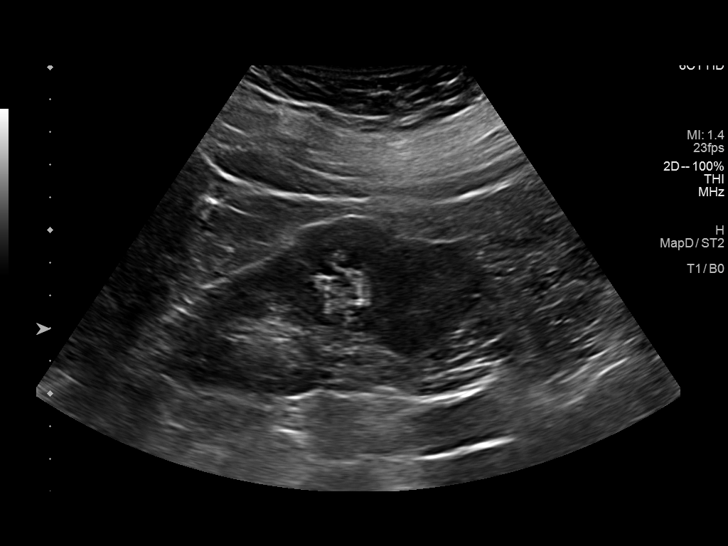
[im 40/64]
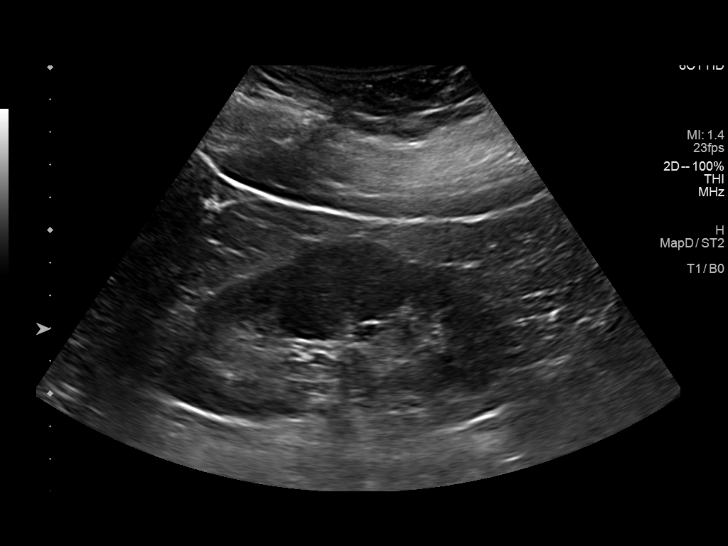
[im 43/64]
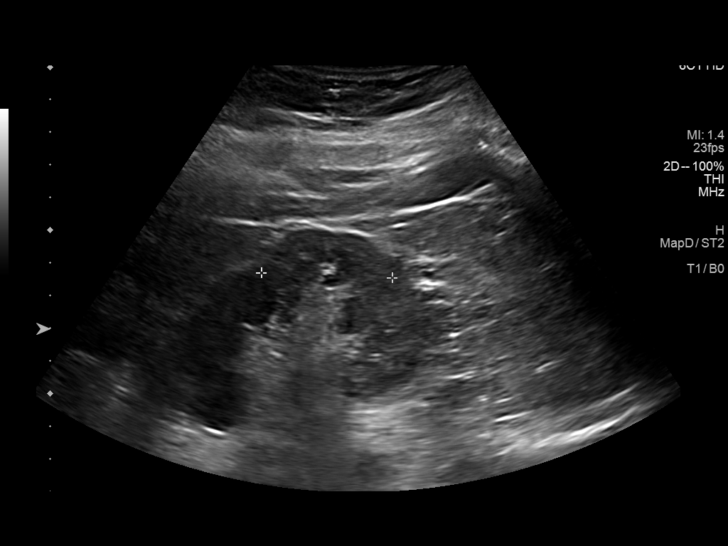
[im 48/64]
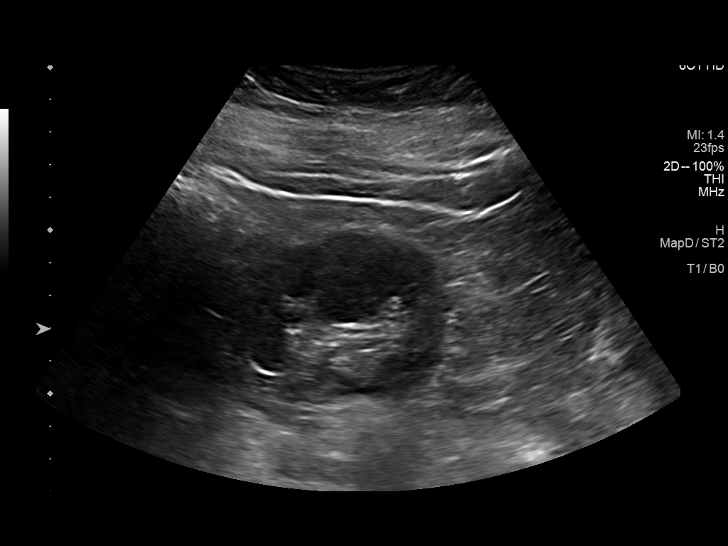
[im 53/64]
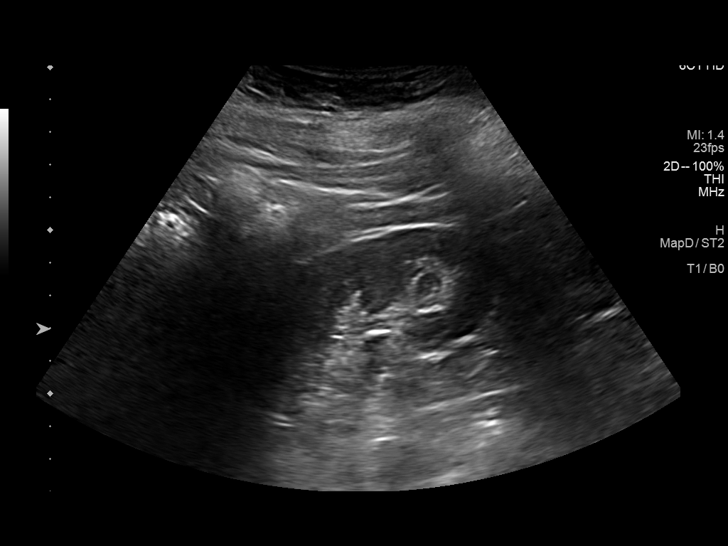
[im 58/64]
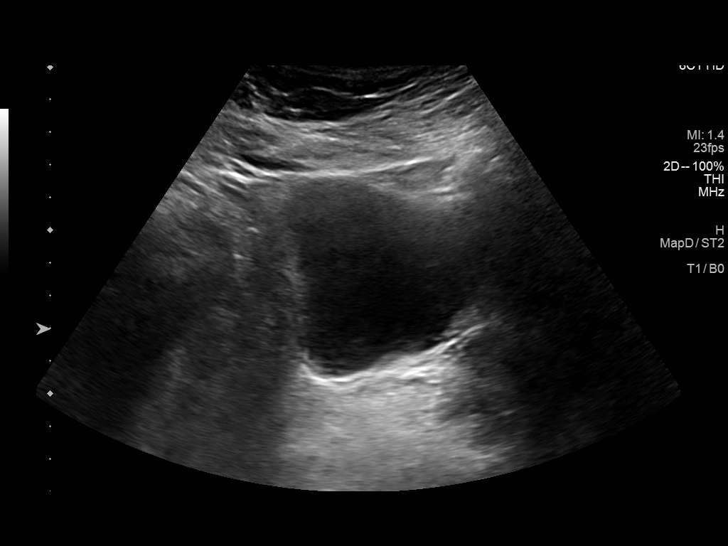
[im 64/64]
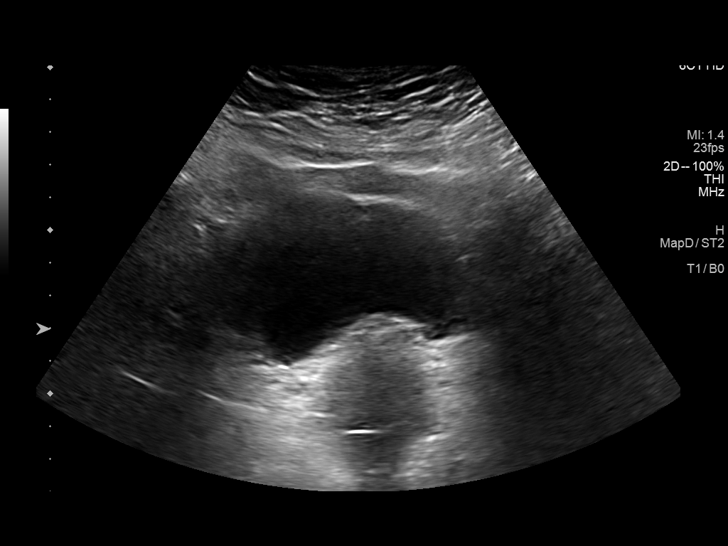

[14 of 25 positions shown; findings below may reference images not displayed]

FINDINGS: Right Kidney:

Renal measurements: 11.8 cm x 5.3 cm x 6.7 cm = volume: 221 mL.
Echogenicity within normal limits. No mass or hydronephrosis
visualized.

Left Kidney:

Renal measurements: 11.0 cm x 5.7 cm x 8.0 cm = volume: 280 mL.
Echogenicity within normal limits. A 7 mm shadowing echogenic focus
is seen within the mid left kidney. A 4.7 cm x 3.2 cm x 4.0 cm area
of prominent appearing renal parenchyma is seen within the mid left
kidney. No hydronephrosis is visualized.

Bladder:

Appears normal for degree of bladder distention.

Other:

None.
IMPRESSION: 1. 7 mm nonobstructing renal stone within the left kidney.
2. Prominent area of renal parenchyma within the mid left kidney
which may represent a renal dromedary hump.

## 2020-06-14 ENCOUNTER — Other Ambulatory Visit (INDEPENDENT_AMBULATORY_CARE_PROVIDER_SITE_OTHER): Payer: 59

## 2020-06-14 ENCOUNTER — Other Ambulatory Visit: Payer: Self-pay

## 2020-06-14 ENCOUNTER — Other Ambulatory Visit: Payer: Self-pay | Admitting: Family Medicine

## 2020-06-14 DIAGNOSIS — R300 Dysuria: Secondary | ICD-10-CM | POA: Diagnosis not present

## 2020-06-14 LAB — POCT UA - MICROSCOPIC ONLY

## 2020-06-14 LAB — POCT URINALYSIS DIP (CLINITEK)
Bilirubin, UA: NEGATIVE
Blood, UA: NEGATIVE
Glucose, UA: 500 mg/dL — AB
Ketones, POC UA: NEGATIVE mg/dL
Nitrite, UA: NEGATIVE
POC PROTEIN,UA: NEGATIVE
Spec Grav, UA: 1.015 (ref 1.010–1.025)
Urobilinogen, UA: 0.2 E.U./dL
pH, UA: 5.5 (ref 5.0–8.0)

## 2020-06-14 MED ORDER — FLUCONAZOLE 150 MG PO TABS: 150.0000 mg | ORAL_TABLET | Freq: Once | ORAL | 0 refills | Status: AC

## 2020-06-14 NOTE — Telephone Encounter (Addendum)
Patient calls nurse line to discuss symptoms further. Patient has been experiencing increased urination, dysuria, nausea, vaginal irritation and thick, white discharge. Advised patient that she should schedule appointment with provider for further evaluation of concern.   Patient reports limited availability to come into the office due to work schedule. Patient is requesting to come in to just provide urine sample. Will forward to PCP for next steps.   Talbot Grumbling, RN

## 2020-06-14 NOTE — Telephone Encounter (Signed)
Returned call to patient. Patient denies fever and chills. Reports temperature of 97. During previous conversation, reported "feeling chilled" At this time patient believes this is related to colder temperature outside. Advised patient that if she developed chills or fever that she would need to be evaluated by provider. Patient verbalized understanding.   Patient will come in this morning for UA.   Return precautions given.   Talbot Grumbling, RN

## 2020-06-14 NOTE — Progress Notes (Signed)
Called and discussed.  UA looks OK so will treat presumptively as a yeast vaginitis.

## 2020-06-14 NOTE — Progress Notes (Signed)
Patient to come into office for UA, per Dr. Lowella Bandy orders.   Scheduled patient for 1030 on 5/10.  Talbot Grumbling, RN

## 2020-06-15 NOTE — Progress Notes (Signed)
[facility] Note  06/17/2020     CHIEF COMPLAINT Patient presents for Retina Follow Up   HISTORY OF PRESENT ILLNESS: Alicia West is a 50 y.o. female who presents to the clinic today for:   HPI    Retina Follow Up    Patient presents with  Diabetic Retinopathy.  In both eyes.  Duration of 4 weeks.  I, the attending physician,  performed the HPI with the patient and updated documentation appropriately.          Comments    When she first wakes up her vision is blurred.  Unable to see her phone or anything she needs to read.  It will take about 15-20 minutes before it clears.  BS 174  A1C 11 PCP added Jardiance, she has not started yet.        Last edited by Bernarda Caffey, MD on 06/17/2020  4:40 PM. (History)    pt states vision is very blurry in the morning, she is unable to see her phone when she wakes up, she states her blood sugar has been running high, but her blood pressure has been good, pt was given a 2 week sample of farxiga, but had a bad reaction to it, so she is going to start Jardiance instead  Referring physician: McDiarmid, Blane Ohara, MD [place],  [place] [address]  HISTORICAL INFORMATION:   Selected notes from the [place] Patient referred by Dr. Jola Schmidt for diabetic retinal eval   CURRENT MEDICATIONS: No current outpatient medications on file. (Ophthalmic Drugs)   No current facility-administered medications for this visit. (Ophthalmic Drugs)   Current Outpatient Medications (Other)  Medication Sig  . acetaminophen (TYLENOL) 500 MG tablet Take 500 mg by mouth every 6 (six) hours as needed.  Marland Kitchen atorvastatin (LIPITOR) 40 MG tablet Take 1 tablet (40 mg total) by mouth 2 (two) times a week.  . carvedilol (COREG) 25 MG tablet TAKE ONE TABLET BY MOUTH TWICE A DAY WITH MEALS  . gabapentin (NEURONTIN) 100 MG capsule TAKE ONE CAPSULE BY MOUTH EVERY NIGHT AT BEDTIME  . Insulin Glargine (BASAGLAR  KWIKPEN) 100 UNIT/ML Inject 0.28 mLs (28 Units total) into the skin daily.  . Insulin Pen Needle (TECHLITE PEN NEEDLES) 32G X 6 MM MISC Use to inject insulin daily as instructed  . linaclotide (LINZESS) 145 MCG CAPS capsule Take 1 capsule (145 mcg total) by mouth daily.  Marland Kitchen losartan-hydrochlorothiazide (HYZAAR) 100-25 MG tablet TAKE ONE TABLET BY MOUTH DAILY  . metFORMIN (GLUCOPHAGE) 500 MG tablet TAKE ONE TABLET BY MOUTH TWICE A DAY WITH A MEAL  . omeprazole (PRILOSEC) 20 MG capsule Take 1 capsule (20 mg total) by mouth daily.  . Prenatal Vit-Fe Fum-Fe Bisg-FA (NATACHEW) 28-1 MG CHEW Chew 1 tablet by mouth at bedtime.   . Semaglutide, 1 MG/DOSE, 4 MG/3ML SOPN Inject 1 mg into the skin once a week.  . spironolactone (ALDACTONE) 25 MG tablet TAKE ONE TABLET BY MOUTH DAILY  . Syringe, Disposable, 1 ML MISC 1 each by Does not apply route 3 (three) times daily.  Marland Kitchen glucose blood (BAYER CONTOUR NEXT TEST) test strip Use to check blood sugar three times a day or as directed.  Marland Kitchen ibuprofen (ADVIL) 400 MG tablet Take by mouth daily as needed. (Patient not taking: No sig reported)  . Lancets MISC Use Contour Next Lancet to check blood glucose 3 times a day.  Dx code E11.65. (Patient not taking:  Reported on 06/17/2020)   No current facility-administered medications for this visit. (Other)      REVIEW OF SYSTEMS: ROS    Positive for: Gastrointestinal, Genitourinary, Endocrine, Eyes, Respiratory   Negative for: Constitutional, Neurological, Skin, Musculoskeletal, HENT, Cardiovascular, Psychiatric, Allergic/Imm, Heme/Lymph   Last edited by Leonie Douglas, COA on 06/17/2020  7:56 AM. (History)       ALLERGIES Allergies  Allergen Reactions  . Ace Inhibitors Cough  . Lipitor [Atorvastatin] Other (See Comments)    Muscle aches in thighs  . Norvasc [Amlodipine] Swelling    edema  . Penicillins Itching and Rash    Able to tolerate cephalosporins  . Metformin And Related     Malaise. Pt states this  occurs when taking a high dosage.    PAST MEDICAL HISTORY Past Medical History:  Diagnosis Date  . Allergy    seasonal sinus allergies  . ANEMIA, IRON DEFICIENCY, UNSPEC. 04/04/2006   Qualifier: Diagnosis of  By: Samara Snide    . Bilateral senile cataracts 11/20/2018   Mixed OU  . Binge-eating disorder, moderate 03/17/2013  . Breast lump 07/22/2009   Qualifier: Diagnosis of  By: Netty Starring  MD, Lucianne Muss    . CKD stage 3 secondary to diabetes ([place]) 10/17/2018  . DEPRESSIVE DISORDER, NOS 04/04/2006   Qualifier: Diagnosis of  By: Samara Snide    . Diabetes mellitus    on meds  . Diabetic macular edema ([place])    by patient report 10/27/2019  . Diabetic neuropathy ([place]) 10/17/2018  . Diabetic peripheral neuropathy ([place])    per patient  . Diabetic visual loss: severe vision impairment of both eyes, with macular edema, with severe nonproliferative retinopathy, associated with type 2 diabetes mellitus ([place]) 11/20/2018      . Folliculitis 8/50/2774  . Heavy menses    Pt reports she was to get a hysterectomy  . Herpes labialis 09/06/2010  . Herpes simplex labialis   . Hypercalcemia 08/13/2019  . Hyperlipidemia    diet controlled/not taking meds for this  . Hypertension    on meds  . Hypertensive retinopathy    OU  . Menorrhagia 04/04/2006   Qualifier: Diagnosis of  By: Samara Snide    . Morbid obesity ([place]) 05/01/2013  . Neuromuscular disorder ([place])   . Obstructive sleep apnea 11/27/2019  . Osteoporosis    bilateral hips R>L  . Paroxysmal supraventricular tachycardia ([place]) 10/26/2014  . PONV (postoperative nausea and vomiting)   . POSITIVE PPD 10/18/2009   Qualifier: Diagnosis of  By: Zebedee Iba NP, Manuela Schwartz    . Prurigo nodularis 10/17/2018  . RHINITIS, ALLERGIC 04/04/2006   Qualifier: Diagnosis of  By: Samara Snide    . Sleep apnea   . TB (tuberculosis) contact    + skin test,/- chest x-ray - in mid 2000's  . Thrombocytosis 08/18/2019  . Tuberculosis    postitive 2011-tx'd at Health  department-cleared per pt  . Tubular adenoma of colon 2021   2 mm sessile, Rikki Spearing MD ([facility])  . Visual impairment of left eye 08/18/2019   Past Surgical History:  Procedure Laterality Date  . REDUCTION MAMMAPLASTY    . TONSILLECTOMY    . TONSILLECTOMY    . WISDOM TOOTH EXTRACTION      FAMILY HISTORY Family History  Problem Relation Age of Onset  . Hypertension Mother   . Diabetes Mellitus II Mother   . Congestive Heart Failure Mother        Deceased from complications of CHF  . Hypertension Father   .  Diabetes Mellitus II Father   . Colon polyps Father 6  . Colon cancer Father 50  . Diabetes Mellitus II Brother   . Colon polyps Brother 29  . Colon polyps Maternal Uncle 62  . Colon cancer Maternal Uncle 62  . Esophageal cancer Neg Hx   . Stomach cancer Neg Hx   . Rectal cancer Neg Hx     SOCIAL HISTORY Social History   Tobacco Use  . Smoking status: Never Smoker  . Smokeless tobacco: Never Used  Vaping Use  . Vaping Use: Never used  Substance Use Topics  . Alcohol use: Yes    Comment: Occas  . Drug use: No         OPHTHALMIC EXAM:  Base Eye Exam    Visual Acuity (Snellen - Linear)      Right Left   Dist [place] 20/70 20/70   Dist ph [place] 20/30 -2 20/50 +1       Tonometry (Tonopen, 8:04 AM)      Right Left   Pressure 20 18       Pupils      Dark Light Shape React APD   Right 3 2 Round Brisk None   Left 3 2 Round Brisk None       Visual Fields (Counting fingers)      Left Right    Full Full       Extraocular Movement      Right Left    Full Full       Neuro/Psych    Oriented x3: Yes   Mood/Affect: Normal       Dilation    Both eyes: 1.0% Mydriacyl, 2.5% Phenylephrine @ 8:04 AM        Slit Lamp and Fundus Exam    Slit Lamp Exam      Right Left   Lids/Lashes Dermatochalasis - upper lid Dermatochalasis - upper lid   Conjunctiva/Sclera Mild Melanosis Mild Melanosis   Cornea trace i8nferior PEE trace inferior PEE   Anterior  Chamber Deep and quiet Deep and quiet   Iris Round and dilated Round and dilated   Lens 2+ Nuclear sclerosis, 2+ Cortical cataract, trace Posterior subcapsular cataract 2+ Nuclear sclerosis, 2+ Cortical cataract, trace PSC   Vitreous Mild Vitreous syneresis Mild Vitreous syneresis       Fundus Exam      Right Left   Disc Pink and Sharp, +cupping Pink and Sharp, +cupping   C/D Ratio 0.6 0.6   Macula Good foveal reflex, +MA, +exudates / edema greatest temporal macula Flat, Blunted foveal reflex, +Microaneurysms and exudates, +edema central and temporal macula   Vessels attenuated, mild tortuousity, mild Copper wiring attenuated, Tortuous, mild Copper wiring, +NV along IN arcades   Periphery Attached, scattered MA Attached, scattered MA, 360 PRP          IMAGING AND PROCEDURES  Imaging and Procedures for @TODAY @  OCT, Retina - OU - Both Eyes       Right Eye Quality was good. Central Foveal Thickness: 232. Progression has improved. Findings include normal foveal contour, intraretinal fluid, intraretinal hyper-reflective material, no SRF (Interval improvement in IRF/ IRHM temporal macula).   Left Eye Quality was good. Central Foveal Thickness: 459. Progression has worsened. Findings include abnormal foveal contour, intraretinal fluid, intraretinal hyper-reflective material, no SRF, vitreomacular adhesion  (Interval increase in central cystic changes, persistent IRF / IRHM).   Notes *Images captured and stored on drive  Diagnosis / Impression:  NFP, no SRF  OU OD: Interval improvement in IRF/ IRHM temporal macula OS: Interval increase in central cystic changes, persistent IRF / IRHM  Clinical management:  See below  Abbreviations: NFP - Normal foveal profile. CME - cystoid macular edema. PED - pigment epithelial detachment. IRF - intraretinal fluid. SRF - subretinal fluid. EZ - ellipsoid zone. ERM - epiretinal membrane. ORA - outer retinal atrophy. ORT - outer retinal tubulation.  SRHM - subretinal hyper-reflective material        Intravitreal Injection, Pharmacologic Agent - OD - Right Eye       Time Out 06/17/2020. 8:23 AM. Confirmed correct patient, procedure, site, and patient consented.   Anesthesia Topical anesthesia was used. Anesthetic medications included Lidocaine 2%, Proparacaine 0.5%.   Procedure Preparation included eyelid speculum, 5% betadine to ocular surface. A supplied needle was used.   Injection:  1.25 mg Bevacizumab (AVASTIN) 1.40m/0.05mL SOLN   NDC: 516109-604-54 Lot:: [id number] Expiration date: 07/18/2020   Route: Intravitreal, Site: Right Eye, Waste: 0 mL  Post-op Post injection exam found visual acuity of at least counting fingers. The patient tolerated the procedure well. There were no complications. The patient received written and verbal post procedure care education.        Intravitreal Injection, Pharmacologic Agent - OS - Left Eye       Time Out 06/17/2020. 8:23 AM. Confirmed correct patient, procedure, site, and patient consented.   Anesthesia Topical anesthesia was used. Anesthetic medications included Lidocaine 2%, Proparacaine 0.5%.   Procedure Preparation included 5% betadine to ocular surface, eyelid speculum. A supplied (32g) needle was used.   Injection:  1.25 mg Bevacizumab (AVASTIN) 1.293m0.05mL SOLN   NDC: 5047829-562-13Lot: : [id number] date: 08/17/2020   Route: Intravitreal, Site: Left Eye, Waste: 0.05 mL  Post-op Post injection exam found visual acuity of at least counting fingers. The patient tolerated the procedure well. There were no complications. The patient received written and verbal post procedure care education.               ASSESSMENT/PLAN:    ICD-10-CM   1. Severe nonproliferative diabetic retinopathy of right eye with macular edema associated with type 2 diabetes mellitus (HCC)  E1O96.2952ntravitreal Injection, Pharmacologic Agent - OD - Right Eye    Bevacizumab (AVASTIN)  SOLN 1.25 mg  2. Proliferative diabetic retinopathy of left eye with macular edema associated with type 2 diabetes mellitus (HCC)  E1W41.3244ntravitreal Injection, Pharmacologic Agent - OS - Left Eye    Bevacizumab (AVASTIN) SOLN 1.25 mg  3. Retinal edema  H35.81 OCT, Retina - OU - Both Eyes  4. Essential hypertension  I10   5. Hypertensive retinopathy of both eyes  H35.033   6. Combined forms of age-related cataract of both eyes  H25.813     1-3. Severe nonproliferative diabetic retinopathy, OD  - OS -- interval conversion to PDR -- noted 3.18.22  - lost to f/u here since 01/2019 -- s/p IVA OS #1 (12.15.20), #2 (03.18.22)  [facility] RaOval Linseynd ApConwaybut has not been seen since August of 2018 -- s/p multiple IVA OU  - s/p IVA OS #1 (12.16.20), #2 (03.18.22), #3 (04.15.22)  - s/p IVA OD #1 (04.15.22)  - s/p PRP OS (04.01.22)  - exam shows scattered IRH and exudates OU  - OCT shows OD: Interval improvement in IRF/ IRHM temporal macula; OS: Interval increase in central cystic changes, persistent IRF / IRHM  - BCVA 20/30 OD, 20/50 OS (decreased OS)  -  FA 12.15.20 shows scattered leaking MA OU, vascular perfusion defects OU, no NV OU  - repeat FA (03.18.22) shows new retinal NV OS -- s/p PRP OS 04.01.22  - recommend IVA OU (OD #2 and OS #4) today, 05.13.22 for DME  - pt wishes to proceed with injection  - RBA of procedure discussed, questions answered  - informed consent obtained and signed  - see procedure note  - will need PRP OD for patches of capillary nonperfusion -- will plan for 6 weeks (between injections)  - f/u 4 weeks, DFE, OCT, possible injections  4,5. Hypertensive retinopathy OU  - discussed importance of tight BP control  - monitor   6. Mixed form age related cataract OU -- mild  - under the expert management of Dr. Valetta Close  - The symptoms of cataract, surgical options, and treatments and risks were discussed with  patient.   - discussed diagnosis and progression and association of PSC with DM   Ophthalmic Meds Ordered this visit:  Meds ordered this encounter  Medications  . Bevacizumab (AVASTIN) SOLN 1.25 mg  . Bevacizumab (AVASTIN) SOLN 1.25 mg       Return in about 4 weeks (around 07/15/2020) for f/u NPDR OU, DFE, OCT.  There are no Patient Instructions on file for this visit.    This document serves as a record of services personally performed by Gardiner Sleeper, MD, PhD. It was created on their behalf by Leonie Douglas, an ophthalmic technician. The creation of this record is the provider's dictation and/or activities during the visit.    Electronically signed by: Leonie Douglas COA, 06/17/20  4:42 PM   This document serves as a record of services personally performed by Gardiner Sleeper, MD, PhD. It was created on their behalf by San Jetty. Owens Shark, OA an ophthalmic technician. The creation of this record is the provider's dictation and/or activities during the visit.    Electronically signed by: San Jetty. Marguerita Merles 05.13.2022 4:42 PM   Gardiner Sleeper, M.D., Ph.D. Diseases & Surgery of the Retina and [place] 06/17/2020  I have reviewed the above documentation for accuracy and completeness, and I agree with the above. Gardiner Sleeper, M.D., Ph.D. 06/17/20 4:42 PM   Abbreviations: M myopia (nearsighted); A astigmatism; H hyperopia (farsighted); P presbyopia; Mrx spectacle prescription;  CTL contact lenses; OD right eye; OS left eye; OU both eyes  XT exotropia; ET esotropia; PEK punctate epithelial keratitis; PEE punctate epithelial erosions; DES dry eye syndrome; MGD meibomian gland dysfunction; ATs artificial tears; PFAT's preservative free artificial tears; [place] nuclear sclerotic cataract; PSC posterior subcapsular cataract; ERM epi-retinal membrane; PVD posterior vitreous detachment; RD retinal detachment; DM diabetes mellitus; DR diabetic retinopathy; NPDR  non-proliferative diabetic retinopathy; PDR proliferative diabetic retinopathy; CSME clinically significant macular edema; DME diabetic macular edema; dbh dot blot hemorrhages; CWS cotton wool spot; POAG primary open angle glaucoma; C/D cup-to-disc ratio; HVF humphrey visual field; GVF goldmann visual field; OCT optical coherence tomography; IOP intraocular pressure; BRVO Branch retinal vein occlusion; CRVO central retinal vein occlusion; CRAO central retinal artery occlusion; BRAO branch retinal artery occlusion; RT retinal tear; SB scleral buckle; PPV pars plana vitrectomy; VH Vitreous hemorrhage; PRP panretinal laser photocoagulation; IVK intravitreal kenalog; VMT vitreomacular traction; MH Macular hole;  NVD neovascularization of the disc; NVE neovascularization elsewhere; AREDS age related eye disease study; ARMD age related macular degeneration; POAG primary open angle glaucoma; EBMD epithelial/anterior basement membrane dystrophy; ACIOL anterior chamber intraocular lens; IOL  intraocular lens; PCIOL posterior chamber intraocular lens; Phaco/IOL phacoemulsification with intraocular lens placement; [place] photorefractive keratectomy; LASIK laser assisted in situ keratomileusis; HTN hypertension; DM diabetes mellitus; COPD chronic obstructive pulmonary disease

## 2020-06-16 ENCOUNTER — Other Ambulatory Visit: Payer: Self-pay | Admitting: Family Medicine

## 2020-06-16 DIAGNOSIS — Z1231 Encounter for screening mammogram for malignant neoplasm of breast: Secondary | ICD-10-CM

## 2020-06-17 ENCOUNTER — Encounter (INDEPENDENT_AMBULATORY_CARE_PROVIDER_SITE_OTHER): Payer: Self-pay | Admitting: Ophthalmology

## 2020-06-17 ENCOUNTER — Ambulatory Visit (INDEPENDENT_AMBULATORY_CARE_PROVIDER_SITE_OTHER): Payer: 59 | Admitting: Ophthalmology

## 2020-06-17 ENCOUNTER — Other Ambulatory Visit: Payer: Self-pay

## 2020-06-17 DIAGNOSIS — E113411 Type 2 diabetes mellitus with severe nonproliferative diabetic retinopathy with macular edema, right eye: Secondary | ICD-10-CM | POA: Diagnosis not present

## 2020-06-17 DIAGNOSIS — H3581 Retinal edema: Secondary | ICD-10-CM | POA: Diagnosis not present

## 2020-06-17 DIAGNOSIS — H25813 Combined forms of age-related cataract, bilateral: Secondary | ICD-10-CM

## 2020-06-17 DIAGNOSIS — I10 Essential (primary) hypertension: Secondary | ICD-10-CM

## 2020-06-17 DIAGNOSIS — E113512 Type 2 diabetes mellitus with proliferative diabetic retinopathy with macular edema, left eye: Secondary | ICD-10-CM

## 2020-06-17 DIAGNOSIS — H35033 Hypertensive retinopathy, bilateral: Secondary | ICD-10-CM

## 2020-06-17 MED ORDER — BEVACIZUMAB CHEMO INJECTION 1.25MG/0.05ML SYRINGE FOR KALEIDOSCOPE
1.2500 mg | INTRAVITREAL | Status: AC | PRN
  Administered 2020-06-17: 1.25 mg via INTRAVITREAL

## 2020-06-21 ENCOUNTER — Other Ambulatory Visit: Payer: Self-pay | Admitting: Pharmacist

## 2020-06-21 DIAGNOSIS — E1165 Type 2 diabetes mellitus with hyperglycemia: Secondary | ICD-10-CM

## 2020-06-21 DIAGNOSIS — E7800 Pure hypercholesterolemia, unspecified: Secondary | ICD-10-CM

## 2020-06-21 DIAGNOSIS — E1169 Type 2 diabetes mellitus with other specified complication: Secondary | ICD-10-CM

## 2020-06-22 ENCOUNTER — Other Ambulatory Visit: Payer: Self-pay

## 2020-06-28 ENCOUNTER — Other Ambulatory Visit: Payer: Self-pay | Admitting: Family Medicine

## 2020-06-28 MED ORDER — LIDOCAINE 2 % EX GEL: 1.0000 "application " | CUTANEOUS | 1 refills | Status: DC | PRN

## 2020-06-28 NOTE — Telephone Encounter (Signed)
Spoke with patient about medication. She stated that on the instructions it says that the medication is for her mouth but it is not. She states that it is for her behind area. And that the doctor that originally Rx that medication put it as mouth for confidential reasons, so she wanted to know if she still needed an appt for that reason? Salvatore Marvel, CMA

## 2020-06-28 NOTE — Telephone Encounter (Signed)
Rx Lidocaine 2% Gel, prn use

## 2020-07-11 NOTE — Progress Notes (Signed)
[facility] Note  07/15/2020     CHIEF COMPLAINT Patient presents for Retina Follow Up   HISTORY OF PRESENT ILLNESS: Alicia West is a 50 y.o. female who presents to the clinic today for:   HPI     Retina Follow Up   Patient presents with  Diabetic Retinopathy.  In right eye.  Severity is moderate.  Duration of 4 weeks.  Since onset it is stable.  I, the attending physician,  performed the HPI with the patient and updated documentation appropriately.        Comments   Patient states sees "dot" in vision OD that comes and goes for the past week. No flashes. BS was 132 this am. Last a1c was 11, checked 7-8 months ago.       Last edited by Bernarda Caffey, MD on 07/15/2020  9:34 AM.    pt states her vision is improving, her blood sugar was 132 this morning  Referring physician: McDiarmid, Blane Ohara, MD [place],  [place] [address]  HISTORICAL INFORMATION:   Selected notes from the [place] Patient referred by Dr. Jola Schmidt for diabetic retinal eval   CURRENT MEDICATIONS: No current outpatient medications on file. (Ophthalmic Drugs)   No current facility-administered medications for this visit. (Ophthalmic Drugs)   Current Outpatient Medications (Other)  Medication Sig   acetaminophen (TYLENOL) 500 MG tablet Take 500 mg by mouth every 6 (six) hours as needed.   atorvastatin (LIPITOR) 40 MG tablet Take 1 tablet (40 mg total) by mouth 2 (two) times a week.   carvedilol (COREG) 25 MG tablet TAKE ONE TABLET BY MOUTH TWICE A DAY WITH MEALS   gabapentin (NEURONTIN) 100 MG capsule TAKE ONE CAPSULE BY MOUTH EVERY NIGHT AT BEDTIME   glucose blood (BAYER CONTOUR NEXT TEST) test strip Use to check blood sugar three times a day or as directed.   Insulin Glargine (BASAGLAR KWIKPEN) 100 UNIT/ML INJECT 34 UNITS UNDER THE SKIN DAILY   Insulin Pen Needle (TECHLITE PEN NEEDLES) 32G X 6 MM MISC Use to inject insulin daily as  instructed   Lidocaine 2 % GEL Apply 1 application topically as needed.   linaclotide (LINZESS) 145 MCG CAPS capsule Take 1 capsule (145 mcg total) by mouth daily.   losartan-hydrochlorothiazide (HYZAAR) 100-25 MG tablet TAKE ONE TABLET BY MOUTH DAILY   metFORMIN (GLUCOPHAGE) 500 MG tablet TAKE ONE TABLET BY MOUTH TWICE A DAY WITH A MEAL   omeprazole (PRILOSEC) 20 MG capsule Take 1 capsule (20 mg total) by mouth daily.   Prenatal Vit-Fe Fum-Fe Bisg-FA (NATACHEW) 28-1 MG CHEW Chew 1 tablet by mouth at bedtime.    Semaglutide, 1 MG/DOSE, 4 MG/3ML SOPN Inject 1 mg into the skin once a week.   spironolactone (ALDACTONE) 25 MG tablet TAKE ONE TABLET BY MOUTH DAILY   Syringe, Disposable, 1 ML MISC 1 each by Does not apply route 3 (three) times daily.   ibuprofen (ADVIL) 400 MG tablet Take by mouth daily as needed. (Patient not taking: No sig reported)   Lancets MISC Use Contour Next Lancet to check blood glucose 3 times a day.  Dx code E11.65. (Patient not taking: No sig reported)   No current facility-administered medications for this visit. (Other)      REVIEW OF SYSTEMS: ROS   Positive for: Gastrointestinal, Genitourinary, Endocrine, Eyes, Respiratory Negative for: Constitutional, Neurological, Skin, Musculoskeletal, HENT, Cardiovascular, Psychiatric, Allergic/Imm, Heme/Lymph Last edited by Jobe Marker,  COT on 07/15/2020  8:34 AM.       ALLERGIES Allergies  Allergen Reactions   Ace Inhibitors Cough   Lipitor [Atorvastatin] Other (See Comments)    Muscle aches in thighs   Norvasc [Amlodipine] Swelling    edema   Penicillins Itching and Rash    Able to tolerate cephalosporins   Metformin And Related     Malaise. Pt states this occurs when taking a high dosage.    PAST MEDICAL HISTORY Past Medical History:  Diagnosis Date   Allergy    seasonal sinus allergies   ANEMIA, IRON DEFICIENCY, UNSPEC. 04/04/2006   Qualifier: Diagnosis of  By: Samara Snide     Bilateral senile  cataracts 11/20/2018   Mixed OU   Binge-eating disorder, moderate 03/17/2013   Breast lump 07/22/2009   Qualifier: Diagnosis of  By: Netty Starring  MD, Lucianne Muss     CKD stage 3 secondary to diabetes ([place]) 10/17/2018   DEPRESSIVE DISORDER, NOS 04/04/2006   Qualifier: Diagnosis of  By: Samara Snide     Diabetes mellitus    on meds   Diabetic macular edema ([place])    by patient report 10/27/2019   Diabetic neuropathy ([place]) 10/17/2018   Diabetic peripheral neuropathy ([place])    per patient   Diabetic visual loss: severe vision impairment of both eyes, with macular edema, with severe nonproliferative retinopathy, associated with type 2 diabetes mellitus ([place]) 98/92/1194       Folliculitis 1/74/0814   Heavy menses    Pt reports she was to get a hysterectomy   Herpes labialis 09/06/2010   Herpes simplex labialis    Hypercalcemia 08/13/2019   Hyperlipidemia    diet controlled/not taking meds for this   Hypertension    on meds   Hypertensive retinopathy    OU   Menorrhagia 04/04/2006   Qualifier: Diagnosis of  By: Samara Snide     Morbid obesity ([place]) 05/01/2013   Neuromuscular disorder ([place])    Obstructive sleep apnea 11/27/2019   Osteoporosis    bilateral hips R>L   Paroxysmal supraventricular tachycardia ([place]) 10/26/2014   PONV (postoperative nausea and vomiting)    POSITIVE PPD 10/18/2009   Qualifier: Diagnosis of  By: Zebedee Iba NP, Manuela Schwartz     Prurigo nodularis 10/17/2018   RHINITIS, ALLERGIC 04/04/2006   Qualifier: Diagnosis of  By: Samara Snide     Sleep apnea    TB (tuberculosis) contact    + skin test,/- chest x-ray - in mid 2000's   Thrombocytosis 08/18/2019   Tuberculosis    postitive 2011-[facility]-cleared per pt   Tubular adenoma of colon 2021   2 mm sessile, Rikki Spearing MD ([facility])   Visual impairment of left eye 08/18/2019   Past Surgical History:  Procedure Laterality Date   REDUCTION MAMMAPLASTY     TONSILLECTOMY     TONSILLECTOMY     WISDOM TOOTH EXTRACTION       FAMILY HISTORY Family History  Problem Relation Age of Onset   Hypertension Mother    Diabetes Mellitus II Mother    Congestive Heart Failure Mother        Deceased from complications of CHF   Hypertension Father    Diabetes Mellitus II Father    Colon polyps Father 22   Colon cancer Father 21   Diabetes Mellitus II Brother    Colon polyps Brother 84   Colon polyps Maternal Uncle 62   Colon cancer Maternal Uncle 62   Esophageal cancer Neg Hx  Stomach cancer Neg Hx    Rectal cancer Neg Hx     SOCIAL HISTORY Social History   Tobacco Use   Smoking status: Never   Smokeless tobacco: Never  Vaping Use   Vaping Use: Never used  Substance Use Topics   Alcohol use: Yes    Comment: Occas   Drug use: No         OPHTHALMIC EXAM:  Base Eye Exam     Visual Acuity (Snellen - Linear)       Right Left   Dist [place] 20/60 -2 20/70   Dist ph [place] 20/30 20/40 -2         Tonometry (Tonopen, 8:39 AM)       Right Left   Pressure 19 17         Pupils       Dark Light Shape React APD   Right 3 2 Round Brisk None   Left 3 2 Round Brisk None         Visual Fields     Counting fingers         Extraocular Movement       Right Left    Full, Ortho Full, Ortho         Neuro/Psych     Oriented x3: Yes   Mood/Affect: Normal         Dilation     Both eyes: 1.0% Mydriacyl, 2.5% Phenylephrine @ 8:39 AM           Slit Lamp and Fundus Exam     Slit Lamp Exam       Right Left   Lids/Lashes Dermatochalasis - upper lid Dermatochalasis - upper lid   Conjunctiva/Sclera Mild Melanosis Mild Melanosis   Cornea trace i8nferior PEE trace inferior PEE   Anterior Chamber Deep and quiet Deep and quiet   Iris Round and dilated Round and dilated   Lens 2+ Nuclear sclerosis, 2+ Cortical cataract, trace Posterior subcapsular cataract 2+ Nuclear sclerosis, 2+ Cortical cataract, trace PSC   Vitreous Mild Vitreous syneresis Mild Vitreous syneresis          Fundus Exam       Right Left   Disc Pink and Sharp, +cupping Pink and Sharp, +cupping   C/D Ratio 0.6 0.6   Macula Good foveal reflex, +MA, +exudates / edema greatest temporal macula -- slightly improved Flat, Blunted foveal reflex, +Microaneurysms and exudates, +edema central and temporal macula -- slightly improved   Vessels attenuated, mild tortuousity, mild Copper wiring attenuated, Tortuous, mild Copper wiring, +NV along IN arcades   Periphery Attached, scattered MA Attached, scattered MA, 360 PRP            IMAGING AND PROCEDURES  Imaging and Procedures for @TODAY @  OCT, Retina - OU - Both Eyes       Right Eye Quality was good. Central Foveal Thickness: 232. Progression has improved. Findings include normal foveal contour, intraretinal fluid, intraretinal hyper-reflective material, no SRF, vitreomacular adhesion (Mild Interval improvement in IRF/ IRHM temporal macula).   Left Eye Quality was good. Central Foveal Thickness: 445. Progression has improved. Findings include abnormal foveal contour, intraretinal fluid, intraretinal hyper-reflective material, no SRF, vitreomacular adhesion (Interval improvement IRF / IRHM central and temporal macula).   Notes *Images captured and stored on drive  Diagnosis / Impression:  NFP, no SRF OU OD: Interval improvement in IRF/ IRHM temporal macula OS: Interval improvement IRF / IRHM temporal fovea and macula  Clinical management:  See below  Abbreviations: NFP - Normal foveal profile. CME - cystoid macular edema. PED - pigment epithelial detachment. IRF - intraretinal fluid. SRF - subretinal fluid. EZ - ellipsoid zone. ERM - epiretinal membrane. ORA - outer retinal atrophy. ORT - outer retinal tubulation. SRHM - subretinal hyper-reflective material      Intravitreal Injection, Pharmacologic Agent - OD - Right Eye       Time Out 07/15/2020. 9:00 AM. Confirmed correct patient, procedure, site, and patient consented.    Anesthesia Topical anesthesia was used. Anesthetic medications included Lidocaine 2%, Proparacaine 0.5%.   Procedure Preparation included eyelid speculum, 5% betadine to ocular surface. A supplied needle was used.   Injection: 1.25 mg Bevacizumab 1.35m/0.05ml   Route: Intravitreal, Site: Right Eye   NDC:: 27253-664-40 Lot: [id number] , Expiration date: 08/17/2020, Waste: 0 mL   Post-op Post injection exam found visual acuity of at least counting fingers. The patient tolerated the procedure well. There were no complications. The patient received written and verbal post procedure care education. Post injection medications were not given.      Intravitreal Injection, Pharmacologic Agent - OS - Left Eye       Time Out 07/15/2020. 9:01 AM. Confirmed correct patient, procedure, site, and patient consented.   Anesthesia Topical anesthesia was used. Anesthetic medications included Lidocaine 2%, Proparacaine 0.5%.   Procedure Preparation included 5% betadine to ocular surface, eyelid speculum. A (32g) needle was used.   Injection: 1.25 mg Bevacizumab 1.225m0.05ml   Route: Intravitreal, Site: Left Eye   NDC: 5030mLot: [id number] date: 08/17/2020, Waste: 0.05 mL   Post-op Post injection exam found visual acuity of at least counting fingers. The patient tolerated the procedure well. There were no complications. The patient received written and verbal post procedure care education. Post injection medications were not given.            ASSESSMENT/PLAN:    ICD-10-CM   1. Severe nonproliferative diabetic retinopathy of right eye with macular edema associated with type 2 diabetes mellitus (HCC)  E17/26/2022ntravitreal Injection, Pharmacologic Agent - OD - Right Eye    Bevacizumab (AVASTIN) SOLN 1.25 mg    2. Proliferative diabetic retinopathy of left eye with macular edema associated with type 2 diabetes mellitus (HCC)  E1F75.1025ntravitreal Injection, Pharmacologic  Agent - OS - Left Eye    Bevacizumab (AVASTIN) SOLN 1.25 mg    3. Retinal edema  H35.81 OCT, Retina - OU - Both Eyes    4. Essential hypertension  I10     5. Hypertensive retinopathy of both eyes  H35.033     6. Combined forms of age-related cataract of both eyes  H25.813       1-3. Severe nonproliferative diabetic retinopathy, OD  - OS -- interval conversion to PDR -- noted 3.18.22  - lost to f/u here since 01/2019 -- s/p IVA OS #1 (12.15.20), #2 (03.18.22)  [facility] [place]but has not been seen since August of 2018 -- s/p multiple IVA OU  - s/p IVA OS #1 (12.16.20), #2 (03.18.22), #3 (04.15.22), #4 (05.13.22)  - s/p IVA OD #1 (04.15.22), #2 (05.13.22)  - s/p PRP OS (04.01.22)  - exam shows scattered IRH and exudates OU  - OCT shows OD: Interval improvement in IRF/ IRHM temporal macula; OS: Interval increase in central cystic changes, persistent IRF / IRHM  - BCVA 20/30 OD, 20/50 OS (decreased OS)  - FA 12.15.20 shows scattered leaking MA OU, vascular perfusion defects OU,  no NV OU  - repeat FA (03.18.22) shows new retinal NV OS -- s/p PRP OS 04.01.22  - recommend IVA OU (OD #3 and OS #5) today, 06.10.22 for DME  - pt wishes to proceed with injection  - RBA of procedure discussed, questions answered  - informed consent obtained and signed  - see procedure note  - will need PRP OD for patches of capillary nonperfusion  - f/u 2-3 weeks, DFE, OCT, PRP OD  4,5. Hypertensive retinopathy OU  - discussed importance of tight BP control  - monitor   6. Mixed form age related cataract OU -- mild  - under the expert management of Dr. Valetta Close  - The symptoms of cataract, surgical options, and treatments and risks were discussed with patient.   - discussed diagnosis and progression and association of PSC with DM   Ophthalmic Meds Ordered this visit:  Meds ordered this encounter  Medications   Bevacizumab (AVASTIN)  SOLN 1.25 mg   Bevacizumab (AVASTIN) SOLN 1.25 mg        Return for f/u 2-3 weeks, NPDR OU, DFE, OCT, PRP OD.  There are no Patient Instructions on file for this visit.    This document serves as a record of services personally performed by Gardiner Sleeper, MD, PhD. It was created on their behalf by San Jetty. Owens Shark, OA an ophthalmic technician. The creation of this record is the provider's dictation and/or activities during the visit.    Electronically signed by: San Jetty. Owens Shark, [place] 06.06.2022 2:25 AM   Gardiner Sleeper, M.D., Ph.D. Diseases & Surgery of the Retina and Vitreous [facility]  I have reviewed the above documentation for accuracy and completeness, and I agree with the above. Gardiner Sleeper, M.D., Ph.D. 07/17/20 2:25 AM  Abbreviations: M myopia (nearsighted); A astigmatism; H hyperopia (farsighted); P presbyopia; Mrx spectacle prescription;  CTL contact lenses; OD right eye; OS left eye; OU both eyes  XT exotropia; ET esotropia; PEK punctate epithelial keratitis; PEE punctate epithelial erosions; DES dry eye syndrome; MGD meibomian gland dysfunction; ATs artificial tears; PFAT's preservative free artificial tears; [place] nuclear sclerotic cataract; PSC posterior subcapsular cataract; ERM epi-retinal membrane; PVD posterior vitreous detachment; RD retinal detachment; DM diabetes mellitus; DR diabetic retinopathy; NPDR non-proliferative diabetic retinopathy; PDR proliferative diabetic retinopathy; CSME clinically significant macular edema; DME diabetic macular edema; dbh dot blot hemorrhages; CWS cotton wool spot; POAG primary open angle glaucoma; C/D cup-to-disc ratio; HVF humphrey visual field; GVF goldmann visual field; OCT optical coherence tomography; IOP intraocular pressure; BRVO Branch retinal vein occlusion; CRVO central retinal vein occlusion; CRAO central retinal artery occlusion; BRAO branch retinal artery occlusion; RT retinal tear; SB scleral  buckle; PPV pars plana vitrectomy; VH Vitreous hemorrhage; PRP panretinal laser photocoagulation; IVK intravitreal kenalog; VMT vitreomacular traction; MH Macular hole;  NVD neovascularization of the disc; NVE neovascularization elsewhere; AREDS age related eye disease study; ARMD age related macular degeneration; POAG primary open angle glaucoma; EBMD epithelial/anterior basement membrane dystrophy; ACIOL anterior chamber intraocular lens; IOL intraocular lens; PCIOL posterior chamber intraocular lens; Phaco/IOL phacoemulsification with intraocular lens placement; [place] photorefractive keratectomy; LASIK laser assisted in situ keratomileusis; HTN hypertension; DM diabetes mellitus; COPD chronic obstructive pulmonary disease

## 2020-07-15 ENCOUNTER — Other Ambulatory Visit: Payer: Self-pay

## 2020-07-15 ENCOUNTER — Encounter (INDEPENDENT_AMBULATORY_CARE_PROVIDER_SITE_OTHER): Payer: Self-pay | Admitting: Ophthalmology

## 2020-07-15 ENCOUNTER — Ambulatory Visit (INDEPENDENT_AMBULATORY_CARE_PROVIDER_SITE_OTHER): Payer: 59 | Admitting: Ophthalmology

## 2020-07-15 DIAGNOSIS — E113512 Type 2 diabetes mellitus with proliferative diabetic retinopathy with macular edema, left eye: Secondary | ICD-10-CM

## 2020-07-15 DIAGNOSIS — H3581 Retinal edema: Secondary | ICD-10-CM | POA: Diagnosis not present

## 2020-07-15 DIAGNOSIS — I10 Essential (primary) hypertension: Secondary | ICD-10-CM | POA: Diagnosis not present

## 2020-07-15 DIAGNOSIS — H25813 Combined forms of age-related cataract, bilateral: Secondary | ICD-10-CM

## 2020-07-15 DIAGNOSIS — E113411 Type 2 diabetes mellitus with severe nonproliferative diabetic retinopathy with macular edema, right eye: Secondary | ICD-10-CM | POA: Diagnosis not present

## 2020-07-15 DIAGNOSIS — H35033 Hypertensive retinopathy, bilateral: Secondary | ICD-10-CM

## 2020-07-15 MED ORDER — BEVACIZUMAB CHEMO INJECTION 1.25MG/0.05ML SYRINGE FOR KALEIDOSCOPE
1.2500 mg | INTRAVITREAL | Status: AC | PRN
  Administered 2020-07-15: 1.25 mg via INTRAVITREAL

## 2020-07-26 NOTE — Progress Notes (Signed)
[facility] Note  07/27/2020     CHIEF COMPLAINT Patient presents for Retina Follow Up   HISTORY OF PRESENT ILLNESS: Alicia West is a 50 y.o. female who presents to the clinic today for:   HPI     Retina Follow Up   Patient presents with  Diabetic Retinopathy.  In both eyes.  This started 2 weeks ago.  I, the attending physician,  performed the HPI with the patient and updated documentation appropriately.        Comments   Patient here for 2 weeks retina follow up for NPDR OU PRP OD. Patient states vision cant see for a couple days. Hard to see far away. Hasn't been feeling good. No eye pain. Has been rubbing eye pretty hard. OD sees a lot of floaters. More than normal.       Last edited by Bernarda Caffey, MD on 07/27/2020  6:28 PM.     pt is here for PRP OD today  Referring physician: McDiarmid, Blane Ohara, MD [place],  [place] [address]  HISTORICAL INFORMATION:   Selected notes from the [facility] Patient referred by Dr. Jola Schmidt for diabetic retinal eval   CURRENT MEDICATIONS: No current outpatient medications on file. (Ophthalmic Drugs)   No current facility-administered medications for this visit. (Ophthalmic Drugs)   Current Outpatient Medications (Other)  Medication Sig   acetaminophen (TYLENOL) 500 MG tablet Take 500 mg by mouth every 6 (six) hours as needed.   atorvastatin (LIPITOR) 40 MG tablet Take 1 tablet (40 mg total) by mouth 2 (two) times a week.   carvedilol (COREG) 25 MG tablet TAKE ONE TABLET BY MOUTH TWICE A DAY WITH MEALS   gabapentin (NEURONTIN) 100 MG capsule TAKE ONE CAPSULE BY MOUTH EVERY NIGHT AT BEDTIME   glucose blood (BAYER CONTOUR NEXT TEST) test strip Use to check blood sugar three times a day or as directed.   ibuprofen (ADVIL) 400 MG tablet Take by mouth daily as needed. (Patient not taking: No sig reported)   Insulin Glargine (BASAGLAR KWIKPEN) 100 UNIT/ML INJECT 34  UNITS UNDER THE SKIN DAILY   Insulin Pen Needle (TECHLITE PEN NEEDLES) 32G X 6 MM MISC Use to inject insulin daily as instructed   Lancets MISC Use Contour Next Lancet to check blood glucose 3 times a day.  Dx code E11.65. (Patient not taking: No sig reported)   Lidocaine 2 % GEL Apply 1 application topically as needed.   linaclotide (LINZESS) 145 MCG CAPS capsule Take 1 capsule (145 mcg total) by mouth daily.   losartan-hydrochlorothiazide (HYZAAR) 100-25 MG tablet TAKE ONE TABLET BY MOUTH DAILY   metFORMIN (GLUCOPHAGE) 500 MG tablet TAKE ONE TABLET BY MOUTH TWICE A DAY WITH A MEAL   omeprazole (PRILOSEC) 20 MG capsule Take 1 capsule (20 mg total) by mouth daily.   Prenatal Vit-Fe Fum-Fe Bisg-FA (NATACHEW) 28-1 MG CHEW Chew 1 tablet by mouth at bedtime.    Semaglutide, 1 MG/DOSE, 4 MG/3ML SOPN Inject 1 mg into the skin once a week.   spironolactone (ALDACTONE) 25 MG tablet TAKE ONE TABLET BY MOUTH DAILY   Syringe, Disposable, 1 ML MISC 1 each by Does not apply route 3 (three) times daily.   No current facility-administered medications for this visit. (Other)      REVIEW OF SYSTEMS: ROS   Positive for: Gastrointestinal, Genitourinary, Endocrine, Eyes, Respiratory Negative for: Constitutional, Neurological, Skin, Musculoskeletal, HENT, Cardiovascular, Psychiatric, Allergic/Imm, Heme/Lymph Last  edited by Alicia West, COA on 07/27/2020  3:12 PM.        ALLERGIES Allergies  Allergen Reactions   Ace Inhibitors Cough   Lipitor [Atorvastatin] Other (See Comments)    Muscle aches in thighs   Norvasc [Amlodipine] Swelling    edema   Penicillins Itching and Rash    Able to tolerate cephalosporins   Metformin And Related     Malaise. Pt states this occurs when taking a high dosage.    PAST MEDICAL HISTORY Past Medical History:  Diagnosis Date   Allergy    seasonal sinus allergies   ANEMIA, IRON DEFICIENCY, UNSPEC. 04/04/2006   Qualifier: Diagnosis of  By: Samara Snide      Bilateral senile cataracts 11/20/2018   Mixed OU   Binge-eating disorder, moderate 03/17/2013   Breast lump 07/22/2009   Qualifier: Diagnosis of  By: Netty Starring  MD, Lucianne Muss     CKD stage 3 secondary to diabetes ([place]) 10/17/2018   DEPRESSIVE DISORDER, NOS 04/04/2006   Qualifier: Diagnosis of  By: Samara Snide     Diabetes mellitus    on meds   Diabetic macular edema ([place])    by patient report 10/27/2019   Diabetic neuropathy ([place]) 10/17/2018   Diabetic peripheral neuropathy ([place])    per patient   Diabetic visual loss: severe vision impairment of both eyes, with macular edema, with severe nonproliferative retinopathy, associated with type 2 diabetes mellitus ([place]) 57/84/6962       Folliculitis 9/52/8413   Heavy menses    Pt reports she was to get a hysterectomy   Herpes labialis 09/06/2010   Herpes simplex labialis    Hypercalcemia 08/13/2019   Hyperlipidemia    diet controlled/not taking meds for this   Hypertension    on meds   Hypertensive retinopathy    OU   Menorrhagia 04/04/2006   Qualifier: Diagnosis of  By: Samara Snide     Morbid obesity ([place]) 05/01/2013   Neuromuscular disorder ([place])    Obstructive sleep apnea 11/27/2019   Osteoporosis    bilateral hips R>L   Paroxysmal supraventricular tachycardia ([place]) 10/26/2014   PONV (postoperative nausea and vomiting)    POSITIVE PPD 10/18/2009   Qualifier: Diagnosis of  By: Zebedee Iba NP, Manuela Schwartz     Prurigo nodularis 10/17/2018   RHINITIS, ALLERGIC 04/04/2006   Qualifier: Diagnosis of  By: Samara Snide     Sleep apnea    TB (tuberculosis) contact    + skin test,/- chest x-ray - in mid 2000's   Thrombocytosis 08/18/2019   Tuberculosis    postitive 2011-[facility]ared per pt   Tubular adenoma of colon 2021   2 mm sessile, Rikki Spearing MD ([facility])   Visual impairment of left eye 08/18/2019   Past Surgical History:  Procedure Laterality Date   REDUCTION MAMMAPLASTY     TONSILLECTOMY     TONSILLECTOMY     WISDOM TOOTH  EXTRACTION      FAMILY HISTORY Family History  Problem Relation Age of Onset   Hypertension Mother    Diabetes Mellitus II Mother    Congestive Heart Failure Mother        Deceased from complications of CHF   Hypertension Father    Diabetes Mellitus II Father    Colon polyps Father 55   Colon cancer Father 57   Diabetes Mellitus II Brother    Colon polyps Brother 77   Colon polyps Maternal Uncle 62   Colon cancer Maternal Uncle 62  Esophageal cancer Neg Hx    Stomach cancer Neg Hx    Rectal cancer Neg Hx     SOCIAL HISTORY Social History   Tobacco Use   Smoking status: Never   Smokeless tobacco: Never  Vaping Use   Vaping Use: Never used  Substance Use Topics   Alcohol use: Yes    Comment: Occas   Drug use: No         OPHTHALMIC EXAM:  Base Eye Exam     Visual Acuity (Snellen - Linear)       Right Left   Dist [employer] 20/70 -2 20/70 -2   Dist ph [place] 20/40 -1 20/50         Tonometry (Tonopen, 3:08 PM)       Right Left   Pressure 17 16         Pupils       Dark Light Shape React APD   Right 3 2 Round Brisk None   Left 3 2 Round Brisk None         Visual Fields (Counting fingers)       Left Right    Full Full         Extraocular Movement       Right Left    Full, Ortho Full, Ortho         Neuro/Psych     Oriented x3: Yes   Mood/Affect: Normal         Dilation     Both eyes: 1.0% Mydriacyl, 2.5% Phenylephrine @ 3:08 PM           Slit Lamp and Fundus Exam     Slit Lamp Exam       Right Left   Lids/Lashes Dermatochalasis - upper lid Dermatochalasis - upper lid   Conjunctiva/Sclera Mild Melanosis Mild Melanosis   Cornea trace i8nferior PEE trace inferior PEE   Anterior Chamber Deep and quiet Deep and quiet   Iris Round and dilated Round and dilated   Lens 2+ Nuclear sclerosis, 2+ Cortical cataract, trace Posterior subcapsular cataract 2+ Nuclear sclerosis, 2+ Cortical cataract, trace PSC   Vitreous Mild Vitreous  syneresis Mild Vitreous syneresis         Fundus Exam       Right Left   Disc Pink and Sharp, +cupping Pink and Sharp, +cupping   C/D Ratio 0.6 0.6   Macula Good foveal reflex, +MA, +exudates / edema greatest temporal macula -- slightly improved Flat, Blunted foveal reflex, +Microaneurysms and exudates, +edema central and temporal macula -- slightly improved   Vessels attenuated, mild tortuousity, mild Copper wiring attenuated, Tortuous, mild Copper wiring, +NV along IN arcades   Periphery Attached, scattered MA Attached, scattered MA, 360 PRP            IMAGING AND PROCEDURES  Imaging and Procedures for @TODAY @  OCT, Retina - OU - Both Eyes       Right Eye Quality was good. Central Foveal Thickness: 228. Progression has improved. Findings include normal foveal contour, intraretinal fluid, intraretinal hyper-reflective material, no SRF, vitreomacular adhesion (Mild Interval improvement in IRF/ IRHM temporal macula).   Left Eye Quality was good. Central Foveal Thickness: 410. Progression has improved. Findings include abnormal foveal contour, intraretinal fluid, intraretinal hyper-reflective material, no SRF, vitreomacular adhesion (Interval improvement IRF / IRHM central and temporal macula).   Notes *Images captured and stored on drive  Diagnosis / Impression:  NFP, no SRF OU OD: Interval improvement in IRF/ IRHM temporal  macula OS: Interval improvement IRF / IRHM temporal fovea and macula  Clinical management:  See below  Abbreviations: NFP - Normal foveal profile. CME - cystoid macular edema. PED - pigment epithelial detachment. IRF - intraretinal fluid. SRF - subretinal fluid. EZ - ellipsoid zone. ERM - epiretinal membrane. ORA - outer retinal atrophy. ORT - outer retinal tubulation. SRHM - subretinal hyper-reflective material      Panretinal Photocoagulation - OD - Right Eye       Time Out Confirmed correct patient, procedure, site, and patient consented.    Anesthesia Topical anesthesia was used. Anesthetic medications included Proparacaine 0.5%.   Post-op The patient tolerated the procedure well. There were no complications. The patient received written and verbal post procedure care education.   Notes LASER PROCEDURE NOTE  Diagnosis:   Severe Non-Proliferative Diabetic Retinopathy w/ peripheral nonperfusion, RIGHT EYE  Procedure:  Pan-retinal photocoagulation using slit lamp laser, RIGHT EYE  Anesthesia:  Topical  Surgeon: Bernarda Caffey, MD, PhD   Informed consent obtained, operative eye marked, and time out performed prior to initiation of laser.   Lumenis [id number] slit lamp laser Pattern: 3x3 square Power: 260 mW Duration: 30 msec  Spot size: 200 microns  # spots: [id number] spots  Complications: None.  RTC: July 8 or later -- DFE/OCT, possible injection(s)  Patient tolerated the procedure well and received written and verbal post-procedure care information/education.             ASSESSMENT/PLAN:    ICD-10-CM   1. Severe nonproliferative diabetic retinopathy of right eye with macular edema associated with type 2 diabetes mellitus ([place])  S01.0932 Panretinal Photocoagulation - OD - Right Eye    2. Proliferative diabetic retinopathy of left eye with macular edema associated with type 2 diabetes mellitus ([place])  T55.7322     3. Retinal edema  H35.81 OCT, Retina - OU - Both Eyes    4. Essential hypertension  I10     5. Hypertensive retinopathy of both eyes  H35.033     6. Combined forms of age-related cataract of both eyes  H25.813       1-3. Severe nonproliferative diabetic retinopathy, OD  - OS -- interval conversion to PDR -- noted 3.18.22  - lost to f/u here since 01/2019 -- s/p IVA OS #1 (12.15.20), #2 (03.18.22)  - previously followed at [facility] with Drs. Oval Linsey and [place], but has not been seen since August of 2018 -- s/p multiple IVA OU  - s/p IVA OS #1 (12.16.20), #2 (03.18.22), #3  (04.15.22), #4 (05.13.22), #5 (06.10.22)  - s/p IVA OD #1 (04.15.22), #2 (05.13.22), #3 (06.10.22)  - s/p PRP OS (04.01.22)  - exam shows scattered IRH and exudates OU  - OCT shows OD: Interval improvement in IRF/ IRHM temporal macula; OS: Interval increase in central cystic changes, persistent IRF / IRHM  - BCVA 20/40 OD, 20/50 OS (decreased OU)  - FA 12.15.20 shows scattered leaking MA OU, vascular perfusion defects OU, no NV OU  - repeat FA (03.18.22) shows new retinal NV OS -- s/p PRP OS 04.01.22  - recommend PRP OD today, 06.22.22 for peripheral nonperfusion  - pt wishes to proceed with laser  - RBA of procedure discussed, questions answered  - informed consent obtained and signed  - see procedure note  - start PF QID OD x7 days  - f/u July 8 or later, DFE, OCT, possible injections  4,5. Hypertensive retinopathy OU  - discussed importance of tight BP control  -  monitor  6. Mixed form age related cataract OU -- mild  - under the expert management of Dr. Valetta Close  - The symptoms of cataract, surgical options, and treatments and risks were discussed with patient.   - discussed diagnosis and progression and association of PSC with DM   Ophthalmic Meds Ordered this visit:  No orders of the defined types were placed in this encounter.       Return for July 8 or later - DME OU - Dilated Exam, OCT, Possible Injxn.  There are no Patient Instructions on file for this visit.    This document serves as a record of services personally performed by Alicia Sleeper, MD, PhD. It was created on their behalf by Alicia West, COMT. The creation of this record is the provider's dictation and/or activities during the visit.  Electronically signed by: Alicia West, COMT 07/27/20 6:33 PM  This document serves as a record of services personally performed by Alicia Sleeper, MD, PhD. It was created on their behalf by Alicia West. Owens Shark, OA an ophthalmic technician. The creation of this record is the  provider's dictation and/or activities during the visit.    Electronically signed by: Alicia West. Marguerita Merles 06.22.2022 6:33 PM  Alicia West, M.D., Ph.D. Diseases & Surgery of the Retina and Vitreous [facility]  I have reviewed the above documentation for accuracy and completeness, and I agree with the above. Alicia West, M.D., Ph.D. 07/27/20 6:33 PM   Abbreviations: M myopia (nearsighted); A astigmatism; H hyperopia (farsighted); P presbyopia; Mrx spectacle prescription;  CTL contact lenses; OD right eye; OS left eye; OU both eyes  XT exotropia; ET esotropia; PEK punctate epithelial keratitis; PEE punctate epithelial erosions; DES dry eye syndrome; MGD meibomian gland dysfunction; ATs artificial tears; PFAT's preservative free artificial tears; [place] nuclear sclerotic cataract; PSC posterior subcapsular cataract; ERM epi-retinal membrane; PVD posterior vitreous detachment; RD retinal detachment; DM diabetes mellitus; DR diabetic retinopathy; NPDR non-proliferative diabetic retinopathy; PDR proliferative diabetic retinopathy; CSME clinically significant macular edema; DME diabetic macular edema; dbh dot blot hemorrhages; CWS cotton wool spot; POAG primary open angle glaucoma; C/D cup-to-disc ratio; HVF humphrey visual field; GVF goldmann visual field; OCT optical coherence tomography; IOP intraocular pressure; BRVO Branch retinal vein occlusion; CRVO central retinal vein occlusion; CRAO central retinal artery occlusion; BRAO branch retinal artery occlusion; RT retinal tear; SB scleral buckle; PPV pars plana vitrectomy; VH Vitreous hemorrhage; PRP panretinal laser photocoagulation; IVK intravitreal kenalog; VMT vitreomacular traction; MH Macular hole;  NVD neovascularization of the disc; NVE neovascularization elsewhere; AREDS age related eye disease study; ARMD age related macular degeneration; POAG primary open angle glaucoma; EBMD epithelial/anterior basement membrane  dystrophy; ACIOL anterior chamber intraocular lens; IOL intraocular lens; PCIOL posterior chamber intraocular lens; Phaco/IOL phacoemulsification with intraocular lens placement; [place] photorefractive keratectomy; LASIK laser assisted in situ keratomileusis; HTN hypertension; DM diabetes mellitus; COPD chronic obstructive pulmonary disease

## 2020-07-27 ENCOUNTER — Other Ambulatory Visit: Payer: Self-pay

## 2020-07-27 ENCOUNTER — Ambulatory Visit (INDEPENDENT_AMBULATORY_CARE_PROVIDER_SITE_OTHER): Payer: 59 | Admitting: Ophthalmology

## 2020-07-27 ENCOUNTER — Encounter (INDEPENDENT_AMBULATORY_CARE_PROVIDER_SITE_OTHER): Payer: Self-pay | Admitting: Ophthalmology

## 2020-07-27 DIAGNOSIS — I10 Essential (primary) hypertension: Secondary | ICD-10-CM

## 2020-07-27 DIAGNOSIS — H3581 Retinal edema: Secondary | ICD-10-CM | POA: Diagnosis not present

## 2020-07-27 DIAGNOSIS — E113512 Type 2 diabetes mellitus with proliferative diabetic retinopathy with macular edema, left eye: Secondary | ICD-10-CM | POA: Diagnosis not present

## 2020-07-27 DIAGNOSIS — E113411 Type 2 diabetes mellitus with severe nonproliferative diabetic retinopathy with macular edema, right eye: Secondary | ICD-10-CM | POA: Diagnosis not present

## 2020-07-27 DIAGNOSIS — H35033 Hypertensive retinopathy, bilateral: Secondary | ICD-10-CM

## 2020-07-27 DIAGNOSIS — H25813 Combined forms of age-related cataract, bilateral: Secondary | ICD-10-CM

## 2020-08-10 ENCOUNTER — Other Ambulatory Visit: Payer: Self-pay

## 2020-08-10 ENCOUNTER — Ambulatory Visit
Admission: RE | Admit: 2020-08-10 | Discharge: 2020-08-10 | Disposition: A | Discharge status: Home / Self Care | Payer: 59 | Source: Ambulatory Visit

## 2020-08-10 DIAGNOSIS — Z1231 Encounter for screening mammogram for malignant neoplasm of breast: Secondary | ICD-10-CM

## 2020-08-10 IMAGING — MG MM DIGITAL SCREENING BILAT W/ TOMO AND CAD
6 of 12 series · 6 of 36 positions shown · non-contrast
Comparison: Previous exam(s).

ACR Breast Density Category a: The breast tissue is almost entirely
fatty.

CLINICAL DATA: Screening.

EXAM:
DIGITAL SCREENING BILATERAL MAMMOGRAM WITH TOMOSYNTHESIS AND CAD
TECHNIQUE: Bilateral screening digital craniocaudal and mediolateral oblique
mammograms were obtained. Bilateral screening digital breast
tomosynthesis was performed. The images were evaluated with
computer-aided detection. Limited assessment of the posterior
tissues on CC view secondary to post reduction changes.

[R MLO synth-2D (1 of 2)]
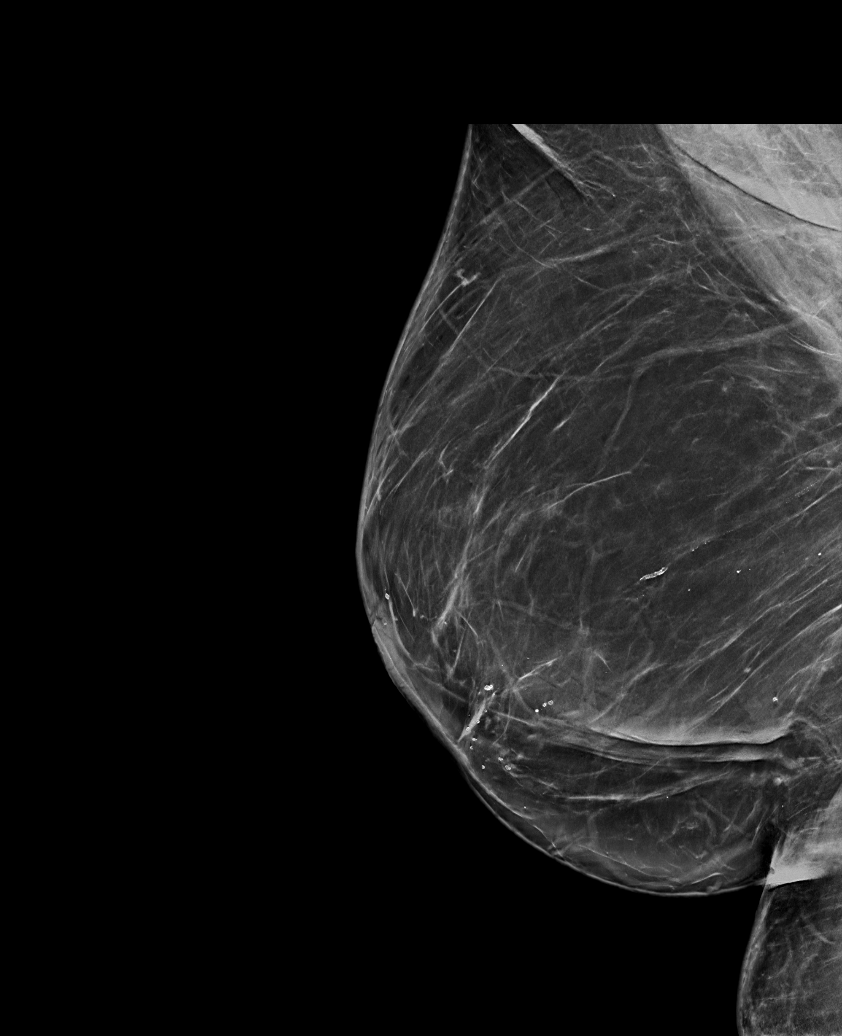

[R MLO synth-2D (2 of 2)]
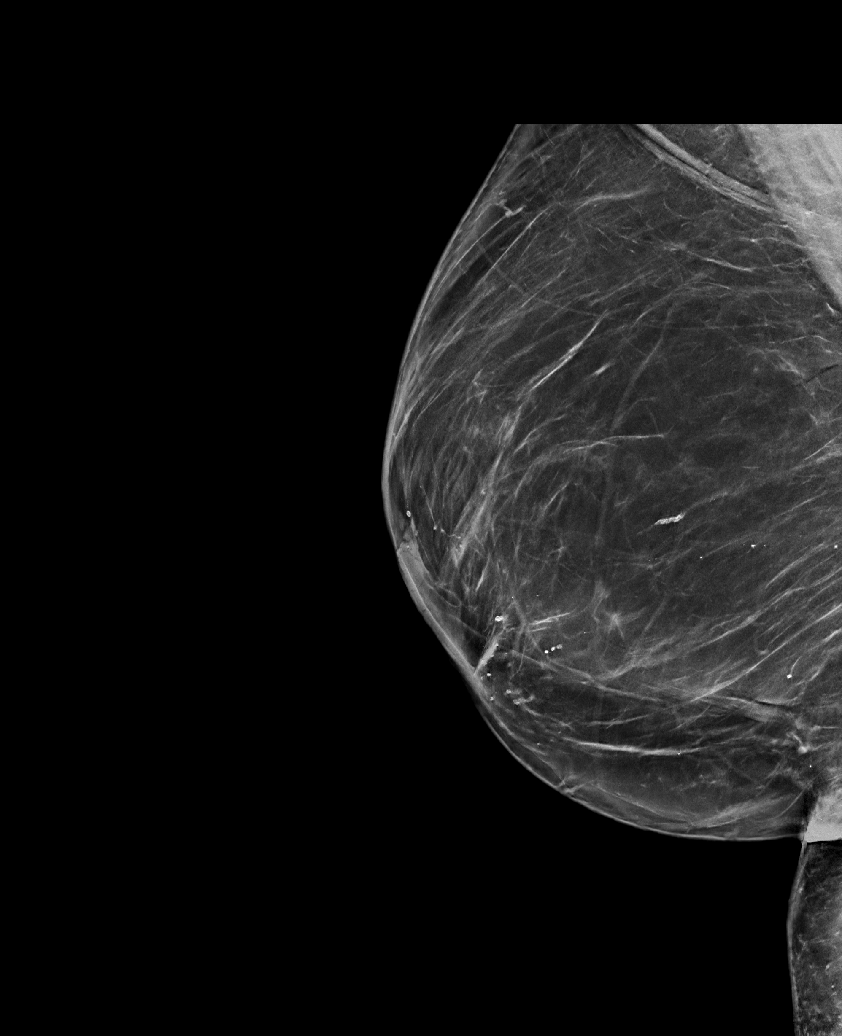

[L CC synth-2D]
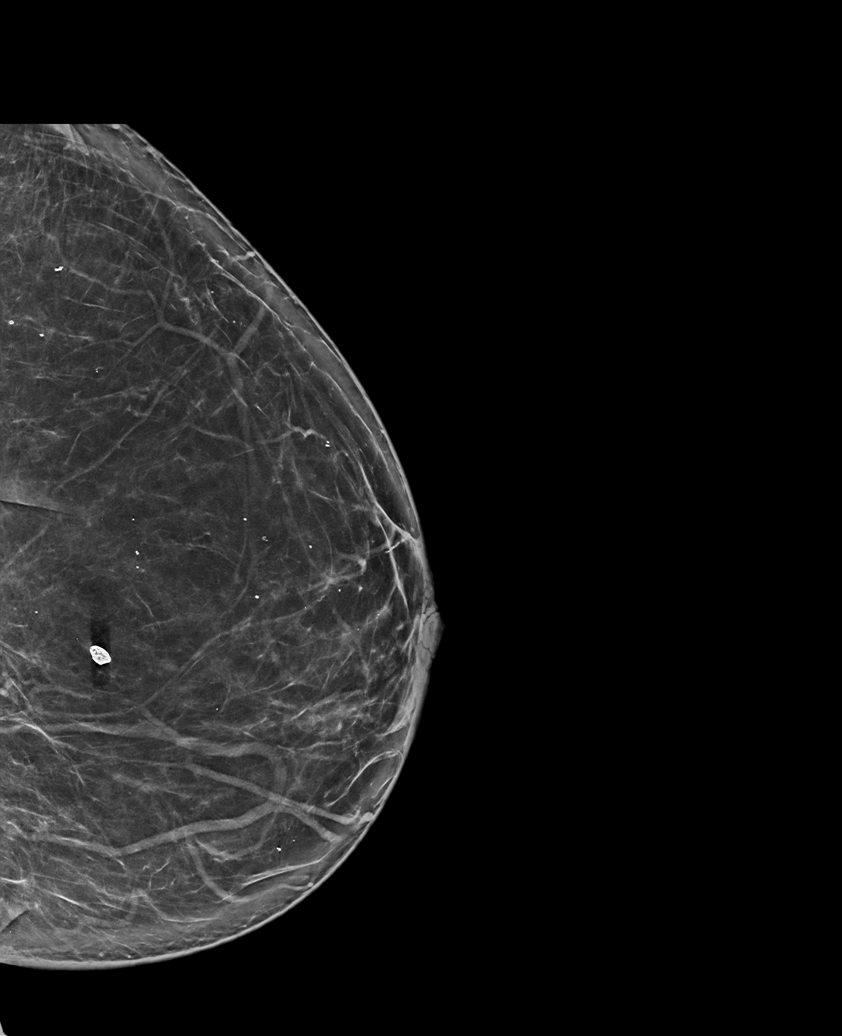

[L MLO synth-2D (1 of 2)]
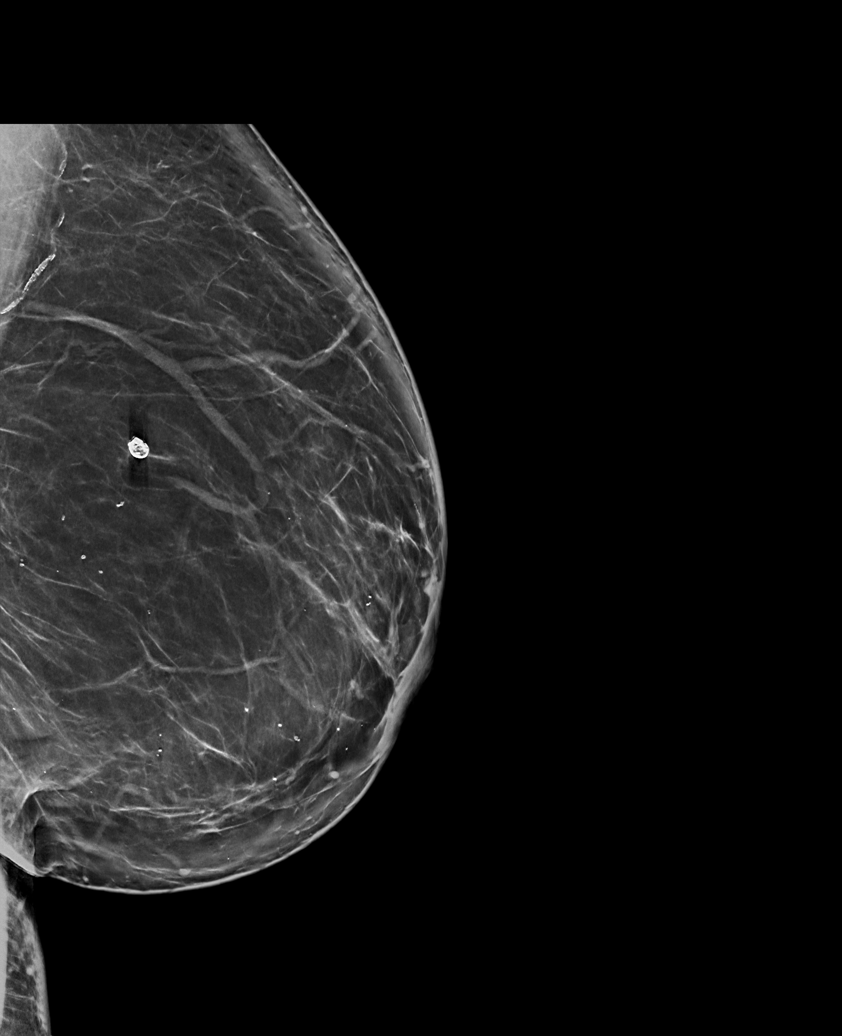

[R CC synth-2D]
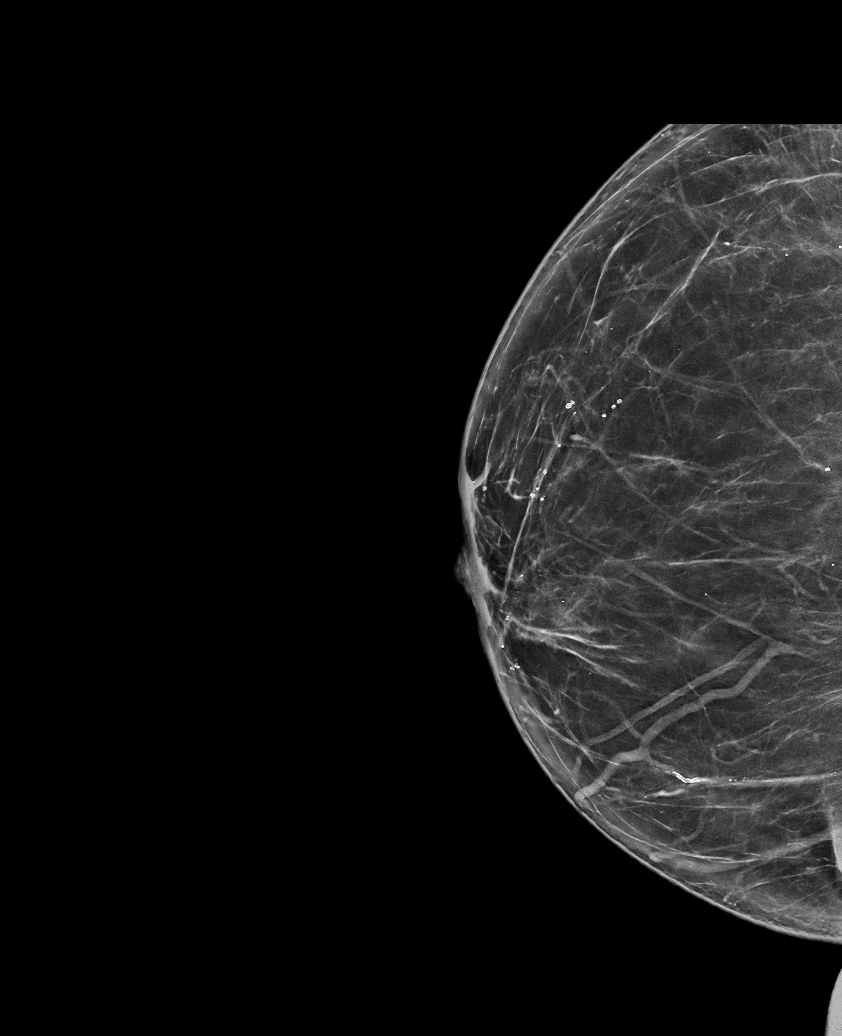

[L MLO synth-2D (2 of 2)]
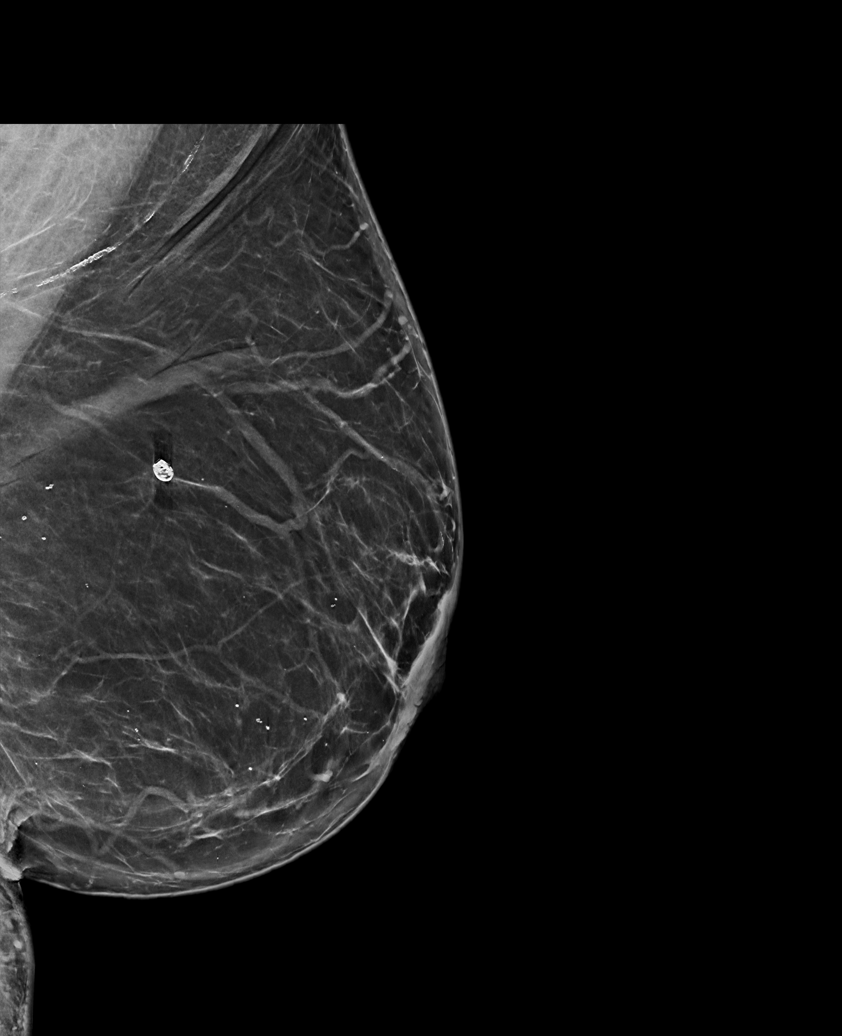

[6 of 36 positions shown; findings below may reference images not displayed]

FINDINGS: There are no findings suspicious for malignancy. Stable post
reduction changes.
IMPRESSION: No mammographic evidence of malignancy. A result letter of this
screening mammogram will be mailed directly to the patient.

RECOMMENDATION:
Screening mammogram in one year. (Code:[ID])

BI-RADS CATEGORY  2: Benign.

## 2020-08-16 NOTE — Progress Notes (Signed)
[facility] Note  08/17/2020     CHIEF COMPLAINT Patient presents for Retina Follow Up   HISTORY OF PRESENT ILLNESS: Alicia West is a 50 y.o. female who presents to the clinic today for:   HPI     Retina Follow Up   Patient presents with  Diabetic Retinopathy.  In right eye.  Duration of 3 weeks.  I, the attending physician,  performed the HPI with the patient and updated documentation appropriately.        Comments   3 week follow up NPDR OD- Seeing FOLs x2 weeks OD.  Has had an increase in floaters OD.  Vision isn't as good as well.  BS 130's A1C 11      Last edited by Bernarda Caffey, MD on 08/17/2020  9:37 PM.    Pt states a couple days after she had PRP she started seeing fol in the right eye, she states she still sees them "every now and then"  Referring physician: McDiarmid, Blane Ohara, MD [place],  [place] [address]  HISTORICAL INFORMATION:   Selected notes from the [facility] Patient referred by Dr. Jola Schmidt for diabetic retinal eval   CURRENT MEDICATIONS: No current outpatient medications on file. (Ophthalmic Drugs)   No current facility-administered medications for this visit. (Ophthalmic Drugs)   Current Outpatient Medications (Other)  Medication Sig   acetaminophen (TYLENOL) 500 MG tablet Take 500 mg by mouth every 6 (six) hours as needed.   atorvastatin (LIPITOR) 40 MG tablet Take 1 tablet (40 mg total) by mouth 2 (two) times a week.   carvedilol (COREG) 25 MG tablet TAKE ONE TABLET BY MOUTH TWICE A DAY WITH MEALS   gabapentin (NEURONTIN) 100 MG capsule TAKE ONE CAPSULE BY MOUTH EVERY NIGHT AT BEDTIME   Insulin Glargine (BASAGLAR KWIKPEN) 100 UNIT/ML INJECT 34 UNITS UNDER THE SKIN DAILY   Lidocaine 2 % GEL Apply 1 application topically as needed.   linaclotide (LINZESS) 145 MCG CAPS capsule Take 1 capsule (145 mcg total) by mouth daily.   losartan-hydrochlorothiazide (HYZAAR) 100-25 MG  tablet TAKE ONE TABLET BY MOUTH DAILY   metFORMIN (GLUCOPHAGE) 500 MG tablet TAKE ONE TABLET BY MOUTH TWICE A DAY WITH A MEAL   omeprazole (PRILOSEC) 20 MG capsule Take 1 capsule (20 mg total) by mouth daily.   Prenatal Vit-Fe Fum-Fe Bisg-FA (NATACHEW) 28-1 MG CHEW Chew 1 tablet by mouth at bedtime.    Semaglutide, 1 MG/DOSE, 4 MG/3ML SOPN Inject 1 mg into the skin once a week.   spironolactone (ALDACTONE) 25 MG tablet TAKE ONE TABLET BY MOUTH DAILY   glucose blood (BAYER CONTOUR NEXT TEST) test strip Use to check blood sugar three times a day or as directed.   ibuprofen (ADVIL) 400 MG tablet Take by mouth daily as needed. (Patient not taking: No sig reported)   Insulin Pen Needle (TECHLITE PEN NEEDLES) 32G X 6 MM MISC Use to inject insulin daily as instructed   Lancets MISC Use Contour Next Lancet to check blood glucose 3 times a day.  Dx code E11.65. (Patient not taking: No sig reported)   Syringe, Disposable, 1 ML MISC 1 each by Does not apply route 3 (three) times daily.   No current facility-administered medications for this visit. (Other)      REVIEW OF SYSTEMS: ROS   Positive for: Gastrointestinal, Genitourinary, Endocrine, Eyes, Respiratory Negative for: Constitutional, Neurological, Skin, Musculoskeletal, HENT, Cardiovascular, Psychiatric, Allergic/Imm, Heme/Lymph Last edited  by Leonie Douglas, COA on 08/17/2020  2:28 PM.         ALLERGIES Allergies  Allergen Reactions   Ace Inhibitors Cough   Lipitor [Atorvastatin] Other (See Comments)    Muscle aches in thighs   Norvasc [Amlodipine] Swelling    edema   Penicillins Itching and Rash    Able to tolerate cephalosporins   Metformin And Related     Malaise. Pt states this occurs when taking a high dosage.    PAST MEDICAL HISTORY Past Medical History:  Diagnosis Date   Allergy    seasonal sinus allergies   ANEMIA, IRON DEFICIENCY, UNSPEC. 04/04/2006   Qualifier: Diagnosis of  By: Samara Snide     Bilateral senile  cataracts 11/20/2018   Mixed OU   Binge-eating disorder, moderate 03/17/2013   Breast lump 07/22/2009   Qualifier: Diagnosis of  By: Netty Starring  MD, Lucianne Muss     CKD stage 3 secondary to diabetes ([place]) 10/17/2018   DEPRESSIVE DISORDER, NOS 04/04/2006   Qualifier: Diagnosis of  By: Samara Snide     Diabetes mellitus    on meds   Diabetic macular edema ([place])    by patient report 10/27/2019   Diabetic neuropathy ([place]) 10/17/2018   Diabetic peripheral neuropathy ([place])    per patient   Diabetic visual loss: severe vision impairment of both eyes, with macular edema, with severe nonproliferative retinopathy, associated with type 2 diabetes mellitus ([place]) 23/55/7322       Folliculitis 0/25/4270   Heavy menses    Pt reports she was to get a hysterectomy   Herpes labialis 09/06/2010   Herpes simplex labialis    Hypercalcemia 08/13/2019   Hyperlipidemia    diet controlled/not taking meds for this   Hypertension    on meds   Hypertensive retinopathy    OU   Menorrhagia 04/04/2006   Qualifier: Diagnosis of  By: Samara Snide     Morbid obesity ([place]) 05/01/2013   Neuromuscular disorder ([place])    Obstructive sleep apnea 11/27/2019   Osteoporosis    bilateral hips R>L   Paroxysmal supraventricular tachycardia ([place]) 10/26/2014   PONV (postoperative nausea and vomiting)    POSITIVE PPD 10/18/2009   Qualifier: Diagnosis of  By: Zebedee Iba NP, Manuela Schwartz     Prurigo nodularis 10/17/2018   RHINITIS, ALLERGIC 04/04/2006   Qualifier: Diagnosis of  By: Samara Snide     Sleep apnea    TB (tuberculosis) contact    + skin test,/- chest x-ray - in mid 2000's   Thrombocytosis 08/18/2019   Tuberculosis    postitive 2011-[facility] per pt   Tubular adenoma of colon 2021   2 mm sessile, Rikki Spearing MD ([facility])   Visual impairment of left eye 08/18/2019   Past Surgical History:  Procedure Laterality Date   REDUCTION MAMMAPLASTY     TONSILLECTOMY     TONSILLECTOMY     WISDOM TOOTH EXTRACTION       FAMILY HISTORY Family History  Problem Relation Age of Onset   Hypertension Mother    Diabetes Mellitus II Mother    Congestive Heart Failure Mother        Deceased from complications of CHF   Hypertension Father    Diabetes Mellitus II Father    Colon polyps Father 37   Colon cancer Father 65   Diabetes Mellitus II Brother    Colon polyps Brother 44   Colon polyps Maternal Uncle 62   Colon cancer Maternal Uncle 62  Esophageal cancer Neg Hx    Stomach cancer Neg Hx    Rectal cancer Neg Hx     SOCIAL HISTORY Social History   Tobacco Use   Smoking status: Never   Smokeless tobacco: Never  Vaping Use   Vaping Use: Never used  Substance Use Topics   Alcohol use: Yes    Comment: Occas   Drug use: No         OPHTHALMIC EXAM:  Base Eye Exam     Visual Acuity (Snellen - Linear)       Right Left   Dist [place] 20/80 20/100+   Dist cc 20/40+ 20/30  I put last MR in phoropter  OD -1.75 sph  OS -1.75 sph         Tonometry (Tonopen, 2:41 PM)       Right Left   Pressure 18 19         Pupils       Dark Light Shape React APD   Right 3 2 Round Brisk None   Left 3 2 Round Brisk None         Visual Fields (Counting fingers)       Left Right    Full Full         Extraocular Movement       Right Left    Full Full         Neuro/Psych     Oriented x3: Yes   Mood/Affect: Normal         Dilation     Both eyes: 1.0% Mydriacyl, 2.5% Phenylephrine @ 2:41 PM           Slit Lamp and Fundus Exam     Slit Lamp Exam       Right Left   Lids/Lashes Dermatochalasis - upper lid Dermatochalasis - upper lid   Conjunctiva/Sclera Mild Melanosis Mild Melanosis   Cornea trace i8nferior PEE trace inferior PEE   Anterior Chamber Deep and quiet Deep and quiet   Iris Round and dilated Round and dilated   Lens 2+ Nuclear sclerosis, 2+ Cortical cataract, trace Posterior subcapsular cataract 2+ Nuclear sclerosis, 2+ Cortical cataract, trace PSC    Vitreous Mild Vitreous syneresis Mild Vitreous syneresis         Fundus Exam       Right Left   Disc Pink and Sharp, +cupping Pink and Sharp, +cupping   C/D Ratio 0.6 0.6   Macula Good foveal reflex, +MA, +exudates / edema greatest temporal macula -- persistent Flat, good foveal reflex, +Microaneurysms and exudates, +edema central and temporal macula -- slightly improved   Vessels attenuated, mild tortuousity, mild Copper wiring attenuated, Tortuous, mild Copper wiring, +NV along IN arcades   Periphery Attached, scattered MA, good early PRP changes 360 Attached, scattered MA, 360 PRP           Refraction     Manifest Refraction       Sphere Cylinder Dist VA   Right -2.50 Sphere 20/25   Left               IMAGING AND PROCEDURES  Imaging and Procedures for @TODAY @  OCT, Retina - OU - Both Eyes       Right Eye Quality was good. Central Foveal Thickness: 232. Progression has improved. Findings include normal foveal contour, intraretinal fluid, intraretinal hyper-reflective material, no SRF, vitreomacular adhesion (Mild Interval improvement in IRF/ IRHM temporal macula).   Left Eye Quality was good. Central Foveal Thickness:  314. Progression has improved. Findings include abnormal foveal contour, intraretinal fluid, intraretinal hyper-reflective material, no SRF, vitreomacular adhesion (Interval improvement IRF / IRHM central and temporal macula).   Notes *Images captured and stored on drive  Diagnosis / Impression:  +DME OU OD: Interval improvement in IRF/ IRHM temporal macula OS: Interval improvement IRF / IRHM temporal fovea and macula  Clinical management:  See below  Abbreviations: NFP - Normal foveal profile. CME - cystoid macular edema. PED - pigment epithelial detachment. IRF - intraretinal fluid. SRF - subretinal fluid. EZ - ellipsoid zone. ERM - epiretinal membrane. ORA - outer retinal atrophy. ORT - outer retinal tubulation. SRHM - subretinal  hyper-reflective material      Intravitreal Injection, Pharmacologic Agent - OD - Right Eye       Time Out 08/17/2020. 3:40 PM. Confirmed correct patient, procedure, site, and patient consented.   Anesthesia Topical anesthesia was used. Anesthetic medications included Lidocaine 2%, Proparacaine 0.5%.   Procedure Preparation included eyelid speculum, 5% betadine to ocular surface. A supplied (32g) needle was used.   Injection: 1.25 mg Bevacizumab 1.27m/0.05ml   Route: Intravitreal, Site: Right Eye   NDC: 50242-060-01, Lot:: [id number] Expiration date: 09/16/2020, Waste: 0.05 mL   Post-op Post injection exam found visual acuity of at least counting fingers. The patient tolerated the procedure well. There were no complications. The patient received written and verbal post procedure care education. Post injection medications were not given.      Intravitreal Injection, Pharmacologic Agent - OS - Left Eye       Time Out 08/17/2020. 3:42 PM. Confirmed correct patient, procedure, site, and patient consented.   Anesthesia Topical anesthesia was used. Anesthetic medications included Lidocaine 2%, Proparacaine 0.5%.   Procedure Preparation included 5% betadine to ocular surface, eyelid speculum. A supplied needle was used.   Injection: 1.25 mg Bevacizumab 1.24m0.05ml   Route: Intravitreal, Site: Left Eye   NDC: : 02725-366-44Lot: : [id number] date: 09/19/2020, Waste: 0 mL   Post-op Post injection exam found visual acuity of at least counting fingers. The patient tolerated the procedure well. There were no complications. The patient received written and verbal post procedure care education. Post injection medications were not given.              ASSESSMENT/PLAN:    ICD-10-CM   1. Severe nonproliferative diabetic retinopathy of right eye with macular edema associated with type 2 diabetes mellitus (HCC)  E1Z56.3875ntravitreal Injection, Pharmacologic Agent - OD - Right  Eye    Bevacizumab (AVASTIN) SOLN 1.25 mg    2. Proliferative diabetic retinopathy of left eye with macular edema associated with type 2 diabetes mellitus (HCC)  E1I43.3295ntravitreal Injection, Pharmacologic Agent - OS - Left Eye    Bevacizumab (AVASTIN) SOLN 1.25 mg    3. Retinal edema  H35.81 OCT, Retina - OU - Both Eyes    4. Essential hypertension  I10     5. Hypertensive retinopathy of both eyes  H35.033     6. Combined forms of age-related cataract of both eyes  H25.813      1-3. Severe nonproliferative diabetic retinopathy, OD  - OS -- interval conversion to PDR -- noted 3.18.22  - lost to f/u here from 01/2019 to 04/2020 -- s/p IVA OS #1 (12.15.20), #2 (03.18.22)  - [facility] [place] not been seen since August of 2018 -- s/p multiple IVA OU - FA 12.15.20 shows scattered leaking MA OU, vascular perfusion  defects OU, no NV OU  - repeat FA (03.18.22) shows new retinal NV OS   - s/p IVA OS #1 (12.16.20), #2 (03.18.22), #3 (04.15.22), #4 (05.13.22), #5 (06.10.22)  - s/p IVA OD #1 (04.15.22), #2 (05.13.22), #3 (06.10.22)  - s/p PRP OS (04.01.22)  - s/p PRP OD (06.22.22)  - exam shows scattered IRH and exudates OU  - OCT shows OD: Interval improvement in IRF/ IRHM temporal macula; OS: Interval improvement IRF / IRHM temporal fovea and macula  - BCVA 20/40 OD (stable), 20/30 OS (improved)  - recommend IVA OU (OD #4 and OS #6) today, 07.13.22   - pt wishes to proceed with injections  - RBA of procedure discussed, questions answered  - informed consent obtained and signed  - see procedure note  - f/u 4 weeks, DFE, OCT, possible injections  4,5. Hypertensive retinopathy OU  - discussed importance of tight BP control  - monitor  6. Mixed form age related cataract OU -- mild  - under the expert management of Dr. Valetta Close  - The symptoms of cataract, surgical options, and treatments and risks were discussed with  patient.   - discussed association with and progression of PSC with DM   Ophthalmic Meds Ordered this visit:  Meds ordered this encounter  Medications   Bevacizumab (AVASTIN) SOLN 1.25 mg   Bevacizumab (AVASTIN) SOLN 1.25 mg      Return in about 4 weeks (around 09/14/2020) for f/u NPDR OU, DFE, OCT.  There are no Patient Instructions on file for this visit.    This document serves as a record of services personally performed by Gardiner Sleeper, MD, PhD. It was created on their behalf by Roselee Nova, COMT. The creation of this record is the provider's dictation and/or activities during the visit.  Electronically signed by: Roselee Nova, COMT 08/17/20 9:40 PM  This document serves as a record of services personally performed by Gardiner Sleeper, MD, PhD. It was created on their behalf by San Jetty. Owens Shark, OA an ophthalmic technician. The creation of this record is the provider's dictation and/or activities during the visit.    Electronically signed by: San Jetty. Marguerita Merles 07.13.2022 9:40 PM  Gardiner Sleeper, M.D., Ph.D. Diseases & Surgery of the Retina and Vitreous [facility]  I have reviewed the above documentation for accuracy and completeness, and I agree with the above. Gardiner Sleeper, M.D., Ph.D. 08/17/20 9:44 PM   Abbreviations: M myopia (nearsighted); A astigmatism; H hyperopia (farsighted); P presbyopia; Mrx spectacle prescription;  CTL contact lenses; OD right eye; OS left eye; OU both eyes  XT exotropia; ET esotropia; PEK punctate epithelial keratitis; PEE punctate epithelial erosions; DES dry eye syndrome; MGD meibomian gland dysfunction; ATs artificial tears; PFAT's preservative free artificial tears; [place] nuclear sclerotic cataract; PSC posterior subcapsular cataract; ERM epi-retinal membrane; PVD posterior vitreous detachment; RD retinal detachment; DM diabetes mellitus; DR diabetic retinopathy; NPDR non-proliferative diabetic retinopathy; PDR  proliferative diabetic retinopathy; CSME clinically significant macular edema; DME diabetic macular edema; dbh dot blot hemorrhages; CWS cotton wool spot; POAG primary open angle glaucoma; C/D cup-to-disc ratio; HVF humphrey visual field; GVF goldmann visual field; OCT optical coherence tomography; IOP intraocular pressure; BRVO Branch retinal vein occlusion; CRVO central retinal vein occlusion; CRAO central retinal artery occlusion; BRAO branch retinal artery occlusion; RT retinal tear; SB scleral buckle; PPV pars plana vitrectomy; VH Vitreous hemorrhage; PRP panretinal laser photocoagulation; IVK intravitreal kenalog; VMT vitreomacular traction; MH Macular hole;  NVD  neovascularization of the disc; NVE neovascularization elsewhere; AREDS age related eye disease study; ARMD age related macular degeneration; POAG primary open angle glaucoma; EBMD epithelial/anterior basement membrane dystrophy; ACIOL anterior chamber intraocular lens; IOL intraocular lens; PCIOL posterior chamber intraocular lens; Phaco/IOL phacoemulsification with intraocular lens placement; [place] photorefractive keratectomy; LASIK laser assisted in situ keratomileusis; HTN hypertension; DM diabetes mellitus; COPD chronic obstructive pulmonary disease

## 2020-08-17 ENCOUNTER — Other Ambulatory Visit: Payer: Self-pay

## 2020-08-17 ENCOUNTER — Ambulatory Visit (INDEPENDENT_AMBULATORY_CARE_PROVIDER_SITE_OTHER): Payer: 59 | Admitting: Ophthalmology

## 2020-08-17 ENCOUNTER — Encounter (INDEPENDENT_AMBULATORY_CARE_PROVIDER_SITE_OTHER): Payer: Self-pay | Admitting: Ophthalmology

## 2020-08-17 DIAGNOSIS — E113512 Type 2 diabetes mellitus with proliferative diabetic retinopathy with macular edema, left eye: Secondary | ICD-10-CM

## 2020-08-17 DIAGNOSIS — E113411 Type 2 diabetes mellitus with severe nonproliferative diabetic retinopathy with macular edema, right eye: Secondary | ICD-10-CM | POA: Diagnosis not present

## 2020-08-17 DIAGNOSIS — I10 Essential (primary) hypertension: Secondary | ICD-10-CM

## 2020-08-17 DIAGNOSIS — H3581 Retinal edema: Secondary | ICD-10-CM

## 2020-08-17 DIAGNOSIS — H35033 Hypertensive retinopathy, bilateral: Secondary | ICD-10-CM

## 2020-08-17 DIAGNOSIS — H25813 Combined forms of age-related cataract, bilateral: Secondary | ICD-10-CM

## 2020-08-17 MED ORDER — BEVACIZUMAB CHEMO INJECTION 1.25MG/0.05ML SYRINGE FOR KALEIDOSCOPE
1.2500 mg | INTRAVITREAL | Status: AC | PRN
  Administered 2020-08-17: 1.25 mg via INTRAVITREAL

## 2020-09-13 NOTE — Progress Notes (Signed)
[facility] Note  09/14/2020     CHIEF COMPLAINT Patient presents for Retina Follow Up   HISTORY OF PRESENT ILLNESS: Alicia West is a 50 y.o. female who presents to the clinic today for:   HPI     Retina Follow Up   Patient presents with  Diabetic Retinopathy.  In both eyes.  I, the attending physician,  performed the HPI with the patient and updated documentation appropriately.        Comments   Patient here for 4 weeks retina follow up for NPDR OU. Patient states vision about the same. No eye pain.       Last edited by Bernarda Caffey, MD on 09/14/2020  4:51 PM.      Referring physician: McDiarmid, Blane Ohara, MD [place],  [place] [address]  HISTORICAL INFORMATION:   Selected notes from the [facility] Patient referred by Dr. Jola Schmidt for diabetic retinal eval   CURRENT MEDICATIONS: No current outpatient medications on file. (Ophthalmic Drugs)   No current facility-administered medications for this visit. (Ophthalmic Drugs)   Current Outpatient Medications (Other)  Medication Sig   acetaminophen (TYLENOL) 500 MG tablet Take 500 mg by mouth every 6 (six) hours as needed.   atorvastatin (LIPITOR) 40 MG tablet Take 1 tablet (40 mg total) by mouth 2 (two) times a week.   carvedilol (COREG) 25 MG tablet TAKE ONE TABLET BY MOUTH TWICE A DAY WITH MEALS   gabapentin (NEURONTIN) 100 MG capsule TAKE ONE CAPSULE BY MOUTH EVERY NIGHT AT BEDTIME   glucose blood (BAYER CONTOUR NEXT TEST) test strip Use to check blood sugar three times a day or as directed.   ibuprofen (ADVIL) 400 MG tablet Take by mouth daily as needed. (Patient not taking: No sig reported)   Insulin Glargine (BASAGLAR KWIKPEN) 100 UNIT/ML INJECT 34 UNITS UNDER THE SKIN DAILY   Insulin Pen Needle (TECHLITE PEN NEEDLES) 32G X 6 MM MISC Use to inject insulin daily as instructed   Lancets MISC Use Contour Next Lancet to check blood glucose 3 times a  day.  Dx code E11.65. (Patient not taking: No sig reported)   Lidocaine 2 % GEL Apply 1 application topically as needed.   linaclotide (LINZESS) 145 MCG CAPS capsule Take 1 capsule (145 mcg total) by mouth daily.   losartan-hydrochlorothiazide (HYZAAR) 100-25 MG tablet TAKE ONE TABLET BY MOUTH DAILY   metFORMIN (GLUCOPHAGE) 500 MG tablet TAKE ONE TABLET BY MOUTH TWICE A DAY WITH A MEAL   omeprazole (PRILOSEC) 20 MG capsule Take 1 capsule (20 mg total) by mouth daily.   Prenatal Vit-Fe Fum-Fe Bisg-FA (NATACHEW) 28-1 MG CHEW Chew 1 tablet by mouth at bedtime.    Semaglutide, 1 MG/DOSE, 4 MG/3ML SOPN Inject 1 mg into the skin once a week.   spironolactone (ALDACTONE) 25 MG tablet TAKE ONE TABLET BY MOUTH DAILY   Syringe, Disposable, 1 ML MISC 1 each by Does not apply route 3 (three) times daily.   No current facility-administered medications for this visit. (Other)   REVIEW OF SYSTEMS: ROS   Positive for: Gastrointestinal, Genitourinary, Endocrine, Eyes, Respiratory Negative for: Constitutional, Neurological, Skin, Musculoskeletal, HENT, Cardiovascular, Psychiatric, Allergic/Imm, Heme/Lymph Last edited by Theodore Demark, COA on 09/14/2020  3:01 PM.     ALLERGIES Allergies  Allergen Reactions   Ace Inhibitors Cough   Lipitor [Atorvastatin] Other (See Comments)    Muscle aches in thighs   Norvasc [Amlodipine] Swelling  edema   Penicillins Itching and Rash    Able to tolerate cephalosporins   Metformin And Related     Malaise. Pt states this occurs when taking a high dosage.    PAST MEDICAL HISTORY Past Medical History:  Diagnosis Date   Allergy    seasonal sinus allergies   ANEMIA, IRON DEFICIENCY, UNSPEC. 04/04/2006   Qualifier: Diagnosis of  By: Samara Snide     Bilateral senile cataracts 11/20/2018   Mixed OU   Binge-eating disorder, moderate 03/17/2013   Breast lump 07/22/2009   Qualifier: Diagnosis of  By: Netty Starring  MD, Lucianne Muss     CKD stage 3 secondary to diabetes  ([place]) 10/17/2018   DEPRESSIVE DISORDER, NOS 04/04/2006   Qualifier: Diagnosis of  By: Samara Snide     Diabetes mellitus    on meds   Diabetic macular edema ([place])    by patient report 10/27/2019   Diabetic neuropathy ([place]) 10/17/2018   Diabetic peripheral neuropathy ([place])    per patient   Diabetic visual loss: severe vision impairment of both eyes, with macular edema, with severe nonproliferative retinopathy, associated with type 2 diabetes mellitus ([place]) 97/35/3299       Folliculitis 2/42/6834   Heavy menses    Pt reports she was to get a hysterectomy   Herpes labialis 09/06/2010   Herpes simplex labialis    Hypercalcemia 08/13/2019   Hyperlipidemia    diet controlled/not taking meds for this   Hypertension    on meds   Hypertensive retinopathy    OU   Menorrhagia 04/04/2006   Qualifier: Diagnosis of  By: Samara Snide     Morbid obesity ([place]) 05/01/2013   Neuromuscular disorder ([place])    Obstructive sleep apnea 11/27/2019   Osteoporosis    bilateral hips R>L   Paroxysmal supraventricular tachycardia ([place]) 10/26/2014   PONV (postoperative nausea and vomiting)    POSITIVE PPD 10/18/2009   Qualifier: Diagnosis of  By: Zebedee Iba NP, Manuela Schwartz     Prurigo nodularis 10/17/2018   RHINITIS, ALLERGIC 04/04/2006   Qualifier: Diagnosis of  By: Samara Snide     Sleep apnea    TB (tuberculosis) contact    + skin test,/- chest x-ray - in mid 2000's   Thrombocytosis 08/18/2019   Tuberculosis    postitive 2011-[facility]-cleared per pt   Tubular adenoma of colon 2021   2 mm sessile, Rikki Spearing MD ([facility])   Visual impairment of left eye 08/18/2019   Past Surgical History:  Procedure Laterality Date   REDUCTION MAMMAPLASTY     TONSILLECTOMY     TONSILLECTOMY     WISDOM TOOTH EXTRACTION      FAMILY HISTORY Family History  Problem Relation Age of Onset   Hypertension Mother    Diabetes Mellitus II Mother    Congestive Heart Failure Mother        Deceased from complications of CHF    Hypertension Father    Diabetes Mellitus II Father    Colon polyps Father 71   Colon cancer Father 67   Diabetes Mellitus II Brother    Colon polyps Brother 56   Colon polyps Maternal Uncle 62   Colon cancer Maternal Uncle 62   Esophageal cancer Neg Hx    Stomach cancer Neg Hx    Rectal cancer Neg Hx     SOCIAL HISTORY Social History   Tobacco Use   Smoking status: Never   Smokeless tobacco: Never  Vaping Use   Vaping Use: Never  used  Substance Use Topics   Alcohol use: Yes    Comment: Occas   Drug use: No         OPHTHALMIC EXAM:  Base Eye Exam     Visual Acuity (Snellen - Linear)       Right Left   Dist [place] 20/60 +2 20/50 -2   Dist [address] 20/25 -2 20/40         Tonometry (Tonopen, 2:59 PM)       Right Left   Pressure 22 21         Pupils       Dark Light Shape React APD   Right 3 2 Round Brisk None   Left 3 2 Round Brisk None         Visual Fields (Counting fingers)       Left Right    Full Full         Extraocular Movement       Right Left    Full Full         Neuro/Psych     Oriented x3: Yes   Mood/Affect: Normal         Dilation     Both eyes: 1.0% Mydriacyl, 2.5% Phenylephrine @ 2:59 PM           Slit Lamp and Fundus Exam     Slit Lamp Exam       Right Left   Lids/Lashes Dermatochalasis - upper lid Dermatochalasis - upper lid   Conjunctiva/Sclera Mild Melanosis Mild Melanosis   Cornea trace inferior PEE trace inferior PEE   Anterior Chamber Deep and quiet Deep and quiet   Iris Round and dilated Round and dilated   Lens 2+ Nuclear sclerosis, 2+ Cortical cataract, trace Posterior subcapsular cataract 2+ Nuclear sclerosis, 2+ Cortical cataract, trace PSC   Vitreous Mild Vitreous syneresis Mild Vitreous syneresis         Fundus Exam       Right Left   Disc Pink and Sharp, +cupping Pink and Sharp, +cupping   C/D Ratio 0.6 0.6   Macula Good foveal reflex, +MA, +exudates / edema greatest temporal macula --  persistent/slightly improved Flat, good foveal reflex, +Microaneurysms and exudates, +edema central and temporal macula -- slightly improved   Vessels attenuated, mild tortuousity, mild Copper wiring attenuated, Tortuous, mild Copper wiring, +NV along IN arcades   Periphery Attached, scattered MA, good PRP changes 360 Attached, scattered MA, 360 PRP            IMAGING AND PROCEDURES  Imaging and Procedures for @TODAY @  OCT, Retina - OU - Both Eyes       Right Eye Quality was good. Central Foveal Thickness: 234. Progression has improved. Findings include normal foveal contour, intraretinal fluid, intraretinal hyper-reflective material, no SRF, vitreomacular adhesion (Mild Interval improvement in IRF/ IRHM temporal macula).   Left Eye Quality was good. Central Foveal Thickness: 298. Progression has improved. Findings include abnormal foveal contour, intraretinal fluid, intraretinal hyper-reflective material, no SRF, vitreomacular adhesion (Mild Interval improvement IRF / IRHM central and temporal macula).   Notes *Images captured and stored on drive  Diagnosis / Impression:  +DME OU OD: mild interval improvement in IRF/ IRHM temporal macula OS: Mild Interval improvement IRF / IRHM temporal fovea and macula  Clinical management:  See below  Abbreviations: NFP - Normal foveal profile. CME - cystoid macular edema. PED - pigment epithelial detachment. IRF - intraretinal fluid. SRF - subretinal fluid. EZ - ellipsoid  zone. ERM - epiretinal membrane. ORA - outer retinal atrophy. ORT - outer retinal tubulation. SRHM - subretinal hyper-reflective material      Intravitreal Injection, Pharmacologic Agent - OD - Right Eye       Time Out 09/14/2020. 3:38 PM. Confirmed correct patient, procedure, site, and patient consented.   Anesthesia Topical anesthesia was used. Anesthetic medications included Lidocaine 2%, Proparacaine 0.5%.   Procedure Preparation included 5% betadine to ocular  surface, eyelid speculum. A supplied needle was used.   Injection: 1.25 mg Bevacizumab 1.64m/0.05ml   Route: Intravitreal, Site: Right Eye   NDC: 50242-060-01, Lot:: [id number] Expiration date: 10/25/2020, Waste: 0 mL   Post-op Post injection exam found visual acuity of at least counting fingers. The patient tolerated the procedure well. There were no complications. The patient received written and verbal post procedure care education. Post injection medications were not given.      Intravitreal Injection, Pharmacologic Agent - OS - Left Eye       Time Out 09/14/2020. 3:39 PM. Confirmed correct patient, procedure, site, and patient consented.   Anesthesia Topical anesthesia was used. Anesthetic medications included Lidocaine 2%, Proparacaine 0.5%.   Procedure Preparation included 5% betadine to ocular surface, eyelid speculum. A (32g) needle was used.   Injection: 1.25 mg Bevacizumab 1.29m0.05ml   Route: Intravitreal, Site: Left Eye   NDC: [id number]: : [id number] date: 10/25/2020, Waste: 0.05 mL   Post-op Post injection exam found visual acuity of at least counting fingers. The patient tolerated the procedure well. There were no complications. The patient received written and verbal post procedure care education.            ASSESSMENT/PLAN:    ICD-10-CM   1. Severe nonproliferative diabetic retinopathy of right eye with macular edema associated with type 2 diabetes mellitus (HCC)  E1D92.4268ntravitreal Injection, Pharmacologic Agent - OD - Right Eye    Bevacizumab (AVASTIN) SOLN 1.25 mg    2. Proliferative diabetic retinopathy of left eye with macular edema associated with type 2 diabetes mellitus (HCC)  E1T41.9622ntravitreal Injection, Pharmacologic Agent - OS - Left Eye    Bevacizumab (AVASTIN) SOLN 1.25 mg    3. Retinal edema  H35.81 OCT, Retina - OU - Both Eyes    4. Essential hypertension  I10     5. Hypertensive retinopathy of both eyes  H35.033      6. Combined forms of age-related cataract of both eyes  H25.813       1-3. Severe nonproliferative diabetic retinopathy, OD  - OS -- interval conversion to PDR -- noted 3.18.22  - lost to f/u here from 01/2019 to 04/2020 -- s/p IVA OS #1 (12.15.20), #2 (03.18.22)  - [facility] [place]val Linseynd [place] has not been seen since August of 2018 -- s/p multiple IVA OU - FA 12.15.20 shows scattered leaking MA OU, vascular perfusion defects OU, no NV OU  - repeat FA (03.18.22) shows new retinal NV OS   - s/p IVA OS #1 (12.16.20), #2 (03.18.22), #3 (04.15.22), #4 (05.13.22), #5 (06.10.22), #6 (07.13.22)  - s/p IVA OD #1 (04.15.22), #2 (05.13.22), #3 (06.10.22), #4 (07.13.22)  - s/p PRP OS (04.01.22)  - s/p PRP OD (06.22.22)  - exam shows scattered IRH and exudates OU  - OCT shows OD: Interval improvement in IRF/ IRHM temporal macula; OS: Interval improvement IRF / IRHM temporal fovea and macula  - BCVA 20/25 OD (improved), 20/40 OS (decreased)  - recommend IVA OU (  OD #5 and OS #7) today, 08.10.22   - pt wishes to proceed with injections  - RBA of procedure discussed, questions answered  - informed consent obtained and signed  - see procedure note  - f/u 4 weeks, DFE, OCT, possible injections  4,5. Hypertensive retinopathy OU  - discussed importance of tight BP control  - monitor  6. Mixed form age related cataract OU -- mild  - under the expert management of Dr. Valetta Close  - The symptoms of cataract, surgical options, and treatments and risks were discussed with patient.   - discussed association with and progression of PSC with DM   Ophthalmic Meds Ordered this visit:  Meds ordered this encounter  Medications   Bevacizumab (AVASTIN) SOLN 1.25 mg   Bevacizumab (AVASTIN) SOLN 1.25 mg     Return in about 4 weeks (around 10/12/2020) for 4 weeks NPDR OU, DFE/OCT.  There are no Patient Instructions on file for this visit.    This document  serves as a record of services personally performed by Gardiner Sleeper, MD, PhD. It was created on their behalf by Roselee Nova, COMT. The creation of this record is the provider's dictation and/or activities during the visit.  Electronically signed by: Roselee Nova, COMT 09/14/20 4:57 PM  Gardiner Sleeper, M.D., Ph.D. Diseases & Surgery of the Retina and Vitreous [facility]  I have reviewed the above documentation for accuracy and completeness, and I agree with the above. Gardiner Sleeper, M.D., Ph.D. 09/14/20 4:57 PM  Abbreviations: M myopia (nearsighted); A astigmatism; H hyperopia (farsighted); P presbyopia; Mrx spectacle prescription;  CTL contact lenses; OD right eye; OS left eye; OU both eyes  XT exotropia; ET esotropia; PEK punctate epithelial keratitis; PEE punctate epithelial erosions; DES dry eye syndrome; MGD meibomian gland dysfunction; ATs artificial tears; PFAT's preservative free artificial tears; [place] nuclear sclerotic cataract; PSC posterior subcapsular cataract; ERM epi-retinal membrane; PVD posterior vitreous detachment; RD retinal detachment; DM diabetes mellitus; DR diabetic retinopathy; NPDR non-proliferative diabetic retinopathy; PDR proliferative diabetic retinopathy; CSME clinically significant macular edema; DME diabetic macular edema; dbh dot blot hemorrhages; CWS cotton wool spot; POAG primary open angle glaucoma; C/D cup-to-disc ratio; HVF humphrey visual field; GVF goldmann visual field; OCT optical coherence tomography; IOP intraocular pressure; BRVO Branch retinal vein occlusion; CRVO central retinal vein occlusion; CRAO central retinal artery occlusion; BRAO branch retinal artery occlusion; RT retinal tear; SB scleral buckle; PPV pars plana vitrectomy; VH Vitreous hemorrhage; PRP panretinal laser photocoagulation; IVK intravitreal kenalog; VMT vitreomacular traction; MH Macular hole;  NVD neovascularization of the disc; NVE neovascularization  elsewhere; AREDS age related eye disease study; ARMD age related macular degeneration; POAG primary open angle glaucoma; EBMD epithelial/anterior basement membrane dystrophy; ACIOL anterior chamber intraocular lens; IOL intraocular lens; PCIOL posterior chamber intraocular lens; Phaco/IOL phacoemulsification with intraocular lens placement; [place] photorefractive keratectomy; LASIK laser assisted in situ keratomileusis; HTN hypertension; DM diabetes mellitus; COPD chronic obstructive pulmonary disease

## 2020-09-14 ENCOUNTER — Other Ambulatory Visit: Payer: Self-pay

## 2020-09-14 ENCOUNTER — Ambulatory Visit (INDEPENDENT_AMBULATORY_CARE_PROVIDER_SITE_OTHER): Payer: 59 | Admitting: Ophthalmology

## 2020-09-14 ENCOUNTER — Encounter (INDEPENDENT_AMBULATORY_CARE_PROVIDER_SITE_OTHER): Payer: Self-pay | Admitting: Ophthalmology

## 2020-09-14 DIAGNOSIS — E113512 Type 2 diabetes mellitus with proliferative diabetic retinopathy with macular edema, left eye: Secondary | ICD-10-CM

## 2020-09-14 DIAGNOSIS — H25813 Combined forms of age-related cataract, bilateral: Secondary | ICD-10-CM

## 2020-09-14 DIAGNOSIS — H3581 Retinal edema: Secondary | ICD-10-CM

## 2020-09-14 DIAGNOSIS — I10 Essential (primary) hypertension: Secondary | ICD-10-CM | POA: Diagnosis not present

## 2020-09-14 DIAGNOSIS — E113411 Type 2 diabetes mellitus with severe nonproliferative diabetic retinopathy with macular edema, right eye: Secondary | ICD-10-CM

## 2020-09-14 DIAGNOSIS — H35033 Hypertensive retinopathy, bilateral: Secondary | ICD-10-CM

## 2020-09-14 MED ORDER — BEVACIZUMAB CHEMO INJECTION 1.25MG/0.05ML SYRINGE FOR KALEIDOSCOPE
1.2500 mg | INTRAVITREAL | Status: AC | PRN
  Administered 2020-09-14: 1.25 mg via INTRAVITREAL

## 2020-10-12 ENCOUNTER — Encounter (INDEPENDENT_AMBULATORY_CARE_PROVIDER_SITE_OTHER): Payer: 59 | Admitting: Ophthalmology

## 2020-11-04 ENCOUNTER — Other Ambulatory Visit: Payer: Self-pay | Admitting: Family Medicine

## 2020-11-04 DIAGNOSIS — E1165 Type 2 diabetes mellitus with hyperglycemia: Secondary | ICD-10-CM

## 2020-11-04 DIAGNOSIS — E1169 Type 2 diabetes mellitus with other specified complication: Secondary | ICD-10-CM

## 2020-11-07 ENCOUNTER — Other Ambulatory Visit: Payer: Self-pay | Admitting: Family Medicine

## 2020-11-07 DIAGNOSIS — E7800 Pure hypercholesterolemia, unspecified: Secondary | ICD-10-CM

## 2020-11-07 DIAGNOSIS — E1169 Type 2 diabetes mellitus with other specified complication: Secondary | ICD-10-CM

## 2020-11-07 DIAGNOSIS — E1165 Type 2 diabetes mellitus with hyperglycemia: Secondary | ICD-10-CM

## 2020-11-09 ENCOUNTER — Other Ambulatory Visit: Payer: Self-pay | Admitting: Family Medicine

## 2020-11-09 ENCOUNTER — Telehealth: Payer: Self-pay

## 2020-11-09 DIAGNOSIS — E7800 Pure hypercholesterolemia, unspecified: Secondary | ICD-10-CM

## 2020-11-09 DIAGNOSIS — E1140 Type 2 diabetes mellitus with diabetic neuropathy, unspecified: Secondary | ICD-10-CM

## 2020-11-09 DIAGNOSIS — E1165 Type 2 diabetes mellitus with hyperglycemia: Secondary | ICD-10-CM

## 2020-11-09 MED ORDER — METFORMIN HCL 500 MG PO TABS: ORAL_TABLET | ORAL | 3 refills | Status: DC

## 2020-11-09 NOTE — Telephone Encounter (Signed)
Called patient.  Confirmed she could take lower dose metformin.  Refilled as requested.

## 2020-11-09 NOTE — Telephone Encounter (Signed)
Patient calls nurse line asking why her metformin refill has been denied. I advised her metformin is on her allergy list. Patient reports this is a mistake, only high doses of metformin make her sick. Patient reports she has been taking 548m BID with no side effects. Will forward to PCP.

## 2020-11-14 ENCOUNTER — Other Ambulatory Visit: Payer: Self-pay | Admitting: Family Medicine

## 2020-11-14 DIAGNOSIS — I10 Essential (primary) hypertension: Secondary | ICD-10-CM

## 2020-11-17 ENCOUNTER — Ambulatory Visit: Payer: 59

## 2020-11-24 ENCOUNTER — Other Ambulatory Visit: Payer: Self-pay

## 2020-11-24 ENCOUNTER — Encounter: Payer: Self-pay | Admitting: Family Medicine

## 2020-11-24 ENCOUNTER — Ambulatory Visit (INDEPENDENT_AMBULATORY_CARE_PROVIDER_SITE_OTHER): Payer: 59 | Admitting: Family Medicine

## 2020-11-24 VITALS — BP 118/79 | HR 100 | Ht 69.0 in | Wt 237.4 lb

## 2020-11-24 DIAGNOSIS — R600 Localized edema: Secondary | ICD-10-CM | POA: Diagnosis not present

## 2020-11-24 DIAGNOSIS — E1159 Type 2 diabetes mellitus with other circulatory complications: Secondary | ICD-10-CM

## 2020-11-24 DIAGNOSIS — E1169 Type 2 diabetes mellitus with other specified complication: Secondary | ICD-10-CM

## 2020-11-24 DIAGNOSIS — I152 Hypertension secondary to endocrine disorders: Secondary | ICD-10-CM

## 2020-11-24 DIAGNOSIS — Z794 Long term (current) use of insulin: Secondary | ICD-10-CM

## 2020-11-24 DIAGNOSIS — E1140 Type 2 diabetes mellitus with diabetic neuropathy, unspecified: Secondary | ICD-10-CM

## 2020-11-24 DIAGNOSIS — E1165 Type 2 diabetes mellitus with hyperglycemia: Secondary | ICD-10-CM

## 2020-11-24 DIAGNOSIS — E782 Mixed hyperlipidemia: Secondary | ICD-10-CM

## 2020-11-24 DIAGNOSIS — Z6835 Body mass index (BMI) 35.0-35.9, adult: Secondary | ICD-10-CM

## 2020-11-24 DIAGNOSIS — M79601 Pain in right arm: Secondary | ICD-10-CM

## 2020-11-24 DIAGNOSIS — E1149 Type 2 diabetes mellitus with other diabetic neurological complication: Secondary | ICD-10-CM

## 2020-11-24 DIAGNOSIS — Z79899 Other long term (current) drug therapy: Secondary | ICD-10-CM | POA: Diagnosis not present

## 2020-11-24 DIAGNOSIS — D500 Iron deficiency anemia secondary to blood loss (chronic): Secondary | ICD-10-CM

## 2020-11-24 LAB — POCT GLYCOSYLATED HEMOGLOBIN (HGB A1C): HbA1c, POC (controlled diabetic range): 8.4 % — AB (ref 0.0–7.0)

## 2020-11-24 MED ORDER — GABAPENTIN 100 MG PO CAPS: 300.0000 mg | ORAL_CAPSULE | Freq: Every day | ORAL | 3 refills | Status: DC

## 2020-11-24 NOTE — Patient Instructions (Addendum)
I am concerned about the pain in your arm.  It may be from your diabetes, but your arm on the circumfernce of your right arm in 3 cm larger than your left.  I think it is prudent to make sure there is no blockage in the blood vessel going to your right arm.  It is OK to use between one to three gabapentin tablets up to three times a day if needed for your pain.    We will revisit your diabetes sugar control at the next visit.  We talked about the addition of meal time insulin.    Dr Leonie Green [facility]) said to consider increaing the dose of your Spironolactone to decrease the testosteron stimulation of your skin that causes acne.    We are checking your cholesterol today.   You got your second Pneumonia vaccination and your Covid booster that covers the Omicron Covid variants.

## 2020-11-25 ENCOUNTER — Encounter: Payer: Self-pay | Admitting: Family Medicine

## 2020-11-25 ENCOUNTER — Other Ambulatory Visit: Payer: Self-pay | Admitting: Family Medicine

## 2020-11-25 DIAGNOSIS — I10 Essential (primary) hypertension: Secondary | ICD-10-CM

## 2020-11-25 DIAGNOSIS — Z6834 Body mass index (BMI) 34.0-34.9, adult: Secondary | ICD-10-CM | POA: Insufficient documentation

## 2020-11-25 DIAGNOSIS — Z6835 Body mass index (BMI) 35.0-35.9, adult: Secondary | ICD-10-CM | POA: Insufficient documentation

## 2020-11-25 DIAGNOSIS — Z794 Long term (current) use of insulin: Secondary | ICD-10-CM | POA: Insufficient documentation

## 2020-11-25 DIAGNOSIS — M79601 Pain in right arm: Secondary | ICD-10-CM

## 2020-11-25 HISTORY — DX: Pain in right arm: M79.601

## 2020-11-25 LAB — LDL CHOLESTEROL, DIRECT: LDL Direct: 85 mg/dL (ref 0–99)

## 2020-11-25 NOTE — Assessment & Plan Note (Signed)
See morbid obesity for assessment

## 2020-11-25 NOTE — Assessment & Plan Note (Signed)
Established problem Uncontrolled- A1c 8.4% (8.6% 01/22) Plan Continue: Basaglar 34 units daily; Metformin 500 mg twice a day, Ozempic 0.25 mg weekly.  Alicia West was tried on an SGLT2i by [facility] after their first consultation.  Patient soon developed yeast vaginitis.  She does not want to use this class of med. She has had nausea with Ozempic.  This may have limited her willingness to increase the dose.  She does not want to add a prandial insulin if she can avoid it.  She has lost 11 pounds since 03/22.  She wants to see how much more glycemic control she can get with her lifestyle changes.  Will recheck in 3 months.

## 2020-11-25 NOTE — Assessment & Plan Note (Signed)
CBC:    Component Value Date/Time   WBC 7.7 02/25/2020 1122   WBC 7.7 12/28/2019 1258   WBC 8.4 12/08/2019 1030   HGB 11.3 02/25/2020 1122   HCT 34.9 02/25/2020 1122   PLT 417 02/25/2020 1122   MCV 85 02/25/2020 1122   NEUTROABS 5.4 12/28/2019 1258   LYMPHSABS 1.5 12/28/2019 1258   MONOABS 0.4 12/28/2019 1258   EOSABS 0.4 12/28/2019 1258   BASOSABS 0.1 12/28/2019 1258   Resolved IDA. Alicia West continues on an PNV with iron daily and MVI with iron

## 2020-11-25 NOTE — Assessment & Plan Note (Signed)
Established problem Tolerating twice weekly atorvastatin 40 mg Checking ldl today

## 2020-11-25 NOTE — Assessment & Plan Note (Signed)
Established problem Uncontrolled  Continue: Basaglar 34 units qam

## 2020-11-25 NOTE — Progress Notes (Signed)
Good response to atorvastatin.  Continue it twice a week.

## 2020-11-25 NOTE — Assessment & Plan Note (Signed)
Established problem May contribute to patient's right arm pain.  Alicia West may increase gabapentin to 300 mg three times a day prn

## 2020-11-25 NOTE — Assessment & Plan Note (Signed)
Established problem Well Controlled. No signs of complications, medication side effects, or red flags. Continue current medications and other regiments.

## 2020-11-25 NOTE — Progress Notes (Signed)
Alicia West is alone Sources of clinical information for visit is/are patient, past medical records, and Pharmacy consultation. Nursing assessment for this office visit was reviewed with the patient for accuracy and revision.     Previous Report(s) Reviewed: lab reports, office notes, and most recent Pharmacy consultation notes  Depression screen [facility] 2/9 08/13/2019  Decreased Interest 0  Down, Depressed, Hopeless 0  PHQ - 2 Score 0  Altered sleeping -  Tired, decreased energy -  Change in appetite -  Feeling bad or failure about yourself  -  Trouble concentrating -  Moving slowly or fidgety/restless -  Suicidal thoughts -  PHQ-9 Score -  Some recent data might be hidden    Fall Risk  08/13/2019 01/28/2019 04/16/2018 01/31/2018 01/07/2018  Falls in the past year? 0 0 0 0 0  Number falls in past yr: 0 0 0 - 0  Injury with Fall? - - 0 - 0  Follow up Falls evaluation completed - Falls evaluation completed - -    PHQ9 SCORE ONLY 08/13/2019 07/29/2019 01/28/2019  PHQ-9 Total Score 0 19 3    Adult vaccines due  Topic Date Due   TETANUS/TDAP  05/29/2021    Health Maintenance Due  Topic Date Due   Zoster Vaccines- Shingrix (1 of 2) Never done   Pneumococcal Vaccine 21-72 Years old (2 - PCV) 04/20/2018   COVID-19 Vaccine (3 - Mixed Product risk series) 05/25/2019   INFLUENZA VACCINE  09/05/2020      History/P.E. limitations: none  Adult vaccines due  Topic Date Due   TETANUS/TDAP  05/29/2021    Diabetes Health Maintenance Due  Topic Date Due   FOOT EXAM  02/24/2021   OPHTHALMOLOGY EXAM  05/20/2021   HEMOGLOBIN A1C  05/25/2021    Health Maintenance Due  Topic Date Due   Zoster Vaccines- Shingrix (1 of 2) Never done   Pneumococcal Vaccine 46-30 Years old (2 - PCV) 04/20/2018   COVID-19 Vaccine (3 - Mixed Product risk series) 05/25/2019   INFLUENZA VACCINE  09/05/2020     Chief Complaint  Patient presents with   Arm Pain   Diabetes

## 2020-11-25 NOTE — Assessment & Plan Note (Signed)
Established problem that has improved. Down 11 pounds since March. Continue semaglutide and lifestyle changes

## 2020-11-25 NOTE — Assessment & Plan Note (Addendum)
New problem patient is right-handed Onset: one month ago Location: initially entire right arm, from shoulder to fingers.  Now pain entire forearm from elbow to fingers. Quality: "like someone is hitting my arm with a hammer", throbbing and aching Severity: moderate to severe, wax and want Function: has to rest arm periodically with hot water bottle on wrist/forearm dorsum.  Works Psychologist, educational.  Arm pain has interfered with being able to work efficiently with her patients.  Pattern: constant with periods of exacerbation Course: steady pain but pain from shoulder to just above elbow has resolved. Radiation: no Relief: rest and heat, increase dose of gabapentin, prn APAP Precipitant: no recalled acute injury to right shoulder or arm Associated Symptoms:       Restricted ROM/stiffness/swelling:  Wrist motion restricted due to pain, localizes finger discofort to 2,3 and 4 digits       Muscle ache/cramp/spasms: dorsal forearm ache.  No cramps/spasms       Muscle strength change: weak grip because of pain, weak wrist dorsiflexion due to pain       Change in sensation (dysesthesia/itch or numbness): No numbness, no tingling Functional Impact:           Change in work activities: Still working Psychologist, educational, and as Theatre manager.  Usually works 7 days a week. Work is painful and more effortful.   Trauma (Acute or Chronic): History of breast reduction surgery in her early 60s.  Prior Diagnostic Testing or Treatments: Previously patient had similar discomfort in her right foot that resolved.  Relevant PMH/PSH: DMT2 with neuropathy  Physical exam Right humeral arm 39 cm circ and left 36 cm circ.  Significant triceps fat bilat. Neck: Negative Spurling's test Right Shoulder Inspection reveals no obvious asymmetry. Decrease wrist extension secondary to pain No signs of impingement  No painful arc No axillary/thoracic/[place] adenopathy palpated Right Elbow Inspection reveals no obvious  asymmetry, no edema or erythema, no increased warmth Forearm circumferences equivalent active range of motion full TPP dorsum right forearm.  Elbow ulnar groove compression did not elicit paresthesias. No tenderness to extensor or flexor muscle insertion sites Forearm veins collapse with elevation  Right Wrist and Hand No obvious deformity or edema or erythema or increased warmth 2+ radial pulse Diffusely TTP dorsum hand/wrist Fingers No deformity or edema or erythema or increased warmth Full active range of motion  Strength 5/5 Shoulder abduction/adduction 5/5 Elbow Flexion/extension 4/5 wrist flexion and extension (limit due to pain) 4/5 grip  DTRs 0 bilateral biceps 0 bilateral triceps 0 bilateral brachioradialis    A/ Subacute, unilateral, regressive, aching/throbbing pain in primarily right arm dorsum muscles with pain interfering with usual activities. Some response to both APAP, gabapentin, and heat  Doubt cervicogenic or plexopathic No evidence of infection nor inflammation Possible idiopathic UE DVT given humeral arm circumference asymmetry Is it merely a myofascial pain that will improve with time.  Plan R/O RUE DVT Continue conservative management.  Ms Seguin may increase her gabapentin up to 300 mg dose up to three times a day as needed.  Advised about sedation and driving.  If not improving, will refer to [facility] or orthopedics.

## 2020-11-29 ENCOUNTER — Encounter: Payer: Self-pay | Admitting: Family Medicine

## 2020-11-29 ENCOUNTER — Ambulatory Visit (HOSPITAL_COMMUNITY)
Admission: RE | Admit: 2020-11-29 | Discharge: 2020-11-29 | Disposition: A | Discharge status: Home / Self Care | Payer: 59 | Source: Ambulatory Visit | Attending: Family Medicine | Admitting: Family Medicine

## 2020-11-29 ENCOUNTER — Other Ambulatory Visit: Payer: Self-pay

## 2020-11-29 ENCOUNTER — Ambulatory Visit (INDEPENDENT_AMBULATORY_CARE_PROVIDER_SITE_OTHER): Payer: 59 | Admitting: Family Medicine

## 2020-11-29 VITALS — BP 145/88 | HR 99 | Wt 242.2 lb

## 2020-11-29 DIAGNOSIS — R600 Localized edema: Secondary | ICD-10-CM | POA: Insufficient documentation

## 2020-11-29 DIAGNOSIS — I82621 Acute embolism and thrombosis of deep veins of right upper extremity: Secondary | ICD-10-CM | POA: Diagnosis not present

## 2020-11-29 HISTORY — DX: Acute embolism and thrombosis of deep veins of right upper extremity: I82.621

## 2020-11-29 MED ORDER — APIXABAN 5 MG PO TABS: 5.0000 mg | ORAL_TABLET | Freq: Two times a day (BID) | ORAL | 0 refills | Status: DC

## 2020-11-29 MED ORDER — HYDROCODONE-ACETAMINOPHEN 5-325 MG PO TABS: 1.0000 | ORAL_TABLET | Freq: Four times a day (QID) | ORAL | 0 refills | Status: AC | PRN

## 2020-11-29 MED ORDER — APIXABAN 5 MG PO TABS: 10.0000 mg | ORAL_TABLET | Freq: Two times a day (BID) | ORAL | 0 refills | Status: DC

## 2020-11-29 NOTE — Progress Notes (Signed)
Upper extremity venous RT study completed.  Preliminary results relayed to Jarrett Soho, RN for [place], MD. Patient instructed to return to office for evaluation.   See CV Proc for preliminary results report.   Darlin Coco, RDMS, RVT

## 2020-11-29 NOTE — Assessment & Plan Note (Signed)
Please see [facility] office visit note from 11/24/20 for details of presentation and HPI. Broad pain complaint off distal right arm persists without significant change.   RUE Veinous [place] c/w brachial vein thrombosis  Patient has been short of breath over last week or two, but no acute onset.  No chest pain.  Report of anterior right neck muscle intermittent, spontaneous brief spasms. No History of injury to neck, upper thorax, clavicles.  No history of PICC or Central venous lines.   No work with arms overhead.  No accidental hyperextension or hyperabduction of arms.  No family history of blood clots.  No hormonal therapy. Had bilateral breast reduction surgery about 30 years ago.   Physical VS reviewed Cor: RRR, no M/G, no JVD Lung: BCTA MSK: Tenderness distal right arm dorsum to mild to moderate touch.  No overt edema RUE  Right 2+ radial pulse persists when abducted to 130 degrees  A/ Primary, spontaneous right upper extremity DVT - Alicia West is current on her age/sex appropriate cancer screening. P/ - Will check screening biophysical profile and CBC w diff, along with BNP to look for right heart strain.  - Start Apixaban 10 mg bid for 7 days, then 5 mg bid for 3 months duration. eGFR ~ 55 in January. - Reviewed side effects, including bleeding - Rx Norco 5/325 I tab q6hr prn mod pain, #30, RF 0 - Red flags of acute SHOB or CP to go to ED - After discussion, will hold off on hypercoagulable labs.  Will address again at the end of the 3 months of anticoagulation therapy.  - RETURN TO CLINIC 1 week to reassess symptoms and medication tolerance.

## 2020-11-29 NOTE — Progress Notes (Signed)
Patient seen in [facility] 11/29/20. Eliquis started for 3 months

## 2020-11-29 NOTE — Progress Notes (Signed)
Alicia West is alone Sources of clinical information for visit is/are patient and 11/29/20 RUE DG Venous US. Nursing assessment for this office visit was reviewed with the patient for accuracy and revision.     Previous Report(s) Reviewed: 11/29/20 RUE DG Venous US.  Depression screen PHQ 2/9 08/13/2019  Decreased Interest 0  Down, Depressed, Hopeless 0  PHQ - 2 Score 0  Altered sleeping -  Tired, decreased energy -  Change in appetite -  Feeling bad or failure about yourself  -  Trouble concentrating -  Moving slowly or fidgety/restless -  Suicidal thoughts -  PHQ-9 Score -  Some recent data might be hidden    Fall Risk  08/13/2019 01/28/2019 04/16/2018 01/31/2018 01/07/2018  Falls in the past year? 0 0 0 0 0  Number falls in past yr: 0 0 0 - 0  Injury with Fall? - - 0 - 0  Follow up Falls evaluation completed - Falls evaluation completed - -    PHQ9 SCORE ONLY 08/13/2019 07/29/2019 01/28/2019  PHQ-9 Total Score 0 19 3    Adult vaccines due  Topic Date Due   TETANUS/TDAP  05/29/2021    Health Maintenance Due  Topic Date Due   Zoster Vaccines- Shingrix (1 of 2) Never done   Pneumococcal Vaccine 47-79 Years old (2 - PCV) 04/20/2018   COVID-19 Vaccine (3 - Mixed Product risk series) 05/25/2019   INFLUENZA VACCINE  09/05/2020      History/P.E. limitations: none  Adult vaccines due  Topic Date Due   TETANUS/TDAP  05/29/2021    Diabetes Health Maintenance Due  Topic Date Due   FOOT EXAM  02/24/2021   OPHTHALMOLOGY EXAM  05/20/2021   HEMOGLOBIN A1C  05/25/2021    Health Maintenance Due  Topic Date Due   Zoster Vaccines- Shingrix (1 of 2) Never done   Pneumococcal Vaccine 66-42 Years old (2 - PCV) 04/20/2018   COVID-19 Vaccine (3 - Mixed Product risk series) 05/25/2019   INFLUENZA VACCINE  09/05/2020     Chief Complaint  Patient presents with   DVT    CPT E&M Office Visit Time Before Visit; reviewing medical records (e.g. recent visits, labs, studies): 3  minutes During Visit (F2F time): 20 minutes After Visit (discussion with family or HCP, prescribing, ordering, referring, calling result/recommendations or documenting on same day): 10 minutes Total Visit Time: 33 minutes

## 2020-11-29 NOTE — Patient Instructions (Signed)
Dear Alicia West - As you suspected, you have a blood clot in the Brachial vein of your right arm.  This is most likely the cause of your right arm pain.   Please start taking Eliquis, a blood thinner.  YOu take one 10 mg tablet twice a day for 7 days using the pack of pill given to you today.  Once you finish these 10 mg pills, start the 5 mg Eliquis  pills you picked up from the pharmacy.  Please continue taking the Eliquis 5 mg tablets twice a day for 3 months.  This will help your body dissolve the right arm clot.    Watch for bleeding when taking this medicine.  Let our office know if you have bleeding that will not stop.    Deep Vein Thrombosis Deep vein thrombosis (DVT) is a condition in which a blood clot forms in a deep vein, such as a vein in the lower leg, thigh, pelvis, or arm. Deep veins are veins in the deep venous system. A clot is blood that has thickened into a gel or solid. This condition is serious and can be life-threatening if the clot travels to the lungs and causes a blockage (pulmonary embolism) in the arteries of the lung. A DVT can also damage veins in the leg. This can lead to long-term, or chronic, venous disease, leg pain, swelling, discoloration, and ulcers or sores (post-thrombotic syndrome). What are the causes? This condition may be caused by: A slowdown of blood flow. Damage to a vein. A condition that causes blood to clot more easily, such as certain blood-clotting disorders. What increases the risk? The following factors may make you more likely to develop this condition: Having obesity. Being older, especially older than age 84. Being inactive (sedentary lifestyle) or not moving around. This may include: Sitting or lying down for longer than 4-6 hours other than to sleep at night. Being in the hospital, having major or lengthy surgery, or having a thin, flexible tube (central line catheter) placed in a large vein. Being pregnant, giving birth, or having recently  given birth. Taking medicines that contain estrogen, such as birth control or hormone replacement therapy. Using products that contain nicotine or tobacco, especially if you use hormonal birth control. Having a history of blood clots or a blood-clotting disease, a blood vessel disease (peripheral vascular disease), or congestive heart disease. Having a history of cancer, especially if being treated with chemotherapy. What are the signs or symptoms? Symptoms of this condition include: Swelling, pain, pressure, or tenderness in an arm or a leg. An arm or a leg becoming warm, red, or discolored. A leg turning very pale. You may have a large DVT. This is rare. If the clot is in your leg, you may notice symptoms more or have worse symptoms when you stand or walk. In some cases, there are no symptoms. How is this diagnosed? This condition is diagnosed with: Your medical history and a physical exam. Tests, such as: Blood tests to check how well your blood clots. Doppler ultrasound. This is the best way to find a DVT. Venogram. Contrast dye is injected into a vein, and X-rays are taken to check for clots. How is this treated? Treatment for this condition depends on: The cause of your DVT. The size and location of your DVT, or having more than one DVT. Your risk for bleeding or developing more clots. Other medical conditions you may have. Treatment may include: Taking a blood thinner, also called  an anticoagulant, to prevent clots from forming and growing. Wearing compression stockings, if directed. Injecting medicines into the affected vein to break up the clot (catheter-directed thrombolysis). This is used only for severe DVT and only if a specialist recommends it. Specific surgical procedures, when DVT is severe or hard to treat. These may be done to: Isolate and remove your clot. Place an inferior vena cava (IVC) filter in a large vein to catch blood clots before they reach your lungs. You  may get some medical treatments for 6 months or longer. Follow these instructions at home: If you are taking blood thinners: Talk with your health care provider before you take any medicines that contain aspirin or NSAIDs, such as ibuprofen. These medicines increase your risk for dangerous bleeding. Take your medicine exactly as told, at the same time every day. Do not skip a dose. Do not take more than the prescribed dose. This is important. Ask your health care provider about foods and medicines that could change the way your blood thinner works (may interact). Avoid these foods and medicines if you are told to do so. Avoid anything that may cause bleeding or bruising. You may bleed more easily while taking blood thinners. Be very careful when using knives, scissors, or other sharp objects. Use an electric razor instead of a blade. Avoid activities that could cause injury or bruising, and follow instructions for preventing falls. Tell your health care provider if you have had any internal bleeding, bleeding ulcers, or neurologic diseases, such as strokes or cerebral aneurysms. Wear a medical alert bracelet or carry a card that lists what medicines you take. General instructions Take over-the-counter and prescription medicines only as told by your health care provider. Return to your normal activities as told by your health care provider. Ask your health care provider what activities are safe for you. If recommended, wear compression stockings as told by your health care provider. These stockings help to prevent blood clots and reduce swelling in your legs. Keep all follow-up visits as told by your health care provider. This is important. Contact a health care provider if: You miss a dose of your blood thinner. You have new or worse pain, swelling, or redness in an arm or a leg. You have worsening numbness or tingling in an arm or a leg. You have unusual bruising. Get help right away if: You  have signs or symptoms that a blood clot has moved to the lungs. These may include: Shortness of breath. Chest pain. Fast or irregular heartbeats (palpitations). Light-headedness or dizziness. Coughing up blood. You have signs or symptoms that your blood is too thin. These may include: Blood in your vomit, stool, or urine. A cut that will not stop bleeding. A menstrual period that is heavier than usual. A severe headache or confusion. These symptoms may represent a serious problem that is an emergency. Do not wait to see if the symptoms will go away. Get medical help right away. Call your local emergency services (911 in the [place] Do not drive yourself to the hospital. Summary Deep vein thrombosis (DVT) happens when a blood clot forms in a deep vein. This may occur in the lower leg, thigh, pelvis, or arm. Symptoms affect the arm or leg and can include swelling, pain, tenderness, warmth, redness, or discoloration. This condition may be treated with medicines or compression stockings. In severe cases, surgery may be done. If you are taking blood thinners, take them exactly as told. Do not skip a dose.  Do not take more than is prescribed. Get help right away if you have shortness of breath, chest pain, fast or irregular heartbeats, or blood in your vomit, urine, or stool. This information is not intended to replace advice given to you by your health care provider. Make sure you discuss any questions you have with your health care provider. Document Revised: 01/16/2019 Document Reviewed: 01/17/2019 Elsevier Patient Education  2022 [employer].

## 2020-11-29 NOTE — Progress Notes (Signed)
Asked by Dr. McDiarmid to provide education on Apixiban (Eliquis).  Patient educated on purpose, proper use and potential adverse effects of bruising and bleeding.  Following instruction patient verbalized understanding of treatment plan.   Medication Samples have been provided to the patient.  Drug name: Eliquis (apixiban)       Strength: 76m        Qty: 28  LOT: [id number] Exp.Date: 09/05/2022  Dosing instructions: Take samples 2 tablets BID for 1 week then with prescription sent in by Dr. McDiarmid take 1 table BID.   The patient has been instructed regarding the correct time, dose, and frequency of taking this medication, including desired effects and most common side effects.   PJaneann Forehand10:09 AM 11/29/2020

## 2020-11-30 LAB — CBC WITH DIFFERENTIAL/PLATELET
Basophils Absolute: 0.1 10*3/uL (ref 0.0–0.2)
Basos: 1 %
EOS (ABSOLUTE): 0.3 10*3/uL (ref 0.0–0.4)
Eos: 4 %
Hematocrit: 32 % — ABNORMAL LOW (ref 34.0–46.6)
Hemoglobin: 10.1 g/dL — ABNORMAL LOW (ref 11.1–15.9)
Immature Grans (Abs): 0 10*3/uL (ref 0.0–0.1)
Immature Granulocytes: 0 %
Lymphocytes Absolute: 1.5 10*3/uL (ref 0.7–3.1)
Lymphs: 22 %
MCH: 26.9 pg (ref 26.6–33.0)
MCHC: 31.6 g/dL (ref 31.5–35.7)
MCV: 85 fL (ref 79–97)
Monocytes Absolute: 0.4 10*3/uL (ref 0.1–0.9)
Monocytes: 6 %
Neutrophils Absolute: 4.7 10*3/uL (ref 1.4–7.0)
Neutrophils: 67 %
Platelets: 491 10*3/uL — ABNORMAL HIGH (ref 150–450)
RBC: 3.75 x10E6/uL — ABNORMAL LOW (ref 3.77–5.28)
RDW: 14.4 % (ref 11.7–15.4)
WBC: 6.9 10*3/uL (ref 3.4–10.8)

## 2020-11-30 LAB — CMP14+EGFR
ALT: 11 IU/L (ref 0–32)
AST: 13 IU/L (ref 0–40)
Albumin/Globulin Ratio: 1.2 (ref 1.2–2.2)
Albumin: 4.1 g/dL (ref 3.8–4.8)
Alkaline Phosphatase: 104 IU/L (ref 44–121)
BUN/Creatinine Ratio: 9 (ref 9–23)
BUN: 10 mg/dL (ref 6–24)
Bilirubin Total: 0.2 mg/dL (ref 0.0–1.2)
CO2: 23 mmol/L (ref 20–29)
Calcium: 10.2 mg/dL (ref 8.7–10.2)
Chloride: 106 mmol/L (ref 96–106)
Creatinine, Ser: 1.11 mg/dL — ABNORMAL HIGH (ref 0.57–1.00)
Globulin, Total: 3.3 g/dL (ref 1.5–4.5)
Glucose: 157 mg/dL — ABNORMAL HIGH (ref 70–99)
Potassium: 4.6 mmol/L (ref 3.5–5.2)
Sodium: 141 mmol/L (ref 134–144)
Total Protein: 7.4 g/dL (ref 6.0–8.5)
eGFR: 61 mL/min/{1.73_m2} (ref >59)

## 2020-11-30 LAB — BRAIN NATRIURETIC PEPTIDE: BNP: 5.9 pg/mL (ref 0.0–100.0)

## 2020-11-30 NOTE — Progress Notes (Signed)
Unremarkable CBC and Comprehensive Metabolic Panel.  Serum creatinine 1.1.

## 2020-12-08 ENCOUNTER — Other Ambulatory Visit: Payer: Self-pay

## 2020-12-08 ENCOUNTER — Encounter: Payer: Self-pay | Admitting: Family Medicine

## 2020-12-08 ENCOUNTER — Ambulatory Visit (INDEPENDENT_AMBULATORY_CARE_PROVIDER_SITE_OTHER): Payer: 59

## 2020-12-08 ENCOUNTER — Ambulatory Visit (INDEPENDENT_AMBULATORY_CARE_PROVIDER_SITE_OTHER): Payer: 59 | Admitting: Family Medicine

## 2020-12-08 VITALS — BP 126/78 | HR 97 | Ht 69.0 in | Wt 244.0 lb

## 2020-12-08 DIAGNOSIS — I82621 Acute embolism and thrombosis of deep veins of right upper extremity: Secondary | ICD-10-CM

## 2020-12-08 DIAGNOSIS — D500 Iron deficiency anemia secondary to blood loss (chronic): Secondary | ICD-10-CM

## 2020-12-08 DIAGNOSIS — D75839 Thrombocytosis, unspecified: Secondary | ICD-10-CM

## 2020-12-08 DIAGNOSIS — Z23 Encounter for immunization: Secondary | ICD-10-CM

## 2020-12-08 NOTE — Patient Instructions (Addendum)
Since your increased platelets have been going on before this blood clot and this blood clot is unusual (in your arm), it is a good idea to see the Hematology specialists to see if the two problems are related to each other.    The Hematologist will be able to advise you about genetic testing for having blood that clots more easily than others.    Use a saline nasal spray several times throughout the day to moisturize the lining of your nose.  This can help prevent nose bleeds.   We are checking your hemoglobin, and platelets, iron.    Commonly checked genetic conditions that casue blood clots Activated Protein C resistance/Factor V Leiden ?Prothrombin gene mutation - The prothrombin 20210A gene mutation I\ ?Proteins C and S -  ?Antithrombin -  Antiphospholipids  Nosebleed, Adult A nosebleed is when blood comes out of the nose. Nosebleeds are common. Usually, they are not a sign of a serious condition. Nosebleeds can happen if a blood vessel in your nose starts to bleed or if the lining of your nose (mucous membrane) cracks. They are commonly caused by: Allergies. Colds. Picking your nose. Blowing your nose too hard. An injury from sticking an object into your nose or getting hit in the nose. Dry or cold air. Less common causes of nosebleeds include: Toxic fumes. Something abnormal in the nose or in the air-filled spaces in the bones of the face (sinuses). Growths in the nose, such as polyps. Blood thinners or conditions that cause blood to clot slowly. Certain illnesses or procedures that irritate or dry out the nasal passages. Follow these instructions at home: When you have a nosebleed:  Sit down and tilt your head slightly forward. Use a clean towel or tissue to pinch your nostrils under the bony part of your nose. After 5 minutes, let go of your nose and see if bleeding starts again. Do not release pressure before that time. If there is still bleeding, repeat the pinching and  holding for 5 minutes or until the bleeding stops. Do not place tissues or gauze in the nose to stop the bleeding. Avoid lying down and avoid tilting your head backward. That may make blood collect in the throat and cause gagging or coughing. Use a nasal spray decongestant to help with a nosebleed as told by your health care provider. After a nosebleed: Avoid blowing your nose or sniffing for a number of hours. Avoid straining, lifting, or bending at the waist for several days. You may go back to other normal activities as you are able. If you are taking aspirin or blood thinners and you have nosebleeds, talk to your health care provider. These medicines make bleeding more likely. Ask your health care provider if you should stop taking the medicines or if you should adjust the dose. Do not stop taking medicines that your health care provider has recommended unless he or she tells you to stop taking them. If your nosebleed was caused by dry mucous membranes, use over-the-counter saline nasal spray or gel and a humidifier as told by your health care provider. This will keep the mucous membranes moist and allow them to heal. If you need to use one of these products: Choose one that is water-soluble. Use only as much as you need and use it only as often as needed. Do not lie down right after you use it. If you get nosebleeds often, talk with your health care provider about medical treatments. Options may include: Nasal  cautery. This treatment stops and prevents nosebleeds by using a chemical swab or electrical device to lightly burn tiny blood vessels inside the nose. Nasal packing. A gauze or other material is placed in the nose to keep constant pressure on the bleeding area. Contact a health care provider if you: Have a fever. Get nosebleeds often or more often than usual. Bruise very easily. Have a nosebleed from having something stuck in your nose. Have bleeding in your mouth. Vomit or cough  up brown material. Have a nosebleed after you start a new medicine. Get help right away if: You have a nosebleed after a fall or a head injury. Your nosebleed does not go away after 20 minutes. You feel dizzy or weak. You have unusual bleeding from other parts of your body. You have unusual bruising on other parts of your body. You become sweaty. You vomit blood. Summary A nosebleed is when blood comes out of the nose. Common causes include allergies, an injury to the nose, or cold or dry air. Initial treatment includes applying pressure for 5 minutes. Moisturizing the nose with saline nasal spray or gel after a nosebleed may help prevent future bleeding. Get help right away if your nosebleed does not go away after 20 minutes. This information is not intended to replace advice given to you by your health care provider. Make sure you discuss any questions you have with your health care provider. Document Revised: 11/20/2018 Document Reviewed: 11/20/2018 Elsevier Patient Education  2022 [employer].

## 2020-12-08 NOTE — Progress Notes (Addendum)
Time 30 minute visit CPT E&M Office Visit Time Before Visit; reviewing medical records (e.g. recent visits, labs, studies): 7 minutes During Visit (F2F time): 16 minutes After Visit (discussion with family or HCP, prescribing, ordering, referring, calling result/recommendations or documenting on same day): 7 minutes Total Visit Time: 30 minutes

## 2020-12-09 ENCOUNTER — Telehealth: Payer: Self-pay | Admitting: Family Medicine

## 2020-12-09 ENCOUNTER — Telehealth: Payer: Self-pay | Admitting: Hematology and Oncology

## 2020-12-09 DIAGNOSIS — D75839 Thrombocytosis, unspecified: Secondary | ICD-10-CM

## 2020-12-09 DIAGNOSIS — D509 Iron deficiency anemia, unspecified: Secondary | ICD-10-CM

## 2020-12-09 NOTE — Assessment & Plan Note (Signed)
CBC Latest Ref Rng & Units 12/08/2020 11/29/2020 02/25/2020  WBC 3.4 - 10.8 x10E3/uL 7.8 6.9 7.7  Hemoglobin 11.1 - 15.9 g/dL 9.9(L) 10.1(L) 11.3  Hematocrit 34.0 - 46.6 % 30.2(L) 32.0(L) 34.9  Platelets 150 - 450 x10E3/uL 405 491(H) 417   History of IDA treated with one time iron IV infusion back in 2000s.  Taking Prenatal vitamin with iron as could not tolerate other iron supplement formulations.   Await ferritin results

## 2020-12-09 NOTE — Telephone Encounter (Signed)
Scheduled app tper 11/3 referral. Pt is an established pt of Dr. Worthy Flank however when I spoke to her she requested to see Dr. Lindi Adie instead. I scheduled a new hem appt with Dr. Lindi Adie and sent a staff msg to the MD's letting them know of pt's request.

## 2020-12-09 NOTE — Addendum Note (Signed)
Addended byWendy Poet, Daylah Sayavong D on: 12/09/2020 09:08 AM   Modules accepted: Orders

## 2020-12-09 NOTE — Assessment & Plan Note (Addendum)
Established problem Began Apixaban last week with load 10 mg twice a day (office samples)- now complete Now on Apixaban 5 mg twice a day  Brief episode of left nares bleed self-controlled. No blood in stool.  No gross hematuria.   Visible venous pattern on noncartilaginous septum in left nares without stigmata of bleed.   A/ Spontaneous right upper extremity brachial vein DVT     - Checking Ferritin (in setting of acute inflammatory process, DVT)  P/ Given the unusual DVT location without clear precipitant and the patient's history of frequent mild thrombocytosis on CBCs since at least 2008, it feels prudent to request hematology consultation to see if some myeloproliferative process is possible.   Certainly her recent increased platelets could likely be reactive to the DVT or evidence of IDA.   We would invite Hematology's opinion on work-up for heredity hypercoagulability in patient. Will continue current plan for anticoagulation with DOAC for total 3 months.  End 03/03/2021

## 2020-12-10 LAB — CBC WITH DIFFERENTIAL/PLATELET
Basophils Absolute: 0.1 10*3/uL (ref 0.0–0.2)
Basos: 1 %
EOS (ABSOLUTE): 0.3 10*3/uL (ref 0.0–0.4)
Eos: 4 %
Hemoglobin: 9.9 g/dL — ABNORMAL LOW (ref 11.1–15.9)
Immature Grans (Abs): 0 10*3/uL (ref 0.0–0.1)
Immature Granulocytes: 1 %
Lymphocytes Absolute: 1.2 10*3/uL (ref 0.7–3.1)
Lymphs: 15 %
MCH: 28 pg (ref 26.6–33.0)
MCHC: 32.8 g/dL (ref 31.5–35.7)
MCV: 85 fL (ref 79–97)
Monocytes Absolute: 0.5 10*3/uL (ref 0.1–0.9)
Monocytes: 7 %
Neutrophils Absolute: 5.8 10*3/uL (ref 1.4–7.0)
Neutrophils: 72 %
Platelets: 405 10*3/uL (ref 150–450)
RBC: 3.54 x10E6/uL — ABNORMAL LOW (ref 3.77–5.28)
RDW: 14.2 % (ref 11.7–15.4)
WBC: 7.8 10*3/uL (ref 3.4–10.8)

## 2020-12-10 LAB — ANEMIA PANEL
Ferritin: 20 ng/mL (ref 15–150)
Folate, Hemolysate: 376 ng/mL
Folate, RBC: 1245 ng/mL (ref >498)
Hematocrit: 30.2 % — ABNORMAL LOW (ref 34.0–46.6)
Iron Saturation: 9 % — CL (ref 15–55)
Iron: 31 ug/dL (ref 27–159)
Retic Ct Pct: 2 % (ref 0.6–2.6)
Total Iron Binding Capacity: 342 ug/dL (ref 250–450)
UIBC: 311 ug/dL (ref 131–425)
Vitamin B-12: 836 pg/mL (ref 232–1245)

## 2020-12-12 MED ORDER — COMPLETENATE 29-1 MG PO CHEW: 1.0000 | CHEWABLE_TABLET | Freq: Every day | ORAL | 3 refills | Status: DC

## 2020-12-12 NOTE — Telephone Encounter (Signed)
I spoke with Alicia West about her anemia studies from 11/3.  While her Ferritin is within normal range, it is on the lower end, so I suggest she start iron supplementation.    She would like to use the prenatal vitamin chewable that was sent in to pharmacy last year but she was unable to afford.   She requests that the same Rx be sent in again to see if her insurance will now cover it.    If they won't, then there are OTC chewable iron supplement available like [employer] gummies.    Alicia West has an initial consultation with Hematology 11/18 to address her DVT in an unusual location and history of recurrent thrombocytosis since at least 2008.

## 2020-12-14 ENCOUNTER — Other Ambulatory Visit: Payer: Self-pay | Admitting: Family Medicine

## 2020-12-14 ENCOUNTER — Other Ambulatory Visit: Payer: Self-pay | Admitting: Gastroenterology

## 2020-12-14 DIAGNOSIS — E1165 Type 2 diabetes mellitus with hyperglycemia: Secondary | ICD-10-CM

## 2020-12-22 NOTE — Progress Notes (Signed)
[place] CONSULT NOTE  Patient Care Team: McDiarmid, Blane Ohara, MD as PCP - General (Family Medicine) Bernarda Caffey, MD as Consulting Physician (Ophthalmology) Dohmeier, Asencion Partridge, MD as Consulting Physician (Neurology)  CHIEF COMPLAINTS/PURPOSE OF CONSULTATION:  Newly diagnosed acute DVT of brachial vein of right upper extremity and thrombocytosis  HISTORY OF PRESENTING ILLNESS:  Alicia West 50 y.o. female is here because of recent diagnosis of  DVT of brachial vein of right upper extremity and thrombocytosis. She presents to the clinic today for initial evaluation and discussion of treatment options.  She developed a pain in the right arm for several months and ultimately got an ultrasound of the arm which revealed a deep vein thrombosis in the right brachial vein.  She was referred to [place] for evaluation and treatment.  She was started on Eliquis and appears to be tolerating it well.  She has been on Eliquis for at least a month and she thinks that the pain is improving.  She vaguely remembers having an iron infusion and having reaction to it and therefore they stopped it and subsequently she has had some pain in the arm.  I reviewed her records extensively and collaborated the history with the patient.  MEDICAL HISTORY:  Past Medical History:  Diagnosis Date   Allergy    seasonal sinus allergies   ANEMIA, IRON DEFICIENCY, UNSPEC. 04/04/2006   Qualifier: Diagnosis of  By: Samara Snide     Bilateral senile cataracts 11/20/2018   Mixed OU   Binge-eating disorder, moderate 03/17/2013   Breast lump 07/22/2009   Qualifier: Diagnosis of  By: Netty Starring  MD, Lucianne Muss     CKD stage 3 secondary to diabetes ([place]) 10/17/2018   DEPRESSIVE DISORDER, NOS 04/04/2006   Qualifier: Diagnosis of  By: Samara Snide     Diabetes mellitus    on meds   Diabetic macular edema ([place])    by patient report 10/27/2019   Diabetic neuropathy ([place]) 10/17/2018   Diabetic peripheral neuropathy ([place])     per patient   Diabetic visual loss: severe vision impairment of both eyes, with macular edema, with severe nonproliferative retinopathy, associated with type 2 diabetes mellitus ([place]) 44/96/7591       Folliculitis 6/38/4665   Heavy menses    Pt reports she was to get a hysterectomy   Herpes labialis 09/06/2010   Herpes simplex labialis    History of HPV infection 08/18/2019   Hypercalcemia 08/13/2019   Hyperlipidemia    diet controlled/not taking meds for this   Hypertension    on meds   Hypertensive retinopathy    OU   Iron deficiency anemia 04/04/2006   Qualifier: Diagnosis of  By: Samara Snide     Loud snoring 10/06/2019   Menorrhagia 04/04/2006   Qualifier: Diagnosis of  By: Samara Snide     Morbid obesity ([place]) 05/01/2013   Neuromuscular disorder ([place])    Obstructive sleep apnea 11/27/2019   Osteoporosis    bilateral hips R>L   Paroxysmal supraventricular tachycardia ([place]) 10/26/2014   PONV (postoperative nausea and vomiting)    POSITIVE PPD 10/18/2009   Qualifier: Diagnosis of  By: Zebedee Iba NP, Manuela Schwartz     Prurigo nodularis 10/17/2018   RHINITIS, ALLERGIC 04/04/2006   Qualifier: Diagnosis of  By: Samara Snide     Right arm pain 11/25/2020   Sleep apnea    Sleep related choking sensation 10/06/2019   TB (tuberculosis) contact    + skin test,/- chest x-ray - in mid 2000's  Thrombocytosis 08/18/2019   Tuberculosis    postitive 2011-[facility] per pt   Tubular adenoma of colon 2021   2 mm sessile, Rikki Spearing MD ([facility])   Visual impairment of left eye 08/18/2019    SURGICAL HISTORY: Past Surgical History:  Procedure Laterality Date   REDUCTION MAMMAPLASTY     TONSILLECTOMY     TONSILLECTOMY     WISDOM TOOTH EXTRACTION      SOCIAL HISTORY: Social History   Socioeconomic History   Marital status: Married    Spouse name: Not on file   Number of children: Not on file   Years of education: Not on file   Highest education level: Not on file  Occupational  History   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   Occupation: CNA    Comment: Private duty  Tobacco Use   Smoking status: Never   Smokeless tobacco: Never  Vaping Use   Vaping Use: Never used  Substance and Sexual Activity   Alcohol use: Yes    Comment: Occas   Drug use: No   Sexual activity: Yes    Birth control/protection: None    Comment: 1st intercourse 50 yo-More than 5 partners  Other Topics Concern   Not on file  Social History Narrative   Not on file   Social Determinants of Health   Financial Resource Strain: Not on file  Food Insecurity: Not on file  Transportation Needs: Not on file  Physical Activity: Not on file  Stress: Not on file  Social Connections: Not on file  Intimate Partner Violence: Not on file    FAMILY HISTORY: Family History  Problem Relation Age of Onset   Hypertension Mother    Diabetes Mellitus II Mother    Congestive Heart Failure Mother        Deceased from complications of CHF   Hypertension Father    Diabetes Mellitus II Father    Colon polyps Father 39   Colon cancer Father 59   Diabetes Mellitus II Brother    Colon polyps Brother 11   Colon polyps Maternal Uncle 62   Colon cancer Maternal Uncle 62   Esophageal cancer Neg Hx    Stomach cancer Neg Hx    Rectal cancer Neg Hx     ALLERGIES:  is allergic to ace inhibitors, lipitor [atorvastatin], norvasc [amlodipine], and penicillins.  MEDICATIONS:  Current Outpatient Medications  Medication Sig Dispense Refill   acetaminophen (TYLENOL) 500 MG tablet Take 500 mg by mouth every 6 (six) hours as needed.     apixaban (ELIQUIS) 5 MG TABS tablet Take 1 tablet (5 mg total) by mouth 2 (two) times daily. 180 tablet 0   atorvastatin (LIPITOR) 40 MG tablet Take 1 tablet (40 mg total) by mouth 2 (two) times a week. 24 tablet 3   carvedilol (COREG) 25 MG tablet TAKE ONE TABLET BY MOUTH TWICE A DAY WITH MEALS 180 tablet 3   Clindamycin-Benzoyl Per, Refr, gel Apply to face  each morning     gabapentin (NEURONTIN) 100 MG capsule Take 3 capsules (300 mg total) by mouth at bedtime. (Patient taking differently: Take 300 mg by mouth at bedtime. Taking 200 mg occasionally for right arm pain) 90 capsule 3   glucose blood (BAYER CONTOUR NEXT TEST) test strip Use to check blood sugar three times a day or as directed. 100 each 12   Insulin Glargine (BASAGLAR KWIKPEN) 100 UNIT/ML INJECT 34 UNITS UNDER THE SKIN DAILY 15  mL 3   Insulin Pen Needle (TECHLITE PEN NEEDLES) 32G X 6 MM MISC Use to inject insulin daily as instructed 100 each 5   Lancets MISC Use Contour Next Lancet to check blood glucose 3 times a day.  Dx code E11.65. (Patient taking differently: Use Contour Next Lancet to check blood glucose 3 times a day.  Dx code E11.65.) 100 each 12   LINZESS 145 MCG CAPS capsule TAKE ONE CAPSULE BY MOUTH DAILY 30 capsule 5   losartan-hydrochlorothiazide (HYZAAR) 100-25 MG tablet TAKE ONE TABLET BY MOUTH DAILY 90 tablet 3   metFORMIN (GLUCOPHAGE) 500 MG tablet TAKE ONE TABLET BY MOUTH TWICE A DAY WITH A MEAL 180 tablet 3   omeprazole (PRILOSEC) 20 MG capsule Take 1 capsule (20 mg total) by mouth daily. 30 capsule 11   OZEMPIC, 1 MG/DOSE, 4 MG/3ML SOPN DIAL AND INJECT UNDER THE SKIN 1 MG WEEKLY 3 mL 5   prenatal vitamin w/FE, FA (NATACHEW) 29-1 MG CHEW chewable tablet Chew 1 tablet by mouth daily at 12 noon. 90 tablet 3   spironolactone (ALDACTONE) 25 MG tablet TAKE ONE TABLET BY MOUTH DAILY 90 tablet 3   Syringe, Disposable, 1 ML MISC 1 each by Does not apply route 3 (three) times daily. 100 each 0   tretinoin (RETIN-A) 0.025 % cream Apply to face and back nightly to tolerance.     No current facility-administered medications for this visit.    REVIEW OF SYSTEMS:   Constitutional: Denies fevers, chills or abnormal night sweats All other systems were reviewed with the patient and are negative.  PHYSICAL EXAMINATION: ECOG PERFORMANCE STATUS: 1 - Symptomatic but completely  ambulatory  Vitals:   12/23/20 0818  BP: 125/76  Pulse: 98  Resp: 16  Temp: 97.9 F (36.6 C)  SpO2: 100%   Filed Weights   12/23/20 0818  Weight: 235 lb 8 oz (106.8 kg)      LABORATORY DATA:  I have reviewed the data as listed Lab Results  Component Value Date   WBC 7.8 12/08/2020   HGB 9.9 (L) 12/08/2020   HCT 30.2 (L) 12/08/2020   MCV 85 12/08/2020   PLT 405 12/08/2020   Lab Results  Component Value Date   NA 141 11/29/2020   K 4.6 11/29/2020   CL 106 11/29/2020   CO2 23 11/29/2020    RADIOGRAPHIC STUDIES: I have personally reviewed the radiological reports and agreed with the findings in the report.  ASSESSMENT AND PLAN:  Thrombocytosis Lab review: 12/08/2019: Hemoglobin 10.9, MCV 82.5, platelets 550 12/08/2020: Hemoglobin 9.9, MCV 85, platelets 405 Mild thrombocytosis which resolved spontaneously is most likely an acute phase reactant.  It could be related to underlying inflammation or underlying iron deficiency. No additional work-up for the thrombocytosis is necessary at these levels.  History of anemia due to chronic kidney disease Patient has chronic kidney disease stage III Normocytic anemia: Additional lab work will be needed to confirm the etiology. Patient has recurrent heavy menstrual bleeding which is also contributing to her anemia.  She is contemplating on getting a hysterectomy done.  She has had this problem for years.  1.  Serum protein electrophoresis 2. hemolysis work-up 3.  Reticulocyte index  Since the hemoglobin is at 10 g, she can be watched without erythropoietin stimulating therapy at this time.  Arm DVT (deep venous thromboembolism), chronic, right (HCC) Unclear etiology Vascular ultrasound 11/29/2020: Brachial vein indeterminate DVT Since this is an unusual DVT, hypercoagulability work-up is reasonable. Return to clinic  to discuss results of labs in 2 weeks   All questions were answered. The patient knows to call the clinic  with any problems, questions or concerns.   Rulon Eisenmenger, MD, MPH 12/23/2020    I, Thana Ates, am acting as scribe for Nicholas Lose, MD.  I have reviewed the above documentation for accuracy and completeness, and I agree with the above.

## 2020-12-23 ENCOUNTER — Inpatient Hospital Stay: Payer: 59

## 2020-12-23 ENCOUNTER — Other Ambulatory Visit: Payer: Self-pay

## 2020-12-23 ENCOUNTER — Inpatient Hospital Stay: Payer: 59 | Attending: Hematology and Oncology | Admitting: Hematology and Oncology

## 2020-12-23 DIAGNOSIS — Z862 Personal history of diseases of the blood and blood-forming organs and certain disorders involving the immune mechanism: Secondary | ICD-10-CM

## 2020-12-23 DIAGNOSIS — Z79899 Other long term (current) drug therapy: Secondary | ICD-10-CM | POA: Diagnosis not present

## 2020-12-23 DIAGNOSIS — N189 Chronic kidney disease, unspecified: Secondary | ICD-10-CM | POA: Diagnosis not present

## 2020-12-23 DIAGNOSIS — E1122 Type 2 diabetes mellitus with diabetic chronic kidney disease: Secondary | ICD-10-CM | POA: Diagnosis not present

## 2020-12-23 DIAGNOSIS — D75839 Thrombocytosis, unspecified: Secondary | ICD-10-CM

## 2020-12-23 DIAGNOSIS — I129 Hypertensive chronic kidney disease with stage 1 through stage 4 chronic kidney disease, or unspecified chronic kidney disease: Secondary | ICD-10-CM | POA: Diagnosis not present

## 2020-12-23 DIAGNOSIS — I82721 Chronic embolism and thrombosis of deep veins of right upper extremity: Secondary | ICD-10-CM

## 2020-12-23 DIAGNOSIS — D649 Anemia, unspecified: Secondary | ICD-10-CM | POA: Diagnosis not present

## 2020-12-23 DIAGNOSIS — D509 Iron deficiency anemia, unspecified: Secondary | ICD-10-CM

## 2020-12-23 DIAGNOSIS — N183 Chronic kidney disease, stage 3 unspecified: Secondary | ICD-10-CM | POA: Insufficient documentation

## 2020-12-23 HISTORY — DX: Chronic kidney disease, unspecified: N18.9

## 2020-12-23 HISTORY — DX: Personal history of diseases of the blood and blood-forming organs and certain disorders involving the immune mechanism: Z86.2

## 2020-12-23 HISTORY — DX: Chronic embolism and thrombosis of deep veins of right upper extremity: I82.721

## 2020-12-23 LAB — IRON AND TIBC
Iron: 32 ug/dL — ABNORMAL LOW (ref 41–142)
Saturation Ratios: 8 % — ABNORMAL LOW (ref 21–57)
TIBC: 381 ug/dL (ref 236–444)
UIBC: 349 ug/dL (ref 120–384)

## 2020-12-23 LAB — RETIC PANEL
Immature Retic Fract: 15.2 % (ref 2.3–15.9)
RBC.: 3.74 MIL/uL — ABNORMAL LOW (ref 3.87–5.11)
Retic Count, Absolute: 71.8 10*3/uL (ref 19.0–186.0)
Retic Ct Pct: 1.9 % (ref 0.4–3.1)
Reticulocyte Hemoglobin: 31 pg (ref >27.9)

## 2020-12-23 LAB — CBC WITH DIFFERENTIAL (CANCER CENTER ONLY)
Abs Immature Granulocytes: 0.02 10*3/uL (ref 0.00–0.07)
Basophils Absolute: 0.1 10*3/uL (ref 0.0–0.1)
Basophils Relative: 1 %
Eosinophils Absolute: 0.3 10*3/uL (ref 0.0–0.5)
Eosinophils Relative: 5 %
HCT: 31.7 % — ABNORMAL LOW (ref 36.0–46.0)
Hemoglobin: 10.3 g/dL — ABNORMAL LOW (ref 12.0–15.0)
Immature Granulocytes: 0 %
Lymphocytes Relative: 19 %
Lymphs Abs: 1.2 10*3/uL (ref 0.7–4.0)
MCH: 27.8 pg (ref 26.0–34.0)
MCHC: 32.5 g/dL (ref 30.0–36.0)
MCV: 85.4 fL (ref 80.0–100.0)
Monocytes Absolute: 0.4 10*3/uL (ref 0.1–1.0)
Monocytes Relative: 7 %
Neutro Abs: 4.2 10*3/uL (ref 1.7–7.7)
Neutrophils Relative %: 68 %
Platelet Count: 409 10*3/uL — ABNORMAL HIGH (ref 150–400)
RBC: 3.71 MIL/uL — ABNORMAL LOW (ref 3.87–5.11)
RDW: 14.5 % (ref 11.5–15.5)
WBC Count: 6.2 10*3/uL (ref 4.0–10.5)
nRBC: 0 % (ref 0.0–0.2)

## 2020-12-23 LAB — FERRITIN: Ferritin: 31 ng/mL (ref 11–307)

## 2020-12-23 LAB — LACTATE DEHYDROGENASE: LDH: 125 U/L (ref 98–192)

## 2020-12-23 NOTE — Assessment & Plan Note (Signed)
Lab review: 12/08/2019: Hemoglobin 10.9, MCV 82.5, platelets 550 12/08/2020: Hemoglobin 9.9, MCV 85, platelets 405 Mild thrombocytosis which resolved spontaneously is most likely an acute phase reactant.  It could be related to underlying inflammation or underlying iron deficiency. No additional work-up for the thrombocytosis is necessary at these levels.

## 2020-12-23 NOTE — Assessment & Plan Note (Signed)
Patient has chronic kidney disease stage III Normocytic anemia: Additional lab work will be needed to confirm the etiology. 1.  Serum protein electrophoresis 2. hemolysis work-up 3.  TSH 4.  Reticulocyte index  Since the hemoglobin is at 10 g, she can be watched without erythropoietin stimulating therapy at this time.

## 2020-12-23 NOTE — Assessment & Plan Note (Signed)
Unclear etiology Vascular ultrasound 11/29/2020: Brachial vein indeterminate DVT Since this is an unusual DVT, hypercoagulability work-up is reasonable. Return to clinic to discuss results of labs in 2 weeks

## 2020-12-24 LAB — LUPUS ANTICOAGULANT PANEL
DRVVT: 78.9 s — ABNORMAL HIGH (ref 0.0–47.0)
PTT Lupus Anticoagulant: 37.6 s (ref 0.0–51.9)

## 2020-12-24 LAB — DRVVT CONFIRM: dRVVT Confirm: 1.1 ratio (ref 0.8–1.2)

## 2020-12-24 LAB — ANTITHROMBIN III ANTIGEN: AT III AG PPP IMM-ACNC: 99 % (ref 72–124)

## 2020-12-24 LAB — PROTEIN S, TOTAL: Protein S Ag, Total: 182 % — ABNORMAL HIGH (ref 60–150)

## 2020-12-24 LAB — PROTEIN C, TOTAL: Protein C, Total: 129 % (ref 60–150)

## 2020-12-24 LAB — DRVVT MIX: dRVVT Mix: 59.3 s — ABNORMAL HIGH (ref 0.0–40.4)

## 2020-12-25 LAB — ERYTHROPOIETIN: Erythropoietin: 14.6 m[IU]/mL (ref 2.6–18.5)

## 2020-12-27 LAB — MULTIPLE MYELOMA PANEL, SERUM
Albumin SerPl Elph-Mcnc: 3.5 g/dL (ref 2.9–4.4)
Albumin/Glob SerPl: 0.9 (ref 0.7–1.7)
Alpha 1: 0.2 g/dL (ref 0.0–0.4)
Alpha2 Glob SerPl Elph-Mcnc: 1 g/dL (ref 0.4–1.0)
B-Globulin SerPl Elph-Mcnc: 1.3 g/dL (ref 0.7–1.3)
Gamma Glob SerPl Elph-Mcnc: 1.7 g/dL (ref 0.4–1.8)
Globulin, Total: 4.2 g/dL — ABNORMAL HIGH (ref 2.2–3.9)
IgA: 295 mg/dL (ref 87–352)
IgG (Immunoglobin G), Serum: 1551 mg/dL (ref 586–1602)
IgM (Immunoglobulin M), Srm: 93 mg/dL (ref 26–217)
Total Protein ELP: 7.7 g/dL (ref 6.0–8.5)

## 2020-12-28 ENCOUNTER — Telehealth: Payer: Self-pay | Admitting: Hematology and Oncology

## 2020-12-28 NOTE — Telephone Encounter (Signed)
Rescheduled appointment per scheduling message. Left message.

## 2020-12-30 LAB — PROTHROMBIN GENE MUTATION

## 2021-01-01 ENCOUNTER — Other Ambulatory Visit: Payer: Self-pay | Admitting: Family Medicine

## 2021-01-02 LAB — FACTOR 5 LEIDEN

## 2021-01-08 ENCOUNTER — Other Ambulatory Visit: Payer: Self-pay | Admitting: Family Medicine

## 2021-01-09 ENCOUNTER — Other Ambulatory Visit: Payer: Self-pay

## 2021-01-09 DIAGNOSIS — E1165 Type 2 diabetes mellitus with hyperglycemia: Secondary | ICD-10-CM

## 2021-01-09 DIAGNOSIS — E1169 Type 2 diabetes mellitus with other specified complication: Secondary | ICD-10-CM

## 2021-01-09 DIAGNOSIS — E7800 Pure hypercholesterolemia, unspecified: Secondary | ICD-10-CM

## 2021-01-09 MED ORDER — BASAGLAR KWIKPEN 100 UNIT/ML ~~LOC~~ SOPN: 34.0000 [IU] | PEN_INJECTOR | Freq: Every day | SUBCUTANEOUS | 3 refills | Status: DC

## 2021-01-19 ENCOUNTER — Telehealth: Payer: 59 | Admitting: Hematology and Oncology

## 2021-01-20 ENCOUNTER — Other Ambulatory Visit: Payer: Self-pay | Admitting: Internal Medicine

## 2021-01-20 DIAGNOSIS — K253 Acute gastric ulcer without hemorrhage or perforation: Secondary | ICD-10-CM

## 2021-01-20 DIAGNOSIS — K219 Gastro-esophageal reflux disease without esophagitis: Secondary | ICD-10-CM

## 2021-02-08 NOTE — Progress Notes (Signed)
°  HEMATOLOGY-ONCOLOGY TELEPHONE VISIT PROGRESS NOTE  I connected with Alicia West on 02/09/2021 at  8:15 AM EST by telephone and verified that I am speaking with the correct person using two identifiers.  I discussed the limitations, risks, security and privacy concerns of performing an evaluation and management service by telephone and the availability of in person appointments.  I also discussed with the patient that there may be a patient responsible charge related to this service. The patient expressed understanding and agreed to proceed.   History of Present Illness: Alicia West is a 51 y.o. female with above-mentioned history of  DVT of brachial vein of right upper extremity and thrombocytosis. She presents via telephone today for follow-up.   Observations/Objective:     Assessment Plan:  Thrombocytosis Lab review: 12/08/2019: Hemoglobin 10.9, MCV 82.5, platelets 550 12/08/2020: Hemoglobin 9.9, MCV 85, platelets 405 12/23/2020: Hemoglobin 10.3, MCV 85, platelets 409 Mild thrombocytosis which could be related to mild iron deficiency versus being an acute phase reactant  History of anemia due to chronic kidney disease Lab review: 12/23/2020: Hemoglobin 10.3, MCV 85.4, iron saturation 8%, ferritin 31, reticulocyte's 2% TIBC 342, B12 836, folate 376 Erythropoietin: 14.6, SPEP: No M protein, LDH 125   Anemia likely to be due to anemia due to chronic kidney disease stage III accompanied by mild iron deficiency. Hemoglobin is about 10 and therefore there is no role of erythropoietin stimulating agent at this time. If the hemoglobin drops below 10, patient would be a candidate for erythropoietin stimulating agent therapy because the serum erythropoietin levels are fairly low for the degree of anemia.  Arm DVT (deep venous thromboembolism), chronic, right ([place]) Vascular ultrasound 11/29/2020: Brachial vein indeterminate DVT Hypercoagulability work-up: Negative for lupus  anticoagulant, Antithrombin III: 99%, protein C: 129%, protein S: 182%, prothrombin gene mutation: Not detected, factor V Leiden: Not detected 6 months of anticoagulation recommended If patient has further blood clots and lifelong anticoagulation will be needed  3 months lab work and telephone visit day after to discuss results  I discussed the assessment and treatment plan with the patient. The patient was provided an opportunity to ask questions and all were answered. The patient agreed with the plan and demonstrated an understanding of the instructions. The patient was advised to call back or seek an in-person evaluation if the symptoms worsen or if the condition fails to improve as anticipated.   Total time spent: 12 mins including non-face to face time and time spent for planning, charting and coordination of care  Rulon Eisenmenger, MD 02/09/2021    I, Thana Ates, am acting as scribe for Nicholas Lose, MD.  I have reviewed the above documentation for accuracy and completeness, and I agree with the above.

## 2021-02-09 ENCOUNTER — Inpatient Hospital Stay: Payer: 59 | Attending: Hematology and Oncology | Admitting: Hematology and Oncology

## 2021-02-09 DIAGNOSIS — N189 Chronic kidney disease, unspecified: Secondary | ICD-10-CM | POA: Diagnosis not present

## 2021-02-09 DIAGNOSIS — Z862 Personal history of diseases of the blood and blood-forming organs and certain disorders involving the immune mechanism: Secondary | ICD-10-CM

## 2021-02-09 DIAGNOSIS — D75839 Thrombocytosis, unspecified: Secondary | ICD-10-CM | POA: Diagnosis not present

## 2021-02-09 DIAGNOSIS — I82721 Chronic embolism and thrombosis of deep veins of right upper extremity: Secondary | ICD-10-CM | POA: Diagnosis not present

## 2021-02-09 DIAGNOSIS — D631 Anemia in chronic kidney disease: Secondary | ICD-10-CM | POA: Insufficient documentation

## 2021-02-09 DIAGNOSIS — N183 Chronic kidney disease, stage 3 unspecified: Secondary | ICD-10-CM | POA: Insufficient documentation

## 2021-02-09 HISTORY — DX: Chronic kidney disease, stage 3 unspecified: N18.30

## 2021-02-09 NOTE — Assessment & Plan Note (Addendum)
Lab review: 12/23/2020: Hemoglobin 10.3, MCV 85.4, iron saturation 8%, ferritin 31, reticulocyte's 2% TIBC 342, B12 836, folate 376 Erythropoietin: 14.6, SPEP: No M protein, LDH 125   Anemia likely to be due to anemia due to chronic kidney disease stage III accompanied by mild iron deficiency. Hemoglobin is about 10 and therefore there is no role of erythropoietin stimulating agent at this time. If the hemoglobin drops below 10, patient would be a candidate for erythropoietin stimulating agent therapy because the serum erythropoietin levels are fairly low for the degree of anemia.

## 2021-02-09 NOTE — Assessment & Plan Note (Signed)
Vascular ultrasound 11/29/2020: Brachial vein indeterminate DVT Hypercoagulability work-up: Negative for lupus anticoagulant, Antithrombin III: 99%, protein C: 129%, protein S: 182%, prothrombin gene mutation: Not detected, factor V Leiden: Not detected 6 months of anticoagulation recommended If patient has further blood clots and lifelong anticoagulation will be needed

## 2021-02-09 NOTE — Assessment & Plan Note (Signed)
Lab review: 12/08/2019: Hemoglobin 10.9, MCV 82.5, platelets 550 12/08/2020: Hemoglobin 9.9, MCV 85, platelets 405 12/23/2020: Hemoglobin 10.3, MCV 85, platelets 409 Mild thrombocytosis which could be related to mild iron deficiency versus being an acute phase reactant

## 2021-02-13 ENCOUNTER — Encounter: Payer: Self-pay | Admitting: Family Medicine

## 2021-02-15 ENCOUNTER — Telehealth: Payer: Self-pay

## 2021-02-15 DIAGNOSIS — E1149 Type 2 diabetes mellitus with other diabetic neurological complication: Secondary | ICD-10-CM

## 2021-02-15 DIAGNOSIS — Z794 Long term (current) use of insulin: Secondary | ICD-10-CM

## 2021-02-15 DIAGNOSIS — E1140 Type 2 diabetes mellitus with diabetic neuropathy, unspecified: Secondary | ICD-10-CM

## 2021-02-15 DIAGNOSIS — E113413 Type 2 diabetes mellitus with severe nonproliferative diabetic retinopathy with macular edema, bilateral: Secondary | ICD-10-CM

## 2021-02-15 DIAGNOSIS — E11311 Type 2 diabetes mellitus with unspecified diabetic retinopathy with macular edema: Secondary | ICD-10-CM

## 2021-02-15 NOTE — Telephone Encounter (Signed)
Received fax from pharmacy, PA needed on [place]. Clinical questions submitted via Cover My Meds. Waiting on response, could take up to 72 hours.  Cover My Meds info: Key: [id number]

## 2021-02-15 NOTE — Telephone Encounter (Signed)
Patient returns call to nurse line. Patient wants to stay on Basaglar if possible. Patient reports that when she was on lantus blood sugar levels were not well controlled.   Will proceed with PA for Basaglar.   Talbot Grumbling, RN

## 2021-02-15 NOTE — Telephone Encounter (Signed)
Received phone call from patient and fax from pharmacy that Nancee Liter is not covered by insurance.   Please see the below insurance preferred alternatives.  - Insulin Degludec -Insulin Degludec FlexTouch -Insulin Glargine -Insulin Glargine Solostar -Lantus  -Soliqua  If possible, patient would like to remain with insulin pen vs vial.   Please send in new rx if appropriate.   Talbot Grumbling, RN

## 2021-02-16 ENCOUNTER — Encounter: Payer: Self-pay | Admitting: Family Medicine

## 2021-02-16 NOTE — Telephone Encounter (Signed)
Attempted 3 phone call to patient during afternoon of 1/12 - reached patient on 3rd atempt.  Patient contacted for follow/up of glucose control and insulin supply.  Since December 19th  patient reports she has been without any basaglar insulin.   She has determined that Lantus and Toujeo are her preferred insulins on her 2023 formulary.  She prefers basaglar and states her control was excellent when taking 34 units of Basaglar prior to December 19th 2022.   Reports current blood glucose readings in the mid-high 200s recently - off of insulin.   Discussed options including use of Toujeo (concentrated insluin glargine) - patient reports that Lantus was not as effective as Engineer, agricultural and prefers to not use Lantus.   At this time we do not have any samples of Basaglar or Toujeo.  Patient willing to trial a sample to TRESIBA (insuln degludec) as her basal insulin  We agreed to try 34 units of Tresiba as her blood glucose fasting was in the low 100s but she denied any readings < 184m/dl.    Medication Samples have been provided to the patient.  Drug name: TTyler Aas(insulin degludec)  Qty: 1 pen  LOT: [id number] Exp.Date: 05/05/2021  The patient has been instructed regarding the correct time, dose, and frequency of taking this medication, including desired effects and most common side effects.   PJaneann Forehand5:25 PM 02/16/2021   Total time with patient call and documentation of interaction: 17 minutes.  F/U Phone call planned: 02/18/2020

## 2021-02-16 NOTE — Telephone Encounter (Signed)
Spoke with Dr. Valentina Lucks regarding denial of basaglar and medication management. Patient sent mychart message requesting to only speak with provider.   Dr. Valentina Lucks states he will call patient this afternoon to discuss further.   Talbot Grumbling, RN

## 2021-02-16 NOTE — Telephone Encounter (Signed)
Medication was denied by insurance. Patients insurance prefers Lantus or Toujeo. Per patient, Lantus did not provider good glucose control. This was noted in the PA. Patient reports she would rather not go back on Lantus. Patient is requesting some Basaglar samples until we can "figure out" the nest steps.   Will forward to PCP and Pharmacy Team for advisement.

## 2021-02-17 ENCOUNTER — Other Ambulatory Visit (HOSPITAL_COMMUNITY): Payer: Self-pay

## 2021-02-17 ENCOUNTER — Encounter: Payer: Self-pay | Admitting: Physician Assistant

## 2021-02-21 MED ORDER — TOUJEO SOLOSTAR 300 UNIT/ML ~~LOC~~ SOPN: 34.0000 [IU] | PEN_INJECTOR | Freq: Every morning | SUBCUTANEOUS | 0 refills | Status: DC

## 2021-02-21 NOTE — Telephone Encounter (Signed)
Rx for [facility] sent in

## 2021-02-21 NOTE — Addendum Note (Signed)
Addended byWendy Poet, Tonnie Friedel D on: 02/21/2021 09:50 AM   Modules accepted: Orders

## 2021-02-21 NOTE — Telephone Encounter (Signed)
LEFT MESSAGE REGARDING MEDICATION BEING READY AT [place] WITH $0 COPAY

## 2021-03-03 ENCOUNTER — Other Ambulatory Visit: Payer: Self-pay | Admitting: Internal Medicine

## 2021-03-03 ENCOUNTER — Telehealth: Payer: Self-pay

## 2021-03-03 DIAGNOSIS — K219 Gastro-esophageal reflux disease without esophagitis: Secondary | ICD-10-CM

## 2021-03-03 DIAGNOSIS — K253 Acute gastric ulcer without hemorrhage or perforation: Secondary | ICD-10-CM

## 2021-03-03 NOTE — Telephone Encounter (Signed)
Patient calls nurse line requesting refill on omeprazole. Patient has been receiving this from GI specialist.   Patient states that she requested from them, however, they denied medication. Patient has not yet contacted GI doctor regarding reason for denial.   Patient is asking for Dr. McDiarmid to refill. Advised that as specialist was managing this medication she should contact them for further refills. Patient is still requesting that refill request be sent to Dr. Wendy Poet.   Please advise.   Talbot Grumbling, RN

## 2021-03-03 NOTE — Telephone Encounter (Signed)
It appears that Dr Henrene Pastor (GI) has already sent in omeprazole refills today.

## 2021-03-06 ENCOUNTER — Emergency Department (HOSPITAL_BASED_OUTPATIENT_CLINIC_OR_DEPARTMENT_OTHER): Payer: 59

## 2021-03-06 ENCOUNTER — Emergency Department (HOSPITAL_COMMUNITY)
Admission: EM | Admit: 2021-03-06 | Discharge: 2021-03-07 | Disposition: A | Discharge status: Home / Self Care | Payer: 59 | Attending: Emergency Medicine | Admitting: Emergency Medicine

## 2021-03-06 ENCOUNTER — Other Ambulatory Visit: Payer: Self-pay

## 2021-03-06 ENCOUNTER — Encounter (HOSPITAL_COMMUNITY): Payer: Self-pay | Admitting: Emergency Medicine

## 2021-03-06 DIAGNOSIS — I129 Hypertensive chronic kidney disease with stage 1 through stage 4 chronic kidney disease, or unspecified chronic kidney disease: Secondary | ICD-10-CM | POA: Insufficient documentation

## 2021-03-06 DIAGNOSIS — M542 Cervicalgia: Secondary | ICD-10-CM | POA: Insufficient documentation

## 2021-03-06 DIAGNOSIS — E1122 Type 2 diabetes mellitus with diabetic chronic kidney disease: Secondary | ICD-10-CM | POA: Insufficient documentation

## 2021-03-06 DIAGNOSIS — Z794 Long term (current) use of insulin: Secondary | ICD-10-CM | POA: Insufficient documentation

## 2021-03-06 DIAGNOSIS — Z7901 Long term (current) use of anticoagulants: Secondary | ICD-10-CM | POA: Diagnosis not present

## 2021-03-06 DIAGNOSIS — Z79899 Other long term (current) drug therapy: Secondary | ICD-10-CM | POA: Insufficient documentation

## 2021-03-06 DIAGNOSIS — M79602 Pain in left arm: Secondary | ICD-10-CM

## 2021-03-06 DIAGNOSIS — N183 Chronic kidney disease, stage 3 unspecified: Secondary | ICD-10-CM | POA: Diagnosis not present

## 2021-03-06 DIAGNOSIS — R202 Paresthesia of skin: Secondary | ICD-10-CM | POA: Diagnosis not present

## 2021-03-06 DIAGNOSIS — M79601 Pain in right arm: Secondary | ICD-10-CM | POA: Insufficient documentation

## 2021-03-06 DIAGNOSIS — R200 Anesthesia of skin: Secondary | ICD-10-CM

## 2021-03-06 DIAGNOSIS — D492 Neoplasm of unspecified behavior of bone, soft tissue, and skin: Secondary | ICD-10-CM

## 2021-03-06 LAB — CBG MONITORING, ED: Glucose-Capillary: 119 mg/dL — ABNORMAL HIGH (ref 70–99)

## 2021-03-06 NOTE — ED Provider Notes (Signed)
Received message from ultrasound technician that ultrasound detected an irregularity in both upper arms that we can't really characterize. They have called the vascular surgeon to look at the images, and we both agree she may need further imaging to try to identify it.   Estill Cotta 03/06/21 1843    Isla Pence, MD 03/06/21 1857

## 2021-03-06 NOTE — Progress Notes (Signed)
Upper extremity venous bilateral study completed.  Preliminary results relayed to [place], PA.  See CV Proc for preliminary results report.   Darlin Coco, RDMS, RVT

## 2021-03-06 NOTE — ED Triage Notes (Signed)
Pt c/o right arm pain and bilateral hand numbness at night. First noticed the numbness x 1 week ago. Hx blood clot to right arm, currently taking blood thinner, but has missed doses.

## 2021-03-06 NOTE — ED Provider Triage Note (Signed)
Emergency Medicine Provider Triage Evaluation Note  Alicia West , a 51 y.o. female  was evaluated in triage.  Pt complains of right arm pain and bilateral hand numbness at night.  Patient was diagnosed with a blood clot in her right arm, and has been on Eliquis since November.  She states that she stopped taking for 3 days for dental procedure, but has had no other missed doses besides that.  She first noticed the bilateral numbness 1 week ago, worse when she is trying to sleep and wakes up in the morning.  She also notes generalized tiredness over the past several weeks  Review of Systems  Positive: Bilateral arm pain and hand numbness Negative: Neck pain, back pain  Physical Exam  BP 140/79 (BP Location: Left Arm)    Pulse 92    Temp 98.6 F (37 C) (Oral)    Resp 16    SpO2 99%  Gen:   Awake, no distress   Resp:  Normal effort  MSK:   Moves extremities without difficulty  Other:  Tenderness to palpation over the medial right upper arm, good pulses bilaterally  Medical Decision Making  Medically screening exam initiated at 5:12 PM.  Appropriate orders placed.  KIRSTAN FENTRESS was informed that the remainder of the evaluation will be completed by another provider, this initial triage assessment does not replace that evaluation, and the importance of remaining in the ED until their evaluation is complete.     Kateri Plummer, PA-C 03/06/21 1713

## 2021-03-07 ENCOUNTER — Ambulatory Visit: Payer: 59

## 2021-03-07 ENCOUNTER — Emergency Department (HOSPITAL_COMMUNITY): Payer: 59

## 2021-03-07 LAB — I-STAT CHEM 8, ED
BUN: 23 mg/dL — ABNORMAL HIGH (ref 6–20)
Calcium, Ion: 1.19 mmol/L (ref 1.15–1.40)
Chloride: 104 mmol/L (ref 98–111)
Creatinine, Ser: 1.3 mg/dL — ABNORMAL HIGH (ref 0.44–1.00)
Glucose, Bld: 156 mg/dL — ABNORMAL HIGH (ref 70–99)
HCT: 35 % — ABNORMAL LOW (ref 36.0–46.0)
Hemoglobin: 11.9 g/dL — ABNORMAL LOW (ref 12.0–15.0)
Potassium: 4 mmol/L (ref 3.5–5.1)
Sodium: 139 mmol/L (ref 135–145)
TCO2: 25 mmol/L (ref 22–32)

## 2021-03-07 LAB — CBC WITH DIFFERENTIAL/PLATELET
Abs Immature Granulocytes: 0.03 10*3/uL (ref 0.00–0.07)
Basophils Absolute: 0.1 10*3/uL (ref 0.0–0.1)
Basophils Relative: 1 %
Eosinophils Absolute: 0.3 10*3/uL (ref 0.0–0.5)
Eosinophils Relative: 4 %
HCT: 35.5 % — ABNORMAL LOW (ref 36.0–46.0)
Hemoglobin: 10.7 g/dL — ABNORMAL LOW (ref 12.0–15.0)
Immature Granulocytes: 0 %
Lymphocytes Relative: 21 %
Lymphs Abs: 1.5 10*3/uL (ref 0.7–4.0)
MCH: 26.9 pg (ref 26.0–34.0)
MCHC: 30.1 g/dL (ref 30.0–36.0)
MCV: 89.2 fL (ref 80.0–100.0)
Monocytes Absolute: 0.5 10*3/uL (ref 0.1–1.0)
Monocytes Relative: 7 %
Neutro Abs: 4.8 10*3/uL (ref 1.7–7.7)
Neutrophils Relative %: 67 %
Platelets: 415 10*3/uL — ABNORMAL HIGH (ref 150–400)
RBC: 3.98 MIL/uL (ref 3.87–5.11)
RDW: 14.7 % (ref 11.5–15.5)
WBC: 7.1 10*3/uL (ref 4.0–10.5)
nRBC: 0 % (ref 0.0–0.2)

## 2021-03-07 LAB — BASIC METABOLIC PANEL
Anion gap: 12 (ref 5–15)
BUN: 20 mg/dL (ref 6–20)
CO2: 22 mmol/L (ref 22–32)
Calcium: 9.9 mg/dL (ref 8.9–10.3)
Chloride: 103 mmol/L (ref 98–111)
Creatinine, Ser: 1.28 mg/dL — ABNORMAL HIGH (ref 0.44–1.00)
GFR, Estimated: 51 mL/min — ABNORMAL LOW (ref >60)
Glucose, Bld: 168 mg/dL — ABNORMAL HIGH (ref 70–99)
Potassium: 3.7 mmol/L (ref 3.5–5.1)
Sodium: 137 mmol/L (ref 135–145)

## 2021-03-07 IMAGING — CT CT ANGIO EXTREM UP*R*
1 of 4 series · 12 of 33 positions shown · IV contrast (OMNI 350)
Comparison: None.
COMPARISON: None.

Addendum:
CLINICAL DATA: Right arm pain and bilateral hand numbness.

EXAM:
CT ANGIOGRAPHY OF THE BILATERAL UPPER EXTREMITIES
TECHNIQUE: Multidetector CT imaging of the bilateral upper extremities was
performed using the standard protocol during bolus administration of
intravenous contrast. Multiplanar CT image reconstructions and MIPs
were obtained to evaluate the vascular anatomy.

[Series 5: cta runoff (id) right · axial · 0.59mm/px · z∈[-800,-136]mm · 12 of 263 slices shown]
[im 21/263  soft-tissue]
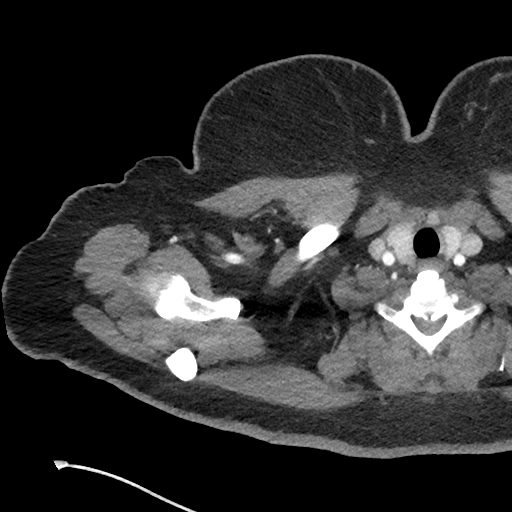
[im 41/263  bone]
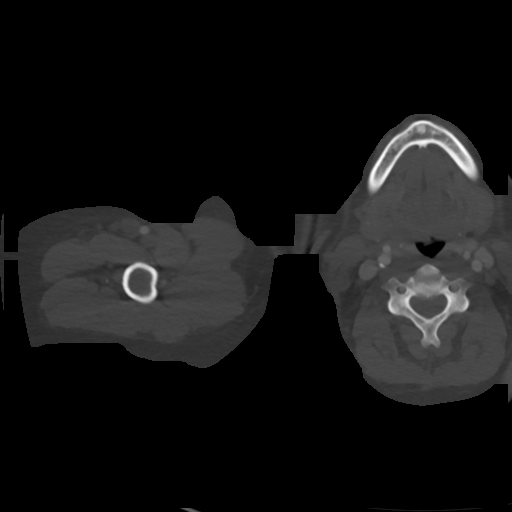
[im 61/263  soft-tissue]
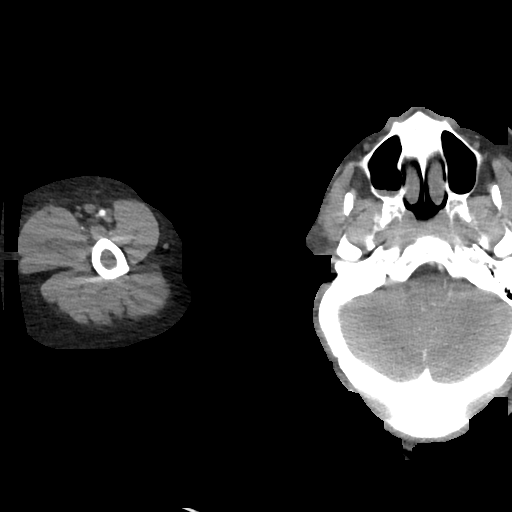
[im 81/263  bone]
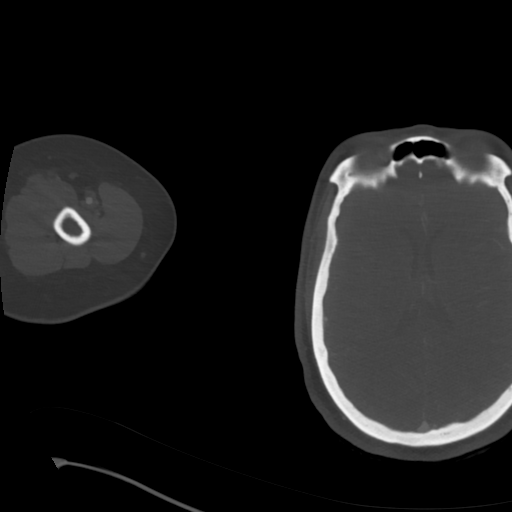
[im 101/263  soft-tissue]
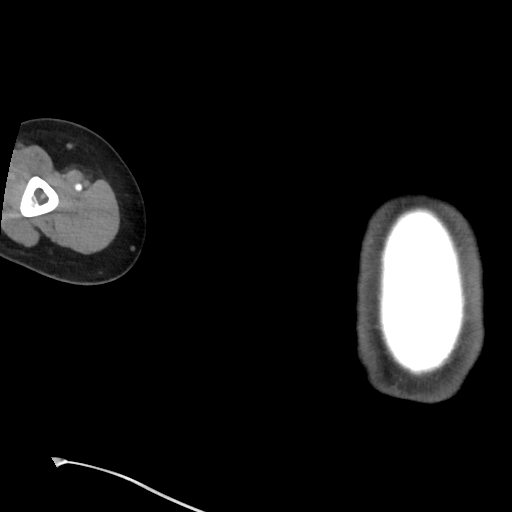
[im 121/263  bone]
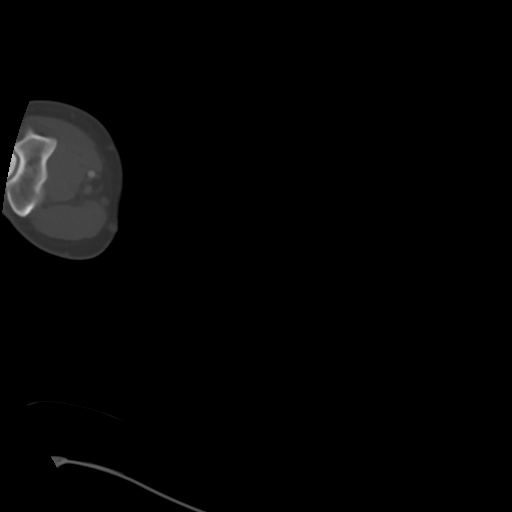
[im 142/263  soft-tissue]
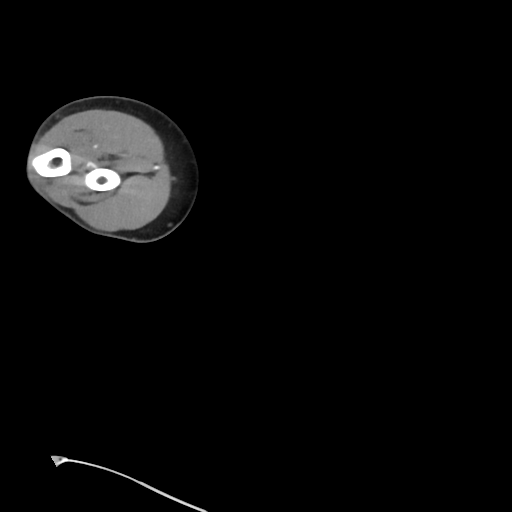
[im 162/263  bone]
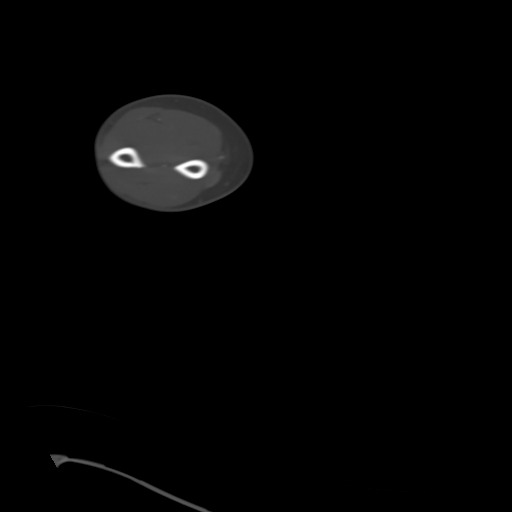
[im 182/263  soft-tissue]
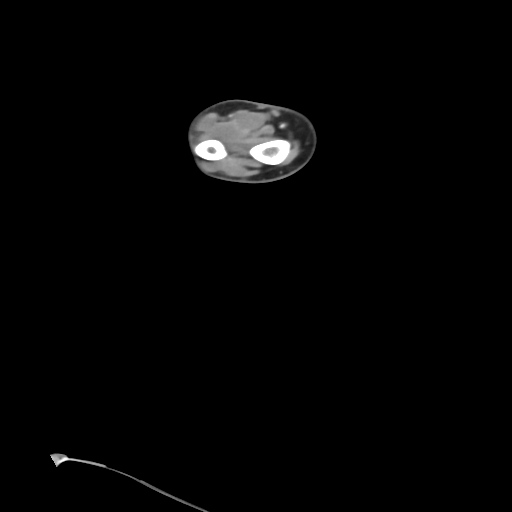
[im 202/263  bone]
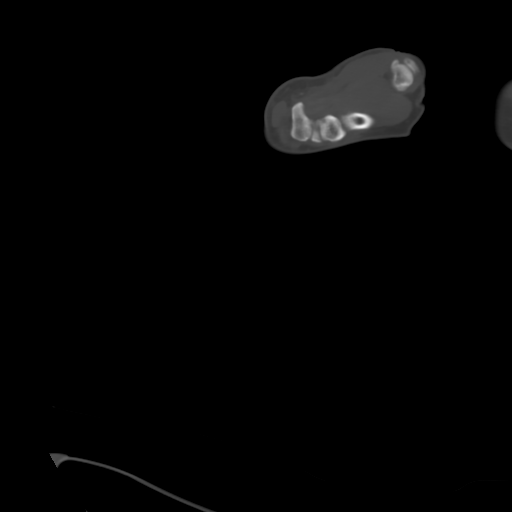
[im 222/263  soft-tissue]
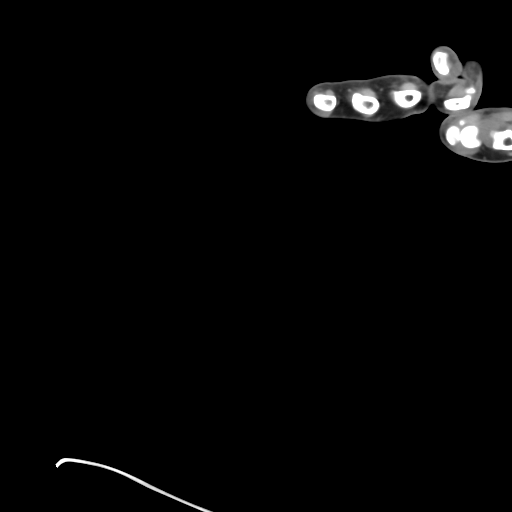
[im 242/263  bone]
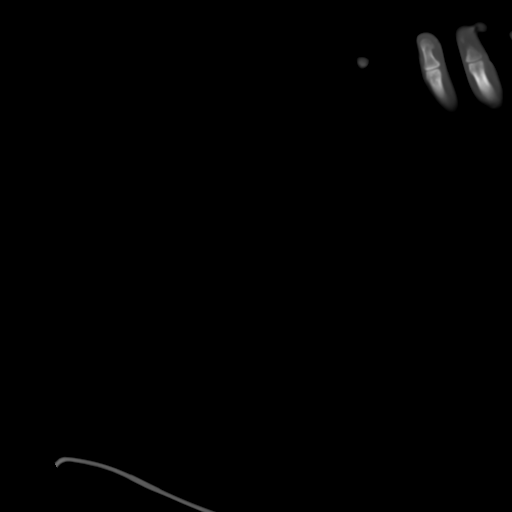

[12 of 33 positions shown; findings below may reference images not displayed]

RADIATION DOSE REDUCTION: This exam was performed according to the
departmental dose-optimization program which includes automated
exposure control, adjustment of the mA and/or kV according to
patient size and/or use of iterative reconstruction technique.

CONTRAST:  100mL OMNIPAQUE IOHEXOL 350 MG/ML SOLN
FINDINGS: Great vessels: Innominate artery, left common carotid artery and
left subclavian artery are patent. Bilateral vertebral arteries are
patent.

Left upper extremity: Limited examination of the left upper
extremity due to contrast being injected from the left upper
extremity. Left subclavian artery is patent. Left axillary artery is
patent. Brachial artery appears to be patent. Radial artery, ulnar
artery and interosseous artery are patent in the forearm. Ulnar
artery is difficult to evaluate in the distal forearm and wrist.
Radial artery is patent across the wrist.

Right upper extremity: Right subclavian artery is patent. Right
axillary artery is widely patent. Right brachial artery is widely
patent. Radial artery, ulnar artery and interosseous artery are
patent in the forearm. Ulnar artery and radial artery are patent to
the wrist.

Venous structures: Contrast filling the paired left brachial veins,
the left basilic vein and the left cephalic vein. Left subclavian
vein and left innominate vein are patent. Limited evaluation of the
right innominate vein. Flow in bilateral internal jugular veins.

Other: Visualized intracranial structures are unremarkable.
Visualized lung apices are clear.

Musculoskeletal: No acute bone abnormalities. Layering fluid and
mucosal disease in the right maxillary sinus.

Review of the MIP images confirms the above findings.
IMPRESSION: 1. Right upper extremity arteries are widely patent.
2. Limited evaluation of the left upper extremity arterial
structures due to venous contamination from the contrast injection.
No gross abnormality to the left upper extremity arteries but
limited evaluation near the distal forearm and wrist as described.

ADDENDUM:
Recent venous duplex examination raised concern for hypoechoic
heterogeneous structures in the upper extremities.

After further review, there is a structure that tracks along the
left brachial vessels best seen on sequence 5, image 166. There is
edema and mild stranding in this area. This structure does not
appear to represent musculature but could represent an enlarged and
abnormal nerve sheath. Similarly, there is a slightly enlarged
structure adjacent to the right brachial artery in the right upper
extremity which may also represent an enlarged nerve sheath.
IMPRESSION: Concern for enlarged bilateral nerve sheaths in both
upper extremities, left side larger than right. Mild stranding or
edema in the left upper extremity neurovascular bundle region.
Findings are nonspecific but could be related to underlying nerve
sheath tumors. Recommend further characterization with MRI, with and
without contrast.

*** End of Addendum ***
RADIATION DOSE REDUCTION: This exam was performed according to the
departmental dose-optimization program which includes automated
exposure control, adjustment of the mA and/or kV according to
patient size and/or use of iterative reconstruction technique.

CONTRAST:  100mL OMNIPAQUE IOHEXOL 350 MG/ML SOLN
FINDINGS: Great vessels: Innominate artery, left common carotid artery and
left subclavian artery are patent. Bilateral vertebral arteries are
patent.

Left upper extremity: Limited examination of the left upper
extremity due to contrast being injected from the left upper
extremity. Left subclavian artery is patent. Left axillary artery is
patent. Brachial artery appears to be patent. Radial artery, ulnar
artery and interosseous artery are patent in the forearm. Ulnar
artery is difficult to evaluate in the distal forearm and wrist.
Radial artery is patent across the wrist.

Right upper extremity: Right subclavian artery is patent. Right
axillary artery is widely patent. Right brachial artery is widely
patent. Radial artery, ulnar artery and interosseous artery are
patent in the forearm. Ulnar artery and radial artery are patent to
the wrist.

Venous structures: Contrast filling the paired left brachial veins,
the left basilic vein and the left cephalic vein. Left subclavian
vein and left innominate vein are patent. Limited evaluation of the
right innominate vein. Flow in bilateral internal jugular veins.

Other: Visualized intracranial structures are unremarkable.
Visualized lung apices are clear.

Musculoskeletal: No acute bone abnormalities. Layering fluid and
mucosal disease in the right maxillary sinus.

Review of the MIP images confirms the above findings.
IMPRESSION: 1. Right upper extremity arteries are widely patent.
2. Limited evaluation of the left upper extremity arterial
structures due to venous contamination from the contrast injection.
No gross abnormality to the left upper extremity arteries but
limited evaluation near the distal forearm and wrist as described.

## 2021-03-07 IMAGING — CT CT ANGIO EXTREM UP*L*
1 of 5 series · 12 of 33 positions shown · IV contrast (OMNI 350)
Comparison: None.
COMPARISON: None.

Addendum:
CLINICAL DATA: Right arm pain and bilateral hand numbness.

EXAM:
CT ANGIOGRAPHY OF THE BILATERAL UPPER EXTREMITIES
TECHNIQUE: Multidetector CT imaging of the bilateral upper extremities was
performed using the standard protocol during bolus administration of
intravenous contrast. Multiplanar CT image reconstructions and MIPs
were obtained to evaluate the vascular anatomy.

[Series 5: cta runoff (id) left · axial · 0.55mm/px · z∈[-800,-136]mm · 12 of 263 slices shown]
[im 21/263  soft-tissue]
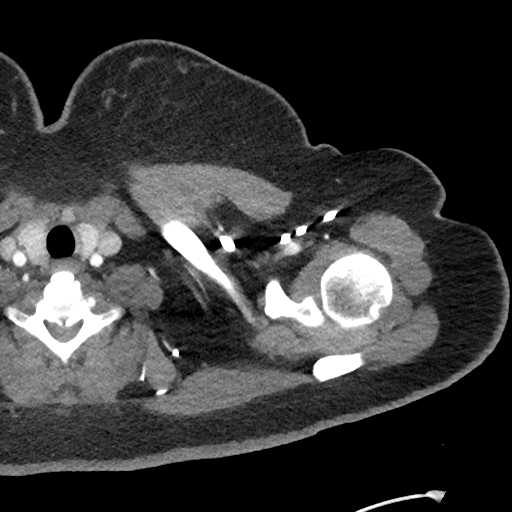
[im 41/263  bone]
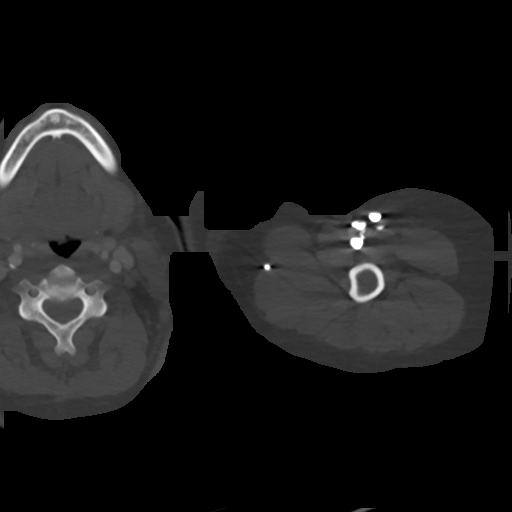
[im 61/263  soft-tissue]
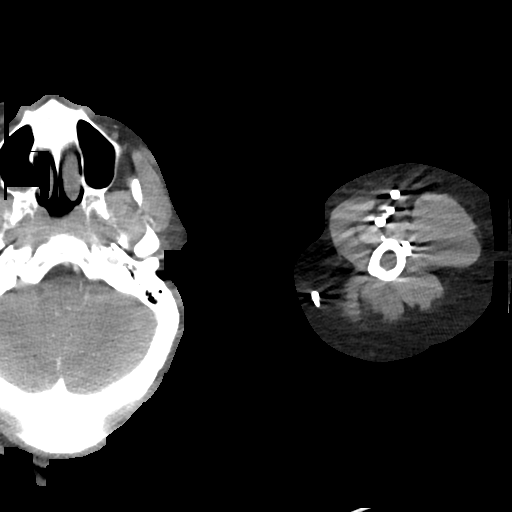
[im 81/263  bone]
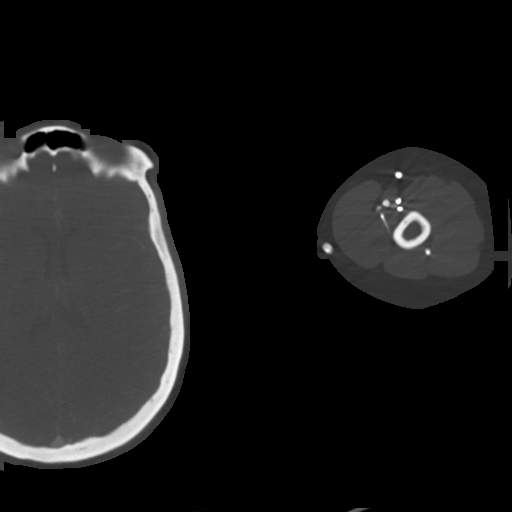
[im 101/263  soft-tissue]
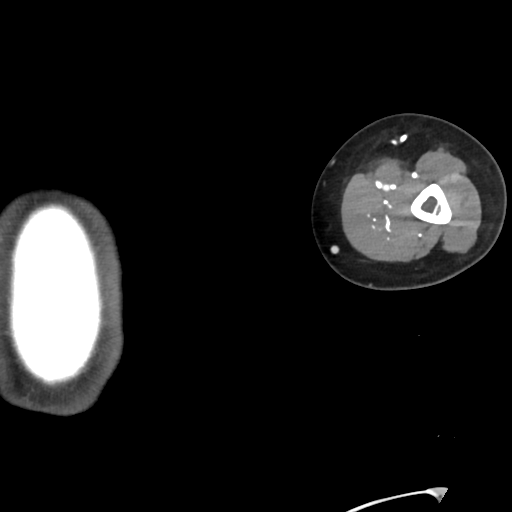
[im 121/263  bone]
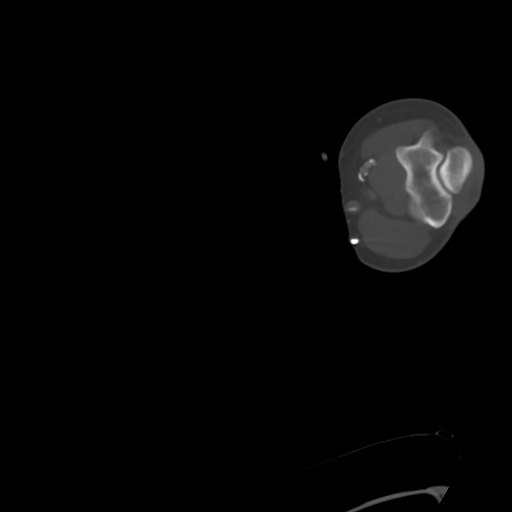
[im 142/263  soft-tissue]
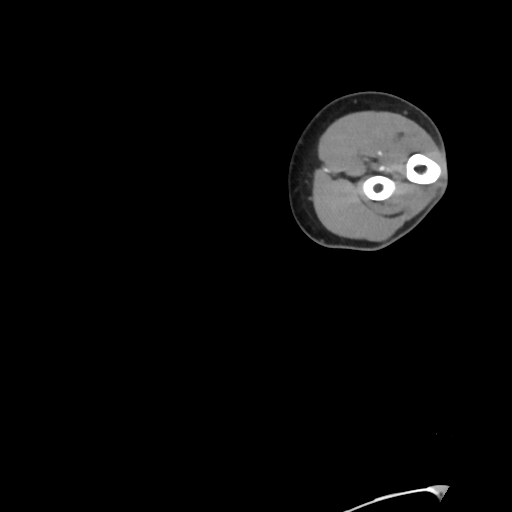
[im 162/263  bone]
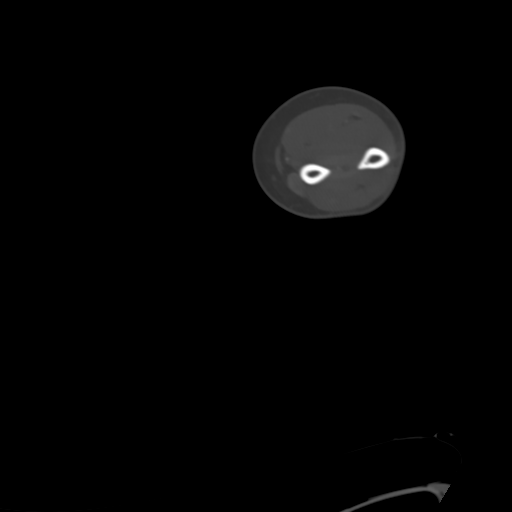
[im 182/263  soft-tissue]
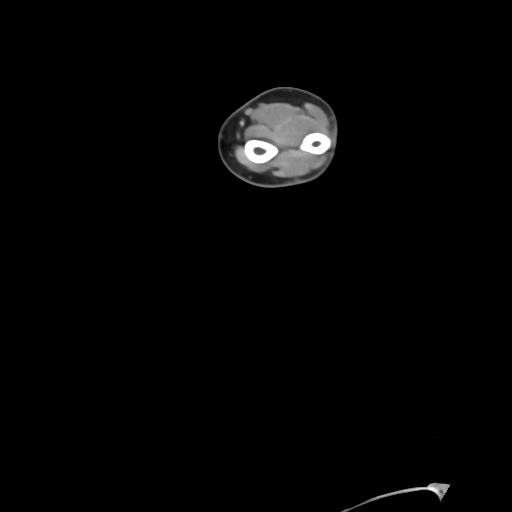
[im 202/263  bone]
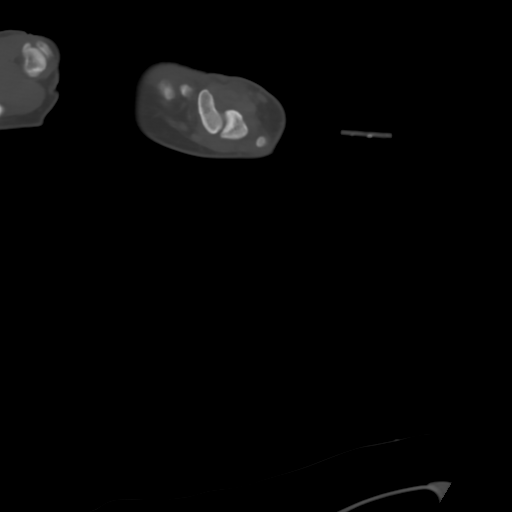
[im 222/263  soft-tissue]
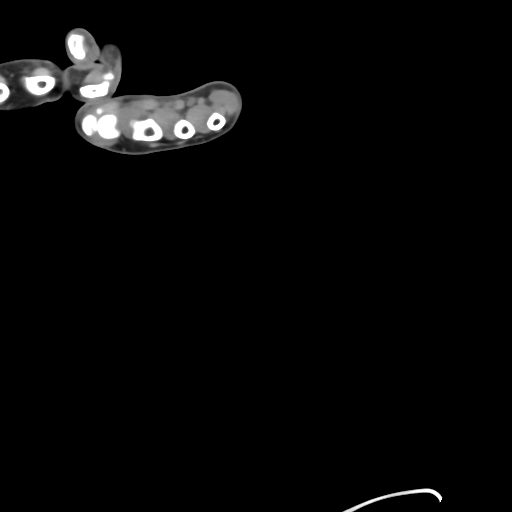
[im 242/263  bone]
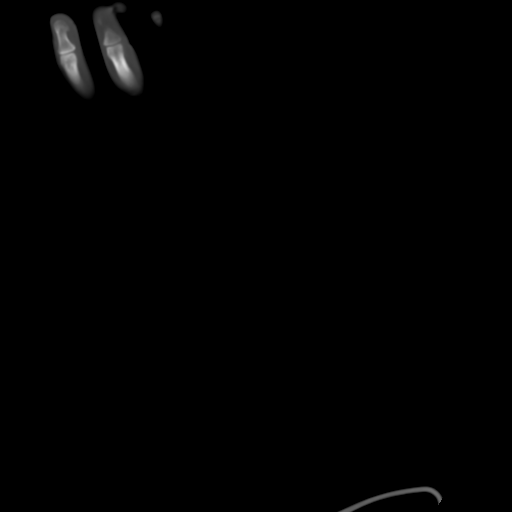

[12 of 33 positions shown; findings below may reference images not displayed]

RADIATION DOSE REDUCTION: This exam was performed according to the
departmental dose-optimization program which includes automated
exposure control, adjustment of the mA and/or kV according to
patient size and/or use of iterative reconstruction technique.

CONTRAST:  100mL OMNIPAQUE IOHEXOL 350 MG/ML SOLN
FINDINGS: Great vessels: Innominate artery, left common carotid artery and
left subclavian artery are patent. Bilateral vertebral arteries are
patent.

Left upper extremity: Limited examination of the left upper
extremity due to contrast being injected from the left upper
extremity. Left subclavian artery is patent. Left axillary artery is
patent. Brachial artery appears to be patent. Radial artery, ulnar
artery and interosseous artery are patent in the forearm. Ulnar
artery is difficult to evaluate in the distal forearm and wrist.
Radial artery is patent across the wrist.

Right upper extremity: Right subclavian artery is patent. Right
axillary artery is widely patent. Right brachial artery is widely
patent. Radial artery, ulnar artery and interosseous artery are
patent in the forearm. Ulnar artery and radial artery are patent to
the wrist.

Venous structures: Contrast filling the paired left brachial veins,
the left basilic vein and the left cephalic vein. Left subclavian
vein and left innominate vein are patent. Limited evaluation of the
right innominate vein. Flow in bilateral internal jugular veins.

Other: Visualized intracranial structures are unremarkable.
Visualized lung apices are clear.

Musculoskeletal: No acute bone abnormalities. Layering fluid and
mucosal disease in the right maxillary sinus.

Review of the MIP images confirms the above findings.
IMPRESSION: 1. Right upper extremity arteries are widely patent.
2. Limited evaluation of the left upper extremity arterial
structures due to venous contamination from the contrast injection.
No gross abnormality to the left upper extremity arteries but
limited evaluation near the distal forearm and wrist as described.

ADDENDUM:
Recent venous duplex examination raised concern for hypoechoic
heterogeneous structures in the upper extremities.

After further review, there is a structure that tracks along the
left brachial vessels best seen on sequence 5, image 166. There is
edema and mild stranding in this area. This structure does not
appear to represent musculature but could represent an enlarged and
abnormal nerve sheath. Similarly, there is a slightly enlarged
structure adjacent to the right brachial artery in the right upper
extremity which may also represent an enlarged nerve sheath.
IMPRESSION: Concern for enlarged bilateral nerve sheaths in both
upper extremities, left side larger than right. Mild stranding or
edema in the left upper extremity neurovascular bundle region.
Findings are nonspecific but could be related to underlying nerve
sheath tumors. Recommend further characterization with MRI, with and
without contrast.

*** End of Addendum ***
RADIATION DOSE REDUCTION: This exam was performed according to the
departmental dose-optimization program which includes automated
exposure control, adjustment of the mA and/or kV according to
patient size and/or use of iterative reconstruction technique.

CONTRAST:  100mL OMNIPAQUE IOHEXOL 350 MG/ML SOLN
FINDINGS: Great vessels: Innominate artery, left common carotid artery and
left subclavian artery are patent. Bilateral vertebral arteries are
patent.

Left upper extremity: Limited examination of the left upper
extremity due to contrast being injected from the left upper
extremity. Left subclavian artery is patent. Left axillary artery is
patent. Brachial artery appears to be patent. Radial artery, ulnar
artery and interosseous artery are patent in the forearm. Ulnar
artery is difficult to evaluate in the distal forearm and wrist.
Radial artery is patent across the wrist.

Right upper extremity: Right subclavian artery is patent. Right
axillary artery is widely patent. Right brachial artery is widely
patent. Radial artery, ulnar artery and interosseous artery are
patent in the forearm. Ulnar artery and radial artery are patent to
the wrist.

Venous structures: Contrast filling the paired left brachial veins,
the left basilic vein and the left cephalic vein. Left subclavian
vein and left innominate vein are patent. Limited evaluation of the
right innominate vein. Flow in bilateral internal jugular veins.

Other: Visualized intracranial structures are unremarkable.
Visualized lung apices are clear.

Musculoskeletal: No acute bone abnormalities. Layering fluid and
mucosal disease in the right maxillary sinus.

Review of the MIP images confirms the above findings.
IMPRESSION: 1. Right upper extremity arteries are widely patent.
2. Limited evaluation of the left upper extremity arterial
structures due to venous contamination from the contrast injection.
No gross abnormality to the left upper extremity arteries but
limited evaluation near the distal forearm and wrist as described.

## 2021-03-07 MED ORDER — IOHEXOL 350 MG/ML SOLN
100.0000 mL | Freq: Once | INTRAVENOUS | Status: AC | PRN
  Administered 2021-03-07: 100 mL via INTRAVENOUS

## 2021-03-07 MED ORDER — ACETAMINOPHEN 325 MG PO TABS
650.0000 mg | ORAL_TABLET | Freq: Once | ORAL | Status: AC
  Administered 2021-03-07: 650 mg via ORAL
  Filled 2021-03-07: qty 2

## 2021-03-07 NOTE — Discharge Instructions (Signed)
You have been seen today for bilateral hand numbness and arm pain--your ultrasound showed no DVT but did show abnormality around the brachial artery. 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  We have discussed with radiology and vascular surgery and recommend outpatient MRI of your proximal arms (left if they are unable to do both at once)-I recommend discussing this with your doctor. If you develop any new or worsening symptoms, please return to the ED for further care.

## 2021-03-07 NOTE — ED Provider Notes (Signed)
[facility] Provider Note   CSN: [id number] Arrival date & time: 03/06/21  1547     History  Chief Complaint  Patient presents with   Arm Pain    MARKELA WEE is a 51 y.o. female.  HPI     This is a 51 year old female with a history of right upper extremity DVT on Eliquis who presents with bilateral arm pain and numbness.  Patient reports 1 to 2-week history of worsening bilateral arm pain and numbness in the hands.  This is worse at night.  She reports compliance with her Eliquis.  She denies any injury.  Denies any significant heavy lifting.  Has not noted any bowel or bladder difficulty.  States that the pain and numbness are symmetric.  Pain is mostly in the posterior portion of the upper arm bilaterally, no overlying skin changes.  No fevers.  Reports some bilateral neck discomfort.  Home Medications Prior to Admission medications   Medication Sig Start Date End Date Taking? Authorizing Provider  acetaminophen (TYLENOL) 500 MG tablet Take 500 mg by mouth every 6 (six) hours as needed.    [provider]  apixaban (ELIQUIS) 5 MG TABS tablet Take 1 tablet (5 mg total) by mouth 2 (two) times daily. 11/29/20   McDiarmid, Blane Ohara, MD  atorvastatin (LIPITOR) 40 MG tablet Take 1 tablet (40 mg total) by mouth 2 (two) times a week. 02/25/20   McDiarmid, Blane Ohara, MD  carvedilol (COREG) 25 MG tablet TAKE ONE TABLET BY MOUTH TWICE A DAY WITH MEALS 04/04/20   McDiarmid, Blane Ohara, MD  Clindamycin-Benzoyl Per, Refr, gel Apply to face each morning 08/31/20   [provider]  CONTOUR NEXT TEST test strip USE TO CHECK FOR BLOOD SUGAR THREE TIMES A DAY AS DIRECTED 01/02/21   McDiarmid, Blane Ohara, MD  gabapentin (NEURONTIN) 100 MG capsule Take 3 capsules (300 mg total) by mouth at bedtime. Patient taking differently: Take 300 mg by mouth at bedtime. Taking 200 mg occasionally for right arm pain 11/24/20   McDiarmid, Blane Ohara, MD  insulin glargine, 1  Unit Dial, (TOUJEO SOLOSTAR) 300 UNIT/ML Solostar Pen Inject 34 Units into the skin in the morning. 02/21/21   McDiarmid, Blane Ohara, MD  Lancets MISC Use Contour Next Lancet to check blood glucose 3 times a day.  Dx code E11.65. Patient taking differently: Use Contour Next Lancet to check blood glucose 3 times a day.  Dx code E11.65. 05/02/15   Alveda Reasons, MD  LINZESS 145 MCG CAPS capsule TAKE ONE CAPSULE BY MOUTH DAILY 12/14/20   Zehr, Laban Emperor, PA-C  losartan-hydrochlorothiazide (HYZAAR) 100-25 MG tablet TAKE ONE TABLET BY MOUTH DAILY 11/25/20   McDiarmid, Blane Ohara, MD  metFORMIN (GLUCOPHAGE) 500 MG tablet TAKE ONE TABLET BY MOUTH TWICE A DAY WITH A MEAL 11/09/20   Hensel, Jamal Collin, MD  NOVOFINE PEN NEEDLE 32G X 6 MM MISC USE TO INJECT INSULIN DAILY AS INSTRUCTED 01/09/21   McDiarmid, Blane Ohara, MD  omeprazole (PRILOSEC) 20 MG capsule TAKE ONE CAPSULE BY MOUTH DAILY 03/03/21   Irene Shipper, MD  OZEMPIC, 1 MG/DOSE, 4 MG/3ML SOPN DIAL AND INJECT UNDER THE SKIN 1 MG WEEKLY 12/15/20   McDiarmid, Blane Ohara, MD  prenatal vitamin w/FE, FA (NATACHEW) 29-1 MG CHEW chewable tablet Chew 1 tablet by mouth daily at 12 noon. 12/12/20   McDiarmid, Blane Ohara, MD  spironolactone (ALDACTONE) 25 MG tablet TAKE ONE TABLET BY MOUTH DAILY 11/14/20  McDiarmid, Blane Ohara, MD  Syringe, Disposable, 1 ML MISC 1 each by Does not apply route 3 (three) times daily. 04/12/17   Everrett Coombe, MD  tretinoin (RETIN-A) 0.025 % cream Apply to face and back nightly to tolerance. 08/31/20   [provider]      Allergies    Ace inhibitors, Amlodipine, Lipitor [atorvastatin], and Penicillins    Review of Systems   Review of Systems  Musculoskeletal:        Arm pain  Neurological:  Positive for numbness.  All other systems reviewed and are negative.  Physical Exam Updated Vital Signs BP (!) 175/109 (BP Location: Left Arm)    Pulse 91    Temp 98.6 F (37 C) (Oral)    Resp 19    SpO2 100%  Physical Exam Vitals and nursing note  reviewed.  Constitutional:      Appearance: She is well-developed. She is obese. She is not ill-appearing.  HENT:     Head: Normocephalic and atraumatic.     Nose: Nose normal.     Mouth/Throat:     Mouth: Mucous membranes are moist.  Eyes:     Pupils: Pupils are equal, round, and reactive to light.  Cardiovascular:     Rate and Rhythm: Normal rate and regular rhythm.     Heart sounds: Normal heart sounds.  Pulmonary:     Effort: Pulmonary effort is normal. No respiratory distress.     Breath sounds: No wheezing.  Abdominal:     Palpations: Abdomen is soft.  Musculoskeletal:        General: No deformity. Normal range of motion.     Cervical back: Neck supple.     Comments: No significant deformities of the upper extremity  Skin:    General: Skin is warm and dry.     Comments: No overlying skin change  Neurological:     Mental Status: She is alert and oriented to person, place, and time.     Comments: 5 out of 5 strength bilateral upper extremities with deltoids, biceps, triceps, grip strength bilaterally  Psychiatric:        Mood and Affect: Mood normal.    ED Results / Procedures / Treatments   Labs (all labs ordered are listed, but only abnormal results are displayed) Labs Reviewed  CBC WITH DIFFERENTIAL/PLATELET - Abnormal; Notable for the following components:      Result Value   Hemoglobin 10.7 (*)    HCT 35.5 (*)    Platelets 415 (*)    All other components within normal limits  BASIC METABOLIC PANEL - Abnormal; Notable for the following components:   Glucose, Bld 168 (*)    Creatinine, Ser 1.28 (*)    GFR, Estimated 51 (*)    All other components within normal limits  CBG MONITORING, ED - Abnormal; Notable for the following components:   Glucose-Capillary 119 (*)    All other components within normal limits  I-STAT CHEM 8, ED - Abnormal; Notable for the following components:   BUN 23 (*)    Creatinine, Ser 1.30 (*)    Glucose, Bld 156 (*)    Hemoglobin  11.9 (*)    HCT 35.0 (*)    All other components within normal limits    EKG None  Radiology UE VENOUS DUPLEX (7am - 7pm)  Result Date: 03/06/2021 UPPER VENOUS STUDY  Patient Name:  Alicia West  Date of Exam:   03/06/2021 Medical Rec #: [id number]  Accession #:    [id number] Date of Birth: 11-Sep-1970       Patient Gender: F Patient Age:   49 years Exam Location:  [facility] Procedure:      VAS [place] UPPER EXTREMITY VENOUS DUPLEX Referring Phys: Audrie Gallus ROEMHILDT --------------------------------------------------------------------------------  Indications: Bilateral arm pain, patient complains of intermittent numbness Anticoagulation: Eliquis. Comparison Study: 11-29-2020 Prior right upper extremity venous study. Performing Technologist: Darlin Coco RDMS, RVT  Examination Guidelines: A complete evaluation includes B-mode imaging, spectral Doppler, color Doppler, and power Doppler as needed of all accessible portions of each vessel. Bilateral testing is considered an integral part of a complete examination. Limited examinations for reoccurring indications may be performed as noted.  Right Findings: +----------+------------+---------+-----------+----------+---------------------+  RIGHT      Compressible Phasicity Spontaneous Properties        Summary         +----------+------------+---------+-----------+----------+---------------------+  IJV            Full        Yes        Yes                                       +----------+------------+---------+-----------+----------+---------------------+  Subclavian     Full                                                             +----------+------------+---------+-----------+----------+---------------------+  Axillary       Full                                                             +----------+------------+---------+-----------+----------+---------------------+  Brachial       Full                                      Limited visibility of                                                                one of the two                                                                brachial veins due to  extrinsic compression                                                                (see clip 9)       +----------+------------+---------+-----------+----------+---------------------+  Radial         Full                                                             +----------+------------+---------+-----------+----------+---------------------+  Ulnar          Full                                                             +----------+------------+---------+-----------+----------+---------------------+  Cephalic       Full                                                             +----------+------------+---------+-----------+----------+---------------------+  Basilic        Full                                                             +----------+------------+---------+-----------+----------+---------------------+ Previously characterized brachial vein thrombus strongly resembles the heterogenous, vascularized structure of the left upper extremity. Etiology of the bilateral upper extremity heterogenous areas is unknown; further imaging may be warranted.  Left Findings: +----------+------------+---------+-----------+----------+---------------------+  LEFT       Compressible Phasicity Spontaneous Properties        Summary         +----------+------------+---------+-----------+----------+---------------------+  IJV            Full        Yes        Yes                                       +----------+------------+---------+-----------+----------+---------------------+  Subclavian     Full                                                             +----------+------------+---------+-----------+----------+---------------------+  Axillary       Full                                                              +----------+------------+---------+-----------+----------+---------------------+  Brachial       Full                                      Limited visibility of                                                               one of the two                                                                brachial veins due to                                                            extrinsic compression                                                               (see image 40)      +----------+------------+---------+-----------+----------+---------------------+  Radial         Full                                                             +----------+------------+---------+-----------+----------+---------------------+  Ulnar          Full                                                             +----------+------------+---------+-----------+----------+---------------------+  Cephalic       Full                                                             +----------+------------+---------+-----------+----------+---------------------+  Basilic        Full                                                             +----------+------------+---------+-----------+----------+---------------------+  Summary:  Right: No evidence of deep vein thrombosis in the upper extremity. No evidence of superficial vein thrombosis in the upper extremity. Hypoechoic, heterogenous structure of unknown etiology visualized in the right upper arm adjacent to the brachial vasculature. Further imaging may be warranted.  Left: No evidence of deep vein thrombosis in the upper extremity. No evidence of superficial vein thrombosis in the upper extremity. Hypoechoic, heterogenous structure of unknown etiology visualized in the left upper arm adjacent to the brachial vasculature. Structure is vascularized, with flow appearing to extend from the brachial artery. Further imaging may be warranted.  *See table(s) above for measurements and  observations.    Preliminary     Procedures Procedures    Medications Ordered in ED Medications  acetaminophen (TYLENOL) tablet 650 mg (650 mg Oral Given 03/07/21 7628)    ED Course/ Medical Decision Making/ A&P                           Medical Decision Making Amount and/or Complexity of Data Reviewed Labs: ordered. Radiology: ordered.  Risk OTC drugs.   This patient presents to the ED for concern of bilateral arm pain and, this involves an extensive number of treatment options, and is a complaint that carries with it a high risk of complications and morbidity.  The differential diagnosis includes cervical radiculopathy, bilateral DVT, injury  MDM:    51 year old female with bilateral arm pain and paresthesias.  She is nontoxic and vital signs are reassuring.  Patient has no reproducible tenderness on exam.  She has intact strength.  No midline C-spine tenderness.  Ultrasounds of the bilateral upper extremities indicate an unclear heterogenicity bilaterally.  Recommend further imaging.  She has 2+ radial pulses bilaterally. (Labs, imaging)  Labs: I Ordered, and personally interpreted labs.  The pertinent results include: CBC, Chem-8 at baseline  Imaging Studies ordered: I ordered imaging studies including ultrasound studies, CT angio pending I independently visualized and interpreted imaging. I agree with the radiologist interpretation  Additional history obtained from chart review.  External records from outside source obtained and reviewed including prior ultrasound  Critical Interventions: N/A  Consultations: I requested consultation with the NA,  and discussed lab and imaging findings as well as pertinent plan - they recommend: N/A  Cardiac Monitoring: The patient was maintained on a cardiac monitor.  I personally viewed and interpreted the cardiac monitored which showed an underlying rhythm of: N/A  Reevaluation: After the interventions noted above, I  reevaluated the patient and found that they have :stayed the same   Considered admission for: N/A  Social Determinants of Health: Lives independently  Disposition: Pending  Co morbidities that complicate the patient evaluation  Past Medical History:  Diagnosis Date   Allergy    seasonal sinus allergies   ANEMIA, IRON DEFICIENCY, UNSPEC. 04/04/2006   Qualifier: Diagnosis of  By: Samara Snide     Bilateral senile cataracts 11/20/2018   Mixed OU   Binge-eating disorder, moderate 03/17/2013   Breast lump 07/22/2009   Qualifier: Diagnosis of  By: Netty Starring  MD, Lucianne Muss     CKD stage 3 secondary to diabetes ([place]) 10/17/2018   DEPRESSIVE DISORDER, NOS 04/04/2006   Qualifier: Diagnosis of  By: Samara Snide     Diabetes mellitus    on meds   Diabetic macular edema ([place])    by patient report 10/27/2019   Diabetic neuropathy ([place]) 10/17/2018   Diabetic peripheral neuropathy ([place])    per patient  Diabetic visual loss: severe vision impairment of both eyes, with macular edema, with severe nonproliferative retinopathy, associated with type 2 diabetes mellitus ([place]) 03/70/9643       Folliculitis 8/38/1840   Heavy menses    Pt reports she was to get a hysterectomy   Herpes labialis 09/06/2010   Herpes simplex labialis    History of HPV infection 08/18/2019   Hypercalcemia 08/13/2019   Hyperlipidemia    diet controlled/not taking meds for this   Hypertension    on meds   Hypertensive retinopathy    OU   Iron deficiency anemia 04/04/2006   Qualifier: Diagnosis of  By: Samara Snide     Loud snoring 10/06/2019   Menorrhagia 04/04/2006   Qualifier: Diagnosis of  By: Samara Snide     Morbid obesity ([place]) 05/01/2013   Neuromuscular disorder ([place])    Obstructive sleep apnea 11/27/2019   Osteoporosis    bilateral hips R>L   Paroxysmal supraventricular tachycardia ([place]) 10/26/2014   PONV (postoperative nausea and vomiting)    POSITIVE PPD 10/18/2009   Qualifier: Diagnosis of  By: Zebedee Iba NP, Manuela Schwartz      Prurigo nodularis 10/17/2018   RHINITIS, ALLERGIC 04/04/2006   Qualifier: Diagnosis of  By: Samara Snide     Right arm pain 11/25/2020   Sleep apnea    Sleep related choking sensation 10/06/2019   TB (tuberculosis) contact    + skin test,/- chest x-ray - in mid 2000's   Thrombocytosis 08/18/2019   Tuberculosis    postitive 2011-[facility]-cleared per pt   Tubular adenoma of colon 2021   2 mm sessile, Rikki Spearing MD ([facility])   Visual impairment of left eye 08/18/2019     Medicines Meds ordered this encounter  Medications   acetaminophen (TYLENOL) tablet 650 mg    I have reviewed the patients home medicines and have made adjustments as needed  Problem List / ED Course: Problem List Items Addressed This Visit   None               Final Clinical Impression(s) / ED Diagnoses Final diagnoses:  None    Rx / DC Orders ED Discharge Orders     None         Merryl Hacker, MD 03/07/21 [phone]

## 2021-03-08 ENCOUNTER — Telehealth: Payer: Self-pay | Admitting: Nurse Practitioner

## 2021-03-08 ENCOUNTER — Encounter: Payer: Self-pay | Admitting: Nurse Practitioner

## 2021-03-08 ENCOUNTER — Telehealth (INDEPENDENT_AMBULATORY_CARE_PROVIDER_SITE_OTHER): Payer: 59 | Admitting: Nurse Practitioner

## 2021-03-08 DIAGNOSIS — K219 Gastro-esophageal reflux disease without esophagitis: Secondary | ICD-10-CM

## 2021-03-08 MED ORDER — OMEPRAZOLE 20 MG PO CPDR: DELAYED_RELEASE_CAPSULE | ORAL | 3 refills | Status: DC

## 2021-03-08 NOTE — Progress Notes (Signed)
Patient Care Team: McDiarmid, Blane Ohara, MD as PCP - General (Family Medicine) Bernarda Caffey, MD as Consulting Physician (Ophthalmology) Dohmeier, Asencion Partridge, MD as Consulting Physician (Neurology)  DIAGNOSIS:    ICD-10-CM   1. Pain in both upper arms  M79.621 MR HUMERUS LEFT W WO CONTRAST   N82.956 MR HUMERUS RIGHT W WO CONTRAST    2. Thrombocytosis  D75.839     3. Anemia of chronic kidney failure, stage 3 (moderate) (HCC)  N18.30    D63.1       CHIEF COMPLIANT: Follow-up of recent emergency room visit  INTERVAL HISTORY: Alicia West is a 51 y.o. with above-mentioned history of DVT of brachial vein of right upper extremity and thrombocytosis. She presents to the clinic today for follow-up.  She went to emergency room recently complaining of pain in her arms.  Ultrasound did not reveal any blood clots and she had CT scans of bilateral upper extremities that seem to show some thickening of the peripheral nerve sheaths concerning for peripheral nerve sheath tumor.  The recommendation from radiology was to obtain an MRI of the upper extremity.  ALLERGIES:  is allergic to ace inhibitors, amlodipine, lipitor [atorvastatin], and penicillins.  MEDICATIONS:  Current Outpatient Medications  Medication Sig Dispense Refill   acetaminophen (TYLENOL) 500 MG tablet Take 500 mg by mouth every 6 (six) hours as needed.     apixaban (ELIQUIS) 5 MG TABS tablet Take 1 tablet (5 mg total) by mouth 2 (two) times daily. 180 tablet 0   atorvastatin (LIPITOR) 40 MG tablet Take 1 tablet (40 mg total) by mouth 2 (two) times a week. 24 tablet 3   carvedilol (COREG) 25 MG tablet TAKE ONE TABLET BY MOUTH TWICE A DAY WITH MEALS 180 tablet 3   Clindamycin-Benzoyl Per, Refr, gel Apply to face each morning     CONTOUR NEXT TEST test strip USE TO CHECK FOR BLOOD SUGAR THREE TIMES A DAY AS DIRECTED 50 strip PRN   gabapentin (NEURONTIN) 100 MG capsule Take 3 capsules (300 mg total) by mouth at bedtime. (Patient taking  differently: Take 300 mg by mouth at bedtime. Taking 200 mg occasionally for right arm pain) 90 capsule 3   insulin glargine, 1 Unit Dial, (TOUJEO SOLOSTAR) 300 UNIT/ML Solostar Pen Inject 34 Units into the skin in the morning. 10.5 mL 0   Lancets MISC Use Contour Next Lancet to check blood glucose 3 times a day.  Dx code E11.65. (Patient taking differently: Use Contour Next Lancet to check blood glucose 3 times a day.  Dx code E11.65.) 100 each 12   LINZESS 145 MCG CAPS capsule TAKE ONE CAPSULE BY MOUTH DAILY 30 capsule 5   losartan-hydrochlorothiazide (HYZAAR) 100-25 MG tablet TAKE ONE TABLET BY MOUTH DAILY 90 tablet 3   metFORMIN (GLUCOPHAGE) 500 MG tablet TAKE ONE TABLET BY MOUTH TWICE A DAY WITH A MEAL 180 tablet 3   NOVOFINE PEN NEEDLE 32G X 6 MM MISC USE TO INJECT INSULIN DAILY AS INSTRUCTED 100 each 12   omeprazole (PRILOSEC) 20 MG capsule Take 20 mg daily 30 minutes before breakfast 90 capsule 3   OZEMPIC, 1 MG/DOSE, 4 MG/3ML SOPN DIAL AND INJECT UNDER THE SKIN 1 MG WEEKLY 3 mL 5   prenatal vitamin w/FE, FA (NATACHEW) 29-1 MG CHEW chewable tablet Chew 1 tablet by mouth daily at 12 noon. 90 tablet 3   spironolactone (ALDACTONE) 25 MG tablet TAKE ONE TABLET BY MOUTH DAILY 90 tablet 3   Syringe, Disposable, 1  ML MISC 1 each by Does not apply route 3 (three) times daily. 100 each 0   tretinoin (RETIN-A) 0.025 % cream Apply to face and back nightly to tolerance.     No current facility-administered medications for this visit.    PHYSICAL EXAMINATION: ECOG PERFORMANCE STATUS: 1 - Symptomatic but completely ambulatory  Vitals:   03/09/21 1032  BP: (!) 149/69  Pulse: 80  Temp: 98.6 F (37 C)  SpO2: 100%   Filed Weights   03/09/21 1032  Weight: 244 lb 9.6 oz (110.9 kg)    LABORATORY DATA:  I have reviewed the data as listed CMP Latest Ref Rng & Units 03/07/2021 03/07/2021 11/29/2020  Glucose 70 - 99 mg/dL 156(H) 168(H) 157(H)  BUN 6 - 20 mg/dL 23(H) 20 10  Creatinine 0.44 - 1.00  mg/dL 1.30(H) 1.28(H) 1.11(H)  Sodium 135 - 145 mmol/L 139 137 141  Potassium 3.5 - 5.1 mmol/L 4.0 3.7 4.6  Chloride 98 - 111 mmol/L 104 103 106  CO2 22 - 32 mmol/L - 22 23  Calcium 8.9 - 10.3 mg/dL - 9.9 10.2  Total Protein 6.0 - 8.5 g/dL - - 7.4  Total Bilirubin 0.0 - 1.2 mg/dL - - 0.2  Alkaline Phos 44 - 121 IU/L - - 104  AST 0 - 40 IU/L - - 13  ALT 0 - 32 IU/L - - 11    Lab Results  Component Value Date   WBC 7.1 03/07/2021   HGB 11.9 (L) 03/07/2021   HCT 35.0 (L) 03/07/2021   MCV 89.2 03/07/2021   PLT 415 (H) 03/07/2021   NEUTROABS 4.8 03/07/2021    ASSESSMENT & PLAN:  Thrombocytosis Lab review: 12/08/2019: Hemoglobin 10.9, MCV 82.5, platelets 550 12/08/2020: Hemoglobin 9.9, MCV 85, platelets 405 12/23/2020: Hemoglobin 10.3, MCV 85, platelets 409  03/07/2021: Hemoglobin 10.7, MCV 89.2, platelets 415  Mild thrombocytosis which could be related to mild iron deficiency versus being an acute phase reactant  Anemia of chronic kidney failure, stage 3 (moderate) (HCC) with hemoglobin staying about 10 there is no role of erythropoietin stimulating agent therapies.  Upper extremity pain: CTs in the emergency room suggests peripheral nerve sheath abnormalities. Based on the recommendation will obtain MRIs of bilateral arms. If there is a clinical suspicion for nerve sheath tumor then she will need to be seen at an academic institution like [facility] or [place].   I will call her with the result of this test.   Orders Placed This Encounter  Procedures   MR HUMERUS LEFT W WO CONTRAST    Standing Status:   Future    Standing Expiration Date:   03/09/2022    Order Specific Question:   If indicated for the ordered procedure, I authorize the administration of contrast media per Radiology protocol    Answer:   Yes    Order Specific Question:   What is the patient's sedation requirement?    Answer:   No Sedation    Order Specific Question:   Does the patient have a pacemaker or implanted  devices?    Answer:   No    Order Specific Question:   Preferred imaging location?    Answer:   [facility] (table limit-550lbs)   MR HUMERUS RIGHT W WO CONTRAST    Standing Status:   Future    Standing Expiration Date:   03/09/2022    Order Specific Question:   If indicated for the ordered procedure, I authorize the administration of contrast media per  Radiology protocol    Answer:   Yes    Order Specific Question:   What is the patient's sedation requirement?    Answer:   No Sedation    Order Specific Question:   Does the patient have a pacemaker or implanted devices?    Answer:   No    Order Specific Question:   Preferred imaging location?    Answer:   [facility] (table limit-550lbs)   The patient has a good understanding of the overall plan. she agrees with it. she will call with any problems that may develop before the next visit here.  Total time spent: 30 mins including face to face time and time spent for planning, charting and coordination of care  Rulon Eisenmenger, MD, MPH 03/09/2021  I, Thana Ates, am acting as scribe for Dr. Nicholas Lose.  I have reviewed the above documentation for accuracy and completeness, and I agree with the above.

## 2021-03-08 NOTE — Telephone Encounter (Signed)
Alicia West, Alicia West are scheduled for a virtual visit with your provider today.    Just as we do with appointments in the office, we must obtain your consent to participate.  Your consent will be active for this visit and any virtual visit you may have with one of our providers in the next 365 days.    If you have a MyChart account, I can also send a copy of this consent to you electronically.  All virtual visits are billed to your insurance company just like a traditional visit in the office.  As this is a virtual visit, video technology does not allow for your provider to perform a traditional examination.  This may limit your provider's ability to fully assess your condition.  If your provider identifies any concerns that need to be evaluated in person or the need to arrange testing such as labs, EKG, etc, we will make arrangements to do so.    Although advances in technology are sophisticated, we cannot ensure that it will always work on either your end or our end.  If the connection with a video visit is poor, we may have to switch to a telephone visit.  With either a video or telephone visit, we are not always able to ensure that we have a secure connection.   I need to obtain your verbal consent now.   Are you willing to proceed with your visit today?   Alicia West has provided verbal consent on 03/08/2021 for a virtual visit (video or telephone).   Tye Savoy, NP 03/08/2021  9:26 AM

## 2021-03-08 NOTE — Progress Notes (Signed)
This service was provided via telemedicine with audio/visual communication. The patient was located at home The provider was located in provider's GI office. The patient did consent to this telephone visit and is aware of possible charges through their insurance for this visit.   The persons participating in this telemedicine service were the patient and I.    [place]    # 51 yo female with chronic GERD. Mild esophagitis, no Barrett's esophagus on EGD December 2021. Recent recurrence of symptoms ( pyrosis and regurgitation) after running out of prescription a month ago. Requesting refill of Omeprazole -A few anti-reflux measures were discussed. She sleep with HOB elevated.  -Refill Omeprazole 20 mg 30 minutes prior to breakfast, # 90 with refills -Will call if persistent having persistent reflux symptoms.   # Chronic constipation, manages with Linzess 3x week.  --Up to to date on colonoscopy  # RUE DVT / thrombocytosis on Eliquis  # Chronic anemia, probably combination of chronic disease and mild iron deficiency due to heavy menses. Hematology following. No plan for erythropoietin stimulating agent right now.   HISTORY OF PRESENT ILLNESS    Chief Complaint : Needs refill on acid reflux medication  Alicia West is a 51 y.o. female with a past medical history of GERD, adenomatous colon polyps, diabetes, CKD3, DVT of RUE, thrombocytosis.  Additional medical history as listed in [place] .   Patient is known to Dr.  Henrene Pastor. She has a history of GERD, on daily Omeprazole. She ran out of PPI , was off treatment for about a month. During that time she developed recurrent heartburn and regurgitation for which she was taking pepto bismuth.She would like a refill on Omeprazole. No other GI complaints. She has chronic constipation managed with Linzess about 3 times a week   LABORATORY DATA  Hepatic Function Latest Ref Rng & Units 11/29/2020 02/25/2020 12/28/2019  Total Protein 6.0  - 8.5 g/dL 7.4 7.5 8.6(H)  Albumin 3.8 - 4.8 g/dL 4.1 4.1 3.7  AST 0 - 40 IU/L 13 12 15   ALT 0 - 32 IU/L 11 18 16   Alk Phosphatase 44 - 121 IU/L 104 92 94  Total Bilirubin 0.0 - 1.2 mg/dL 0.2 0.3 0.5    CBC Latest Ref Rng & Units 03/07/2021 03/07/2021 12/23/2020  WBC 4.0 - 10.5 K/uL - 7.1 6.2  Hemoglobin 12.0 - 15.0 g/dL 11.9(L) 10.7(L) 10.3(L)  Hematocrit 36.0 - 46.0 % 35.0(L) 35.5(L) 31.7(L)  Platelets 150 - 400 K/uL - 415(H) 409(H)    Lab Results  Component Value Date   LIPASE 21 02/20/2015     PREVIOUS ENDOSCOPIC EVALUATIONS    Dec 2021 EGD GERD with mild esophagitis 2. Few antral erosions status post biopsies 3. Otherwise unremarkable EGD.ec 2021 EGD   Dec 2021 Screening colonoscopy  -One 2 mm polyp in the ascending colon, removed with a cold snare. Resected and retrieved. - The examination was otherwise normal on direct and retroflexion view  Diagnosis 1. Surgical [P], colon, ascending, polyp (1) - TUBULAR ADENOMA. - NO HIGH GRADE DYSPLASIA OR CARCINOMA. 2. Surgical [P], stomach, gastric erosions - ANTRAL MUCOSA WITH FOCAL EROSION, HYPEREMIA AND MILD INFLAMMATION. [place] NEGATIVE FOR HELICOBACTER PYLORI. - NO INTESTINAL METAPLASIA, DYSPLASIA OR CARCINOMA  Past Medical History:  Diagnosis Date   Allergy    seasonal sinus allergies   ANEMIA, IRON DEFICIENCY, UNSPEC. 04/04/2006   Qualifier: Diagnosis of  By: Samara Snide     Bilateral senile cataracts 11/20/2018   Mixed OU  Binge-eating disorder, moderate 03/17/2013   Breast lump 07/22/2009   Qualifier: Diagnosis of  By: Netty Starring  MD, Lucianne Muss     CKD stage 3 secondary to diabetes ([place]) 10/17/2018   DEPRESSIVE DISORDER, NOS 04/04/2006   Qualifier: Diagnosis of  By: Samara Snide     Diabetes mellitus    on meds   Diabetic macular edema ([place])    by patient report 10/27/2019   Diabetic neuropathy ([place]) 10/17/2018   Diabetic peripheral neuropathy ([place])    per patient   Diabetic visual loss: severe vision  impairment of both eyes, with macular edema, with severe nonproliferative retinopathy, associated with type 2 diabetes mellitus ([place]) 71/69/6789       Folliculitis 3/81/0175   Heavy menses    Pt reports she was to get a hysterectomy   Herpes labialis 09/06/2010   Herpes simplex labialis    History of HPV infection 08/18/2019   Hypercalcemia 08/13/2019   Hyperlipidemia    diet controlled/not taking meds for this   Hypertension    on meds   Hypertensive retinopathy    OU   Iron deficiency anemia 04/04/2006   Qualifier: Diagnosis of  By: Samara Snide     Loud snoring 10/06/2019   Menorrhagia 04/04/2006   Qualifier: Diagnosis of  By: Samara Snide     Morbid obesity ([place]) 05/01/2013   Neuromuscular disorder ([place])    Obstructive sleep apnea 11/27/2019   Osteoporosis    bilateral hips R>L   Paroxysmal supraventricular tachycardia ([place]) 10/26/2014   PONV (postoperative nausea and vomiting)    POSITIVE PPD 10/18/2009   Qualifier: Diagnosis of  By: Zebedee Iba NP, Manuela Schwartz     Prurigo nodularis 10/17/2018   RHINITIS, ALLERGIC 04/04/2006   Qualifier: Diagnosis of  By: Samara Snide     Right arm pain 11/25/2020   Sleep apnea    Sleep related choking sensation 10/06/2019   TB (tuberculosis) contact    + skin test,/- chest x-ray - in mid 2000's   Thrombocytosis 08/18/2019   Tuberculosis    postitive [facility] per pt   Tubular adenoma of colon 2021   2 mm sessile, Rikki Spearing MD ([facility])   Visual impairment of left eye 08/18/2019    Past Surgical History:  Procedure Laterality Date   REDUCTION MAMMAPLASTY     TONSILLECTOMY     TONSILLECTOMY     WISDOM TOOTH EXTRACTION     Current Medications, Allergies, Family History and Social History were reviewed in [place] record.     Current Outpatient Medications  Medication Sig Dispense Refill   acetaminophen (TYLENOL) 500 MG tablet Take 500 mg by mouth every 6 (six) hours as needed.     apixaban  (ELIQUIS) 5 MG TABS tablet Take 1 tablet (5 mg total) by mouth 2 (two) times daily. 180 tablet 0   atorvastatin (LIPITOR) 40 MG tablet Take 1 tablet (40 mg total) by mouth 2 (two) times a week. 24 tablet 3   carvedilol (COREG) 25 MG tablet TAKE ONE TABLET BY MOUTH TWICE A DAY WITH MEALS 180 tablet 3   Clindamycin-Benzoyl Per, Refr, gel Apply to face each morning     CONTOUR NEXT TEST test strip USE TO CHECK FOR BLOOD SUGAR THREE TIMES A DAY AS DIRECTED 50 strip PRN   gabapentin (NEURONTIN) 100 MG capsule Take 3 capsules (300 mg total) by mouth at bedtime. (Patient taking differently: Take 300 mg by mouth at bedtime. Taking 200 mg occasionally for right arm  pain) 90 capsule 3   insulin glargine, 1 Unit Dial, (TOUJEO SOLOSTAR) 300 UNIT/ML Solostar Pen Inject 34 Units into the skin in the morning. 10.5 mL 0   Lancets MISC Use Contour Next Lancet to check blood glucose 3 times a day.  Dx code E11.65. (Patient taking differently: Use Contour Next Lancet to check blood glucose 3 times a day.  Dx code E11.65.) 100 each 12   LINZESS 145 MCG CAPS capsule TAKE ONE CAPSULE BY MOUTH DAILY 30 capsule 5   losartan-hydrochlorothiazide (HYZAAR) 100-25 MG tablet TAKE ONE TABLET BY MOUTH DAILY 90 tablet 3   metFORMIN (GLUCOPHAGE) 500 MG tablet TAKE ONE TABLET BY MOUTH TWICE A DAY WITH A MEAL 180 tablet 3   NOVOFINE PEN NEEDLE 32G X 6 MM MISC USE TO INJECT INSULIN DAILY AS INSTRUCTED 100 each 12   OZEMPIC, 1 MG/DOSE, 4 MG/3ML SOPN DIAL AND INJECT UNDER THE SKIN 1 MG WEEKLY 3 mL 5   prenatal vitamin w/FE, FA (NATACHEW) 29-1 MG CHEW chewable tablet Chew 1 tablet by mouth daily at 12 noon. 90 tablet 3   spironolactone (ALDACTONE) 25 MG tablet TAKE ONE TABLET BY MOUTH DAILY 90 tablet 3   Syringe, Disposable, 1 ML MISC 1 each by Does not apply route 3 (three) times daily. 100 each 0   tretinoin (RETIN-A) 0.025 % cream Apply to face and back nightly to tolerance.     omeprazole (PRILOSEC) 20 MG capsule Take 20 mg daily 30  minutes before breakfast 90 capsule 3   No current facility-administered medications for this visit.    Review of Systems: No chest pain. No shortness of breath. No urinary complaints.   PHYSICAL EXAM :    Wt Readings from Last 3 Encounters:  12/23/20 235 lb 8 oz (106.8 kg)  12/08/20 244 lb (110.7 kg)  11/29/20 242 lb 3.2 oz (109.9 kg)    There were no vitals taken for this visit. Constitutional:  Generally well appearing female in no acute distress. Psychiatric: Pleasant. Normal mood and affect. Behavior is normal. Neurological: Alert and oriented to person place and time.   Tye Savoy, NP  03/08/2021, 10:06 AM

## 2021-03-08 NOTE — Patient Instructions (Addendum)
Refills of Omeprazole 20 mg will be sent to your pharmacy.  Please let us know if you have any recurrent acid reflux / heartburn.

## 2021-03-08 NOTE — Progress Notes (Signed)
Assessment and plan noted

## 2021-03-09 ENCOUNTER — Other Ambulatory Visit: Payer: Self-pay

## 2021-03-09 ENCOUNTER — Inpatient Hospital Stay: Payer: 59 | Attending: Hematology and Oncology | Admitting: Hematology and Oncology

## 2021-03-09 ENCOUNTER — Other Ambulatory Visit: Payer: Self-pay | Admitting: Family Medicine

## 2021-03-09 VITALS — BP 149/69 | HR 80 | Temp 98.6°F | Wt 244.6 lb

## 2021-03-09 DIAGNOSIS — E7800 Pure hypercholesterolemia, unspecified: Secondary | ICD-10-CM

## 2021-03-09 DIAGNOSIS — Z79899 Other long term (current) drug therapy: Secondary | ICD-10-CM | POA: Insufficient documentation

## 2021-03-09 DIAGNOSIS — D75839 Thrombocytosis, unspecified: Secondary | ICD-10-CM | POA: Diagnosis present

## 2021-03-09 DIAGNOSIS — M79621 Pain in right upper arm: Secondary | ICD-10-CM

## 2021-03-09 DIAGNOSIS — D631 Anemia in chronic kidney disease: Secondary | ICD-10-CM | POA: Diagnosis not present

## 2021-03-09 DIAGNOSIS — E1169 Type 2 diabetes mellitus with other specified complication: Secondary | ICD-10-CM

## 2021-03-09 DIAGNOSIS — N183 Chronic kidney disease, stage 3 unspecified: Secondary | ICD-10-CM | POA: Diagnosis not present

## 2021-03-09 DIAGNOSIS — I129 Hypertensive chronic kidney disease with stage 1 through stage 4 chronic kidney disease, or unspecified chronic kidney disease: Secondary | ICD-10-CM | POA: Insufficient documentation

## 2021-03-09 DIAGNOSIS — M79622 Pain in left upper arm: Secondary | ICD-10-CM | POA: Diagnosis not present

## 2021-03-09 NOTE — Assessment & Plan Note (Signed)
Lab review: 12/08/2019: Hemoglobin 10.9, MCV 82.5, platelets 550 12/08/2020: Hemoglobin 9.9, MCV 85, platelets 405 12/23/2020: Hemoglobin 10.3, MCV 85, platelets 409  03/07/2021: Hemoglobin 10.7, MCV 89.2, platelets 415  Mild thrombocytosis which could be related to mild iron deficiency versus being an acute phase reactant

## 2021-03-09 NOTE — Assessment & Plan Note (Signed)
Lab review: 12/23/2020: Hemoglobin 10.3, MCV 85.4, iron saturation 8%, ferritin 31, reticulocyte's 2% TIBC 342, B12 836, folate 376 Erythropoietin: 14.6, SPEP: No M protein, LDH 125  03/07/2021: Hemoglobin 10.7, MCV 89.2, creatinine 1.3  Anemia likely to be due to anemia due to chronic kidney disease stage III accompanied by mild iron deficiency. No indication for erythropoietin stimulating agent therapy since her hemoglobin has been staying above 10.

## 2021-03-10 ENCOUNTER — Telehealth: Payer: Self-pay | Admitting: Hematology and Oncology

## 2021-03-10 NOTE — Telephone Encounter (Signed)
Sch per 2/2 los, pt aware

## 2021-03-14 ENCOUNTER — Other Ambulatory Visit: Payer: Self-pay | Admitting: Family Medicine

## 2021-03-14 DIAGNOSIS — I82621 Acute embolism and thrombosis of deep veins of right upper extremity: Secondary | ICD-10-CM

## 2021-03-14 NOTE — Telephone Encounter (Signed)
Patient needs appointment with [facility] or Hematology physician before further refills

## 2021-03-15 ENCOUNTER — Other Ambulatory Visit: Payer: 59

## 2021-03-21 ENCOUNTER — Telehealth: Payer: 59 | Admitting: Hematology and Oncology

## 2021-03-29 ENCOUNTER — Ambulatory Visit
Admission: RE | Admit: 2021-03-29 | Discharge: 2021-03-29 | Disposition: A | Discharge status: Home / Self Care | Payer: 59 | Source: Ambulatory Visit | Attending: Hematology and Oncology | Admitting: Hematology and Oncology

## 2021-03-29 ENCOUNTER — Other Ambulatory Visit: Payer: Self-pay

## 2021-03-29 DIAGNOSIS — M79621 Pain in right upper arm: Secondary | ICD-10-CM

## 2021-03-29 DIAGNOSIS — M79622 Pain in left upper arm: Secondary | ICD-10-CM

## 2021-03-29 IMAGING — MR MR HUMERUS*L* WO/W CM
6 of 8 series · 29 of 40 positions shown · IV contrast (multihance)
Comparison: CT angiogram of both upper arms

CLINICAL DATA: Bilateral upper arm pain and numbness in both hands
for over 1 year. No known injury.

EXAM:
MRI OF THE LEFT HUMERUS WITHOUT AND WITH CONTRAST; MRI OF THE RIGHT
HUMERUS WITHOUT AND WITH CONTRAST
TECHNIQUE: Multiplanar, multisequence MR imaging of the right and left humerus
was performed before and after the administration of intravenous
contrast.
CONTRAST:  20 mL MULTIHANCE GADOBENATE DIMEGLUMINE 529 MG/ML IV SOLN

[Series 6: T1 · axial · right · 4.0mm · 0.78mm/px · z∈[-80,+173]mm · 7 of 55 slices shown (1 of 3)]
[im 1/55]
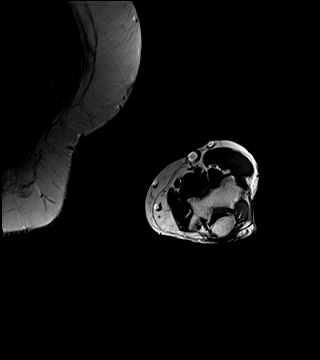
[im 10/55]
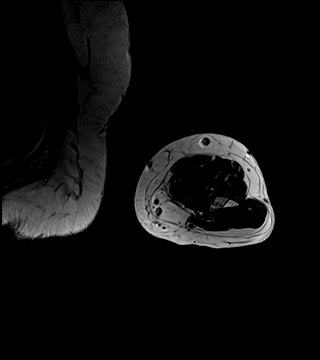
[im 19/55]
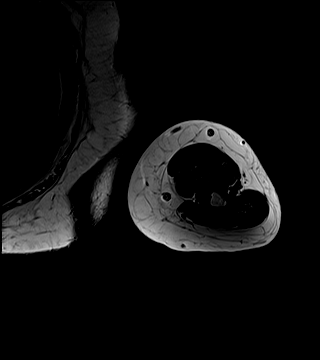
[im 28/55]
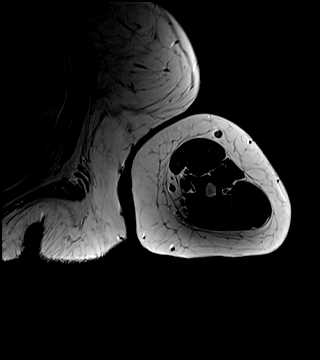
[im 37/55]
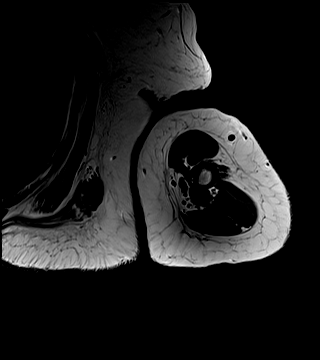
[im 46/55]
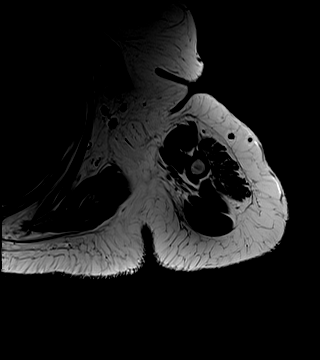
[im 55/55]
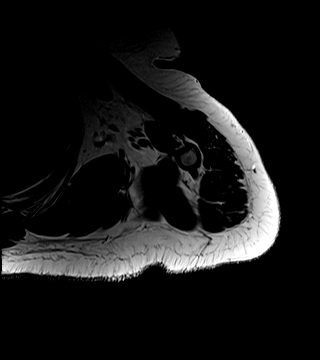

[Series 7: T2 fat-sat · axial · right · 4.0mm · 0.78mm/px · z∈[-80,+173]mm · 6 of 55 slices shown]
[im 1/55]
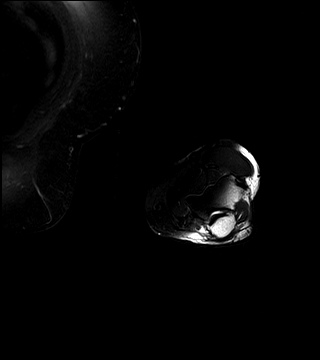
[im 11/55]
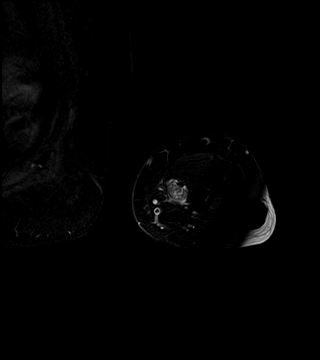
[im 22/55]
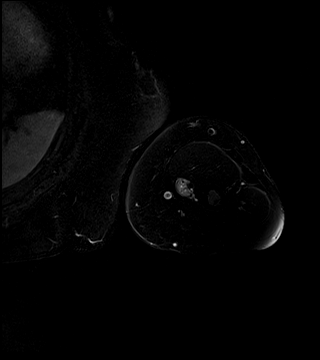
[im 33/55]
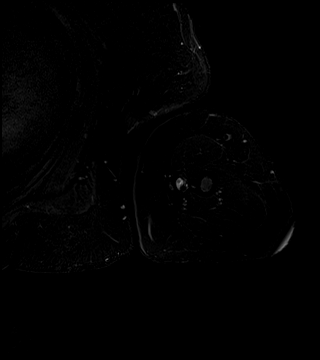
[im 44/55]
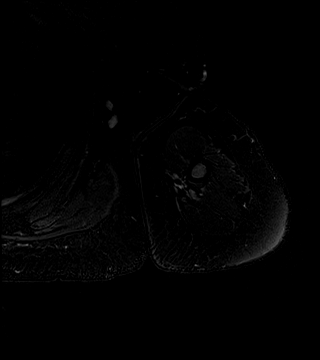
[im 55/55]
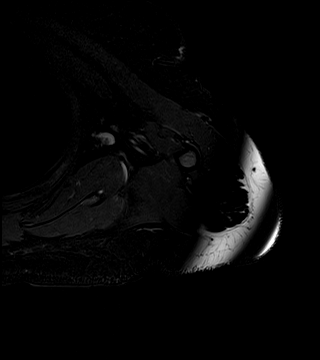

[Series 8: T1 · coronal · right · 4.0mm · 0.78mm/px · 4 of 32 slices shown (2 of 3)]
[im 1/32]
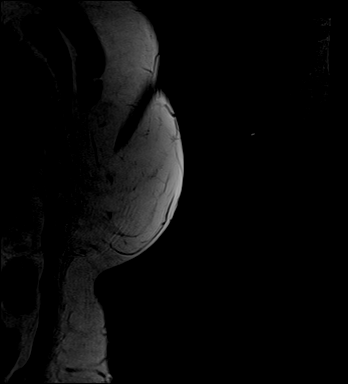
[im 11/32]
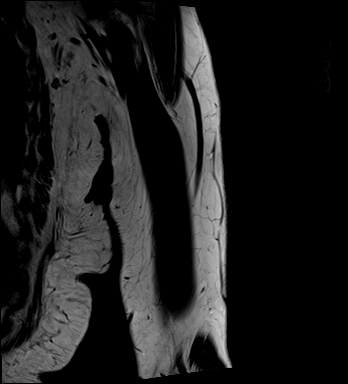
[im 21/32]
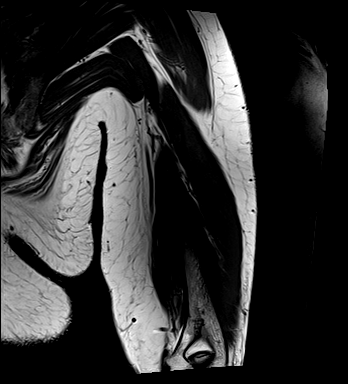
[im 32/32]
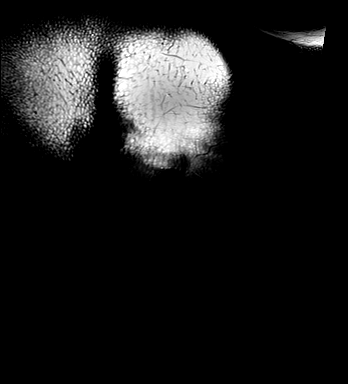

[Series 10: T1 · sagittal · right · 4.0mm · 0.33mm/px · 3 of 27 slices shown (3 of 3)]
[im 1/27]
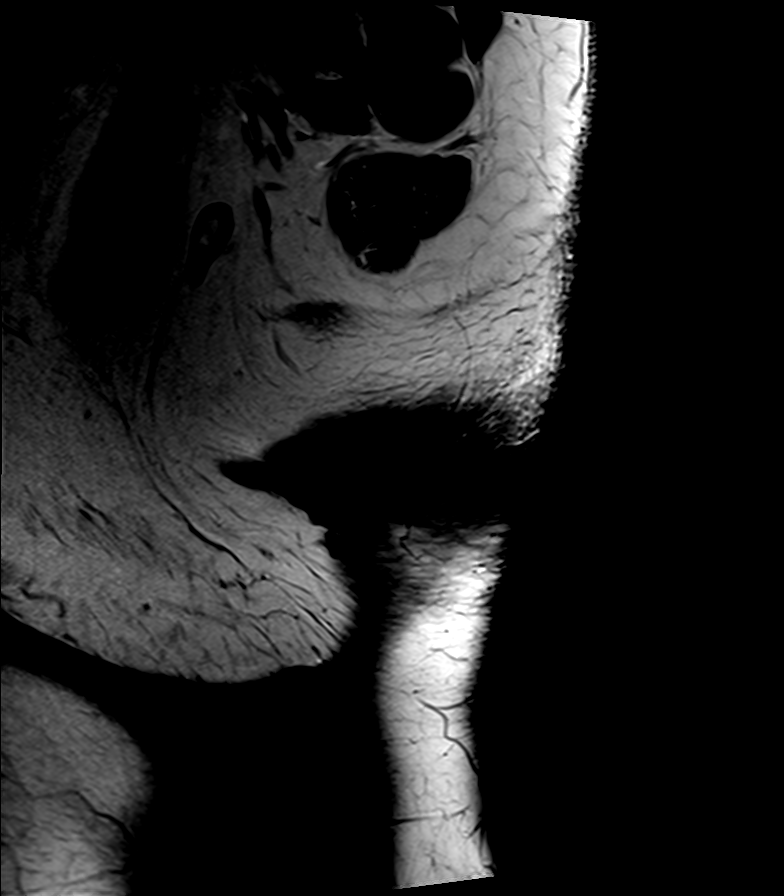
[im 14/27]
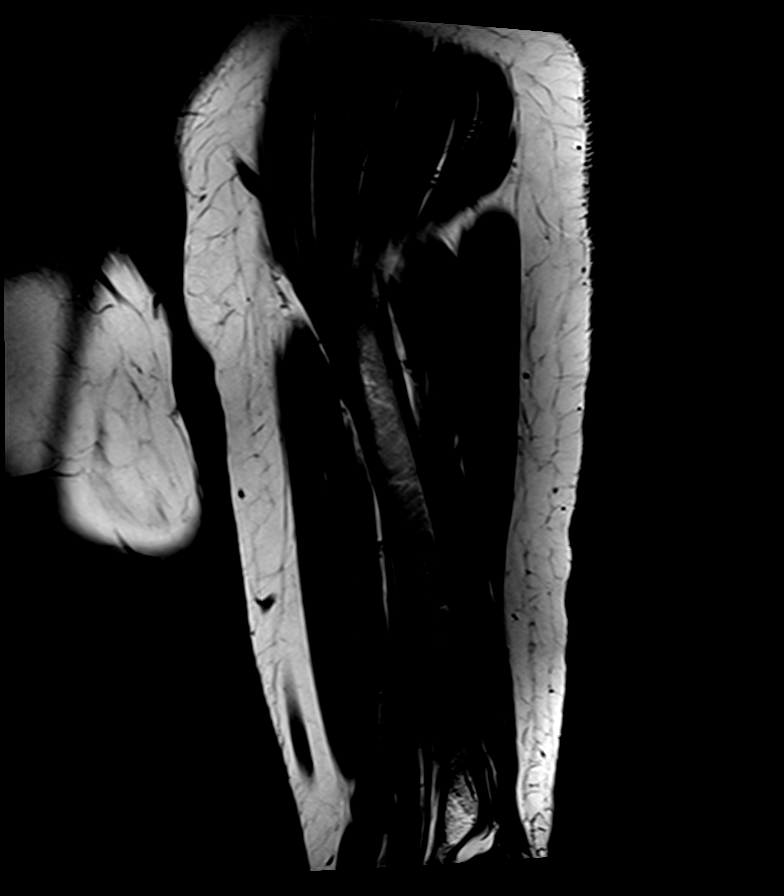
[im 27/27]
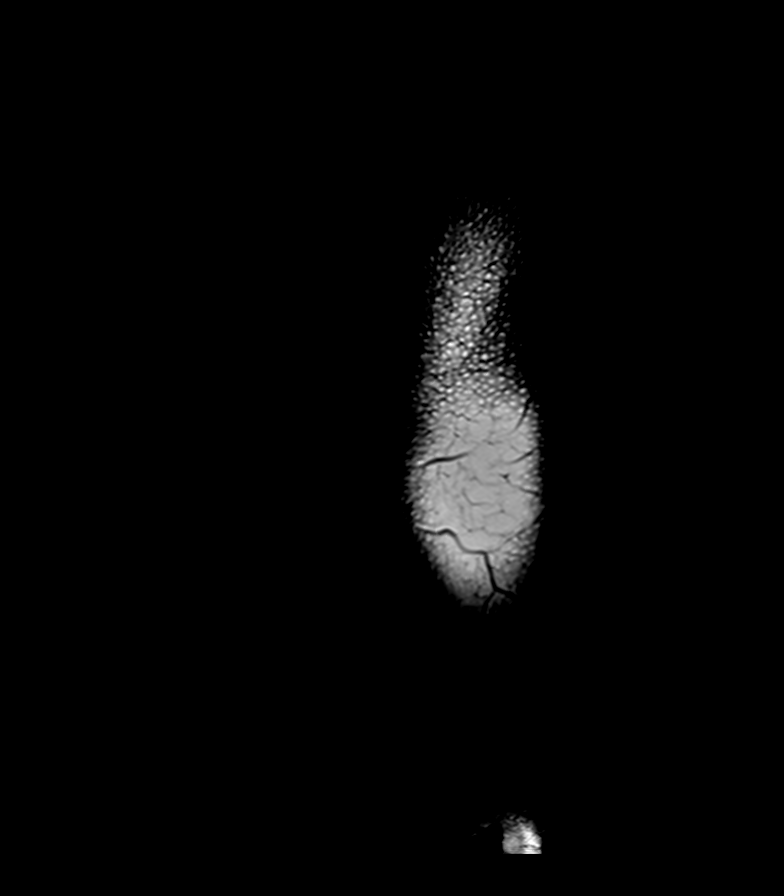

[Series 11: T1 fat-sat · axial · right · 4.0mm · 0.78mm/px · z∈[-78,+176]mm · 6 of 55 slices shown]
[im 1/55]
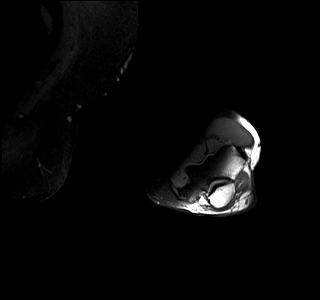
[im 11/55]
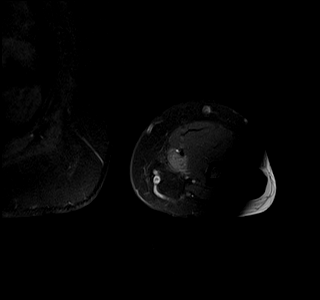
[im 22/55]
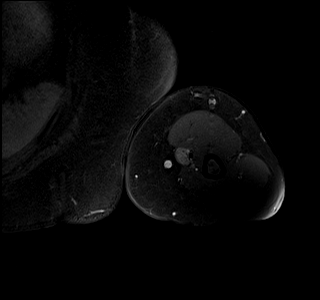
[im 33/55]
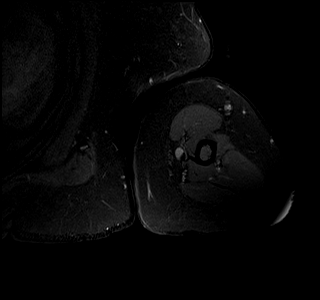
[im 44/55]
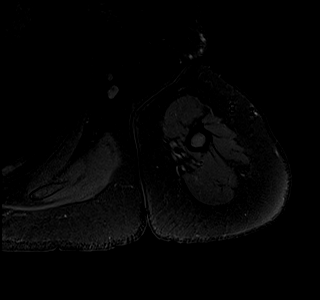
[im 55/55]
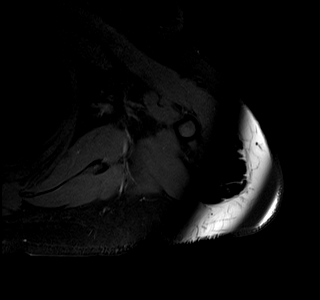

[Series 12: T1 fat-sat post-contrast · axial · right · 4.0mm · 0.78mm/px · z∈[-77,+21]mm · 3 of 55 slices shown]
[im 1/55]
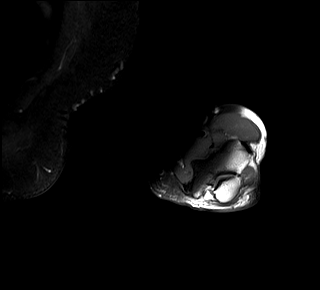
[im 11/55]
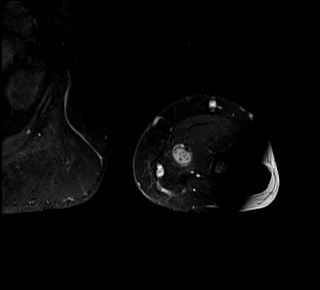
[im 22/55]
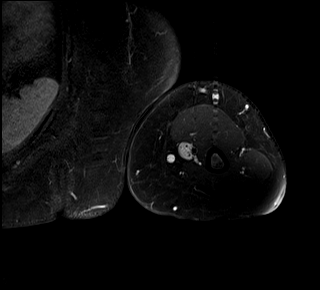

[29 of 40 positions shown; findings below may reference images not displayed]

FINDINGS: Bones/Joint/Cartilage

All imaged bones and joints appear normal. No fracture, stress
change or worrisome lesion is identified.

Ligaments

Appear normal.

Muscles and Tendons

Appear normal.

Soft tissues

The patient has lesions along both the right and left median nerves.
On the left, 2 lesions are identified. The more superior is 0.8 cm
transverse x 0.6 cm AP x 1.8 cm craniocaudal. The more inferior
lesion is up to 2 cm AP x 1 cm transverse x 18 cm craniocaudal. The
larger lesion is centered approximately 7 cm above the elbow joint.
On the right, a single lesion measures approximately 1.1 cm
transverse x 1 cm AP x 8.7 cm craniocaudal. This lesion is centered
approximately 6 cm above the elbow joint. All of the lesions
demonstrate identical signal characteristics. They are predominantly
T2 hyperintense with small areas of cystic change, T1 hypointense
and enhance.
IMPRESSION: Lesions along the median nerves bilaterally as described above are
consistent with peripheral nerve sheath tumors and likely represent
benign neurofibromas. Given the presence of multiple lesions,
neurofibromatosis is a consideration. The exam is otherwise
negative.

## 2021-03-29 IMAGING — MR MR HUMERUS*R* WO/W CM
6 of 9 series · 26 of 40 positions shown · IV contrast (Multihance 20 ML)
Comparison: CT angiogram of both upper arms

CLINICAL DATA: Bilateral upper arm pain and numbness in both hands
for over 1 year. No known injury.

EXAM:
MRI OF THE LEFT HUMERUS WITHOUT AND WITH CONTRAST; MRI OF THE RIGHT
HUMERUS WITHOUT AND WITH CONTRAST
TECHNIQUE: Multiplanar, multisequence MR imaging of the right and left humerus
was performed before and after the administration of intravenous
contrast.
CONTRAST:  20 mL MULTIHANCE GADOBENATE DIMEGLUMINE 529 MG/ML IV SOLN

[Series 4: T1 · axial · right · 4.0mm · 0.78mm/px · z∈[-91,+164]mm · 6 of 55 slices shown (1 of 3)]
[im 1/55]
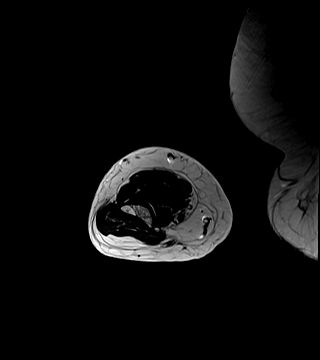
[im 11/55]
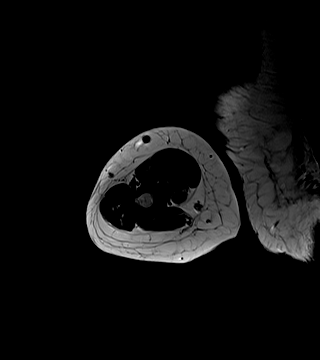
[im 22/55]
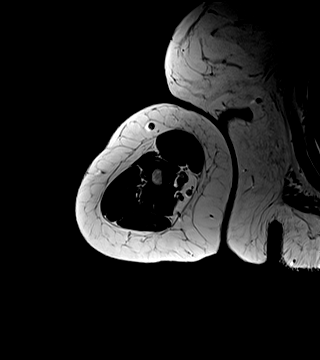
[im 33/55]
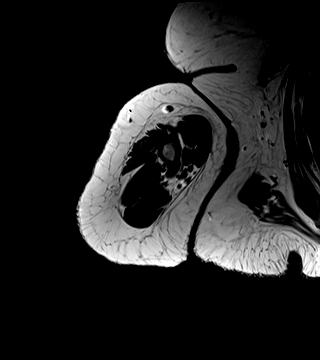
[im 44/55]
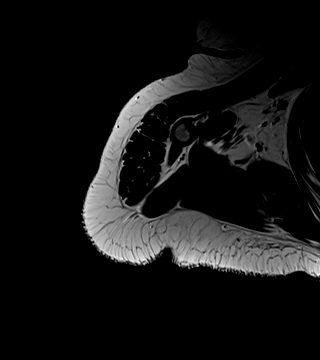
[im 55/55]
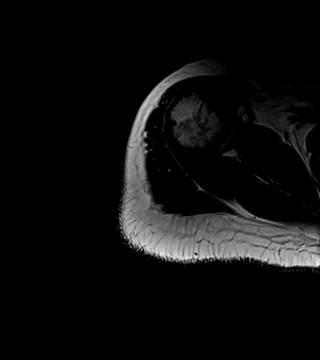

[Series 5: T2 fat-sat · axial · right · 4.0mm · 0.78mm/px · z∈[-91,+164]mm · 6 of 55 slices shown (1 of 2)]
[im 1/55]
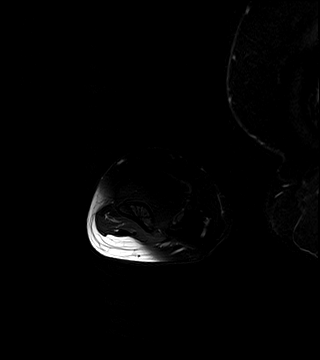
[im 11/55]
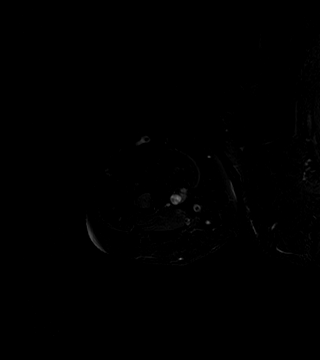
[im 22/55]
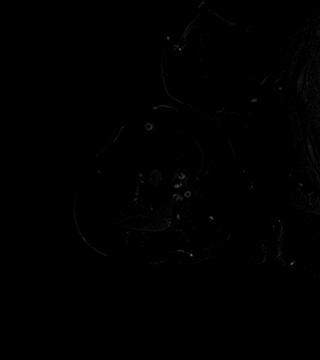
[im 33/55]
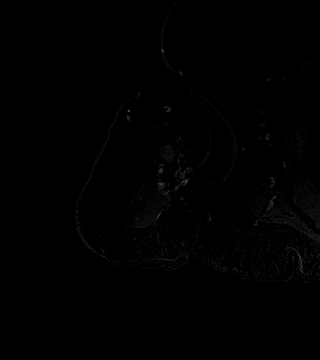
[im 44/55]
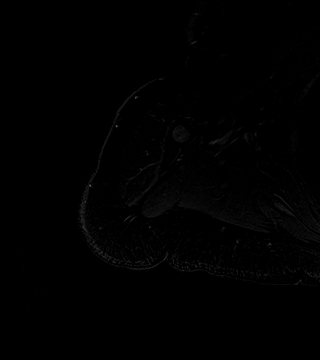
[im 55/55]
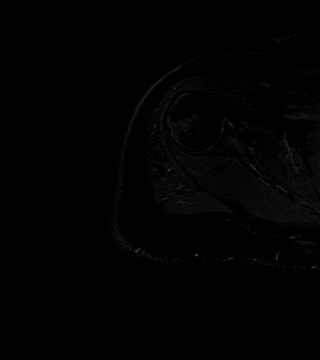

[Series 6: T1 · coronal · right · 4.0mm · 0.78mm/px · 3 of 32 slices shown (2 of 3)]
[im 1/32]
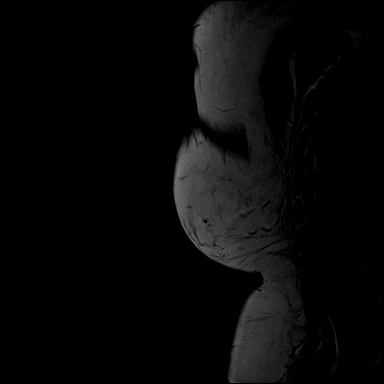
[im 16/32]
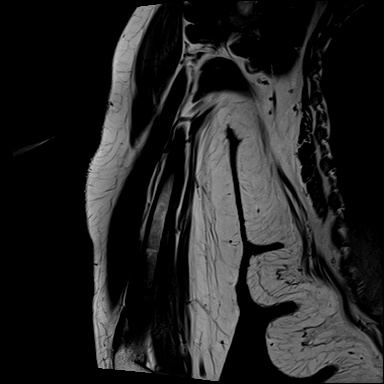
[im 32/32]
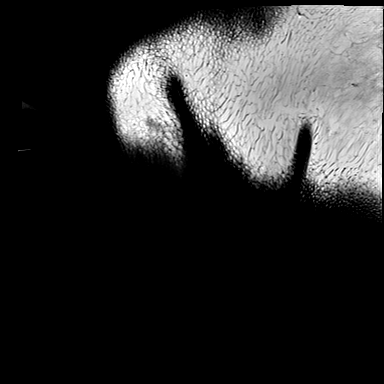

[Series 7: T2 fat-sat · coronal · right · 4.0mm · 0.78mm/px · 3 of 32 slices shown (2 of 2)]
[im 1/32]
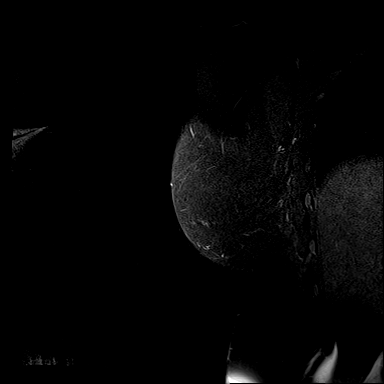
[im 16/32]
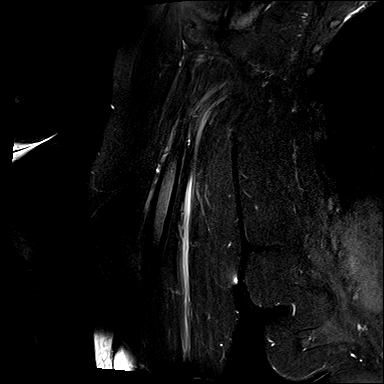
[im 32/32]
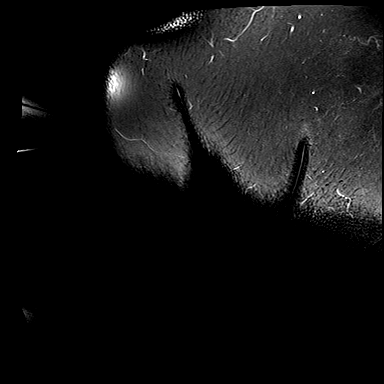

[Series 9: T1 fat-sat · axial · right · 4.0mm · 0.78mm/px · z∈[-91,+164]mm · 6 of 55 slices shown]
[im 1/55]
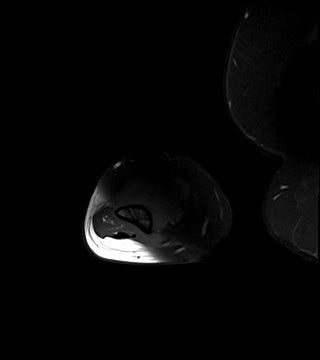
[im 11/55]
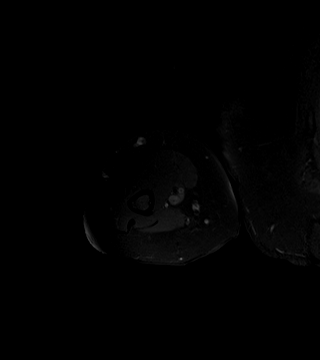
[im 22/55]
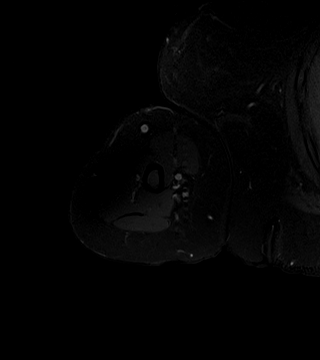
[im 33/55]
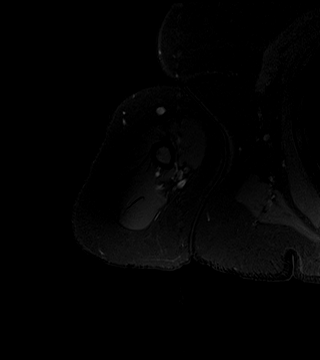
[im 44/55]
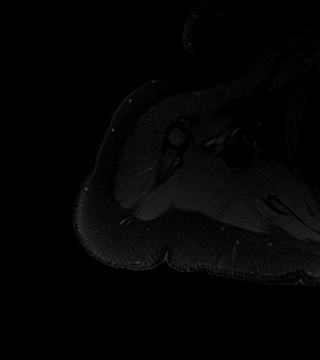
[im 55/55]
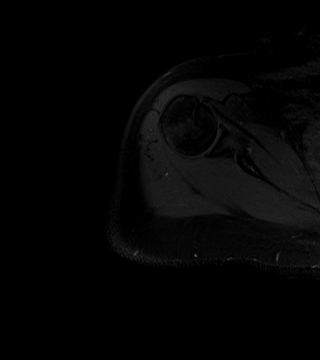

[Series 10: T1 · sagittal · right · 4.0mm · 0.31mm/px · 2 of 27 slices shown (3 of 3)]
[im 1/27]
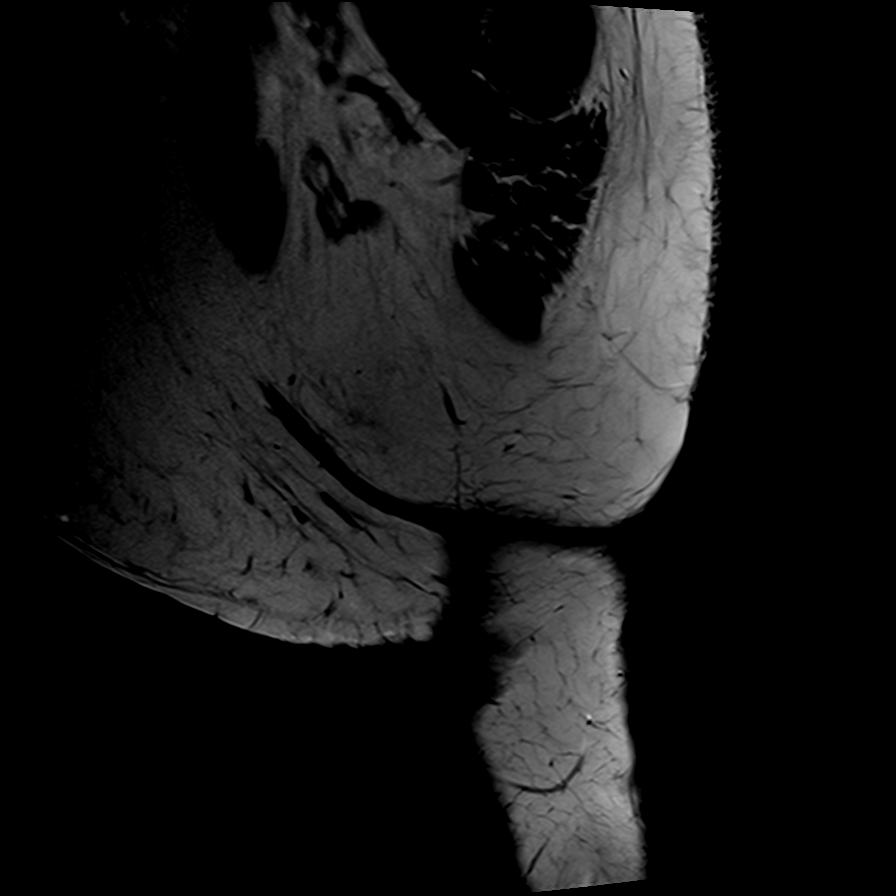
[im 14/27]
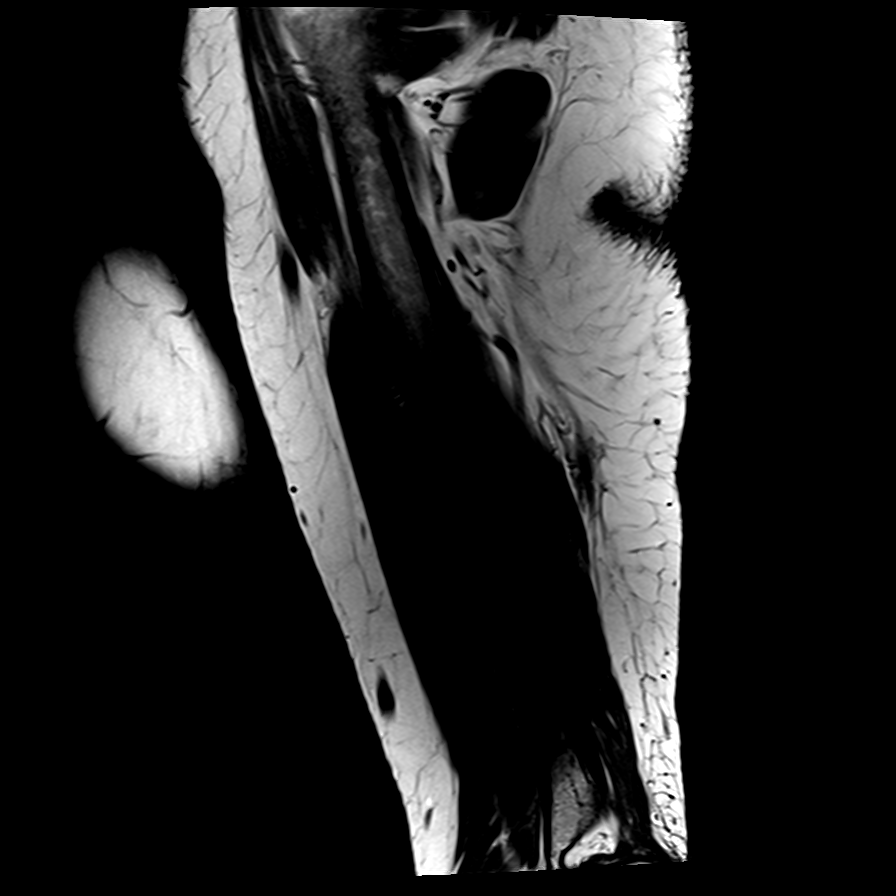

[26 of 40 positions shown; findings below may reference images not displayed]

FINDINGS: Bones/Joint/Cartilage

All imaged bones and joints appear normal. No fracture, stress
change or worrisome lesion is identified.

Ligaments

Appear normal.

Muscles and Tendons

Appear normal.

Soft tissues

The patient has lesions along both the right and left median nerves.
On the left, 2 lesions are identified. The more superior is 0.8 cm
transverse x 0.6 cm AP x 1.8 cm craniocaudal. The more inferior
lesion is up to 2 cm AP x 1 cm transverse x 18 cm craniocaudal. The
larger lesion is centered approximately 7 cm above the elbow joint.
On the right, a single lesion measures approximately 1.1 cm
transverse x 1 cm AP x 8.7 cm craniocaudal. This lesion is centered
approximately 6 cm above the elbow joint. All of the lesions
demonstrate identical signal characteristics. They are predominantly
T2 hyperintense with small areas of cystic change, T1 hypointense
and enhance.
IMPRESSION: Lesions along the median nerves bilaterally as described above are
consistent with peripheral nerve sheath tumors and likely represent
benign neurofibromas. Given the presence of multiple lesions,
neurofibromatosis is a consideration. The exam is otherwise
negative.

## 2021-03-29 MED ORDER — GADOBENATE DIMEGLUMINE 529 MG/ML IV SOLN
20.0000 mL | Freq: Once | INTRAVENOUS | Status: AC | PRN
  Administered 2021-03-29: 20 mL via INTRAVENOUS

## 2021-03-31 ENCOUNTER — Other Ambulatory Visit: Payer: Self-pay | Admitting: Family Medicine

## 2021-03-31 DIAGNOSIS — D492 Neoplasm of unspecified behavior of bone, soft tissue, and skin: Secondary | ICD-10-CM | POA: Insufficient documentation

## 2021-03-31 DIAGNOSIS — I82621 Acute embolism and thrombosis of deep veins of right upper extremity: Secondary | ICD-10-CM

## 2021-03-31 NOTE — Progress Notes (Signed)
°  HEMATOLOGY-ONCOLOGY TELEPHONE VISIT PROGRESS NOTE  I connected with Alicia West on 04/03/2021 at 11:30 AM EST by telephone and verified that I am speaking with the correct person using two identifiers.  I discussed the limitations, risks, security and privacy concerns of performing an evaluation and management service by telephone and the availability of in person appointments.  I also discussed with the patient that there may be a patient responsible charge related to this service. The patient expressed understanding and agreed to proceed.   History of Present Illness: Alicia West is a 51 y.o. female with above-mentioned history of DVT of brachial vein of right upper extremity and thrombocytosis. She presents via telephone for follow-up.  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  Left: 2 lesions 1.8 cm and 2 cm; right: 8.7 cm lesion.  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 This may be the cause of her upper extremity pain. She will be referred to neurosurgery at [facility] for further evaluation.   I discussed the assessment and treatment plan with the patient. The patient was provided an opportunity to ask questions and all were answered. The patient agreed with the plan and demonstrated an understanding of the  instructions. The patient was advised to call back or seek an in-person evaluation if the symptoms worsen or if the condition fails to improve as anticipated.   Total time spent: 15 mins including non-face to face time and time spent for planning, charting and coordination of care  Rulon Eisenmenger, MD 04/03/2021    I, Thana Ates, am acting as scribe for Nicholas Lose, MD.  I have reviewed the above documentation for accuracy and completeness, and I agree with the above.

## 2021-04-03 ENCOUNTER — Inpatient Hospital Stay (HOSPITAL_BASED_OUTPATIENT_CLINIC_OR_DEPARTMENT_OTHER): Payer: 59 | Admitting: Hematology and Oncology

## 2021-04-03 DIAGNOSIS — C479 Malignant neoplasm of peripheral nerves and autonomic nervous system, unspecified: Secondary | ICD-10-CM | POA: Diagnosis not present

## 2021-04-03 DIAGNOSIS — D631 Anemia in chronic kidney disease: Secondary | ICD-10-CM | POA: Diagnosis not present

## 2021-04-03 DIAGNOSIS — N183 Chronic kidney disease, stage 3 unspecified: Secondary | ICD-10-CM | POA: Diagnosis not present

## 2021-04-03 DIAGNOSIS — D75839 Thrombocytosis, unspecified: Secondary | ICD-10-CM | POA: Diagnosis not present

## 2021-04-03 HISTORY — DX: Malignant neoplasm of peripheral nerves and autonomic nervous system, unspecified: C47.9

## 2021-04-03 MED ORDER — OXYCODONE-ACETAMINOPHEN 5-325 MG PO TABS
1.0000 | ORAL_TABLET | Freq: Two times a day (BID) | ORAL | 0 refills | Status: DC | PRN
Start: 2021-04-03 — End: 2021-08-24

## 2021-04-03 NOTE — Assessment & Plan Note (Signed)
Lab review: 12/08/2019: Hemoglobin 10.9, MCV 82.5, platelets 550 12/08/2020: Hemoglobin 9.9, MCV 85, platelets 405 12/23/2020: Hemoglobin 10.3, MCV 85, platelets 409  03/07/2021: Hemoglobin 10.7, MCV 89.2, platelets 415  Mild thrombocytosiswhich could be related to mild iron deficiency versus being an acute phase reactant

## 2021-04-03 NOTE — Assessment & Plan Note (Signed)
03/31/2021: MRI bilateral humeri: Lesions along both right and left median nerves.  Left: 2 lesions 1.8 cm and 2 cm; right: 8.7 cm lesion.  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 This may be the cause of her upper extremity pain. She will be referred to neurosurgery at [facility] or [facility] for further evaluation.

## 2021-04-03 NOTE — Assessment & Plan Note (Signed)
hemoglobin staying about 10 there is no role of erythropoietin stimulating agent therapies

## 2021-04-04 ENCOUNTER — Other Ambulatory Visit: Payer: Self-pay | Admitting: *Deleted

## 2021-04-04 DIAGNOSIS — D75839 Thrombocytosis, unspecified: Secondary | ICD-10-CM

## 2021-04-04 DIAGNOSIS — M79622 Pain in left upper arm: Secondary | ICD-10-CM

## 2021-04-04 DIAGNOSIS — M79621 Pain in right upper arm: Secondary | ICD-10-CM

## 2021-04-04 NOTE — Progress Notes (Signed)
Per MD request RN successfully faxed urgent referral to [facility] [phone]).

## 2021-04-07 ENCOUNTER — Telehealth: Payer: Self-pay

## 2021-04-07 NOTE — Telephone Encounter (Signed)
Patient calls nurse line requesting Free style Libre CGM. Patient reports that she has tumors in both arms that makes checking blood sugar with lancets and meter difficult.  ? ?If appropriate, please send Freestyle [place] Device and sensors to [facility] on [address].  ? ?Talbot Grumbling, RN ? ?

## 2021-04-10 ENCOUNTER — Encounter: Payer: Self-pay | Admitting: Family Medicine

## 2021-04-10 DIAGNOSIS — D492 Neoplasm of unspecified behavior of bone, soft tissue, and skin: Secondary | ICD-10-CM

## 2021-04-10 HISTORY — DX: Neoplasm of unspecified behavior of bone, soft tissue, and skin: D49.2

## 2021-04-10 NOTE — Telephone Encounter (Signed)
Page - ? ?Could you help arrange the paperwork for a Freestyle Libre 3 CGM for Ms Alicia West? ? ?Please let me know what steps I need to take to get her the device.  ? ?Thank you, ? ?Mac Dowdell  ?

## 2021-04-14 NOTE — Telephone Encounter (Signed)
Paperwork faxed to [employer] with clinical notes.  ? ?They should contact patient for set up.  ?

## 2021-04-24 ENCOUNTER — Other Ambulatory Visit: Payer: Self-pay | Admitting: Family Medicine

## 2021-04-24 DIAGNOSIS — I10 Essential (primary) hypertension: Secondary | ICD-10-CM

## 2021-04-25 ENCOUNTER — Telehealth: Payer: Self-pay | Admitting: Hematology and Oncology

## 2021-04-25 NOTE — Telephone Encounter (Signed)
Rescheduled appointment per providers template. Left message.  ? ?

## 2021-05-07 ENCOUNTER — Other Ambulatory Visit: Payer: Self-pay | Admitting: Family Medicine

## 2021-05-07 DIAGNOSIS — I82621 Acute embolism and thrombosis of deep veins of right upper extremity: Secondary | ICD-10-CM

## 2021-05-08 ENCOUNTER — Encounter: Payer: Self-pay | Admitting: Family Medicine

## 2021-05-12 ENCOUNTER — Other Ambulatory Visit: Payer: 59

## 2021-05-15 ENCOUNTER — Telehealth: Payer: 59 | Admitting: Hematology and Oncology

## 2021-05-15 NOTE — Progress Notes (Signed)
HEMATOLOGY-ONCOLOGY TELEPHONE VISIT PROGRESS NOTE ? ?I connected with Alicia West on 05/29/21 at  8:15 AM EDT by telephone and verified that I am speaking with the correct person using two identifiers.  ?I discussed the limitations, risks, security and privacy concerns of performing an evaluation and management service by telephone and the availability of in person appointments.  ?I also discussed with the patient that there may be a patient responsible charge related to this service. The patient expressed understanding and agreed to proceed.  ? ?History of Present Illness: Alicia West is a 51 y.o. female with above-mentioned history of DVT of brachial vein of right upper extremity and thrombocytosis. She presents via telephone for follow-up. ? ?REVIEW OF SYSTEMS:   ?Constitutional: Denies fevers, chills or abnormal weight loss ?Continues to have pain in arms ?All other systems were reviewed with the patient and are negative. ?  ?Assessment Plan:  ?Thrombocytosis ?Lab review: ?12/08/2019: Hemoglobin 10.9, MCV 82.5, platelets 550 ?12/08/2020: Hemoglobin 9.9, MCV 85, platelets 405 ?12/23/2020: Hemoglobin 10.3, MCV 85, platelets 409  ?03/07/2021: Hemoglobin 10.7, MCV 89.2, platelets 415 ?  ?Mild thrombocytosis which could be related to mild iron deficiency versus being an acute phase reactant ?She missed her lab appointment.  Therefore we will bring her in next week for lab appointments and then telephone visit couple days after to discuss results ?  ?Anemia of chronic kidney failure, stage 3 (moderate) (HCC) ?hemoglobin staying about 10 there is no role of erythropoietin stimulating agent therapies ? ?MPNST (malignant peripheral nerve sheath tumor) ([place]) ?03/31/2021: MRI bilateral humeri: Lesions along both right and left median nerves.  Left: 2 lesions 1.8 cm and 2 cm; right: 8.7 cm lesion.  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 ?This may be the cause of her upper extremity  pain. ?She will be referred to neurosurgery at [facility] for further evaluation. ?[facility] evaluated her and refer her to neurofibromatosis clinic and is performing additional imaging studies on the neck. ? ?I discussed the assessment and treatment plan with the patient. The patient was provided an opportunity to ask questions and all were answered. The patient agreed with the plan and demonstrated an understanding of the instructions. The patient was advised to call back or seek an in-person evaluation if the symptoms worsen or if the condition fails to improve as anticipated.  ? ?I provided 20 minutes of non-face-to-face time during this encounter. Harriette Ohara, MD   ?Earlie Server am scribing for Dr. Lindi Adie ? ?I have reviewed the above documentation for accuracy and completeness, and I agree with the above. ?. ?

## 2021-05-25 ENCOUNTER — Other Ambulatory Visit: Payer: Self-pay | Admitting: Family Medicine

## 2021-05-25 DIAGNOSIS — H542X22 Low vision right eye category 2, low vision left eye category 2: Secondary | ICD-10-CM

## 2021-05-25 DIAGNOSIS — E1140 Type 2 diabetes mellitus with diabetic neuropathy, unspecified: Secondary | ICD-10-CM

## 2021-05-25 DIAGNOSIS — E1149 Type 2 diabetes mellitus with other diabetic neurological complication: Secondary | ICD-10-CM

## 2021-05-25 DIAGNOSIS — E11311 Type 2 diabetes mellitus with unspecified diabetic retinopathy with macular edema: Secondary | ICD-10-CM

## 2021-05-25 DIAGNOSIS — Z794 Long term (current) use of insulin: Secondary | ICD-10-CM

## 2021-05-26 ENCOUNTER — Inpatient Hospital Stay: Payer: 59 | Attending: Hematology and Oncology

## 2021-05-29 ENCOUNTER — Inpatient Hospital Stay (HOSPITAL_BASED_OUTPATIENT_CLINIC_OR_DEPARTMENT_OTHER): Payer: 59 | Admitting: Hematology and Oncology

## 2021-05-29 DIAGNOSIS — D75839 Thrombocytosis, unspecified: Secondary | ICD-10-CM | POA: Diagnosis not present

## 2021-05-29 DIAGNOSIS — C479 Malignant neoplasm of peripheral nerves and autonomic nervous system, unspecified: Secondary | ICD-10-CM | POA: Diagnosis not present

## 2021-05-29 NOTE — Assessment & Plan Note (Addendum)
03/31/2021: MRI bilateral humeri: Lesions along both right and left median nerves.  Left: 2 lesions 1.8 cm and 2 cm; right: 8.7 cm lesion.  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 ?This may be the cause of her upper extremity pain. ?She will be referred to neurosurgery at [facility] for further evaluation. ?[facility] evaluated her and refer her to neurofibromatosis clinic and is performing additional imaging studies on the neck. ?

## 2021-05-29 NOTE — Assessment & Plan Note (Signed)
Lab review: ?12/08/2019: Hemoglobin 10.9, MCV 82.5, platelets 550 ?12/08/2020: Hemoglobin 9.9, MCV 85, platelets 405 ?12/23/2020: Hemoglobin 10.3, MCV 85, platelets 409? ?03/07/2021: Hemoglobin 10.7, MCV 89.2, platelets 415 ?? ?Mild thrombocytosis?which could be related to mild iron deficiency versus being an acute phase reactant ?? ?Anemia of chronic kidney failure, stage 3 (moderate) (HCC) ?hemoglobin staying about 10 there is no role of erythropoietin stimulating agent therapies ?

## 2021-05-30 ENCOUNTER — Telehealth: Payer: Self-pay | Admitting: Hematology and Oncology

## 2021-05-30 NOTE — Telephone Encounter (Signed)
Scheduled appointment per 4/24 los. Left message. ?

## 2021-06-05 ENCOUNTER — Other Ambulatory Visit: Payer: Self-pay | Admitting: Neurosurgery

## 2021-06-05 ENCOUNTER — Other Ambulatory Visit: Payer: Self-pay

## 2021-06-05 ENCOUNTER — Inpatient Hospital Stay: Payer: 59 | Attending: Hematology and Oncology

## 2021-06-05 DIAGNOSIS — I82721 Chronic embolism and thrombosis of deep veins of right upper extremity: Secondary | ICD-10-CM

## 2021-06-05 DIAGNOSIS — D75839 Thrombocytosis, unspecified: Secondary | ICD-10-CM | POA: Insufficient documentation

## 2021-06-05 DIAGNOSIS — Z79899 Other long term (current) drug therapy: Secondary | ICD-10-CM | POA: Diagnosis not present

## 2021-06-05 DIAGNOSIS — C479 Malignant neoplasm of peripheral nerves and autonomic nervous system, unspecified: Secondary | ICD-10-CM | POA: Insufficient documentation

## 2021-06-05 DIAGNOSIS — M542 Cervicalgia: Secondary | ICD-10-CM

## 2021-06-05 DIAGNOSIS — D509 Iron deficiency anemia, unspecified: Secondary | ICD-10-CM | POA: Insufficient documentation

## 2021-06-05 DIAGNOSIS — Z862 Personal history of diseases of the blood and blood-forming organs and certain disorders involving the immune mechanism: Secondary | ICD-10-CM

## 2021-06-05 LAB — CBC WITH DIFFERENTIAL (CANCER CENTER ONLY)
Abs Immature Granulocytes: 0.05 10*3/uL (ref 0.00–0.07)
Basophils Absolute: 0.1 10*3/uL (ref 0.0–0.1)
Basophils Relative: 1 %
Eosinophils Absolute: 0.3 10*3/uL (ref 0.0–0.5)
Eosinophils Relative: 4 %
HCT: 34 % — ABNORMAL LOW (ref 36.0–46.0)
Hemoglobin: 10.6 g/dL — ABNORMAL LOW (ref 12.0–15.0)
Immature Granulocytes: 1 %
Lymphocytes Relative: 18 %
Lymphs Abs: 1.3 10*3/uL (ref 0.7–4.0)
MCH: 25.5 pg — ABNORMAL LOW (ref 26.0–34.0)
MCHC: 31.2 g/dL (ref 30.0–36.0)
MCV: 81.9 fL (ref 80.0–100.0)
Monocytes Absolute: 0.5 10*3/uL (ref 0.1–1.0)
Monocytes Relative: 7 %
Neutro Abs: 5.2 10*3/uL (ref 1.7–7.7)
Neutrophils Relative %: 69 %
Platelet Count: 436 10*3/uL — ABNORMAL HIGH (ref 150–400)
RBC: 4.15 MIL/uL (ref 3.87–5.11)
RDW: 14.7 % (ref 11.5–15.5)
WBC Count: 7.4 10*3/uL (ref 4.0–10.5)
nRBC: 0 % (ref 0.0–0.2)

## 2021-06-05 LAB — IRON AND IRON BINDING CAPACITY (CC-WL,HP ONLY)
Iron: 36 ug/dL (ref 28–170)
Saturation Ratios: 8 % — ABNORMAL LOW (ref 10.4–31.8)
TIBC: 438 ug/dL (ref 250–450)
UIBC: 402 ug/dL (ref 148–442)

## 2021-06-05 LAB — FERRITIN: Ferritin: 12 ng/mL (ref 11–307)

## 2021-06-06 ENCOUNTER — Inpatient Hospital Stay (HOSPITAL_BASED_OUTPATIENT_CLINIC_OR_DEPARTMENT_OTHER): Payer: 59 | Admitting: Hematology and Oncology

## 2021-06-06 DIAGNOSIS — D500 Iron deficiency anemia secondary to blood loss (chronic): Secondary | ICD-10-CM | POA: Diagnosis not present

## 2021-06-06 DIAGNOSIS — D75839 Thrombocytosis, unspecified: Secondary | ICD-10-CM

## 2021-06-06 DIAGNOSIS — C479 Malignant neoplasm of peripheral nerves and autonomic nervous system, unspecified: Secondary | ICD-10-CM

## 2021-06-06 NOTE — Assessment & Plan Note (Signed)
03/31/2021: MRI bilateral humeri: Lesions along both right and left median nerves. ?Left: 2 lesions 1.8 cm and 2 cm;?right: 8.7 cm lesion. 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 ?

## 2021-06-06 NOTE — Assessment & Plan Note (Signed)
06/05/2021: Hemoglobin 10.6, MCV 81.9, platelets 436, iron saturation 8%, TIBC 438, ferritin 31 ?Mild iron deficiency but not enough to require IV iron infusion ?

## 2021-06-06 NOTE — Progress Notes (Signed)
HEMATOLOGY-ONCOLOGY TELEPHONE VISIT PROGRESS NOTE ? ?I connected with Alicia West on 06/06/21 at 11:15 AM EDT by telephone and verified that I am speaking with the correct person using two identifiers.  ?I discussed the limitations, risks, security and privacy concerns of performing an evaluation and management service by telephone and the availability of in person appointments.  ?I also discussed with the patient that there may be a patient responsible charge related to this service. The patient expressed understanding and agreed to proceed.  ? ?History of Present Illness: Follow-up to discuss results of the blood work ? ?REVIEW OF SYSTEMS:   ?Constitutional: Denies fevers, chills or abnormal weight loss ?  ?Observations/Objective:  ? ?  ?Assessment Plan:  ?Iron deficiency anemia ?06/05/2021: Hemoglobin 10.6, MCV 81.9, platelets 436, iron saturation 8%, TIBC 438, ferritin 31 ?Mild iron deficiency but not enough to require IV iron infusion ? 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  Left: 2 lesions 1.8 cm and 2 cm; right: 8.7 cm lesion.  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 ?She has appointments for an MRI and follow-ups at [facility] ? ?Thrombocytosis ?Lab review: ?12/08/2019: Hemoglobin 10.9, MCV 82.5, platelets 550 ?12/08/2020: Hemoglobin 9.9, MCV 85, platelets 405 ?12/23/2020: Hemoglobin 10.3, MCV 85, platelets 409  ?03/07/2021: Hemoglobin 10.7, MCV 89.2, platelets 415 ?06/06/2021: Hemoglobin 10.6, MCV 81.9, platelets 436, iron saturation 8%, TIBC 438, ferritin 31 ? ?She does have low iron saturation with a ferritin levels are adequate and therefore we do not intend to give her IV iron at this time. ? ? ?I discussed the assessment and treatment plan with the patient. The patient was provided an opportunity to ask questions  and all were answered. The patient agreed with the plan and demonstrated an understanding of the instructions. The patient was advised to call back or seek an in-person evaluation if the symptoms worsen or if the condition fails to improve as anticipated.  ? ?I provided I have reviewed the above documentation for accuracy and completeness, and I agree with the above. ? minutes of non-face-to-face time during this encounter. Harriette Ohara, MD  ? ?

## 2021-06-06 NOTE — Assessment & Plan Note (Addendum)
Lab review: ?12/08/2019: Hemoglobin 10.9, MCV 82.5, platelets 550 ?12/08/2020: Hemoglobin 9.9, MCV 85, platelets 405 ?12/23/2020: Hemoglobin 10.3, MCV 85, platelets 409? ?03/07/2021: Hemoglobin 10.7, MCV 89.2, platelets 415 ?06/06/2021: Hemoglobin 10.6, MCV 81.9, platelets 436, iron saturation 8%, TIBC 438, ferritin 31 ? ?She does have low iron saturation with a ferritin levels are adequate and therefore we do not intend to give her IV iron at this time. ?

## 2021-06-11 ENCOUNTER — Other Ambulatory Visit: Payer: 59

## 2021-06-26 ENCOUNTER — Other Ambulatory Visit: Payer: Self-pay

## 2021-06-27 ENCOUNTER — Other Ambulatory Visit: Payer: Self-pay | Admitting: Family Medicine

## 2021-06-27 MED ORDER — EMPAGLIFLOZIN 10 MG PO TABS: 10.0000 mg | ORAL_TABLET | Freq: Every day | ORAL | 0 refills | Status: DC

## 2021-06-27 NOTE — Telephone Encounter (Signed)
Patient needs appointment with [facility] physician before further refills

## 2021-07-11 ENCOUNTER — Encounter: Payer: Self-pay | Admitting: *Deleted

## 2021-07-27 NOTE — Telephone Encounter (Signed)
Attempted to reach patient . No answer. LVM for patient  to call the office to make follow up appointment.

## 2021-08-22 ENCOUNTER — Encounter: Payer: Self-pay | Admitting: Family Medicine

## 2021-08-22 LAB — HM DIABETES EYE EXAM

## 2021-08-24 ENCOUNTER — Ambulatory Visit (INDEPENDENT_AMBULATORY_CARE_PROVIDER_SITE_OTHER): Payer: 59 | Admitting: Family Medicine

## 2021-08-24 ENCOUNTER — Encounter: Payer: Self-pay | Admitting: Family Medicine

## 2021-08-24 VITALS — BP 124/70 | HR 84 | Ht 69.0 in | Wt 235.0 lb

## 2021-08-24 DIAGNOSIS — C479 Malignant neoplasm of peripheral nerves and autonomic nervous system, unspecified: Secondary | ICD-10-CM

## 2021-08-24 DIAGNOSIS — E1140 Type 2 diabetes mellitus with diabetic neuropathy, unspecified: Secondary | ICD-10-CM

## 2021-08-24 DIAGNOSIS — N1831 Chronic kidney disease, stage 3a: Secondary | ICD-10-CM | POA: Diagnosis not present

## 2021-08-24 DIAGNOSIS — Z794 Long term (current) use of insulin: Secondary | ICD-10-CM

## 2021-08-24 DIAGNOSIS — E1159 Type 2 diabetes mellitus with other circulatory complications: Secondary | ICD-10-CM

## 2021-08-24 DIAGNOSIS — L732 Hidradenitis suppurativa: Secondary | ICD-10-CM

## 2021-08-24 DIAGNOSIS — R29898 Other symptoms and signs involving the musculoskeletal system: Secondary | ICD-10-CM

## 2021-08-24 DIAGNOSIS — I152 Hypertension secondary to endocrine disorders: Secondary | ICD-10-CM

## 2021-08-24 LAB — POCT GLYCOSYLATED HEMOGLOBIN (HGB A1C): HbA1c, POC (controlled diabetic range): 8.8 % — AB (ref 0.0–7.0)

## 2021-08-24 MED ORDER — OZEMPIC (2 MG/DOSE) 8 MG/3ML ~~LOC~~ SOPN: 2.0000 mg | PEN_INJECTOR | SUBCUTANEOUS | 5 refills | Status: DC

## 2021-08-24 MED ORDER — DOXYCYCLINE HYCLATE 100 MG PO TABS: 100.0000 mg | ORAL_TABLET | Freq: Two times a day (BID) | ORAL | 0 refills | Status: AC

## 2021-08-24 NOTE — Patient Instructions (Addendum)
Your A1c was 8.8 % which is up from 8.4 % last year.  Your goal A1c is 7.5% or less. 7.5% is usually home blood sugar measurements less than 180.   Please increase your Ozempic to 2 mg injection once a week.    We are checking your kidneys with blood and urine tests today.   Your weight is down 11 pounds since February.   Blood pressure looks good.  I believe the draining skin Knot in your groin is hidradenitis.  This is best treated with Doxycycline antibiotic when it flares up.  Surgery is a option if it keeps flaring up.  I recommend not shaving in the skin around the knot.   I recommend staying on the Eliquis until the arm tumors have been successfully treated.  This is to prevent the recurrence of the blood clots.      SHINGLES VACCINATION  Chickenpox and shingles are related because they are caused by the same virus (varicella-zoster virus). After a person recovers from chickenpox, the virus stays dormant (inactive) in the body. It can reactivate years later and cause shingles.  CDC recommends that adults 51 years and older get two doses of the shingles vaccine called Shingrix (recombinant zoster vaccine) to prevent shingles and the complications from the disease.  Shingrix vaccination provides strong protection against shingles. In adults 50 years Shingrix is more than 90% effective at preventing shingles.  Studies show that Shingrix is safe. The vaccine helps your body create a strong defense against shingles. As a result, you are likely to have temporary side effects from getting the shots. Some people feel they are having a mild flu without the head cold symptoms.  The side effects might affect your ability to do normal daily activities for 2 to 3 days.  Getting the shot when you have a couple days without commitments is a good idea, in case you do feel under the weather.  Medicare prescription drug plans (Part D) usually cover all available vaccines needed to prevent illness,  like the shingles (Shingrix) shot. Contact your Medicare drug plan If you are uncertain if they will cover the shingles vaccination.   Your pharmacist can give you Shingrix as a shot in your upper arm. You will need two Shingrix vaccinations to complete your inoculation against shingles.  The second injection is given at least 2 months after the first Shingrix vaccination was given.      Tdap (Tetanus, Diphtheria, Pertussis) Vaccine: What You Need to Know  Please get your Tdap vaccination at your pharmacy  1. Why get vaccinated? Tdap vaccine can prevent tetanus, diphtheria, and pertussis. Diphtheria and pertussis spread from person to person. Tetanus enters the body through cuts or wounds. TETANUS (T) causes painful stiffening of the muscles. Tetanus can lead to serious health problems, including being unable to open the mouth, having trouble swallowing and breathing, or death. DIPHTHERIA (D) can lead to difficulty breathing, heart failure, paralysis, or death. PERTUSSIS (aP), also known as "whooping cough," can cause uncontrollable, violent coughing that makes it hard to breathe, eat, or drink. Pertussis can be extremely serious especially in babies and young children, causing pneumonia, convulsions, brain damage, or death. In teens and adults, it can cause weight loss, loss of bladder control, passing out, and rib fractures from severe coughing. 2. Tdap vaccine Tdap is only for children 7 years and older, adolescents, and adults.  Adolescents should receive a single dose of Tdap, preferably at age 39 or 42 years. Pregnant people  should get a dose of Tdap during every pregnancy, preferably during the early part of the third trimester, to help protect the newborn from pertussis. Infants are most at risk for severe, life-threatening complications from pertussis. Adults who have never received Tdap should get a dose of Tdap. Also, adults should receive a booster dose of either Tdap or Td (a  different vaccine that protects against tetanus and diphtheria but not pertussis) every 10 years, or after 5 years in the case of a severe or dirty wound or burn. Tdap may be given at the same time as other vaccines. 3. Talk with your health care provider Tell your vaccine provider if the person getting the vaccine: Has had an allergic reaction after a previous dose of any vaccine that protects against tetanus, diphtheria, or pertussis, or has any severe, life-threatening allergies Has had a coma, decreased level of consciousness, or prolonged seizures within 7 days after a previous dose of any pertussis vaccine (DTP, DTaP, or Tdap) Has seizures or another nervous system problem Has ever had Guillain-Barr Syndrome (also called "GBS") Has had severe pain or swelling after a previous dose of any vaccine that protects against tetanus or diphtheria In some cases, your health care provider may decide to postpone Tdap vaccination until a future visit. People with minor illnesses, such as a cold, may be vaccinated. People who are moderately or severely ill should usually wait until they recover before getting Tdap vaccine.  Your health care provider can give you more information. 4. Risks of a vaccine reaction Pain, redness, or swelling where the shot was given, mild fever, headache, feeling tired, and nausea, vomiting, diarrhea, or stomachache sometimes happen after Tdap vaccination. People sometimes faint after medical procedures, including vaccination. Tell your provider if you feel dizzy or have vision changes or ringing in the ears.  As with any medicine, there is a very remote chance of a vaccine causing a severe allergic reaction, other serious injury, or death. 5. What if there is a serious problem? An allergic reaction could occur after the vaccinated person leaves the clinic. If you see signs of a severe allergic reaction (hives, swelling of the face and throat, difficulty breathing, a fast  heartbeat, dizziness, or weakness), call 9-1-1 and get the person to the nearest hospital. For other signs that concern you, call your health care provider.  Adverse reactions should be reported to the Vaccine Adverse Event Reporting System (VAERS). Your health care provider will usually file this report, or you can do it yourself. Visit the VAERS website at www.vaers.SamedayNews.es or call 5864293565. VAERS is only for reporting reactions, and VAERS staff members do not give medical advice. 6. The National Vaccine Injury Compensation Program The [employer] Vaccine Injury Compensation Program (VICP) is a federal program that was created to compensate people who may have been injured by certain vaccines. Claims regarding alleged injury or death due to vaccination have a time limit for filing, which may be as short as two years. Visit the VICP website at GoldCloset.com.ee or call 6841137188 to learn about the program and about filing a claim. 7. How can I learn more? Ask your health care provider. Call your local or state health department. Visit the website of the Food and Drug Administration (FDA) for vaccine package inserts and additional information at TraderRating.uy. Contact the Centers for Disease Control and Prevention (CDC): Call 912-761-3299 (1-800-CDC-INFO) or Visit CDC's website at http://hunter.com/. Source: CDC Vaccine Information Statement Tdap (Tetanus, Diphtheria, Pertussis) Vaccine (09/11/2019) This same material  is available at http://www.wolf.info/ for no charge. This information is not intended to replace advice given to you by your health care provider. Make sure you discuss any questions you have with your health care provider. Document Revised: 12/21/2020 Document Reviewed: 10/24/2020 Elsevier Patient Education  [place].

## 2021-08-25 ENCOUNTER — Encounter: Payer: Self-pay | Admitting: Family Medicine

## 2021-08-25 DIAGNOSIS — L732 Hidradenitis suppurativa: Secondary | ICD-10-CM

## 2021-08-25 HISTORY — DX: Hidradenitis suppurativa: L73.2

## 2021-08-25 LAB — MICROALBUMIN / CREATININE URINE RATIO
Creatinine, Urine: 97.3 mg/dL
Microalb/Creat Ratio: 6 mg/g creat (ref 0–29)
Microalbumin, Urine: 5.7 ug/mL

## 2021-08-25 LAB — RENAL FUNCTION PANEL
Albumin: 4.3 g/dL (ref 3.8–4.9)
BUN/Creatinine Ratio: 14 (ref 9–23)
BUN: 21 mg/dL (ref 6–24)
CO2: 21 mmol/L (ref 20–29)
Calcium: 10.5 mg/dL — ABNORMAL HIGH (ref 8.7–10.2)
Chloride: 99 mmol/L (ref 96–106)
Creatinine, Ser: 1.49 mg/dL — ABNORMAL HIGH (ref 0.57–1.00)
Glucose: 249 mg/dL — ABNORMAL HIGH (ref 70–99)
Phosphorus: 3.2 mg/dL (ref 3.0–4.3)
Potassium: 4.3 mmol/L (ref 3.5–5.2)
Sodium: 138 mmol/L (ref 134–144)
eGFR: 42 mL/min/{1.73_m2} — ABNORMAL LOW (ref >59)

## 2021-08-25 NOTE — Assessment & Plan Note (Signed)
Established problem that has improved with 11 pounds (5%) loss since February and has not meet goal of 7 to 10% loss.  Increase Ozempic to 2 mg [place] weekly RTC 3 months Can increase Jardiance if patient willing

## 2021-08-25 NOTE — Assessment & Plan Note (Addendum)
Established problem Right-handed Uncomfortable paresthesias bilateral forearms, right > left.  The discomfort is most in first three fingers of right hand with further pain into volar forearm. Discomfort limiting her ability to lift.  Discomfort worsens when shoulder raised above about 70 degrees flexion.  She reports that [facility] NS instructed her to not lift more than 50 pounds at a time.  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   Prescribed gabapentin helps a little, but at cost of drowsiness. APAP helps a little.   Alicia West is interested in a trial of Occupational Therapy to see if she can learn to work without the discomfort. Occupational Therapy request sent.   Work limitation note stating work limitation of lifting no more than 50 pounds at a time.

## 2021-08-25 NOTE — Assessment & Plan Note (Signed)
Established problem worsened.  Patient is not at goal of A1c < 7.5%  Change: Increase Ozempic to 2 mg [place] wkly  Continue: Jardiance 10 mg daily, metformin 1000 mg twice a day, Toujeo 34 unit qam  Ms Speltz is hesitant to increase Jardiane further because of frequency of urination is already problemation atr 10 mg.  RTC for A1c in 3 months.

## 2021-08-25 NOTE — Assessment & Plan Note (Signed)
Established problem Urine Alb/Cr and Basic Metabolic Panel collected

## 2021-08-25 NOTE — Progress Notes (Addendum)
Alicia West is alone Sources of clinical information for visit is/are patient and 05/29/21 Hem/Onc (Dr Benedetto Coons) televisit note, 05/16/21 [facility] (Dr . Tyler Pita [neurosurg]) - patient referred to [facility], ordered Cervical MRI and EMG study. Nursing assessment for this office visit was reviewed with the patient for accuracy and revision.      08/13/2019    9:02 AM  Depression screen PHQ 2/9  Decreased Interest 0  Down, Depressed, Hopeless 0  PHQ - 2 Score 0   Flowsheet Row Office Visit from 07/29/2019 in [place] Office Visit from 04/04/2016 in [place] Office Visit from 01/25/2016 in [place]  Thoughts that you would be better off dead, or of hurting yourself in some way Not at all Not at all Not at all  PHQ-9 Total Score 19 0 0          08/13/2019    9:02 AM 01/28/2019    8:43 AM 04/16/2018    3:05 PM 01/31/2018    8:36 AM 01/07/2018    3:04 PM  Fall Risk   Falls in the past year? 0 0 0 0 0  Number falls in past yr: 0 0 0  0  Injury with Fall?   0  0  Follow up Falls evaluation completed  Falls evaluation completed         08/13/2019    9:02 AM 07/29/2019    3:24 PM 01/28/2019    8:43 AM  PHQ9 SCORE ONLY  PHQ-9 Total Score 0 19 3    Adult vaccines due  Topic Date Due   TETANUS/TDAP  05/29/2021    Health Maintenance Due  Topic Date Due   Zoster Vaccines- Shingrix (1 of 2) Never done   COVID-19 Vaccine (4 - Booster) 02/02/2021   FOOT EXAM  02/24/2021   TETANUS/TDAP  05/29/2021      History/P.E. limitations: none  Adult vaccines due  Topic Date Due   TETANUS/TDAP  05/29/2021    Diabetes Health Maintenance Due  Topic Date Due   FOOT EXAM  02/24/2021   HEMOGLOBIN A1C  02/24/2022   OPHTHALMOLOGY EXAM  08/23/2022    Health Maintenance Due  Topic Date Due   Zoster Vaccines- Shingrix (1 of 2) Never done   COVID-19 Vaccine (4 - Booster) 02/02/2021   FOOT EXAM  02/24/2021    TETANUS/TDAP  05/29/2021     Chief Complaint  Patient presents with   Diabetes   Recurrent Skin Infections    Visit Problem List with A/P  Type 2 diabetes mellitus with diabetic neuropathy, unspecified ([place]) Established problem worsened.  Patient is not at goal of A1c < 7.5%  Change: Increase Ozempic to 2 mg [place] wkly  Continue: Jardiance 10 mg daily, metformin 1000 mg twice a day, Toujeo 34 unit qam  Ms Havrilla is hesitant to increase Jardiane further because of frequency of urination is already problemation atr 10 mg.  RTC for A1c in 3 months.    Hypertension associated with diabetes ([place]) Established problem Well Controlled and is at goal of 140/90. No signs of complications, medication side effects, or red flags. Continue current medications and other regiments.   Morbid obesity ([place]) Established problem that has improved with 11 pounds (5%) loss since February and has not meet goal of 7 to 10% loss.  Increase Ozempic to 2 mg [place] weekly RTC 3 months Can increase Jardiance if patient willing   MPNST (malignant  peripheral nerve sheath tumor) ([place]) Established problem Right-handed Uncomfortable paresthesias bilateral forearms, right > left.  The discomfort is most in first three fingers of right hand with further pain into volar forearm. Discomfort limiting her ability to lift.  Discomfort worsens when shoulder raised above about 70 degrees flexion.  She reports that [facility] NS instructed her to not lift more than 50 pounds at a time.  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   Prescribed gabapentin helps a little, but at cost of drowsiness. APAP helps a little.   Ms Bronkema is interested in a trial of Occupational Therapy to see if she can learn to work without the discomfort. Occupational Therapy request sent.    Chronic kidney disease, stage 3a ([place]) Established problem Urine Alb/Cr and Basic Metabolic Panel  collected  Hydradenitis New problem Recurrent right inguinal 2-3 cm skin, erythematous skin nodule with slightly pale center with peripheral induration and central fluctuance.  Pubic hair shaven Will spontaneously drain over last several months.   Working diagnosis is hidradenitis, [place] stage 2  Recommend Course of doxy Avoid shaving in area of nodule.  diabetes mellitus control and weight loss Patient is currently on Spironolactone per her dermatologist for Acne.  May need to increase Arlyce Harman dose  May add topical Clinda if recurrence and/or maintenance doxy  Diabetic Foot Exam - Simple   Simple Foot Form  08/24/2021  9:46 AM  Visual Inspection No deformities, no ulcerations, no other skin breakdown bilaterally: Yes Sensation Testing Intact to touch and monofilament testing bilaterally: Yes Pulse Check Posterior Tibialis and Dorsalis pulse intact bilaterally: Yes Comments

## 2021-08-25 NOTE — Assessment & Plan Note (Signed)
Established problem Well Controlled and is at goal of 140/90. No signs of complications, medication side effects, or red flags. Continue current medications and other regiments.

## 2021-08-25 NOTE — Assessment & Plan Note (Signed)
New problem Recurrent right inguinal 2-3 cm skin, erythematous skin nodule with slightly pale center with peripheral induration and central fluctuance.  Pubic hair shaven Will spontaneously drain over last several months.   Working diagnosis is hidradenitis, [place] stage 2  Recommend Course of doxy Avoid shaving in area of nodule.  diabetes mellitus control and weight loss Patient is currently on Spironolactone per her dermatologist for Acne.  May need to increase Arlyce Harman dose  May add topical Clinda if recurrence and/or maintenance doxy

## 2021-09-11 ENCOUNTER — Ambulatory Visit: Payer: 59 | Attending: Family Medicine | Admitting: Occupational Therapy

## 2021-09-11 ENCOUNTER — Other Ambulatory Visit: Payer: Self-pay | Admitting: Family Medicine

## 2021-09-11 ENCOUNTER — Encounter: Payer: Self-pay | Admitting: Occupational Therapy

## 2021-09-11 DIAGNOSIS — R278 Other lack of coordination: Secondary | ICD-10-CM | POA: Insufficient documentation

## 2021-09-11 DIAGNOSIS — M6281 Muscle weakness (generalized): Secondary | ICD-10-CM | POA: Insufficient documentation

## 2021-09-11 DIAGNOSIS — M79601 Pain in right arm: Secondary | ICD-10-CM | POA: Insufficient documentation

## 2021-09-11 DIAGNOSIS — R208 Other disturbances of skin sensation: Secondary | ICD-10-CM | POA: Insufficient documentation

## 2021-09-11 DIAGNOSIS — I82621 Acute embolism and thrombosis of deep veins of right upper extremity: Secondary | ICD-10-CM

## 2021-09-11 NOTE — Therapy (Signed)
OUTPATIENT OCCUPATIONAL THERAPY ORTHO EVALUATION  Patient Name: Alicia West MRN: [id number] DOB:1971-02-04, 51 y.o., female Today's Date: 09/11/2021  PCP: McDiarmid, Blane Ohara, MD  REFERRING PROVIDER: McDiarmid, Blane Ohara, MD    OT End of Session - 09/11/21 0948     Visit Number 1    Number of Visits 17    Date for OT Re-Evaluation 11/10/21    Authorization Type UHC 2023--covered 100%, 23 visit limit, auth not required    OT Start Time 0806    OT Stop Time 0848    OT Time Calculation (min) 42 min    Activity Tolerance Patient tolerated treatment well    Behavior During Therapy [facility] for tasks assessed/performed             Past Medical History:  Diagnosis Date   Acne vulgaris 10/01/2017   Allergy    seasonal sinus allergies   anemia of chronic kidney 12/23/2020   Anemia of chronic kidney failure, stage 3 (moderate) ([place]) 02/09/2021   Diagnosis by Dr Domingo Dimes (Hem-Onc) 02/09/21   ANEMIA, IRON DEFICIENCY, UNSPEC. 04/04/2006   Qualifier: Diagnosis of  By: Samara Snide     Arm DVT (deep venous thromboembolism), chronic, right ([place]) 12/23/2020   Bilateral senile cataracts 11/20/2018   Mixed OU   Binge-eating disorder, moderate 03/17/2013   Breast lump 07/22/2009   Qualifier: Diagnosis of  By: Netty Starring  MD, Kanhka     Chronic constipation 02/21/2015   Chronic kidney disease, stage 3a ([place]) 02/26/2020   CKD stage 3 secondary to diabetes ([place]) 10/17/2018   Deep vein thrombosis (DVT) of brachial vein of right upper extremity ([place]) 11/29/2020   DEPRESSIVE DISORDER, NOS 04/04/2006   Qualifier: Diagnosis of  By: Samara Snide     Diabetes mellitus    on meds   Diabetic macular edema ([place])    by patient report 10/27/2019   Diabetic macular edema of both eyes ([place]) 11/20/2018   History VEGF injection and photolaser therapy   Diabetic neuropathy ([place]) 10/17/2018   Diabetic peripheral neuropathy ([place])    per patient   Diabetic visual loss: severe vision impairment of both eyes, with macular  edema, with severe nonproliferative retinopathy, associated with type 2 diabetes mellitus ([place]) 71/69/6789       Folliculitis 3/81/0175   GERD with esophagitis 01/20/2020   EGD Dr Scarlette Shorts 01/2020   Heavy menses    Pt reports she was to get a hysterectomy   Herpes labialis 09/06/2010   Herpes simplex labialis    History of glaucoma    By patient report.    History of HPV infection 08/18/2019   Hypercalcemia 08/13/2019   Hyperlipidemia    diet controlled/not taking meds for this   Hypersomnia with sleep apnea 10/06/2019   Hypertension    on meds   Hypertension associated with diabetes ([place]) 04/04/2006   Qualifier: Diagnosis of  By: Samara Snide     Hypertensive retinopathy    OU   Hypertensive retinopathy of both eyes 11/20/2018   Iron deficiency anemia 04/04/2006   Qualifier: Diagnosis of  By: Samara Snide     Loud snoring 10/06/2019   Macular degeneration 03/17/2012   Menorrhagia 04/04/2006   Qualifier: Diagnosis of  By: Samara Snide     Mixed hyperlipidemia due to type 2 diabetes mellitus ([place]) 12/01/2008   Qualifier: Diagnosis of  By: Netty Starring  MD, Kanhka     Mixed incontinence 10/17/2018   Morbid obesity ([place]) 05/01/2013   MPNST (malignant peripheral nerve  sheath tumor) ([place]) 04/03/2021   NAFL (nonalcoholic fatty liver) 05/07/270   Abd [place] 09/07/19: Echogenic parenchyma, likely fatty infiltration  Abd [place] 2016: Suspected hepatic steatosis.   Nerve sheath tumor, Bilateral humeral arm 04/10/2021   Neuromuscular disorder (HCC)    Obstructive sleep apnea 11/27/2019   Osteoporosis    bilateral hips R>L   Paroxysmal supraventricular tachycardia ([place]) 10/26/2014   PONV (postoperative nausea and vomiting)    POSITIVE PPD 10/18/2009   Qualifier: Diagnosis of  By: Zebedee Iba NP, Manuela Schwartz     Prurigo nodularis 10/17/2018   RHINITIS, ALLERGIC 04/04/2006   Qualifier: Diagnosis of  By: Samara Snide     Right arm pain 11/25/2020   Sleep apnea    Sleep related choking sensation 10/06/2019   TB (tuberculosis)  contact    + skin test,/- chest x-ray - in mid 2000's   Thrombocytosis 08/18/2019   Tuberculosis    postitive 2011-[facility]ared per pt   Tubular adenoma of colon 2021   2 mm sessile, Rikki Spearing MD ([facility])   Type 2 diabetes mellitus with diabetic neuropathy, unspecified ([place]) 10/18/2009   Fax'd office visit notes and prescription for CGM to [facility] 04/18/21  Cannot tolerate Metformin at high doses b/c of myalgias and diarrhea.   Can take metformin at current 580m bid dose.   Visual impairment of left eye 08/18/2019   Past Surgical History:  Procedure Laterality Date   REDUCTION MAMMAPLASTY     TONSILLECTOMY     TONSILLECTOMY     WISDOM TOOTH EXTRACTION     Patient Active Problem List   Diagnosis Date Noted   Hydradenitis 08/25/2021   MPNST (malignant peripheral nerve sheath tumor) ([place] 04/03/2021   Anemia of chronic kidney failure, stage 3 (moderate) ([place] 02/09/2021   Arm DVT (deep venous thromboembolism), chronic, right ([place] 12/23/2020   Deep vein thrombosis (DVT) of brachial vein of right upper extremity ([place] 11/29/2020   Insulin long-term use ([place] 11/25/2020   BMI 35.0-35.9,adult 11/25/2020   Chronic kidney disease, stage 3a ([place] 02/26/2020   GERD with esophagitis 01/20/2020   Obstructive sleep apnea 11/27/2019   Hypersomnia with sleep apnea 10/06/2019   Perimenopausal vasomotor symptoms 09/03/2019   Thrombocytosis 08/18/2019   NAFL (nonalcoholic fatty liver) 053/66/4403  Daytime somnolence 08/18/2019   Visual impairment of left eye 08/18/2019   History of glaucoma    Diabetic macular edema of both eyes ([place] 11/20/2018   Diabetic visual loss: severe vision impairment of both eyes, with macular edema, with severe nonproliferative retinopathy, associated with type 2 diabetes mellitus ([place] 11/20/2018   Hypertensive retinopathy of both eyes 11/20/2018   Bilateral senile cataracts 11/20/2018   Diabetic neuropathy ([place] 10/17/2018   Hyperpigmentation  10/17/2018   Mixed incontinence 10/17/2018   Acne vulgaris 10/01/2017   Chronic constipation 02/21/2015   Morbid obesity ([place] 05/01/2013   Macular degeneration 03/17/2012   Type 2 diabetes mellitus with diabetic neuropathy, unspecified ([place] 10/18/2009   Mixed hyperlipidemia due to type 2 diabetes mellitus ([place] 12/01/2008   Iron deficiency anemia 04/04/2006   Hypertension associated with diabetes ([place] 04/04/2006    ONSET DATE: 03/31/21  REFERRING DIAG: C47.9 (ICD-10-CM) - MPNST (malignant peripheral nerve sheath tumor) (HCC) R29.898 (ICD-10-CM) - Impaired strength of upper extremity   THERAPY DIAG:  Pain in right arm  Other disturbances of skin sensation  Muscle weakness (generalized)  Other lack of coordination  Rationale for Evaluation and Treatment Rehabilitation  SUBJECTIVE:   SUBJECTIVE STATEMENT: I'm having trouble with my  daily activities.  Pt reports that she has 2 more tests through [facility] (EMG tomorrow) to see if she can use meds to shrink tumors, pt reports surgery doesn't seem like an option due to type/location of tumor  Pt accompanied by: self  PERTINENT HISTORY:  malignant peripheral nerve sheath tumor   PMH:  DM, hyperlipidemia, anemia, HTN, diabetic vision loss with macular degeneration, diabetic neuropathy, bilateral cataracts, thrombocytosis, nonalcoholic fatty liver, glaucoma, sleep apnea, chronic kidney disease, hx of DVT RUE, hydradenitis  PRECAUTIONS: Other: no lifting over 50lbs  WEIGHT BEARING RESTRICTIONS No  PAIN:  Are you having pain? Yes: NPRS scale: 6/10 Pain location: R dorsal proximal arm Pain description: pulling, dull, tightness Aggravating factors: putting hair in ponytail, lifting arms above head  Relieving factors: rest , heat  FALLS: Has patient fallen in last 6 months? 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, but unable to perform), baking/decorating cakes (takes longer, difficulty)  PATIENT GOALS   improve daily activities (having to stop and rest frequently, do hair, washing difficult)  OBJECTIVE:   HAND DOMINANCE: Right  ADLs: Overall ADLs: pain/burning in hands with using phone, difficulty opening jars/containers, difficulty lifting heavier pots/pans, difficulty with cleaning (particularly pressing down to wipe/scrub) Transfers/ambulation related to ADLs: mod I Eating: mod I Grooming: difficulty putting hair in ponytail ("that's all I can do with my hair), pressing down to put in hair cream hurts, brushing hair difficult due to pain/fatigue UB Dressing: difficulty/pain pulling shirt down in back, difficulty with buttons LB Dressing: may need help with dress shoe buckle (doesn't wear often) Toileting: mod I Bathing: difficulty washing, using LUE more Tub Shower transfers: mod I   FUNCTIONAL OUTCOME MEASURES: Upper Extremity Functional Scale (UEFS): 27/80  UPPER EXTREMITY ROM:    BUEs grossly WNL, however, pt reports incr pain/difficulty with thumb opposition and finger adduction (decr 5th digit ROM)   UPPER EXTREMITY MMT:   R shoulder flex, horizontal abduction, and abduction 4-/5, adduction 4/5, biceps/triceps 5/5. L shoulder grossly 4+/5, biceps/triceps 5/5  HAND FUNCTION: Grip strength: Right: 41.5 lbs; Left: 44.7 lbs, Lateral pinch: Right: 17 lbs, Left: 16 lbs, 3 point pinch: Right: 10 lbs, Left: 8 lbs, and Tip pinch: Right 14 lbs, Left: 13 lbs  COORDINATION: 9 Hole Peg test: Right: 20.5 sec; Left: 22.35 sec  SENSATION: Pt reports significant numbness and burning in both hands, cold sensitivity.   COGNITION: Overall cognitive status: Within functional limits for tasks assessed   TODAY'S TREATMENT:  Eval completed today   PATIENT EDUCATION: Education details: OT eval results/POC Person  educated: Patient Education method: Explanation Education comprehension: verbalized understanding   HOME EXERCISE PROGRAM: Not yet issued  GOALS: Potential Goals reviewed with patient? Yes  SHORT TERM GOALS: Target date: 10/09/2021    Pt will be independent with initial HEP (shoulder ROM, nerve glides, high-level coordination) Goal status: INITIAL  2.  Pt will verbalize understanding of adaptive strategies/AE to incr ease and decr pain with ADLs and IADLs. Goal status: INITIAL  3.  Pt will verbalize understanding of proper positioning of UEs to decr risk of pain. Goal status: INITIAL   LONG TERM GOALS: Target date: 11/06/2021    Pt will be independent with updated HEP (strengthening hand and proximally).  Goal status: INITIAL  2.  Pt will be able to perform overhead repetitive activities for  at least 32mn prior to rest break to assist in styling hair. Goal status: INITIAL  3.  Pt will improve RUE functional use as shown by improving score on Upper Extremity Functional Scale to at least 35/80. Baseline:  27/80 Goal status: INITIAL    ASSESSMENT:  CLINICAL IMPRESSION: Patient is a 51y.o. female who was seen today for occupational therapy evaluation for malignant peripheral nerve sheath tumor.  Pt reports that she was diagnosed 03/2021 and is still undergoing testing, but she is having difficulty with daily activities.  PMH also includes:   DM, hyperlipidemia, anemia, HTN, diabetic vision loss with macular degeneration, diabetic neuropathy, bilateral cataracts, thrombocytosis, nonalcoholic fatty liver, glaucoma, sleep apnea, chronic kidney disease, hx of DVT RUE, hydradenitis.  Pt presents today with decreased sensation, pain, decr strength/endurance, decr coordination affecting UE functional use, particularly dominant RUE functional use.  Pt would benefit from occupational therapy to address these deficits for improved UE functional use and decr pain.  PERFORMANCE DEFICITS in  functional skills including ADLs, IADLs, coordination, dexterity, sensation, strength, pain, FMC, GMC, endurance, decreased knowledge of precautions, decreased knowledge of use of DME, and UE functional use and psychosocial skills including environmental adaptation and routines and behaviors.   IMPAIRMENTS are limiting patient from ADLs, IADLs, rest and sleep, work, and leisure.   COMORBIDITIES may have co-morbidities  that affects occupational performance. Patient will benefit from skilled OT to address above impairments and improve overall function.  MODIFICATION OR ASSISTANCE TO COMPLETE EVALUATION: No modification of tasks or assist necessary to complete an evaluation.  OT OCCUPATIONAL PROFILE AND HISTORY: Detailed assessment: Review of records and additional review of physical, cognitive, psychosocial history related to current functional performance.  CLINICAL DECISION MAKING: Moderate - several treatment options, min-mod task modification necessary  REHAB POTENTIAL: Good  EVALUATION COMPLEXITY: Low      PLAN: OT FREQUENCY: 2x/week  OT DURATION: 8 weeks+eval due to scheduling (may only need 6 weeks)  PLANNED INTERVENTIONS: self care/ADL training, therapeutic exercise, therapeutic activity, neuromuscular re-education, manual therapy, passive range of motion, aquatic therapy, splinting, paraffin, fluidotherapy, moist heat, patient/family education, energy conservation, and DME and/or AE instructions  RECOMMENDED OTHER SERVICES: none at this time  CONSULTED AND AGREED WITH PLAN OF CARE: Patient  PLAN FOR NEXT SESSION: initiate UE nerve glides, shoulder ROM, high level coordination HEP   Acen Craun, OTR/L 09/11/2021, 10:00 AM

## 2021-09-12 DIAGNOSIS — Z1231 Encounter for screening mammogram for malignant neoplasm of breast: Secondary | ICD-10-CM

## 2021-09-19 ENCOUNTER — Ambulatory Visit: Payer: 59 | Admitting: Occupational Therapy

## 2021-09-21 ENCOUNTER — Encounter: Payer: 59 | Admitting: Occupational Therapy

## 2021-09-26 ENCOUNTER — Encounter: Payer: 59 | Admitting: Occupational Therapy

## 2021-09-27 ENCOUNTER — Encounter: Payer: 59 | Admitting: Occupational Therapy

## 2021-09-28 ENCOUNTER — Other Ambulatory Visit: Payer: Self-pay | Admitting: Family Medicine

## 2021-10-03 ENCOUNTER — Encounter: Payer: 59 | Admitting: Occupational Therapy

## 2021-10-05 ENCOUNTER — Ambulatory Visit (INDEPENDENT_AMBULATORY_CARE_PROVIDER_SITE_OTHER): Payer: 59 | Admitting: Family Medicine

## 2021-10-05 ENCOUNTER — Encounter: Payer: Self-pay | Admitting: Family Medicine

## 2021-10-05 ENCOUNTER — Encounter: Payer: 59 | Admitting: Occupational Therapy

## 2021-10-05 VITALS — BP 122/70 | HR 118 | Ht 69.0 in | Wt 231.0 lb

## 2021-10-05 DIAGNOSIS — R058 Other specified cough: Secondary | ICD-10-CM | POA: Diagnosis not present

## 2021-10-05 DIAGNOSIS — B3731 Acute candidiasis of vulva and vagina: Secondary | ICD-10-CM | POA: Diagnosis not present

## 2021-10-05 MED ORDER — AZELASTINE-FLUTICASONE 137-50 MCG/ACT NA SUSP: 1.0000 | Freq: Two times a day (BID) | NASAL | 2 refills | Status: DC

## 2021-10-05 MED ORDER — FLUCONAZOLE 150 MG PO TABS
ORAL_TABLET | ORAL | 2 refills | Status: DC
Start: 2021-10-05 — End: 2022-05-21

## 2021-10-05 NOTE — Patient Instructions (Addendum)
Please take a Diflucan (fluconazole) tablet now.  You can repeat with each flare up of yeast infection.  Start Azelastine-fluconazole nasal spray, one spray each nostril twice a day until cough has resolved.    You are continuing to lose weight.  Your blood pressure is great.  With the blood sugar at home, I suspect you will be at goal when we check your A1c in October.

## 2021-10-06 ENCOUNTER — Telehealth: Payer: Self-pay

## 2021-10-06 ENCOUNTER — Other Ambulatory Visit (HOSPITAL_COMMUNITY): Payer: Self-pay

## 2021-10-06 ENCOUNTER — Encounter: Payer: Self-pay | Admitting: Physician Assistant

## 2021-10-06 ENCOUNTER — Encounter: Payer: Self-pay | Admitting: Family Medicine

## 2021-10-06 DIAGNOSIS — R058 Other specified cough: Secondary | ICD-10-CM

## 2021-10-06 DIAGNOSIS — B3731 Acute candidiasis of vulva and vagina: Secondary | ICD-10-CM | POA: Insufficient documentation

## 2021-10-06 HISTORY — DX: Other specified cough: R05.8

## 2021-10-06 NOTE — Assessment & Plan Note (Signed)
New concern Onset cough after Covid infection last week in July. Nonproductive Worse lying down. Stble course Rhinorrhea and PND  Tried Chlorocetin (OtC) with little improvement Not smoker No underlying lung disease  Exam HEENT: TMs translucent with good LM, pale nasal mucosa, o/p wo e/e, no Cx LAN Lung: BCTA, no crackles or wheeze Cor: RRR, no murmur Ext: no edema  A/ Likely Post-viral cough syndrome P/ Prescription azelastine-fluticasone NS monitor

## 2021-10-06 NOTE — Assessment & Plan Note (Signed)
New problem since start of SGLT2i White vaginal discharge No odor No vulvovaginal itching or pain Did not complete trial of OTC antifungal IV therapy because of unpleasantness of application A/ Suspect yeast vaginitis secondary to SGLT2i therapy P/ Prescription Diflucan 150 mg tab x 1.  Refills available for self initiation for recurrent symptoms/signs. Let Dr Trashaun Streight know if discharge does not improve.

## 2021-10-06 NOTE — Progress Notes (Signed)
Alicia West is alone Sources of clinical information for visit is/are patient. Nursing assessment for this office visit was reviewed with the patient for accuracy and revision.     Previous Report(s) Reviewed: none     08/13/2019    9:02 AM  Depression screen PHQ 2/9  Decreased Interest 0  Down, Depressed, Hopeless 0  PHQ - 2 Score 0   Flowsheet Row Office Visit from 07/29/2019 in [place] Office Visit from 04/04/2016 in [place] Office Visit from 01/25/2016 in [facility]  Thoughts that you would be better off dead, or of hurting yourself in some way Not at all Not at all Not at all  PHQ-9 Total Score 19 0 0          08/13/2019    9:02 AM 01/28/2019    8:43 AM 04/16/2018    3:05 PM 01/31/2018    8:36 AM 01/07/2018    3:04 PM  Fall Risk   Falls in the past year? 0 0 0 0 0  Number falls in past yr: 0 0 0  0  Injury with Fall?   0  0  Follow up Falls evaluation completed  Falls evaluation completed         08/13/2019    9:02 AM 07/29/2019    3:24 PM 01/28/2019    8:43 AM  PHQ9 SCORE ONLY  PHQ-9 Total Score 0 19 3    Adult vaccines due  Topic Date Due   TETANUS/TDAP  05/29/2021    Health Maintenance Due  Topic Date Due   Zoster Vaccines- Shingrix (1 of 2) Never done   COVID-19 Vaccine (4 - Mixed Product risk series) 02/02/2021   FOOT EXAM  02/24/2021   TETANUS/TDAP  05/29/2021   INFLUENZA VACCINE  09/05/2021      History/P.E. limitations: none  Adult vaccines due  Topic Date Due   TETANUS/TDAP  05/29/2021    Diabetes Health Maintenance Due  Topic Date Due   FOOT EXAM  02/24/2021   HEMOGLOBIN A1C  02/24/2022   OPHTHALMOLOGY EXAM  08/23/2022    Health Maintenance Due  Topic Date Due   Zoster Vaccines- Shingrix (1 of 2) Never done   COVID-19 Vaccine (4 - Mixed Product risk series) 02/02/2021   FOOT EXAM  02/24/2021   TETANUS/TDAP  05/29/2021   INFLUENZA VACCINE  09/05/2021     No  chief complaint on file.    --------------------------------------------------------------------------------------------------------------------------------------------- Visit Problem List with A/P  Post-viral cough syndrome New concern Onset cough after Covid infection last week in July. Nonproductive Worse lying down. 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) with little improvement Not smoker No underlying lung disease  Exam HEENT: TMs translucent with good LM, pale nasal mucosa, o/p wo e/e, no Cx LAN Lung: BCTA, no crackles or wheeze Cor: RRR, no murmur Ext: no edema  A/ Likely Post-viral cough syndrome P/ Prescription azelastine-fluticasone NS monitor   Yeast vaginitis New problem since start of SGLT2i White vaginal discharge No odor No vulvovaginal itching or pain Did not complete trial of OTC antifungal IV therapy because of unpleasantness of application A/ Suspect yeast vaginitis secondary to SGLT2i therapy P/ Prescription Diflucan 150 mg tab x 1.  Refills available for self initiation for recurrent symptoms/signs. Let Dr Bettyanne Dittman know if discharge does not improve.

## 2021-10-06 NOTE — Telephone Encounter (Signed)
A Prior Authorization was initiated for this patients Azelastine-Fluticasone 137-50MCG/ACT suspension through CoverMyMeds.   Key: [id number]

## 2021-10-06 NOTE — Telephone Encounter (Signed)
Patient calls nurse line reporting recently prescribed nasal spray needs a prior authorization.   Will forward to [place] to initiate a PA for Azelastine-Fluticasone.

## 2021-10-10 ENCOUNTER — Encounter: Payer: 59 | Admitting: Occupational Therapy

## 2021-10-10 NOTE — Telephone Encounter (Addendum)
Prior Auth for patients medication Azelastine-Fluticasone 137-50MCG/ACT suspension denied by [employer] via CoverMyMeds.   Reason: This medicine is covered only if: All of the following: (1)Your doctor submits medical records documenting that you have had an inadequate response, failed, or cannot use fluticasone (generic Flonase) used in combination with azelastine (generic Astelin) (date and duration of trial must be provided). (2)Your doctor provides an explanation for how the requested medication will work for you if you have failed on the covered therapeutically equivalent product which has the same active ingredient (for example, you had an adverse reaction to an inactive ingredient in the alternative product).  CoverMyMeds Key: [id number]

## 2021-10-11 MED ORDER — AZELASTINE HCL 0.1 % NA SOLN: 2.0000 | Freq: Two times a day (BID) | NASAL | 12 refills | Status: DC

## 2021-10-11 NOTE — Addendum Note (Signed)
Addended byLissa Morales D on: 10/11/2021 09:58 AM   Modules accepted: Orders

## 2021-10-11 NOTE — Telephone Encounter (Signed)
Please let Ms Astorino know that Dr Chilton Sallade changed her nasal spray to Azelastine only, without fluticasone.  Her Insurance should cover it.

## 2021-10-12 ENCOUNTER — Encounter: Payer: 59 | Admitting: Occupational Therapy

## 2021-10-12 NOTE — Telephone Encounter (Signed)
Spoke with patient. She informed me that she picked up the new Rx. Salvatore Marvel, CMA

## 2021-10-16 ENCOUNTER — Telehealth: Payer: Self-pay | Admitting: *Deleted

## 2021-10-16 DIAGNOSIS — N63 Unspecified lump in unspecified breast: Secondary | ICD-10-CM

## 2021-10-16 NOTE — Telephone Encounter (Signed)
Patient called and needs an order placed for diagnostic mammogram and ultrasound for right breast lump.  She is due for her yearly but since she has a issue, she needs diagnostics ordered by PCP.  Will forward to MD.  Imaging pended below.  Shericka Johnstone,CMA

## 2021-10-17 ENCOUNTER — Encounter: Payer: 59 | Admitting: Occupational Therapy

## 2021-10-17 NOTE — Telephone Encounter (Signed)
Attempted to reach patient . No answer. LVM for patient to call the office to inform why she feels the need for a mammogram? Salvatore Marvel, CMA

## 2021-10-17 NOTE — Telephone Encounter (Signed)
Please contact Alicia West to find out the reason she believes she needs a diagnostic mammogram.   For example, has she felt a lump in her breast?

## 2021-10-17 NOTE — Telephone Encounter (Signed)
Patient felt a lump in her breast and informed the intake at [facility] when she tried to schedule her regular mammogram.  Johnney Ou

## 2021-10-18 NOTE — Telephone Encounter (Signed)
Patient returns call to nurse line. Advised that provider placed orders and that she can call the breast center for scheduling.   Talbot Grumbling, RN

## 2021-10-18 NOTE — Telephone Encounter (Signed)
Order for diagnostic mammography for breast lump signed

## 2021-10-19 ENCOUNTER — Encounter: Payer: 59 | Admitting: Occupational Therapy

## 2021-10-24 ENCOUNTER — Encounter: Payer: 59 | Admitting: Occupational Therapy

## 2021-10-26 ENCOUNTER — Encounter: Payer: Self-pay | Admitting: Occupational Therapy

## 2021-10-26 ENCOUNTER — Encounter: Payer: 59 | Admitting: Occupational Therapy

## 2021-10-26 NOTE — Therapy (Signed)
[facility] [address] [place], [place], [address] Phone: [phone]   Fax:  [phone]  Patient Details  Name: Alicia West MRN: [id number] Date of Birth: 11-01-1970 Referring Provider:  No ref. provider found  Encounter Date: 10/26/2021  OCCUPATIONAL THERAPY DISCHARGE SUMMARY  Visits from Start of Care: 1 (eval)  Current functional level related to goals / functional outcomes: See eval as pt did not return after eval   Remaining deficits: See eval as pt did not return after eval   Education / Equipment: Not completed as pt did not return  Plan: Patient agrees to discharge.  Patient goals were not met. Patient is being discharged due to not returning since the last visit.  Pt did not return after eval. Received message that [facility] called and cancelled all of her remaining appts due to her work schedule and pending testing results.  As pt has not been seen in >30 days and has no scheduled appts, pt will be discharged at this time.           Vianne Bulls, OTR/L 10/26/2021, 2:28 PM  [facility] [address] [place], [place], [address] Phone: [phone]   Fax:  [phone]

## 2021-10-30 ENCOUNTER — Ambulatory Visit
Admission: RE | Admit: 2021-10-30 | Discharge: 2021-10-30 | Disposition: A | Discharge status: Home / Self Care | Payer: 59 | Source: Ambulatory Visit | Attending: Family Medicine | Admitting: Family Medicine

## 2021-10-30 DIAGNOSIS — N63 Unspecified lump in unspecified breast: Secondary | ICD-10-CM

## 2021-11-02 ENCOUNTER — Telehealth: Payer: Self-pay

## 2021-11-02 NOTE — Telephone Encounter (Signed)
Patient calls nurse line reporting mold concern in her apartment.   Patient reports she has been consistently sick all year and feels "the mold" is a contributing factor. Patient reports persistent sinus infections and cough.   Patient is requesting to see PCP for evaluation and discuss ways to test for mold exposure.   Patient scheduled with PCP in October.

## 2021-11-15 ENCOUNTER — Other Ambulatory Visit: Payer: Self-pay | Admitting: Family Medicine

## 2021-11-15 DIAGNOSIS — I10 Essential (primary) hypertension: Secondary | ICD-10-CM

## 2021-11-21 ENCOUNTER — Other Ambulatory Visit: Payer: Self-pay | Admitting: Family Medicine

## 2021-11-21 DIAGNOSIS — E1165 Type 2 diabetes mellitus with hyperglycemia: Secondary | ICD-10-CM

## 2021-11-21 DIAGNOSIS — E7800 Pure hypercholesterolemia, unspecified: Secondary | ICD-10-CM

## 2021-11-30 ENCOUNTER — Encounter: Payer: Self-pay | Admitting: Family Medicine

## 2021-11-30 ENCOUNTER — Ambulatory Visit (INDEPENDENT_AMBULATORY_CARE_PROVIDER_SITE_OTHER): Payer: 59 | Admitting: Family Medicine

## 2021-11-30 VITALS — BP 118/80 | HR 84 | Ht 69.0 in | Wt 227.1 lb

## 2021-11-30 DIAGNOSIS — I82721 Chronic embolism and thrombosis of deep veins of right upper extremity: Secondary | ICD-10-CM

## 2021-11-30 DIAGNOSIS — I152 Hypertension secondary to endocrine disorders: Secondary | ICD-10-CM

## 2021-11-30 DIAGNOSIS — M549 Dorsalgia, unspecified: Secondary | ICD-10-CM

## 2021-11-30 DIAGNOSIS — E1140 Type 2 diabetes mellitus with diabetic neuropathy, unspecified: Secondary | ICD-10-CM

## 2021-11-30 DIAGNOSIS — Z794 Long term (current) use of insulin: Secondary | ICD-10-CM | POA: Diagnosis not present

## 2021-11-30 DIAGNOSIS — R109 Unspecified abdominal pain: Secondary | ICD-10-CM

## 2021-11-30 DIAGNOSIS — E1159 Type 2 diabetes mellitus with other circulatory complications: Secondary | ICD-10-CM

## 2021-11-30 LAB — POCT URINALYSIS DIP (MANUAL ENTRY)
Bilirubin, UA: NEGATIVE
Blood, UA: NEGATIVE
Glucose, UA: >=1000 mg/dL — AB
Ketones, POC UA: NEGATIVE mg/dL
Leukocytes, UA: NEGATIVE
Nitrite, UA: NEGATIVE
Protein Ur, POC: NEGATIVE mg/dL
Spec Grav, UA: 1.02 (ref 1.010–1.025)
Urobilinogen, UA: 0.2 E.U./dL
pH, UA: 5.5 (ref 5.0–8.0)

## 2021-11-30 LAB — POCT GLYCOSYLATED HEMOGLOBIN (HGB A1C): HbA1c, POC (controlled diabetic range): 7 % (ref 0.0–7.0)

## 2021-11-30 LAB — POCT UA - MICROSCOPIC ONLY
RBC, Urine, Miroscopic: NONE SEEN (ref 0–2)
WBC, Ur, HPF, POC: NONE SEEN (ref 0–5)

## 2021-11-30 NOTE — Patient Instructions (Addendum)
Great blood sugar control  Keep it up. A1c 7.0% (down from 8.8% in June)  I believe your back a dn leg pain is from Trigger Points in your back.  The treatment is best done with stretching and physical therapy. A referral to Physical Therapy was sent.  Make sure to check out at the front desk before you leave.  If you had blood tests today, Dr Ahlia Lemanski will send you a MyChart message or a letter if the results are normal.  Otherwise, he will give you a call.   If Dr Madisson Kulaga ordered you to have a referral, you should hear from the referral's office to schedule an appointment.    If you have not heard from the referral's office within 5 business-days, please let Dr Moranda Billiot know by sending him a message through [facility], or calling the [facility] [phone]) to leave him a message.

## 2021-12-01 ENCOUNTER — Telehealth: Payer: Self-pay

## 2021-12-01 ENCOUNTER — Encounter: Payer: Self-pay | Admitting: Family Medicine

## 2021-12-01 DIAGNOSIS — M549 Dorsalgia, unspecified: Secondary | ICD-10-CM | POA: Insufficient documentation

## 2021-12-01 HISTORY — DX: Dorsalgia, unspecified: M54.9

## 2021-12-01 NOTE — Assessment & Plan Note (Signed)
Established problem Dr Lindi Adie recommended stopping DOAC after 6 months on office visit 02/09/21 After discussion with Alicia West about continued risk of repeat arm DVT given persistent peripheral neuromas that may have precipitated the DVT thru obstruction, we came to decision previously to continue Eliquis until the neuromas have resolved.  Patient followed at [facility] for neuromas.  Continuing Eliquis for now.

## 2021-12-01 NOTE — Assessment & Plan Note (Addendum)
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   Pattern: intermittent Course: stable  Radiation: around to right lower abdomin to groin Worse with torso rotation, and lateral bending at waist Relief: has not tried anything Precipitant: no recell of acute injury  Prior Diagnostic Testing or Treatments:  Pelvic AP with bilateral mild hip joint degeneration Relevant PMH/PSH: History of right groin pain attributed to hip pathology going back to 2018  Exam Back Exam: Inspection: no loss of lumbar lordosis Motion: careful clim onto exam table, saying she is fearful of causing the back pain SLR seated: normal bilaterally                           'Palpable tenderness: firm tneder points along lateral edges of right lumbar paraspinal muscle bundle. Tender points radiation to right groin.    Lab UA: unremarkable except for glycosuria (patient on SGLT2i med) A/  Trigger points along lateral lumbar edge of paraspinal muscle bundle. Not an systemic NSAID candidate because of her CKD P/ Referral to Physical Therapy for soft tissue release, consideration of dry needling, though patient is on DOAC anticoagulation for UE DVT.  Ms Cogan is at low risk of UE DVT recurrence if holding her Eliquis for 5 days to allow dry needling therapy.

## 2021-12-01 NOTE — Assessment & Plan Note (Signed)
Established problem that has improved Wt Readings from Last 3 Encounters:  11/30/21 227 lb 2 oz (103 kg)  10/05/21 231 lb (104.8 kg)  08/24/21 235 lb (106.6 kg)   Eight pound weight loss on semaglutide 2 mg [place] weekly and empagliflozin 10 mg daily for renal protection  Continue current medication regiment.

## 2021-12-01 NOTE — Progress Notes (Signed)
Alicia West is alone Sources of clinical information for visit is/are patient. Nursing assessment for this office visit was reviewed with the patient for accuracy and revision.     Previous Report(s) Reviewed: none     08/13/2019    9:02 AM  Depression screen PHQ 2/9  Decreased Interest 0  Down, Depressed, Hopeless 0  PHQ - 2 Score 0   Flowsheet Row Office Visit from 07/29/2019 in [place] Office Visit from 04/04/2016 in [place] Office Visit from 01/25/2016 in [facility]  Thoughts that you would be better off dead, or of hurting yourself in some way Not at all Not at all Not at all  PHQ-9 Total Score 19 0 0          08/13/2019    9:02 AM 01/28/2019    8:43 AM 04/16/2018    3:05 PM 01/31/2018    8:36 AM 01/07/2018    3:04 PM  Fall Risk   Falls in the past year? 0 0 0 0 0  Number falls in past yr: 0 0 0  0  Injury with Fall?   0  0  Follow up Falls evaluation completed  Falls evaluation completed         08/13/2019    9:02 AM 07/29/2019    3:24 PM 01/28/2019    8:43 AM  PHQ9 SCORE ONLY  PHQ-9 Total Score 0 19 3    Adult vaccines due  Topic Date Due   TETANUS/TDAP  05/29/2021    Health Maintenance Due  Topic Date Due   Zoster Vaccines- Shingrix (1 of 2) Never done   COVID-19 Vaccine (4 - Mixed Product risk series) 02/02/2021   FOOT EXAM  02/24/2021   TETANUS/TDAP  05/29/2021      History/P.E. limitations: none  Adult vaccines due  Topic Date Due   TETANUS/TDAP  05/29/2021    Diabetes Health Maintenance Due  Topic Date Due   FOOT EXAM  02/24/2021   HEMOGLOBIN A1C  06/01/2022   OPHTHALMOLOGY EXAM  08/23/2022    Health Maintenance Due  Topic Date Due   Zoster Vaccines- Shingrix (1 of 2) Never done   COVID-19 Vaccine (4 - Mixed Product risk series) 02/02/2021   FOOT EXAM  02/24/2021   TETANUS/TDAP  05/29/2021     Chief Complaint  Patient presents with   Flank Pain    X 2 months      --------------------------------------------------------------------------------------------------------------------------------------------- Visit Problem List with A/P  No problem-specific Assessment & Plan notes found for this encounter.

## 2021-12-01 NOTE — Assessment & Plan Note (Signed)
Established problem that has improved and has meet goal of A1c < 7.5% Lab Results  Component Value Date   HGBA1C 7.0 11/30/2021     Continue current medication regiment metformin 1000 mg twice a day, empagliflozin 10 mg daily, semaglutide 2 mg [place] weekly and glargine 34 units Tanana qam.    May be able to start titrating down glargine if glycemic control remains stable.

## 2021-12-01 NOTE — Telephone Encounter (Signed)
Patient calls nurse line requesting to schedule a lab only apt.   Patient reports she left her visit yesterday and forgot to stop by the lab.   I do not see where patient has lab orders. Patient reports she told PCP at visit she cancelled apt with Nephrology due to PCP apt. She reports PCP told her we could draw whatever labs they were planning on doing.   Will forward to PCP.

## 2021-12-01 NOTE — Assessment & Plan Note (Signed)
Established problem Well Controlled and is at goal of <130/80 in setting of CKD. No signs of complications, medication side effects, or red flags. Continue current regiment of Hyzaar 100-25, Spironolactone (use is primarily for acne), and carvedilol 25 mg bid - some help from empagliflozin 10 mg daily.

## 2021-12-04 ENCOUNTER — Other Ambulatory Visit: Payer: Self-pay | Admitting: Family Medicine

## 2021-12-04 DIAGNOSIS — N1831 Chronic kidney disease, stage 3a: Secondary | ICD-10-CM

## 2021-12-04 NOTE — Telephone Encounter (Signed)
Future renal labs ordered  May collect anytime Please contact patient to schedule a lab visit.

## 2021-12-04 NOTE — Progress Notes (Unsigned)
Future Surveillance labs drawn for Chronic Kidney Disease IIIa without proteinuria: Renal Panel, Vit D25, iPTH

## 2021-12-05 NOTE — Telephone Encounter (Signed)
Made appt for 11/2 at 8:30 for lab visit. Salvatore Marvel, CMA

## 2021-12-07 ENCOUNTER — Other Ambulatory Visit: Payer: 59

## 2021-12-07 DIAGNOSIS — N1831 Chronic kidney disease, stage 3a: Secondary | ICD-10-CM

## 2021-12-08 LAB — PARATHYROID HORMONE, INTACT (NO CA): PTH: 46 pg/mL (ref 15–65)

## 2021-12-08 LAB — VITAMIN D 25 HYDROXY (VIT D DEFICIENCY, FRACTURES): Vit D, 25-Hydroxy: 27.2 ng/mL — ABNORMAL LOW (ref 30.0–100.0)

## 2021-12-08 LAB — RENAL FUNCTION PANEL
Albumin: 4.2 g/dL (ref 3.8–4.9)
BUN/Creatinine Ratio: 14 (ref 9–23)
BUN: 22 mg/dL (ref 6–24)
CO2: 22 mmol/L (ref 20–29)
Calcium: 9.7 mg/dL (ref 8.7–10.2)
Chloride: 101 mmol/L (ref 96–106)
Creatinine, Ser: 1.54 mg/dL — ABNORMAL HIGH (ref 0.57–1.00)
Glucose: 144 mg/dL — ABNORMAL HIGH (ref 70–99)
Phosphorus: 3.2 mg/dL (ref 3.0–4.3)
Potassium: 4.1 mmol/L (ref 3.5–5.2)
Sodium: 137 mmol/L (ref 134–144)
eGFR: 41 mL/min/{1.73_m2} — ABNORMAL LOW (ref >59)

## 2021-12-25 ENCOUNTER — Other Ambulatory Visit: Payer: Self-pay | Admitting: Family Medicine

## 2021-12-25 DIAGNOSIS — D75839 Thrombocytosis, unspecified: Secondary | ICD-10-CM

## 2021-12-25 DIAGNOSIS — D509 Iron deficiency anemia, unspecified: Secondary | ICD-10-CM

## 2022-01-01 ENCOUNTER — Other Ambulatory Visit: Payer: Self-pay | Admitting: Family Medicine

## 2022-01-01 DIAGNOSIS — I82621 Acute embolism and thrombosis of deep veins of right upper extremity: Secondary | ICD-10-CM

## 2022-01-01 DIAGNOSIS — I10 Essential (primary) hypertension: Secondary | ICD-10-CM

## 2022-01-11 ENCOUNTER — Other Ambulatory Visit: Payer: Self-pay | Admitting: Family Medicine

## 2022-01-11 DIAGNOSIS — E7800 Pure hypercholesterolemia, unspecified: Secondary | ICD-10-CM

## 2022-01-12 NOTE — Telephone Encounter (Signed)
Taking Atorvastatin one tab twice weekly because of history of myalgias intolerance

## 2022-01-16 ENCOUNTER — Encounter: Payer: Self-pay | Admitting: Neurosurgery

## 2022-01-17 ENCOUNTER — Other Ambulatory Visit: Payer: 59

## 2022-01-17 ENCOUNTER — Telehealth: Payer: Self-pay | Admitting: *Deleted

## 2022-01-17 DIAGNOSIS — Z77120 Contact with and (suspected) exposure to mold (toxic): Secondary | ICD-10-CM

## 2022-01-17 DIAGNOSIS — J309 Allergic rhinitis, unspecified: Secondary | ICD-10-CM

## 2022-01-17 NOTE — Telephone Encounter (Signed)
Referral to Allergist sent. Please let patient know this.

## 2022-01-17 NOTE — Telephone Encounter (Signed)
Patient LM on referral line stating that she would like to be referred to an allergist due to increased mold in her home.  She states that this has been an ongoing problem and wants to be allergy tested.  Will forward to MD.  Johnney Ou

## 2022-01-19 NOTE — Telephone Encounter (Signed)
Attempted to reach patient. No answer. LVM of note. Salvatore Marvel, CMA

## 2022-02-03 ENCOUNTER — Other Ambulatory Visit: Payer: Self-pay | Admitting: Family Medicine

## 2022-02-03 DIAGNOSIS — E1149 Type 2 diabetes mellitus with other diabetic neurological complication: Secondary | ICD-10-CM

## 2022-03-09 ENCOUNTER — Telehealth: Payer: Self-pay

## 2022-03-09 NOTE — Telephone Encounter (Signed)
Prior Auth for patients medication OZEMPIC approved by [employer] from 03/09/22 to 03/08/23.  CoverMyMeds Key: [id number]

## 2022-03-12 ENCOUNTER — Other Ambulatory Visit: Payer: Self-pay | Admitting: Family Medicine

## 2022-03-12 DIAGNOSIS — I82621 Acute embolism and thrombosis of deep veins of right upper extremity: Secondary | ICD-10-CM

## 2022-04-13 ENCOUNTER — Encounter: Payer: Self-pay | Admitting: Physician Assistant

## 2022-04-16 ENCOUNTER — Telehealth: Payer: Self-pay

## 2022-04-16 DIAGNOSIS — E1149 Type 2 diabetes mellitus with other diabetic neurological complication: Secondary | ICD-10-CM

## 2022-04-16 DIAGNOSIS — Z794 Long term (current) use of insulin: Secondary | ICD-10-CM

## 2022-04-16 DIAGNOSIS — E1140 Type 2 diabetes mellitus with diabetic neuropathy, unspecified: Secondary | ICD-10-CM

## 2022-04-16 DIAGNOSIS — E11311 Type 2 diabetes mellitus with unspecified diabetic retinopathy with macular edema: Secondary | ICD-10-CM

## 2022-04-16 DIAGNOSIS — E113413 Type 2 diabetes mellitus with severe nonproliferative diabetic retinopathy with macular edema, bilateral: Secondary | ICD-10-CM

## 2022-04-16 NOTE — Telephone Encounter (Signed)
Patient calls nurse line regarding cost of Ozempic and Jardiance.   She reports that Alicia West will cost her ~$400 and Ozempic ~$700. She is asking if we could assist her with samples on these medications. Advised that we typically do not receive samples of Jardiance, however, I would send message to pharmacy team for med assistance options, as well as samples for Ozempic.   Please advise.   Talbot Grumbling, RN

## 2022-04-17 ENCOUNTER — Encounter: Payer: Self-pay | Admitting: Physician Assistant

## 2022-04-17 ENCOUNTER — Other Ambulatory Visit (HOSPITAL_COMMUNITY): Payer: Self-pay

## 2022-04-17 NOTE — Telephone Encounter (Signed)
[employer] copay for Ozempic '2mg'$  dose pens - $949 Jardiance $599  Copay card available for Jardiance, will enroll patient & call into pharmacy.   Copay card available for ozempic, but only covers up to $150 of patients out of pocket cost.

## 2022-04-19 ENCOUNTER — Other Ambulatory Visit (HOSPITAL_COMMUNITY): Payer: Self-pay

## 2022-04-19 NOTE — Telephone Encounter (Signed)
Spoke with patient regarding copay issues.   Patient had previously obtained a Jardiance copay card but it only took $175 off of her copay.   Patient has about a $1500+ deductible remaining. She would like to know what other medication options could be given.

## 2022-04-20 ENCOUNTER — Ambulatory Visit (INDEPENDENT_AMBULATORY_CARE_PROVIDER_SITE_OTHER): Payer: BC Managed Care – PPO | Admitting: Internal Medicine

## 2022-04-20 ENCOUNTER — Other Ambulatory Visit: Payer: Self-pay

## 2022-04-20 ENCOUNTER — Encounter: Payer: Self-pay | Admitting: Physician Assistant

## 2022-04-20 ENCOUNTER — Other Ambulatory Visit (HOSPITAL_COMMUNITY): Payer: Self-pay

## 2022-04-20 ENCOUNTER — Encounter: Payer: Self-pay | Admitting: Internal Medicine

## 2022-04-20 VITALS — BP 124/80 | HR 84 | Temp 98.1°F | Resp 18 | Ht 69.0 in | Wt 237.1 lb

## 2022-04-20 DIAGNOSIS — J343 Hypertrophy of nasal turbinates: Secondary | ICD-10-CM | POA: Diagnosis not present

## 2022-04-20 DIAGNOSIS — R0982 Postnasal drip: Secondary | ICD-10-CM

## 2022-04-20 DIAGNOSIS — K2100 Gastro-esophageal reflux disease with esophagitis, without bleeding: Secondary | ICD-10-CM

## 2022-04-20 DIAGNOSIS — J301 Allergic rhinitis due to pollen: Secondary | ICD-10-CM

## 2022-04-20 MED ORDER — FLUTICASONE PROPIONATE 50 MCG/ACT NA SUSP: 2.0000 | Freq: Every day | NASAL | 5 refills | Status: AC

## 2022-04-20 MED ORDER — OMEPRAZOLE 20 MG PO CPDR: DELAYED_RELEASE_CAPSULE | ORAL | 3 refills | Status: DC

## 2022-04-20 MED ORDER — AZELASTINE HCL 0.1 % NA SOLN: 1.0000 | Freq: Two times a day (BID) | NASAL | 5 refills | Status: DC | PRN

## 2022-04-20 MED ORDER — OLOPATADINE HCL 0.2 % OP SOLN: 1.0000 [drp] | Freq: Every day | OPHTHALMIC | 5 refills | Status: DC | PRN

## 2022-04-20 MED ORDER — CETIRIZINE HCL 10 MG PO TABS: 10.0000 mg | ORAL_TABLET | Freq: Every day | ORAL | 5 refills | Status: DC

## 2022-04-20 NOTE — Patient Instructions (Addendum)
Allergic Rhinitis: - Positive skin test 04/2022: weeds - Avoidance measures discussed. - Use nasal saline rinses before nose sprays such as with Neilmed Sinus Rinse.  Use distilled water.   - Use Flonase 2 sprays each nostril daily. Aim upward and outward. - Use Azelastine 1-2 sprays each nostril twice daily as needed. Aim upward and outward. - Use Zyrtec 10 mg daily.  If this causes sleepiness, you could also switch to Allegra 180mg  daily.   - For eyes, use Olopatadine or Ketotifen 1 eye drop daily as needed for itchy, watery eyes.  Available over the counter, if not covered by insurance.  - Consider allergy shots as long term control of your symptoms by teaching your immune system to be more tolerant of your allergy triggers  GERD -Continue Omperazole 20mg  daily.  -Avoid lying down for at least two hours after a meal or after drinking acidic beverages, like soda, or other caffeinated beverages. This can help to prevent stomach contents from flowing back into the esophagus. -Keep your head elevated while you sleep. Using an extra pillow or two can also help to prevent reflux. -Eat smaller and more frequent meals each day instead of a few large meals. This promotes digestion and can aid in preventing heartburn. -Wear loose-fitting clothes to ease pressure on the stomach, which can worsen heartburn and reflux. -Reduce excess weight around the midsection. This can ease pressure on the stomach. Such pressure can force some stomach contents back up the esophagus.    ALLERGEN AVOIDANCE MEASURES  Pollen Avoidance Pollen levels are highest during the mid-day and afternoon.  Consider this when planning outdoor activities. Avoid being outside when the grass is being mowed, or wear a mask if the pollen-allergic person must be the one to mow the grass. Keep the windows closed to keep pollen outside of the home. Use an air conditioner to filter the air. Take a shower, wash hair, and change clothing  after working or playing outdoors during pollen season.

## 2022-04-20 NOTE — Progress Notes (Signed)
NEW PATIENT  Date of Service/Encounter:  04/20/22  Consult requested by: McDiarmid, Blane Ohara, MD   Subjective:   Alicia West (DOB: 05-May-1970) is a 52 y.o. female who presents to the clinic on 04/20/2022 with a chief complaint of Allergic Rhinitis  .    History obtained from: chart review and patient.    Rhinitis:  Started around age 89.  Symptoms include: nasal congestion, rhinorrhea, post nasal drainage, sneezing, watery eyes, and itchy eyes  post nasal drainage with cough Occurs year-round Potential triggers: water damage in apartment with mold Treatments tried:  Flonase previously but then switched to Azelastine  Azelastine PRN Benadryl PRN Zyrtec PRN but makes her sleepy Last use of anti histamines was few weeks ago   Previous allergy testing: no History of sinus surgery: no Nonallergic triggers: none   GERD: Also reports history of reflux, taking omeprazole daily which does help.  EGD 2021 with mild esophagitis and hiatal hernia.   Past Medical History: Past Medical History:  Diagnosis Date   Acne vulgaris 10/01/2017   Allergy    seasonal sinus allergies   anemia of chronic kidney 12/23/2020   Anemia of chronic kidney failure, stage 3 (moderate) ([place]) 02/09/2021   Diagnosis by Dr Domingo Dimes (Hem-Onc) 02/09/21   ANEMIA, IRON DEFICIENCY, UNSPEC. 04/04/2006   Qualifier: Diagnosis of  By: Samara Snide     Arm DVT (deep venous thromboembolism), chronic, right ([place]) 12/23/2020   Bilateral senile cataracts 11/20/2018   Mixed OU   Binge-eating disorder, moderate 03/17/2013   Breast lump 07/22/2009   Qualifier: Diagnosis of  By: Netty Starring  MD, Kanhka     Chronic constipation 02/21/2015   Chronic kidney disease, stage 3a ([place]) 02/26/2020   CKD stage 3 secondary to diabetes ([place]) 10/17/2018   Deep vein thrombosis (DVT) of brachial vein of right upper extremity ([place]) 11/29/2020   DEPRESSIVE DISORDER, NOS 04/04/2006   Qualifier: Diagnosis of  By: Samara Snide      Diabetes mellitus    on meds   Diabetic macular edema ([place])    by patient report 10/27/2019   Diabetic macular edema of both eyes ([place]) 11/20/2018   History VEGF injection and photolaser therapy   Diabetic neuropathy ([place]) 10/17/2018   Diabetic peripheral neuropathy ([place])    per patient   Diabetic visual loss: severe vision impairment of both eyes, with macular edema, with severe nonproliferative retinopathy, associated with type 2 diabetes mellitus ([place]) [id number]       Folliculitis [id number]   GERD with esophagitis 01/20/2020   EGD Dr Scarlette Shorts 01/2020   Heavy menses    Pt reports she was to get a hysterectomy   Herpes labialis 09/06/2010   Herpes simplex labialis    History of glaucoma    By patient report.    History of HPV infection 08/18/2019   Hydradenitis 08/25/2021   Hypercalcemia 08/13/2019   Hyperlipidemia    diet controlled/not taking meds for this   Hypersomnia with sleep apnea 10/06/2019   Hypertension    on meds   Hypertension associated with diabetes ([place]) 04/04/2006   Qualifier: Diagnosis of  By: Samara Snide     Hypertensive retinopathy    OU   Hypertensive retinopathy of both eyes 11/20/2018   Iron deficiency anemia 04/04/2006   Qualifier: Diagnosis of  By: Samara Snide     Loud snoring 10/06/2019   Macular degeneration 03/17/2012   Menorrhagia 04/04/2006   Qualifier: Diagnosis of  By: Samara Snide  Mixed hyperlipidemia due to type 2 diabetes mellitus ([place]) 12/01/2008   Qualifier: Diagnosis of  By: Netty Starring  MD, Kanhka     Mixed incontinence 10/17/2018   Morbid obesity ([place]) 05/01/2013   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   Nerve sheath tumor, Bilateral humeral arm 04/10/2021   Neuromuscular disorder (HCC)    Obstructive sleep apnea 11/27/2019   Osteoporosis     bilateral hips R>L   Paroxysmal supraventricular tachycardia 10/26/2014   PONV (postoperative nausea and vomiting)    POSITIVE PPD 10/18/2009   Qualifier: Diagnosis of  By: Zebedee Iba NP, Manuela Schwartz     Post-viral cough syndrome 10/06/2021   Prurigo nodularis 10/17/2018   Recurrent upper respiratory infection (URI)    RHINITIS, ALLERGIC 04/04/2006   Qualifier: Diagnosis of  By: Samara Snide     Right arm pain 11/25/2020   Sleep apnea    Sleep related choking sensation 10/06/2019   TB (tuberculosis) contact    + skin test,/- chest x-ray - in mid 2000's   Thrombocytosis 08/18/2019   Tuberculosis    postitive 2011-[facility]-cleared per pt   Tubular adenoma of colon 2021   2 mm sessile, Rikki Spearing MD ([facility])   Type 2 diabetes mellitus with diabetic neuropathy, unspecified ([place]) 10/18/2009   Fax'd office visit notes and prescription for CGM to [facility] 04/18/21  Cannot tolerate Metformin at high doses b/c of myalgias and diarrhea.   Can take metformin at current 500mg  bid dose.   Visual impairment of left eye 08/18/2019    Past Surgical History: Past Surgical History:  Procedure Laterality Date   REDUCTION MAMMAPLASTY     TONSILLECTOMY     TONSILLECTOMY     WISDOM TOOTH EXTRACTION      Family History: Family History  Problem Relation Age of Onset   Asthma Mother    Hypertension Mother    Diabetes Mellitus II Mother    Congestive Heart Failure Mother        Deceased from complications of CHF   Hypertension Father    Diabetes Mellitus II Father    Colon polyps Father 61   Colon cancer Father 72   Diabetes Mellitus II Brother    Colon polyps Brother 42   Colon polyps Maternal Uncle 62   Colon cancer Maternal Uncle 62   Esophageal cancer Neg Hx    Stomach cancer Neg Hx    Rectal cancer Neg Hx     Social History:  Lives in a 1971 year apartment Flooring in bedroom: carpet Pets: none Tobacco use/exposure: none Job: client care- [employer]  Medication List:   Allergies as of 04/20/2022       Reactions   Ace Inhibitors Cough   Other reaction(s): Other (See Comments)   Amlodipine Swelling   edema Other reaction(s): Other (See Comments) edema   Lipitor [atorvastatin] Other (See Comments)   Muscle aches in thighs   Penicillins Itching, Rash   Able to tolerate cephalosporins Other reaction(s): Other (See Comments) Able to tolerate cephalosporins   Hydrocodone-acetaminophen Nausea And Vomiting   Percocet [oxycodone-acetaminophen] Nausea And Vomiting        Medication List        Accurate as of April 20, 2022 12:09 PM. If you have any questions, ask your nurse or doctor.          acetaminophen  500 MG tablet Commonly known as: TYLENOL Take 500 mg by mouth every 6 (six) hours as needed.   atorvastatin 40 MG tablet Commonly known as: LIPITOR TAKE 1 TABLET BY MOUTH TWICE  WEEKLY   azelastine 0.1 % nasal spray Commonly known as: ASTELIN Place 1 spray into both nostrils 2 (two) times daily as needed for rhinitis or allergies. Use in each nostril as directed What changed:  how much to take when to take this reasons to take this Changed by: Larose Kells, MD   carvedilol 25 MG tablet Commonly known as: COREG TAKE ONE TABLET BY MOUTH TWICE A DAY WITH MEALS   cetirizine 10 MG tablet Commonly known as: ZyrTEC Allergy Take 1 tablet (10 mg total) by mouth daily. Started by: Larose Kells, MD   Clindamycin-Benzoyl Per (Refr) gel Apply to face each morning   Contour Next Test test strip Generic drug: glucose blood USE TO CHECK FOR BLOOD SUGAR THREE TIMES A DAY AS DIRECTED   Eliquis 5 MG Tabs tablet Generic drug: apixaban TAKE 1 TABLET BY MOUTH TWICE A DAY   empagliflozin 10 MG Tabs tablet Commonly known as: Jardiance Take 1 tablet (10 mg total) by mouth daily.   fluconazole 150 MG tablet Commonly known as: DIFLUCAN Take one capsule with recurrence of yeast infections.   fluticasone 50 MCG/ACT nasal spray Commonly  known as: FLONASE Place 2 sprays into both nostrils daily. Started by: Larose Kells, MD   gabapentin 100 MG capsule Commonly known as: NEURONTIN TAKE THREE CAPSULES BY MOUTH AT BEDTIME   Lancets Misc Use Contour Next Lancet to check blood glucose 3 times a day.  Dx code E11.65.   Linzess 145 MCG Caps capsule Generic drug: linaclotide TAKE ONE CAPSULE BY MOUTH DAILY   losartan-hydrochlorothiazide 100-25 MG tablet Commonly known as: HYZAAR Take 1 tablet by mouth daily.   metFORMIN 500 MG tablet Commonly known as: GLUCOPHAGE TAKE ONE TABLET BY MOUTH TWICE A DAY WITH MEALS   Novofine Pen Needle 32G X 6 MM Misc Generic drug: Insulin Pen Needle USE TO INJECT INSULIN DAILY AS INSTRUCTED   Olopatadine HCl 0.2 % Soln Apply 1 drop to eye daily as needed (itchy watery eyes). Started by: Larose Kells, MD   omeprazole 20 MG capsule Commonly known as: PRILOSEC Take 20 mg daily 30 minutes before breakfast   Ozempic (2 MG/DOSE) 8 MG/3ML Sopn Generic drug: Semaglutide (2 MG/DOSE) Inject 2 mg into the skin once a week.   prenatal vitamin w/FE, FA 29-1 MG Chew chewable tablet Chew 1 tablet by mouth daily at 12 noon.   spironolactone 25 MG tablet Commonly known as: ALDACTONE TAKE ONE TABLET BY MOUTH DAILY   Syringe (Disposable) 1 ML Misc 1 each by Does not apply route 3 (three) times daily.   Toujeo SoloStar 300 UNIT/ML Solostar Pen Generic drug: insulin glargine (1 Unit Dial) INJECT 34 UNITS UNDER THE SKIN EVERY MORNING   tretinoin 0.025 % cream Commonly known as: RETIN-A Apply to face and back nightly to tolerance.         REVIEW OF SYSTEMS: Pertinent positives and negatives discussed in HPI.   Objective:   Physical Exam: BP 124/80   Pulse 84   Temp 98.1 F (36.7 C) (Temporal)   Resp 18   Ht 5\' 9"  (1.753 m)   Wt 237 lb 1.6 oz (107.5 kg)   SpO2 97%   BMI 35.01 kg/m  Body mass index is 35.01 kg/m. GEN: alert, well developed  HEENT: clear conjunctiva, TM  grey and translucent, nose with + inferior turbinate hypertrophy, pink nasal mucosa, slight clear rhinorrhea, + cobblestoning HEART: regular rate and rhythm, no murmur LUNGS: clear to auscultation bilaterally, no coughing, unlabored respiration ABDOMEN: soft, non distended  SKIN: no rashes or lesions  Reviewed:  Followed by GI, last visit 03/08/2021 for chronic GERD with esophagitis.  On omeprazole 20mg  daily.  EGD 2021 with mild esophagitis and hiatal hernia.  Also with chronic constipation, Linzess.    10/05/2021: seen for chronic cough, after an illness.  Also has drainage.  Thought to be post viral cough syndrome. Discussed starting Flonase/Azelastine.   06/06/2021:followed by Heme/Onc for iron deficiency anemia.  Also has malignant peripheral nerve sheath tumor, neurofibromas.  Sees [facility] neurofibromatosis clinic.   Skin Testing:  Skin prick testing was placed, which includes aeroallergens/foods, histamine control, and saline control.  Verbal consent was obtained prior to placing test.  Patient tolerated procedure well.  Allergy testing results were read and interpreted by myself, documented by clinical staff. Adequate positive and negative control.  Results discussed with patient/family.  Airborne Adult Perc - 04/20/22 1020     Time Antigen Placed 1020    Allergen Manufacturer Greer    Location Back    Number of Test 58    Panel 1 Select    1. Control-Buffer 50% Glycerol Negative    2. Control-Histamine 1 mg/ml 3+    3. Albumin saline Negative    4. Harmony Negative    5. [place] Negative    6. Johnson Negative    7. [place] Blue Negative    9. Perennial Rye Negative    10. Sweet Vernal Negative    11. Timothy Negative    12. Cocklebur Negative    13. Burweed Marshelder Negative    14. Ragweed, short Negative    15. Ragweed, Giant Negative    16. Plantain,  English Negative    17. Lamb's Quarters Negative    18. Sheep Sorrell Negative    19. Rough Pigweed Negative    20.  Marsh Elder, Rough Negative    21. Mugwort, Common Negative    22. Ash mix Negative    23. Birch mix Negative    24. Beech American Negative    25. Box, Elder Negative    26. Cedar, red Negative    27. Cottonwood, [place] Negative    28. Elm mix Negative    29. Hickory Negative    30. Maple mix Negative    31. Oak, [place] mix Negative    32. Pecan Pollen Negative    33. Pine mix Negative    34. Sycamore Eastern Negative    35. [place], Black Pollen Negative    36. Alternaria alternata Negative    37. Cladosporium Herbarum Negative    38. Aspergillus mix Negative    39. Penicillium mix Negative    40. Bipolaris sorokiniana (Helminthosporium) Negative    41. Drechslera spicifera (Curvularia) Negative    42. Mucor plumbeus Negative    43. Fusarium moniliforme Negative    44. Aureobasidium pullulans (pullulara) Negative    45. Rhizopus oryzae Negative    46. Botrytis cinera Negative    47. Epicoccum nigrum Negative    48. Phoma betae Negative    49. Candida Albicans Negative    50. Trichophyton mentagrophytes Negative    51. Mite, D Farinae  5,000 AU/ml Negative    52. Mite, D Pteronyssinus  5,000 AU/ml Negative    53. Cat  Hair 10,000 BAU/ml Negative    54.  Dog Epithelia Negative    55. Mixed Feathers Negative    56. Horse Epithelia Negative    57. Cockroach, German Negative    58. Mouse Negative    59. Tobacco Leaf Negative             Intradermal - 04/20/22 1045     Time Antigen Placed 1050    Allergen Manufacturer Greer    Location Arm    Number of Test 15    Control Negative    Guatemala Negative    Johnson Negative    7 Grass Negative    Ragweed mix 2+    Weed mix 2+    Tree mix Negative    Mold 1 Negative    Mold 2 Negative    Mold 3 Negative    Mold 4 Negative    Cat Negative    Dog Negative    Cockroach Negative    Mite mix Negative               Assessment:   1. Nasal turbinate hypertrophy   2. Gastroesophageal reflux disease with  esophagitis without hemorrhage   3. Post-nasal drainage   4. Non-seasonal allergic rhinitis due to pollen     Plan/Recommendations:  Allergic Rhinitis: - Due to turbinate hypertrophy and unresponsive to OTC meds, performed skin testing to identify aeroallergen triggers.   - Positive skin test 04/2022: weeds - Avoidance measures discussed. - Use nasal saline rinses before nose sprays such as with Neilmed Sinus Rinse.  Use distilled water.   - Use Flonase 2 sprays each nostril daily. Aim upward and outward. - Use Azelastine 1-2 sprays each nostril twice daily as needed. Aim upward and outward. - Use Zyrtec 10 mg daily.  If this causes sleepiness, you could also switch to Allegra 180mg  daily.   - For eyes, use Olopatadine or Ketotifen 1 eye drop daily as needed for itchy, watery eyes.  Available over the counter, if not covered by insurance.  - Consider allergy shots as long term control of your symptoms by teaching your immune system to be more tolerant of your allergy triggers  GERD -Continue Omperazole 20mg  daily.  -Avoid lying down for at least two hours after a meal or after drinking acidic beverages, like soda, or other caffeinated beverages. This can help to prevent stomach contents from flowing back into the esophagus. -Keep your head elevated while you sleep. Using an extra pillow or two can also help to prevent reflux. -Eat smaller and more frequent meals each day instead of a few large meals. This promotes digestion and can aid in preventing heartburn. -Wear loose-fitting clothes to ease pressure on the stomach, which can worsen heartburn and reflux. -Reduce excess weight around the midsection. This can ease pressure on the stomach. Such pressure can force some stomach contents back up the esophagus.   Return in about 6 weeks (around 06/01/2022).  Harlon Flor, MD [facility]

## 2022-04-25 MED ORDER — TOUJEO SOLOSTAR 300 UNIT/ML ~~LOC~~ SOPN: 40.0000 [IU] | PEN_INJECTOR | Freq: Every day | SUBCUTANEOUS | 11 refills | Status: DC

## 2022-04-25 NOTE — Telephone Encounter (Signed)
Contacted patient to discuss medication access issues.   Patient reports she has been out of both Ozempic (Semaglutide) and Jardiance (empagliflozin) due to cost.  She reports taking her Toujeo (insulin glargine) at 34 units daily.   She reports increased appetite when not taking Ozempic and reports weight gain of ~ 9 lbs.  She requested samples.  I informed her we had no samples of any of her diabetes therapies at this time and we were not receiving as many as in the past.    She reports blood sugars are elevated to readings ~ 280mg /dl.  She agreed to increase her Toujeo (insulin glargine) to 40 units once daily.  She also agreed to schedule a follow-up appointment with Dr. McDiarmid in the near future.     When her $1500 deductible is met, I believe that restarting both Ozempic and Jardiance should be planned.  I am happy to help if needed.   Today, new prescription for higher dose of Toujeo (insulin glargine) provided.

## 2022-04-25 NOTE — Addendum Note (Signed)
Addended by: Leavy Cella on: 04/25/2022 02:27 PM   Modules accepted: Orders

## 2022-04-26 NOTE — Telephone Encounter (Signed)
Reviewed and agree with Dr Koval's plan.   

## 2022-05-14 ENCOUNTER — Other Ambulatory Visit: Payer: Self-pay | Admitting: Family Medicine

## 2022-05-14 DIAGNOSIS — I10 Essential (primary) hypertension: Secondary | ICD-10-CM

## 2022-05-18 ENCOUNTER — Other Ambulatory Visit: Payer: Self-pay | Admitting: Family Medicine

## 2022-05-18 DIAGNOSIS — I82621 Acute embolism and thrombosis of deep veins of right upper extremity: Secondary | ICD-10-CM

## 2022-05-21 ENCOUNTER — Encounter: Payer: Self-pay | Admitting: Physician Assistant

## 2022-05-21 ENCOUNTER — Ambulatory Visit (INDEPENDENT_AMBULATORY_CARE_PROVIDER_SITE_OTHER): Payer: BLUE CROSS/BLUE SHIELD | Admitting: Family Medicine

## 2022-05-21 ENCOUNTER — Telehealth: Payer: Self-pay

## 2022-05-21 VITALS — BP 116/71 | HR 91 | Ht 69.0 in | Wt 237.4 lb

## 2022-05-21 DIAGNOSIS — D500 Iron deficiency anemia secondary to blood loss (chronic): Secondary | ICD-10-CM

## 2022-05-21 DIAGNOSIS — B3731 Acute candidiasis of vulva and vagina: Secondary | ICD-10-CM

## 2022-05-21 DIAGNOSIS — E1140 Type 2 diabetes mellitus with diabetic neuropathy, unspecified: Secondary | ICD-10-CM | POA: Diagnosis not present

## 2022-05-21 DIAGNOSIS — N1831 Chronic kidney disease, stage 3a: Secondary | ICD-10-CM

## 2022-05-21 DIAGNOSIS — Z794 Long term (current) use of insulin: Secondary | ICD-10-CM

## 2022-05-21 LAB — POCT GLYCOSYLATED HEMOGLOBIN (HGB A1C): HbA1c, POC (controlled diabetic range): 10.8 % — AB (ref 0.0–7.0)

## 2022-05-21 LAB — POCT URINALYSIS DIP (MANUAL ENTRY)
Bilirubin, UA: NEGATIVE
Blood, UA: NEGATIVE
Glucose, UA: NEGATIVE mg/dL
Ketones, POC UA: NEGATIVE mg/dL
Leukocytes, UA: NEGATIVE
Nitrite, UA: NEGATIVE
Protein Ur, POC: NEGATIVE mg/dL
Spec Grav, UA: 1.025 (ref 1.010–1.025)
Urobilinogen, UA: 0.2 E.U./dL
pH, UA: 5.5 (ref 5.0–8.0)

## 2022-05-21 MED ORDER — LANCETS MISC: 12 refills | Status: AC

## 2022-05-21 MED ORDER — TOUJEO SOLOSTAR 300 UNIT/ML ~~LOC~~ SOPN: 40.0000 [IU] | PEN_INJECTOR | Freq: Every day | SUBCUTANEOUS | 11 refills | Status: DC

## 2022-05-21 MED ORDER — ATORVASTATIN CALCIUM 40 MG PO TABS: ORAL_TABLET | ORAL | 3 refills | Status: DC

## 2022-05-21 MED ORDER — CONTOUR NEXT TEST VI STRP: ORAL_STRIP | 12 refills | Status: DC

## 2022-05-21 MED ORDER — FLUCONAZOLE 150 MG PO TABS
ORAL_TABLET | ORAL | 2 refills | Status: AC
Start: 2022-05-21 — End: ?

## 2022-05-21 NOTE — Telephone Encounter (Signed)
Patient calls nurse line requesting same day appointment for diabetes follow up, fatigue and burning with urination. Denies fever.   She states that blood sugar levels have been running 300-400's for the last several weeks. She noticed increased levels and fatigue after stopping Ozempic.   She still takes Toujeo 40 units daily.   She has not checked her blood sugar levels today. Scheduled patient in triage ATC slot this afternoon for further evaluation.   Veronda Prude, RN

## 2022-05-21 NOTE — Patient Instructions (Signed)
It was wonderful to meet you today. Thank you for allowing me to be a part of your care. Below is a short summary of what we discussed at your visit today:  Diabetes Keep taking your metformin as prescribed. Keep taking your atorvastatin for cholesterol as prescribed.  We are going to increase how much insulin you take each day. Measure your morning fasting blood sugar before you eat or drink anything.  If it is above 200, I want you to increase your insulin dose for the day by 2 units. For example, tomorrow morning, if your fasting sugar is above 200, I want you to increase your insulin to 42 units for that day. Keep checking every morning and adjusting your insulin up as needed.  Every day your blood sugar is above 200, add 2 units for that day to what ever # units you took the day before. Keep a log of every day, what your blood sugars are and how much insulin you gave.  Come back for recheck with Dr. McDiarmid as scheduled. Bring your blood sugar log.   We got blood and urine tests today. If the results are normal, I will send you a letter or MyChart message. If the results are abnormal, I will give you a call.      Please bring all of your medications to every appointment!  If you have any questions or concerns, please do not hesitate to contact us via phone or MyChart message.   Fayette Pho, MD

## 2022-05-21 NOTE — Assessment & Plan Note (Signed)
As we are correcting blood today, will collect repeat CBC.  Last obtained May 2023.

## 2022-05-21 NOTE — Assessment & Plan Note (Addendum)
Current medications metformin 5 mg twice daily and insulin Toujeo 40 units daily.  Unable to take atorvastatin, Jardiance, and Ozempic at this time (atorvastatin due to new pharmacy needing new prescription, Jardiance and Ozempic due to cost).  A1c elevated, out-of-control at 10.8 today.  - UA and CMP to rule out DKA or HHS (although unlikely given physical exam and HPI) - Continue metformin 500 mg twice daily - Restart atorvastatin 40 mg 2-3 times weekly - Continue insulin Toujeo 40 units, titrate up by 2 units each day fasting a.m. glucose >200 - Follow-up with Dr. McDiarmid scheduled for 1.5 weeks from now - Plan to restart Jardiance and Ozempic when able to afford / meet her deductible - Urine sample today for urine microalbumin - Refilled test trips and lancets

## 2022-05-21 NOTE — Progress Notes (Signed)
SUBJECTIVE:   CHIEF COMPLAINT / HPI:   T2DM Ozempic expensive in January due to new insurance (now [employer]). Since being off, now with increased appetite and other symptoms: - SOB - weak - glucose in 500s - blurred vision - daytime somnolence  During virtual appointment 04/16/22, Dr. Raymondo Band increased basal insulin from 38 to 40 units.   Current medications:  - metformin 500 mg BID - insulin Toujeo 40 units daily - atorvastatin 40 mg (not taking, needs new script since moving over to [employer]) - jardiance 10 mg (not taking due to cost) - Ozempic 2 mg weekly (not taking due to cost)  Home glucometer readings in the last couple of weeks range 300 - 500 usually. Does not keep a log. Brings her glucometer today, but cannot access prior readings on the machine.   Has not yet met her deductible, so cannot yet afford Jardiance or Ozempic.   Lab Results  Component Value Date   HGBA1C 10.8 (A) 05/21/2022   HGBA1C 7.0 11/30/2021   HGBA1C 8.8 (A) 08/24/2021   Lab Results  Component Value Date   LDLCALC 139 (H) 10/16/2018   CREATININE 1.54 (H) 12/07/2021   PERTINENT  PMH / PSH:  Patient Active Problem List   Diagnosis Date Noted   MPNST (malignant peripheral nerve sheath tumor) 04/03/2021    Priority: High   Anemia of chronic kidney failure, stage 3 (moderate) 02/09/2021    Priority: High   Insulin long-term use 11/25/2020    Priority: High   Body mass index (BMI) of 34.0-34.9 in adult 11/25/2020    Priority: High   Chronic kidney disease, stage 3a 02/26/2020    Priority: High   Diabetic macular edema of both eyes 11/20/2018    Priority: High   Diabetic visual loss: severe vision impairment of both eyes, with macular edema, with severe nonproliferative retinopathy, associated with type 2 diabetes mellitus 11/20/2018    Priority: High   Diabetic neuropathy 10/17/2018    Priority: High   Morbid obesity (HCC) 05/01/2013    Priority: High   Type 2 diabetes  mellitus with diabetic neuropathy, unspecified 10/18/2009    Priority: High   Mixed hyperlipidemia due to type 2 diabetes mellitus 12/01/2008    Priority: High   Hypertension associated with diabetes 04/04/2006    Priority: High   Arm DVT (deep venous thromboembolism), chronic, right 12/23/2020    Priority: Medium    Deep vein thrombosis (DVT) of brachial vein of right upper extremity 11/29/2020    Priority: Medium    GERD with esophagitis 01/20/2020    Priority: Medium    Obstructive sleep apnea 11/27/2019    Priority: Medium    Hypersomnia with sleep apnea 10/06/2019    Priority: Medium    Thrombocytosis 08/18/2019    Priority: Medium    NAFL (nonalcoholic fatty liver) 08/18/2019    Priority: Medium    Hypertensive retinopathy of both eyes 11/20/2018    Priority: Medium    Mixed incontinence 10/17/2018    Priority: Medium    Acne vulgaris 10/01/2017    Priority: Medium    Chronic constipation 02/21/2015    Priority: Medium    Iron deficiency anemia 04/04/2006    Priority: Medium    Perimenopausal vasomotor symptoms 09/03/2019    Priority: Low   Daytime somnolence 08/18/2019    Priority: Low   Visual impairment of left eye 08/18/2019    Priority: Low   History of glaucoma     Priority: Low  Bilateral senile cataracts 11/20/2018    Priority: Low   Hyperpigmentation 10/17/2018    Priority: Low   Macular degeneration 03/17/2012    Priority: Low   Trigger point with back pain 12/01/2021   Yeast vaginitis 10/06/2021    OBJECTIVE:   BP 116/71   Pulse 91   Ht  (1.753 m)   Wt 237 lb 6 oz (107.7 kg)   SpO2 99%   BMI 35.05 kg/m    Gen: Awake, alert, NAD HEENT: Sclera anicteric, moist oral mucosa Cardiac: Regular rate and rhythm, no murmur, brisk cap refill, normal skin turgor Respiratory: CTAB  ASSESSMENT/PLAN:   Type 2 diabetes mellitus with diabetic neuropathy, unspecified (HCC) Current medications metformin 5 mg twice daily and insulin Toujeo 40 units  daily.  Unable to take atorvastatin, Jardiance, and Ozempic at this time (atorvastatin due to new pharmacy needing new prescription, Jardiance and Ozempic due to cost).  A1c elevated, out-of-control at 10.8 today.  - UA and CMP to rule out DKA or HHS (although unlikely given physical exam and HPI) - Continue metformin 500 mg twice daily - Restart atorvastatin 40 mg 2-3 times weekly - Continue insulin Toujeo 40 units, titrate up by 2 units each day fasting a.m. glucose >200 - Follow-up with Dr. McDiarmid scheduled for 1.5 weeks from now - Plan to restart Jardiance and Ozempic when able to afford / meet her deductible - Urine sample today for urine microalbumin - Refilled test trips and lancets  Iron deficiency anemia As we are correcting blood today, will collect repeat CBC.  Last obtained May 2023.     Fayette Pho, MD [facility]

## 2022-05-21 NOTE — Telephone Encounter (Signed)
See note by Dr Raymondo Band on 04/16/22. If patient has meet her $1500 deductible , she likely may be able to restart her Ozempic and Jardiance.  Her A1c was 7.0% when she was taking these medications in addition to her Toujeo and metformin.

## 2022-05-22 ENCOUNTER — Other Ambulatory Visit: Payer: Self-pay

## 2022-05-22 ENCOUNTER — Other Ambulatory Visit: Payer: Self-pay | Admitting: Family Medicine

## 2022-05-22 LAB — COMPREHENSIVE METABOLIC PANEL
ALT: 19 IU/L (ref 0–32)
AST: 18 IU/L (ref 0–40)
Albumin/Globulin Ratio: 1.2 (ref 1.2–2.2)
Albumin: 4.2 g/dL (ref 3.8–4.9)
Alkaline Phosphatase: 123 IU/L — ABNORMAL HIGH (ref 44–121)
BUN/Creatinine Ratio: 19 (ref 9–23)
BUN: 27 mg/dL — ABNORMAL HIGH (ref 6–24)
Bilirubin Total: 0.4 mg/dL (ref 0.0–1.2)
CO2: 21 mmol/L (ref 20–29)
Calcium: 10.4 mg/dL — ABNORMAL HIGH (ref 8.7–10.2)
Chloride: 99 mmol/L (ref 96–106)
Creatinine, Ser: 1.43 mg/dL — ABNORMAL HIGH (ref 0.57–1.00)
Globulin, Total: 3.4 g/dL (ref 1.5–4.5)
Glucose: 220 mg/dL — ABNORMAL HIGH (ref 70–99)
Potassium: 4.3 mmol/L (ref 3.5–5.2)
Sodium: 136 mmol/L (ref 134–144)
Total Protein: 7.6 g/dL (ref 6.0–8.5)
eGFR: 44 mL/min/{1.73_m2} — ABNORMAL LOW (ref >59)

## 2022-05-22 LAB — CBC
Hematocrit: 33.5 % — ABNORMAL LOW (ref 34.0–46.6)
Hemoglobin: 10.6 g/dL — ABNORMAL LOW (ref 11.1–15.9)
MCH: 26.2 pg — ABNORMAL LOW (ref 26.6–33.0)
MCHC: 31.6 g/dL (ref 31.5–35.7)
MCV: 83 fL (ref 79–97)
Platelets: 362 10*3/uL (ref 150–450)
RBC: 4.05 x10E6/uL (ref 3.77–5.28)
RDW: 15.8 % — ABNORMAL HIGH (ref 11.7–15.4)
WBC: 7 10*3/uL (ref 3.4–10.8)

## 2022-05-22 NOTE — Telephone Encounter (Signed)
Contacted patient to assess glycemic control and medication access.   She reports continued elevated readings.   I shared that I have some  Ozempic (semaglutide) pen samples.   We discussed starting at half dose for ~ 2 weeks then increase to  weekly.    Medication Samples have been provided to the patient.  Drug name: Ozempic (semaglutide)              Qty: 2 pens  LOT: [id number]  Exp.Date: 11/04/2024  The patient has been instructed regarding the correct time, dose, and frequency of taking this medication, including desired effects and most common side effects.   Madelon Lips 3:28 PM 05/22/2022   Patient plans to pick-up - tomorrow Wednesday 4/17 on the AM

## 2022-05-22 NOTE — Addendum Note (Signed)
Addended by: Valetta Close on: 05/22/2022 05:38 PM   Modules accepted: Orders

## 2022-05-23 ENCOUNTER — Telehealth: Payer: Self-pay | Admitting: Family Medicine

## 2022-05-23 DIAGNOSIS — D509 Iron deficiency anemia, unspecified: Secondary | ICD-10-CM

## 2022-05-23 DIAGNOSIS — D75839 Thrombocytosis, unspecified: Secondary | ICD-10-CM

## 2022-05-23 LAB — MICROALBUMIN / CREATININE URINE RATIO
Creatinine, Urine: 241.9 mg/dL
Microalb/Creat Ratio: 10 mg/g creat (ref 0–29)
Microalbumin, Urine: 23.2 ug/mL

## 2022-05-23 MED ORDER — COMPLETENATE 29-1 MG PO CHEW: 1.0000 | CHEWABLE_TABLET | Freq: Every day | ORAL | 3 refills | Status: AC

## 2022-05-23 NOTE — Telephone Encounter (Signed)
Patient presents to clinic for samples. Provided with samples per note from Dr. Koval.   Lettie Czarnecki C Orine Goga, RN  

## 2022-05-23 NOTE — Telephone Encounter (Signed)
Called patient to discuss labs.   Labs for elevated glucose, uncontrolled diabetes: Calculated AG of 16, albumin corrected to 15.5. UA negative for all. Cr stable, alk phos nearly normal.   Anemia stable, known IDA. Patient reports she has been out of her prenatal chews with iron, needs to call it in to the pharmacy. I offered to send in a higher dose iron pill, but she politely declines. Will send in to preferred pharmacy.   She also expresses thanks to Dr. Raymondo Band for getting her an Ozempic sample. She started it today. Discussed how to adjust insulin going forward.    Would want to re-measure CMP once glucose under better control, hopefully with Dr. Perley Jain on 4/25.    Fayette Pho, MD

## 2022-05-23 NOTE — Telephone Encounter (Signed)
Reviewed and agree with Dr Koval's plan.   

## 2022-05-24 ENCOUNTER — Other Ambulatory Visit: Payer: Self-pay | Admitting: Gastroenterology

## 2022-05-24 LAB — CALCIUM, IONIZED

## 2022-05-24 LAB — SPECIMEN STATUS REPORT

## 2022-05-28 ENCOUNTER — Telehealth: Payer: Self-pay

## 2022-05-28 NOTE — Telephone Encounter (Signed)
PA request received via CMM for Linzess capsules  PA has been submitted to [facility] and is pending additional questions/determination  Key: [employer]

## 2022-05-28 NOTE — Telephone Encounter (Signed)
Patient not seen in over a year, PA has been cancelled.

## 2022-05-29 ENCOUNTER — Encounter: Payer: Self-pay | Admitting: Physician Assistant

## 2022-05-31 ENCOUNTER — Ambulatory Visit (INDEPENDENT_AMBULATORY_CARE_PROVIDER_SITE_OTHER): Payer: BLUE CROSS/BLUE SHIELD | Admitting: Family Medicine

## 2022-05-31 ENCOUNTER — Encounter: Payer: Self-pay | Admitting: Family Medicine

## 2022-05-31 VITALS — BP 138/88 | HR 87 | Ht 69.0 in | Wt 243.4 lb

## 2022-05-31 DIAGNOSIS — E1159 Type 2 diabetes mellitus with other circulatory complications: Secondary | ICD-10-CM

## 2022-05-31 DIAGNOSIS — D509 Iron deficiency anemia, unspecified: Secondary | ICD-10-CM | POA: Diagnosis not present

## 2022-05-31 DIAGNOSIS — E1140 Type 2 diabetes mellitus with diabetic neuropathy, unspecified: Secondary | ICD-10-CM

## 2022-05-31 DIAGNOSIS — Z794 Long term (current) use of insulin: Secondary | ICD-10-CM | POA: Diagnosis not present

## 2022-05-31 DIAGNOSIS — I152 Hypertension secondary to endocrine disorders: Secondary | ICD-10-CM

## 2022-05-31 DIAGNOSIS — N921 Excessive and frequent menstruation with irregular cycle: Secondary | ICD-10-CM

## 2022-05-31 DIAGNOSIS — L700 Acne vulgaris: Secondary | ICD-10-CM

## 2022-05-31 DIAGNOSIS — Z79899 Other long term (current) drug therapy: Secondary | ICD-10-CM

## 2022-05-31 DIAGNOSIS — C479 Malignant neoplasm of peripheral nerves and autonomic nervous system, unspecified: Secondary | ICD-10-CM

## 2022-05-31 DIAGNOSIS — L819 Disorder of pigmentation, unspecified: Secondary | ICD-10-CM

## 2022-05-31 DIAGNOSIS — I82721 Chronic embolism and thrombosis of deep veins of right upper extremity: Secondary | ICD-10-CM

## 2022-05-31 MED ORDER — TRETINOIN 0.025 % EX CREA
TOPICAL_CREAM | CUTANEOUS | 99 refills | Status: AC
Start: 2022-05-31 — End: ?

## 2022-05-31 MED ORDER — CLINDAMYCIN PHOS-BENZOYL PEROX 1.2-5 % EX GEL: CUTANEOUS | 99 refills | Status: DC

## 2022-05-31 MED ORDER — APIXABAN 2.5 MG PO TABS
2.5000 mg | ORAL_TABLET | Freq: Two times a day (BID) | ORAL | 5 refills | Status: DC
Start: 2022-05-31 — End: 2023-01-31

## 2022-05-31 NOTE — Patient Instructions (Signed)
After Visit Summary  We are cutting your Eliquis dose in half  Glargine can be decreased as blood sugar decreases  Making a OBGYN referral for hysterectomy consult.

## 2022-05-31 NOTE — Progress Notes (Signed)
      SUBJECTIVE:   CHIEF COMPLAINT / HPI:   Alicia West is a 52 yo female presenting for follow up on diabetes. Her other concerns today include heavy menstrual bleeding.  Type II Diabetes Two weeks ago her sugars were running in the 500s. She has since started back on Ozempic after being off of it since January. She also increased her glargine to 48 units. Now her morning blood sugars are around 170-200.  Heavy Menstrual Bleeding Patient has experienced heavy menstrual bleeding for many years but believes it is worse now that she is on eliquis. She has to use 3 pads at one time when her bleeding is the worst and it is causing her stress having to worry about her bleeding at work. She is wondering if she can stop Eliquis for a few days when her periods start and is also considering a hysterectomy.  PERTINENT  PMH / PSH: HTN, DM2, Heavy menstrual bleeding, acne vulgaris  OBJECTIVE:   BP 138/88   Pulse 87   Ht  (1.753 m)   Wt 243 lb 6 oz (110.4 kg)   LMP 05/06/2022   SpO2 100%   BMI 35.94 kg/m    Gen: well appearing, in no acute distress HEENT: normocephalic, atraumatic CV: RRR, no MRG Pulm: normal WOB, clear to auscultation bilaterally Ab: normoactive bowel sounds,no tenderness  ASSESSMENT/PLAN:   Hypertension associated with diabetes (HCC) HTN is well controlled, continue current regimen. F/u in 6 weeks.  Type 2 diabetes mellitus with diabetic neuropathy, unspecified (HCC) Patient has been unable to take ozempic or jardiance due to financial concerns for several months. She was given samples of ozempic last week. Her blood sugars were in the 500s a few weeks ago but are now better controlled to 170-200 in the mornings after increasing glargine to 48 units. Titrate up ozempic and continue current insulin dose until blood glucose improves, then insulin dose can be decreased. Patient says she will see eye doctor soon. Follow up in 6 weeks.  Menorrhagia Patient is  experiencing heavy menstrual bleeding that has worsened since starting Eliquis. Decreasing Eliquis dose to 2.5mg  for DVT prophylaxis. This should improve heavy menstrual bleeding. Sent referral to [facility] for hysterectomy consult.  Acne vulgaris Well controlled. Sent refill of clindamycin-benzoyl peroxide and Retin-A.     Barrett Shell, Medical Student [facility]

## 2022-05-31 NOTE — Assessment & Plan Note (Signed)
Well controlled. Sent refill of clindamycin-benzoyl peroxide and Retin-A.

## 2022-05-31 NOTE — Assessment & Plan Note (Signed)
HTN is well controlled, continue current regimen. F/u in 6 weeks.

## 2022-05-31 NOTE — Assessment & Plan Note (Addendum)
Patient has been unable to take ozempic or jardiance due to financial concerns for several months. She was given samples of ozempic last week. Her blood sugars were in the 500s a few weeks ago but are now better controlled to 170-200 in the mornings after increasing glargine to 48 units. Titrate up ozempic and continue current insulin dose until blood glucose improves, then insulin dose can be decreased. Patient says she will see eye doctor soon. Follow up in 6 weeks.

## 2022-05-31 NOTE — Assessment & Plan Note (Signed)
Patient is experiencing heavy menstrual bleeding that has worsened since starting Eliquis. Decreasing Eliquis dose to 2.5mg  for DVT prophylaxis. This should improve heavy menstrual bleeding. Sent referral to [facility] for hysterectomy consult.

## 2022-06-01 ENCOUNTER — Encounter: Payer: Self-pay | Admitting: Family Medicine

## 2022-06-01 NOTE — Progress Notes (Signed)
I saw Ms Alicia West with [facility].  I agree with his assessment and plan documented in his office visit note.

## 2022-06-05 ENCOUNTER — Ambulatory Visit: Payer: BC Managed Care – PPO | Admitting: Internal Medicine

## 2022-07-06 ENCOUNTER — Other Ambulatory Visit: Payer: Self-pay | Admitting: Obstetrics and Gynecology

## 2022-07-06 DIAGNOSIS — N924 Excessive bleeding in the premenopausal period: Secondary | ICD-10-CM

## 2022-07-23 ENCOUNTER — Telehealth: Payer: Self-pay

## 2022-07-23 NOTE — Telephone Encounter (Signed)
Patient LVM on nurse line regarding Ozempic. She states that she has still not met deductible and is asking for an additional pen. Returned call to patient.   She is due to inject her weekly dosage today or tomorrow.   Will forward to Dr. Raymondo Band for further assistance.   She is also asking that provider send Ozempic pen to the pharmacy, so that it is available for next month.   Veronda Prude, RN

## 2022-07-25 ENCOUNTER — Other Ambulatory Visit (HOSPITAL_COMMUNITY): Payer: Self-pay

## 2022-08-01 NOTE — Telephone Encounter (Signed)
Contacted patient and shared that we have 2mg  samples pens.   Medication Samples have been provided for the patient to pick up.  Drug name: Ozempic (semaglutide)       Strength: 2mg         Qty: 2 pens  LOT: [id number]  Exp.Date: 07/05/2024  Dosing instructions: 2mg  weekly  The patient has been instructed regarding the correct time, dose, and frequency of taking this medication, including desired effects and most common side effects.   Alicia West 10:00 AM 08/01/2022  Patient plans to pick up this AM - medication placed at front desk for pick-up

## 2022-08-01 NOTE — Telephone Encounter (Signed)
Reviewed and agree with Dr Koval's plan.   

## 2022-08-06 ENCOUNTER — Other Ambulatory Visit: Payer: BLUE CROSS/BLUE SHIELD

## 2022-08-22 ENCOUNTER — Ambulatory Visit: Admission: RE | Admit: 2022-08-22 | Payer: BLUE CROSS/BLUE SHIELD | Source: Ambulatory Visit

## 2022-08-22 DIAGNOSIS — N924 Excessive bleeding in the premenopausal period: Secondary | ICD-10-CM

## 2022-10-03 ENCOUNTER — Other Ambulatory Visit: Payer: Self-pay | Admitting: Family Medicine

## 2022-10-03 DIAGNOSIS — Z794 Long term (current) use of insulin: Secondary | ICD-10-CM

## 2022-10-18 ENCOUNTER — Ambulatory Visit (INDEPENDENT_AMBULATORY_CARE_PROVIDER_SITE_OTHER): Payer: BLUE CROSS/BLUE SHIELD | Admitting: Family Medicine

## 2022-10-18 ENCOUNTER — Encounter: Payer: Self-pay | Admitting: Family Medicine

## 2022-10-18 VITALS — BP 130/85 | HR 86 | Ht 69.0 in | Wt 234.4 lb

## 2022-10-18 DIAGNOSIS — Z794 Long term (current) use of insulin: Secondary | ICD-10-CM

## 2022-10-18 DIAGNOSIS — M549 Dorsalgia, unspecified: Secondary | ICD-10-CM

## 2022-10-18 DIAGNOSIS — Z6834 Body mass index (BMI) 34.0-34.9, adult: Secondary | ICD-10-CM

## 2022-10-18 DIAGNOSIS — N921 Excessive and frequent menstruation with irregular cycle: Secondary | ICD-10-CM

## 2022-10-18 DIAGNOSIS — C479 Malignant neoplasm of peripheral nerves and autonomic nervous system, unspecified: Secondary | ICD-10-CM | POA: Diagnosis not present

## 2022-10-18 DIAGNOSIS — R2231 Localized swelling, mass and lump, right upper limb: Secondary | ICD-10-CM

## 2022-10-18 DIAGNOSIS — E11311 Type 2 diabetes mellitus with unspecified diabetic retinopathy with macular edema: Secondary | ICD-10-CM

## 2022-10-18 DIAGNOSIS — I152 Hypertension secondary to endocrine disorders: Secondary | ICD-10-CM

## 2022-10-18 DIAGNOSIS — E1140 Type 2 diabetes mellitus with diabetic neuropathy, unspecified: Secondary | ICD-10-CM | POA: Diagnosis not present

## 2022-10-18 DIAGNOSIS — E1159 Type 2 diabetes mellitus with other circulatory complications: Secondary | ICD-10-CM

## 2022-10-18 DIAGNOSIS — Z23 Encounter for immunization: Secondary | ICD-10-CM

## 2022-10-18 LAB — POCT URINALYSIS DIP (MANUAL ENTRY)
Bilirubin, UA: NEGATIVE
Blood, UA: NEGATIVE
Glucose, UA: NEGATIVE mg/dL
Ketones, POC UA: NEGATIVE mg/dL
Leukocytes, UA: NEGATIVE
Nitrite, UA: NEGATIVE
Protein Ur, POC: NEGATIVE mg/dL
Spec Grav, UA: 1.025 (ref 1.010–1.025)
Urobilinogen, UA: 0.2 U/dL
pH, UA: 6 (ref 5.0–8.0)

## 2022-10-18 LAB — POCT GLYCOSYLATED HEMOGLOBIN (HGB A1C): HbA1c, POC (controlled diabetic range): 7.1 % — AB (ref 0.0–7.0)

## 2022-10-18 NOTE — Patient Instructions (Signed)
Your A1c of 7.1% is down from 10.8% in April.  Great Job!! Keep taking your metformin, insulin and Ozempic like you are.  Your weight is down 10 pounds from April.   Remember to drink at least four 8 ounce glasses of fluids a day.  We need to see if the things your are feeling under your are are those nerve tumors.  A MRI of your armpit should be able to [place].   We are checking your kidneys, iron and hemoglobin today, along with urine testing for dehydration.   If there are important abnormalities of your test, Dr Keirra Zeimet will contact you by phone, if not, we will send you the results by MyChart.   A REferral to [facility] was sent to follow up your diabetic retinopathy.    Dr Gerda Yin would like to see you back in four weeks to follow up these tests and how you are doing.

## 2022-10-19 ENCOUNTER — Encounter: Payer: Self-pay | Admitting: Family Medicine

## 2022-10-19 DIAGNOSIS — R2231 Localized swelling, mass and lump, right upper limb: Secondary | ICD-10-CM | POA: Insufficient documentation

## 2022-10-19 LAB — CBC
Hematocrit: 34 % (ref 34.0–46.6)
Hemoglobin: 10.8 g/dL — ABNORMAL LOW (ref 11.1–15.9)
MCH: 26.8 pg (ref 26.6–33.0)
MCHC: 31.8 g/dL (ref 31.5–35.7)
MCV: 84 fL (ref 79–97)
Platelets: 439 10*3/uL (ref 150–450)
RBC: 4.03 x10E6/uL (ref 3.77–5.28)
RDW: 15.5 % — ABNORMAL HIGH (ref 11.7–15.4)
WBC: 4.9 10*3/uL (ref 3.4–10.8)

## 2022-10-19 LAB — RENAL FUNCTION PANEL
Albumin: 4 g/dL (ref 3.8–4.9)
BUN/Creatinine Ratio: 13 (ref 9–23)
BUN: 16 mg/dL (ref 6–24)
CO2: 23 mmol/L (ref 20–29)
Calcium: 10.1 mg/dL (ref 8.7–10.2)
Chloride: 104 mmol/L (ref 96–106)
Creatinine, Ser: 1.21 mg/dL — ABNORMAL HIGH (ref 0.57–1.00)
Glucose: 101 mg/dL — ABNORMAL HIGH (ref 70–99)
Phosphorus: 2.7 mg/dL — ABNORMAL LOW (ref 3.0–4.3)
Potassium: 4.1 mmol/L (ref 3.5–5.2)
Sodium: 139 mmol/L (ref 134–144)
eGFR: 54 mL/min/{1.73_m2} — ABNORMAL LOW (ref >59)

## 2022-10-19 LAB — FERRITIN: Ferritin: 23 ng/mL (ref 15–150)

## 2022-10-19 NOTE — Progress Notes (Signed)
Alicia West is alone Sources of clinical information for visit is/are patient. Nursing assessment for this office visit was reviewed with the patient for accuracy and revision.     Previous Report(s) Reviewed: Discussed with radiologist about best imaging method for right axillary masses.      08/13/2019    9:02 AM  Depression screen PHQ 2/9  Decreased Interest 0  Down, Depressed, Hopeless 0  PHQ - 2 Score 0   Flowsheet Row Office Visit from 07/29/2019 in [facility] Office Visit from 04/04/2016 in [facility] Office Visit from 01/25/2016 in [facility]  Thoughts that you would be better off dead, or of hurting yourself in some way Not at all Not at all Not at all  PHQ-9 Total Score 19 0 0          08/13/2019    9:02 AM 01/28/2019    8:43 AM 04/16/2018    3:05 PM 01/31/2018    8:36 AM 01/07/2018    3:04 PM  Fall Risk   Falls in the past year? 0 0 0 0 0  Number falls in past yr: 0 0 0  0  Injury with Fall?   0  0  Follow up Falls evaluation completed  Falls evaluation completed         08/13/2019    9:02 AM 07/29/2019    3:24 PM 01/28/2019    8:43 AM  PHQ9 SCORE ONLY  PHQ-9 Total Score 0 19 3    There are no preventive care reminders to display for this patient.  Health Maintenance Due  Topic Date Due   Zoster Vaccines- Shingrix (1 of 2) Never done   FOOT EXAM  02/24/2021   OPHTHALMOLOGY EXAM  08/23/2022      History/P.E. limitations: none  There are no preventive care reminders to display for this patient.  Diabetes Health Maintenance Due  Topic Date Due   FOOT EXAM  02/24/2021   OPHTHALMOLOGY EXAM  08/23/2022   HEMOGLOBIN A1C  04/17/2023    Health Maintenance Due  Topic Date Due   Zoster Vaccines- Shingrix (1 of 2) Never done   FOOT EXAM  02/24/2021   OPHTHALMOLOGY EXAM  08/23/2022     Chief Complaint  Patient presents with   Knots under arms      --------------------------------------------------------------------------------------------------------------------------------------------- Visit Problem List with A/P  No problem-specific Assessment & Plan notes found for this encounter.

## 2022-10-19 NOTE — Assessment & Plan Note (Signed)
Established problem Lab Results  Component Value Date   HGBA1C 7.1 (A) 10/18/2022  Improved from 10.8% in May.  Ms Halker was able to get prescribed affordable semaglutide in last couple days.  Well Controlled. Patient is at goal of A1c less than 7.5%. No signs of complications, medication side effects, or red flags. Continue semaglutide 2 mg Mono q wk and Metformin 500 mg twice a day and Tresiba 48 units [place] weekly.   Consider insulin dose reduction in future to help with weight loss as

## 2022-10-19 NOTE — Assessment & Plan Note (Signed)
Established problem. Adequate blood pressure control.  No evidence of new end organ damage.  Tolerating medication without significant adverse effects.  Plan to continue current blood pressure medication regiment.   

## 2022-10-19 NOTE — Assessment & Plan Note (Signed)
Established problem that has improved and has meet goal of declining weight.  Patient eating mostly vegetables and fruits. She has felt lightheaded at times, she suspect she is not drinking enough fluids as she does not have a strong desire to eat or drink on semaglutide. Urine Sp Grav 1.025 c/w a degree of decreased hydration Continue current medication regiment. Encoraged conscious drinking of four 8-ounce glasses of fluid daily, at least.

## 2022-10-19 NOTE — Assessment & Plan Note (Signed)
New issue Alicia West feeling fullness and discomfort in her right axilla, particularly when she abducts her right arm. This has arisen in the last month.  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  Mildly tender to deep palpation.   A/ I am concerned that this could be progression of her MPNST process.  It could be HS but without the inflammatory findings of usual HS, I an hesitant to hang my hat on it.   I discussed best imaging to assess for possible MPNST in the axilla with radiologist. They recommeded an brachial lexus MRI with and without contrst.  P/ Order for same was sent.

## 2022-10-19 NOTE — Assessment & Plan Note (Signed)
Alicia West has not had a period for several months, so her HMB has not been and issue.  She saw Dr Wonda [facility]) who did not think further work up of her fibroids or endometrial stripe were needed.   Will check CBC and ferritin No further work up planned other than these labs

## 2022-10-25 ENCOUNTER — Ambulatory Visit (HOSPITAL_COMMUNITY): Payer: BLUE CROSS/BLUE SHIELD

## 2022-10-29 ENCOUNTER — Other Ambulatory Visit: Payer: Self-pay | Admitting: Family Medicine

## 2022-10-29 DIAGNOSIS — C479 Malignant neoplasm of peripheral nerves and autonomic nervous system, unspecified: Secondary | ICD-10-CM

## 2022-10-29 DIAGNOSIS — R2231 Localized swelling, mass and lump, right upper limb: Secondary | ICD-10-CM

## 2022-10-29 NOTE — Progress Notes (Unsigned)
Order for axillary [place] because insurer not cover MRI without preceding [place].

## 2022-10-31 ENCOUNTER — Other Ambulatory Visit: Payer: Self-pay | Admitting: Family Medicine

## 2022-10-31 ENCOUNTER — Telehealth: Payer: Self-pay

## 2022-10-31 DIAGNOSIS — R2231 Localized swelling, mass and lump, right upper limb: Secondary | ICD-10-CM

## 2022-10-31 NOTE — Telephone Encounter (Signed)
Spoke with patient. Informed her of her mammogram and Ultra sound on Oct. 8th at 2:30. At the [facility]. Patient understood. Aquilla Solian, CMA

## 2022-10-31 NOTE — Progress Notes (Signed)
Order right axilla [place] for right axilla masses in patient with history malignant nerve sheath tumors of right arm.  Insurance refused MRI of Axilla

## 2022-11-13 ENCOUNTER — Ambulatory Visit
Admission: RE | Admit: 2022-11-13 | Discharge: 2022-11-13 | Disposition: A | Discharge status: Home / Self Care | Payer: BLUE CROSS/BLUE SHIELD | Source: Ambulatory Visit | Attending: Family Medicine | Admitting: Family Medicine

## 2022-11-13 DIAGNOSIS — R2231 Localized swelling, mass and lump, right upper limb: Secondary | ICD-10-CM

## 2022-11-23 ENCOUNTER — Other Ambulatory Visit: Payer: Self-pay | Admitting: Family Medicine

## 2022-11-23 DIAGNOSIS — I10 Essential (primary) hypertension: Secondary | ICD-10-CM

## 2022-11-29 LAB — HM DIABETES EYE EXAM

## 2022-12-07 ENCOUNTER — Encounter: Payer: Self-pay | Admitting: Family Medicine

## 2022-12-31 ENCOUNTER — Other Ambulatory Visit: Payer: Self-pay

## 2022-12-31 DIAGNOSIS — E1165 Type 2 diabetes mellitus with hyperglycemia: Secondary | ICD-10-CM

## 2022-12-31 DIAGNOSIS — E1169 Type 2 diabetes mellitus with other specified complication: Secondary | ICD-10-CM

## 2022-12-31 MED ORDER — METFORMIN HCL 500 MG PO TABS: ORAL_TABLET | ORAL | 3 refills | Status: DC

## 2023-01-15 ENCOUNTER — Other Ambulatory Visit: Payer: Self-pay | Admitting: Family Medicine

## 2023-01-15 DIAGNOSIS — I10 Essential (primary) hypertension: Secondary | ICD-10-CM

## 2023-01-31 ENCOUNTER — Encounter: Payer: Self-pay | Admitting: Family Medicine

## 2023-01-31 ENCOUNTER — Other Ambulatory Visit: Payer: Self-pay | Admitting: Family Medicine

## 2023-01-31 DIAGNOSIS — I82721 Chronic embolism and thrombosis of deep veins of right upper extremity: Secondary | ICD-10-CM

## 2023-01-31 DIAGNOSIS — C479 Malignant neoplasm of peripheral nerves and autonomic nervous system, unspecified: Secondary | ICD-10-CM

## 2023-01-31 NOTE — Telephone Encounter (Signed)
Plan to continue Eliquis until MPNST resolves.

## 2023-02-18 ENCOUNTER — Encounter: Payer: Self-pay | Admitting: Physician Assistant

## 2023-02-18 ENCOUNTER — Other Ambulatory Visit (HOSPITAL_COMMUNITY): Payer: Self-pay

## 2023-02-26 ENCOUNTER — Encounter: Payer: Self-pay | Admitting: Family Medicine

## 2023-02-26 DIAGNOSIS — Z973 Presence of spectacles and contact lenses: Secondary | ICD-10-CM | POA: Insufficient documentation

## 2023-02-26 HISTORY — DX: Presence of spectacles and contact lenses: Z97.3

## 2023-04-08 ENCOUNTER — Encounter: Payer: Self-pay | Admitting: Physician Assistant

## 2023-04-09 ENCOUNTER — Ambulatory Visit (INDEPENDENT_AMBULATORY_CARE_PROVIDER_SITE_OTHER): Admitting: Student

## 2023-04-09 VITALS — BP 115/80 | HR 98 | Temp 98.4°F | Wt 239.6 lb

## 2023-04-09 DIAGNOSIS — R197 Diarrhea, unspecified: Secondary | ICD-10-CM | POA: Diagnosis not present

## 2023-04-09 DIAGNOSIS — M65341 Trigger finger, right ring finger: Secondary | ICD-10-CM | POA: Diagnosis not present

## 2023-04-09 NOTE — Patient Instructions (Addendum)
 It was great to see you today! Thank you for choosing [facility] for your primary care.  Today we addressed: Diarrhea: Lets get blood testing and stool testing.  If these are all unremarkable, we should consider sending you to GI for repeat colonoscopy. Trigger finger: I placed a referral for you.  If you haven't already, sign up for My Chart to have easy access to your labs results, and communication with your primary care physician. We are checking some labs today. If they are abnormal, I will call you. If they are normal, I will send you a MyChart message (if it is active) or a letter in the mail. If you do not hear about your labs in the next 2 weeks, please call the office. Return if symptoms worsen or fail to improve. Please arrive 15 minutes before your appointment to ensure smooth check in process.  We appreciate your efforts in making this happen.  Thank you for allowing me to participate in your care, Shelby Mattocks, DO 04/09/2023, 2:38 PM PGY-3, [facility]

## 2023-04-09 NOTE — Progress Notes (Unsigned)
  SUBJECTIVE:   CHIEF COMPLAINT / HPI:   Pain in right 4th finger since early January.  She states it gets stuck in a flexed position and she will have to pry it open using her arm or hand.  It is greatly uncomfortable and she has not experienced this prior.  She believes it is trigger finger.  Diarrhea: 1.5 weeks, she is on metformin 500mg  BID, ozempic 2mg  weekly. Denies recent antibiotic use. She is having 8-10 episodes per day. Very little abdominal pain. She hears her stomach rumbling loudly. States she is having more burps which her husband says smells. Endorses nausea infrequently, no vomiting. Denies blood in stool. Denies medication changes in the last month.   PERTINENT  PMH / PSH: HTN, T2DM, HLD  OBJECTIVE:  BP 115/80   Pulse 98   Temp 98.4 F (36.9 C)   Wt 239 lb 9.6 oz (108.7 kg)   SpO2 96%   BMI 35.38 kg/m  General: Well-appearing, NAD Abdomen: Soft, nontender, normoactive bowel sounds MSK: No appreciable deformity, tenderness over flexor tendon of right fourth finger without appreciable nodule  ASSESSMENT/PLAN:   Assessment & Plan Diarrhea, unspecified type No red flag symptoms with the exception of diarrhea greater than 1 week, the number of episodes is concerning as well.  I do not suspect this is related to her metformin or Ozempic given timeline and prior normal toleration of her current medications. History not consistent with infectious pathology. She may ultimately need colonoscopy although it is not due until 2026 per review of last colonoscopy.  Will obtain GI pathogen panel, CBC, BMP, TSH.   Trigger ring finger of right hand Discussed steroid injection versus surgery.  Patient opted for steroid injection, placed referral to sports medicine. Return if symptoms worsen or fail to improve. Shelby Mattocks, DO 04/10/2023, 8:41 AM PGY-3, [facility]

## 2023-04-10 ENCOUNTER — Encounter: Payer: Self-pay | Admitting: Student

## 2023-04-10 LAB — BASIC METABOLIC PANEL
BUN/Creatinine Ratio: 13 (ref 9–23)
BUN: 16 mg/dL (ref 6–24)
CO2: 25 mmol/L (ref 20–29)
Calcium: 10.2 mg/dL (ref 8.7–10.2)
Chloride: 105 mmol/L (ref 96–106)
Creatinine, Ser: 1.28 mg/dL — ABNORMAL HIGH (ref 0.57–1.00)
Glucose: 127 mg/dL — ABNORMAL HIGH (ref 70–99)
Potassium: 4 mmol/L (ref 3.5–5.2)
Sodium: 141 mmol/L (ref 134–144)
eGFR: 50 mL/min/{1.73_m2} — ABNORMAL LOW (ref >59)

## 2023-04-10 LAB — CBC
Hematocrit: 33.7 % — ABNORMAL LOW (ref 34.0–46.6)
Hemoglobin: 10.7 g/dL — ABNORMAL LOW (ref 11.1–15.9)
MCH: 26.8 pg (ref 26.6–33.0)
MCHC: 31.8 g/dL (ref 31.5–35.7)
MCV: 85 fL (ref 79–97)
Platelets: 380 10*3/uL (ref 150–450)
RBC: 3.99 x10E6/uL (ref 3.77–5.28)
RDW: 15.4 % (ref 11.7–15.4)
WBC: 6.1 10*3/uL (ref 3.4–10.8)

## 2023-04-10 LAB — TSH: TSH: 2.62 u[IU]/mL (ref 0.450–4.500)

## 2023-04-19 ENCOUNTER — Encounter: Payer: Self-pay | Admitting: Family Medicine

## 2023-04-19 ENCOUNTER — Ambulatory Visit (INDEPENDENT_AMBULATORY_CARE_PROVIDER_SITE_OTHER): Admitting: Family Medicine

## 2023-04-19 VITALS — BP 110/66 | Ht 69.0 in | Wt 238.0 lb

## 2023-04-19 DIAGNOSIS — M6530 Trigger finger, unspecified finger: Secondary | ICD-10-CM | POA: Diagnosis not present

## 2023-04-19 MED ORDER — METHYLPREDNISOLONE ACETATE 40 MG/ML IJ SUSP
20.0000 mg | Freq: Once | INTRAMUSCULAR | Status: AC
  Administered 2023-04-19: 20 mg via INTRA_ARTICULAR

## 2023-04-19 NOTE — Patient Instructions (Signed)
 Today you received an injection with a corticosteroid. This injection is usually done in response to pain and inflammation. There is some "numbing medicine" (Lidocaine) in the shot, so the injected area may be numb and feel really good for the next couple of hours. The numbing medicine usually wears off in 2-3 hours, and then your pain level may be back to where it was before the injection until the steroid starts working.    You may actually experience a small (as in 10%) INCREASE in pressure sensation in the first 24 hours---that is common.  Things to watch out for that you should contact us or a health care provider urgently would include: 1. Unusual (as in more than 10%) increase in pain 2. New fever > 101.5 3. New swelling or redness of the injected area. 4. Streaking of red lines around the area injected.  Do not hesitate to call or reach out with any questions or concerns.

## 2023-04-19 NOTE — Assessment & Plan Note (Signed)
-   The patient's history and exam are consistent with right ring finger trigger finger with tenderness over a nodularity in the flexor tendon that has a palpable catch on of the A1 pulley. - We discussed the options for treatment including conservative, observation, and after joint decision-making patient would like to proceed with a trigger finger injection. - Injection as below.  Hopefully this will give the patient relief.  We will follow-up in 3 weeks and if not improved then we can repeat the injection.  Procedure: After informed written consent, timeout was performed.  Area overlying A1 pulley of the right fourth digit digit prepped with alcohol swabs then utilizing ultrasound guidance, patient's fourth trigger finger injected with 1/2:1/2 lidocaine 1% w/out [id number] at the A1 pulley. Pt tolerated well. No complications and good pain relief.    We discussed options for limiting range of motion and avoiding repetitive movements. She can use ice and anti inflammatories as necessary. Will follow-up as needed

## 2023-04-19 NOTE — Progress Notes (Signed)
  Alicia West - 53 y.o. female MRN [id number]  Date of birth: 1970/03/20  PCP: McDiarmid, Leighton Roach, MD  Subjective:  No chief complaint on file. Pain in the right fourth finger  HPI: Past Medical, Surgical, Social, and Family History Reviewed & Updated per EMR.   Patient is a 53 y.o. female here for pain and catching of her right fourth finger that has been going on for several weeks.  It started slowly but is progressed to the point of getting stuck in flexion and needing to pop it loose with her left hand.  There has been no overlying warmth, redness, streaking, numbness or weakness.  She denies any previous trauma or history of the symptoms.  Past Surgical History:  Procedure Laterality Date   PHOTOCOAGULATION WITH LASER Left    For diabetic retinopathy   REDUCTION MAMMAPLASTY     TONSILLECTOMY     TONSILLECTOMY     WISDOM TOOTH EXTRACTION      Allergies  Allergen Reactions   Ace Inhibitors Cough    Other reaction(s): Other (See Comments)   Amlodipine Swelling    edema Other reaction(s): Other (See Comments) edema   Lipitor [Atorvastatin] Other (See Comments)    Muscle aches in thighs   Penicillins Itching and Rash    Able to tolerate cephalosporins Other reaction(s): Other (See Comments) Able to tolerate cephalosporins   Hydrocodone-Acetaminophen Nausea And Vomiting   Percocet [Oxycodone-Acetaminophen] Nausea And Vomiting        Objective:  Physical Exam: VS: BP:110/66  HR: bpm  TEMP: ( )  RESP:   HT:5\' 9"  (175.3 cm)   WT:238 lb (108 kg)  BMI:35.13  Gen: Well developed, NAD, speaks clearly, comfortable in exam room Respiratory: Normal work of breathing on room air, no respiratory distress Skin: No rashes, abrasions, or ecchymosis MSK:  Inspection of the right hand reveals no obvious deformities, overlying ecchymosis, erythema, or soft tissue edema The fourth flexor tendon has a palpable nodule that is tender to palpation, and mobile with flexion and  extension A palpable catch is appreciated with flexion and extension and reproduces pain Full range of motion of all other digits Neuro: No sensory deficits   Assessment & Plan:   Trigger finger, acquired - The patient's history and exam are consistent with right ring finger trigger finger with tenderness over a nodularity in the flexor tendon that has a palpable catch on of the A1 pulley. - We discussed the options for treatment including conservative, observation, and after joint decision-making patient would like to proceed with a trigger finger injection. - Injection as below.  Hopefully this will give the patient relief.  We will follow-up in 3 weeks and if not improved then we can repeat the injection.  Procedure: After informed written consent, timeout was performed.  Area overlying A1 pulley of the right fourth digit digit prepped with alcohol swabs then utilizing ultrasound guidance, patient's fourth trigger finger injected with 1/2:1/2 lidocaine 1% w/out IEP:[id number] at the A1 pulley. Pt tolerated well. No complications and good pain relief.    We discussed options for limiting range of motion and avoiding repetitive movements. She can use ice and anti inflammatories as necessary. Will follow-up as needed    Rica Mote MD [facility] Fellow

## 2023-04-20 NOTE — Progress Notes (Signed)
 Attending Supervision of Procedure Note: I have seen and  examined the patient, reviewed their chart, discussed with the resident physician and agree with the assessment and plan including the in office planned procedure:  I supervised and was present for the key and critical portions of the procedure: trigger finger injection

## 2023-04-30 LAB — GI PROFILE, STOOL, PCR

## 2023-05-02 ENCOUNTER — Other Ambulatory Visit: Payer: Self-pay | Admitting: Student

## 2023-05-02 DIAGNOSIS — A0472 Enterocolitis due to Clostridium difficile, not specified as recurrent: Secondary | ICD-10-CM

## 2023-05-02 MED ORDER — FIDAXOMICIN 200 MG PO TABS: 200.0000 mg | ORAL_TABLET | Freq: Two times a day (BID) | ORAL | 0 refills | Status: AC

## 2023-05-27 ENCOUNTER — Other Ambulatory Visit: Payer: Self-pay | Admitting: Family Medicine

## 2023-05-27 DIAGNOSIS — E1140 Type 2 diabetes mellitus with diabetic neuropathy, unspecified: Secondary | ICD-10-CM

## 2023-06-09 ENCOUNTER — Other Ambulatory Visit: Payer: Self-pay | Admitting: Family Medicine

## 2023-06-09 DIAGNOSIS — I10 Essential (primary) hypertension: Secondary | ICD-10-CM

## 2023-06-12 ENCOUNTER — Telehealth: Payer: Self-pay | Admitting: Family Medicine

## 2023-06-12 ENCOUNTER — Encounter: Payer: Self-pay | Admitting: Family Medicine

## 2023-06-12 ENCOUNTER — Ambulatory Visit (INDEPENDENT_AMBULATORY_CARE_PROVIDER_SITE_OTHER): Admitting: Family Medicine

## 2023-06-12 VITALS — BP 96/71 | HR 89 | Ht 69.0 in | Wt 232.4 lb

## 2023-06-12 DIAGNOSIS — E1149 Type 2 diabetes mellitus with other diabetic neurological complication: Secondary | ICD-10-CM | POA: Diagnosis not present

## 2023-06-12 DIAGNOSIS — Z794 Long term (current) use of insulin: Secondary | ICD-10-CM

## 2023-06-12 DIAGNOSIS — E1159 Type 2 diabetes mellitus with other circulatory complications: Secondary | ICD-10-CM

## 2023-06-12 DIAGNOSIS — C479 Malignant neoplasm of peripheral nerves and autonomic nervous system, unspecified: Secondary | ICD-10-CM | POA: Diagnosis not present

## 2023-06-12 DIAGNOSIS — E1140 Type 2 diabetes mellitus with diabetic neuropathy, unspecified: Secondary | ICD-10-CM

## 2023-06-12 DIAGNOSIS — M545 Low back pain, unspecified: Secondary | ICD-10-CM | POA: Diagnosis not present

## 2023-06-12 DIAGNOSIS — I152 Hypertension secondary to endocrine disorders: Secondary | ICD-10-CM

## 2023-06-12 DIAGNOSIS — G8929 Other chronic pain: Secondary | ICD-10-CM

## 2023-06-12 LAB — POCT GLYCOSYLATED HEMOGLOBIN (HGB A1C): HbA1c, POC (controlled diabetic range): 9.4 % — AB (ref 0.0–7.0)

## 2023-06-12 MED ORDER — GABAPENTIN 300 MG PO CAPS
600.0000 mg | ORAL_CAPSULE | Freq: Every day | ORAL | 0 refills | Status: DC
Start: 2023-06-12 — End: 2023-06-20

## 2023-06-12 MED ORDER — SEMAGLUTIDE(0.25 OR 0.5MG/DOS) 2 MG/3ML ~~LOC~~ SOPN: 0.5000 mg | PEN_INJECTOR | SUBCUTANEOUS | 0 refills | Status: AC

## 2023-06-12 MED ORDER — SEMAGLUTIDE (1 MG/DOSE) 4 MG/3ML ~~LOC~~ SOPN: 1.0000 mg | PEN_INJECTOR | SUBCUTANEOUS | 0 refills | Status: AC

## 2023-06-12 MED ORDER — LIDOCAINE 5 % EX PTCH
1.0000 | MEDICATED_PATCH | CUTANEOUS | 0 refills | Status: AC
Start: 2023-06-12 — End: ?

## 2023-06-12 NOTE — Assessment & Plan Note (Signed)
 Update A1c and urine microalbumin/creatinine ratio today.  Previous A1c 7.1.  However, patient's sugars have been elevated when she was not on Ozempic .  She has started back on this medication due to obtaining finances now which is reassuring.  Will follow-up A1c and further medication titration as indicated.  Of note, SGLT2's have caused her yeast infections in the past.

## 2023-06-12 NOTE — Assessment & Plan Note (Signed)
 Persistent paresthesias and numbness.  Advised to follow back up with neurosurgery for further recommendations.  In the meantime, will increase gabapentin  300 mg nightly to 600 mg nightly.

## 2023-06-12 NOTE — Patient Instructions (Addendum)
 Follow up with neurosurgery for your sheath tumor and numbness/paresthesias:  Aurther Blue, MD  [address]  [place], [place] [address]  Phone: [phone]  Fax: [phone]   We obtained A1c and urine studies today for the diabetes. Continue to take your Ozempic . I will be in touch with further changes based on A1c.  We increased your gabapentin  today for the sheath tumor pain.  Come back in 1 month or sooner if needed.

## 2023-06-12 NOTE — Assessment & Plan Note (Addendum)
 BP soft today, previously well-controlled. With higher sugars, could be osmotic diuresis. I have attempted to call patient and her spouse to inform to take half of her losartan -hydrochlorothiazide  (to make 50-12.5 mg dosing) for 1 week and come back for BP recheck with PCP. Will continue trying to get in touch.

## 2023-06-12 NOTE — Telephone Encounter (Signed)
 Spoke with patient about A1c elevation to 9.4 from 7.1 prior. She is back on Ozempic . She has been titrating it to lower clicks since she knows that she should not go back to 2 mg at once. Will send in 0.5 mg weekly dose for next week and then 1 mg weekly dose for the week after that. After these two doses, can go back to 2 mg/week dosing.  Also discussed halving her Hyzaar tablet due to softer BP. She will do this this week and follow up next week on 5/15 for BP recheck with me.

## 2023-06-12 NOTE — Progress Notes (Addendum)
 SUBJECTIVE:   CHIEF COMPLAINT / HPI:   Back and side pain She has been having this for a while now. 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 Has been seen for this prior and felt due to strain. Now moved her personal bed with her husband recently which worsened the pain. She denies weakness in the legs but she does have pain in the anterior bilateral thighs. No difficulty ambulating. She does endorse loss of urine randomly and without meaning to. No bowel incontinence.  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 She has followed with [facility] in 2023 who said that they could not do surgery given the location and characteristics of the tumor.  She has not followed up with them since.  Has been continuing to have paresthesias, numbness, coldness in the right arm mainly (where the sheath is). She has been taking gabapentin  300 mg at bedtime without much help.  T2DM Sugars have been running in the 400-500s recently. Feels this is making her more forgetful and worsening her eye sight. She has been taking 500 mg BID due to GI effects. Also taking glargine 48u daily. Getting back on Ozempic  now but was off due to finances. Had yeast infections on jardiance .  PERTINENT  PMH / PSH: HTN, T2DM, DVT of RUE, OSA, NAFLD, neuropathy, malignant peripheral nerve sheath tumor, cataracts, glaucoma  OBJECTIVE:   BP 96/71   Pulse 89   Ht 5\' 9"  (1.753 m)   Wt 232 lb 6.4 oz (105.4 kg)   SpO2 99%   BMI 34.32 kg/m   General: Alert and oriented, in NAD Skin: Warm, dry, and intact HEENT: NCAT, EOM grossly normal, midline nasal septum Cardiac: RRR, no m/r/g appreciated Respiratory: CTAB, breathing and speaking comfortably on RA Abdominal: Soft, nontender, nondistended, normoactive bowel sounds Neurological/MSK: No gross focal deficit, bilateral lower extremity strength and sensation intact, bilateral straight leg raise negative, moves all  extremities grossly equally, tenderness to palpation over right paraspinal muscles of the lower thoracic and lumbar spines Psychiatric: Appropriate mood and affect  ASSESSMENT/PLAN:   Assessment & Plan Type 2 diabetes mellitus with diabetic neuropathy, with long-term current use of insulin  (HCC) Update A1c and urine microalbumin/creatinine ratio today.  Previous A1c 7.1.  However, patient's sugars have been elevated when she was not on Ozempic .  She has started back on this medication due to obtaining finances now which is reassuring.  Will follow-up A1c and further medication titration as indicated.  Of note, SGLT2's have caused her yeast infections in the past. Chronic right-sided low back pain without sciatica Acutely worsening after recent mobilization of heavy furniture.  While most likely muscle strain given my exam today, given her report of urinary incontinence (at rest and not her baseline urge/stress), will obtain stat MRI lumbar spine to further evaluate for cauda equina.  Sent in lidocaine  patches for acute pain relief since NSAIDs are relatively contraindicated given renal function.  Continue Tylenol .  Referral to PT placed.  Follow-up in 1 month. MPNST (malignant peripheral nerve sheath tumor) (HCC) Persistent paresthesias and numbness.  Advised to follow back up with neurosurgery for further recommendations.  In the meantime, will increase gabapentin  300 mg nightly to 600 mg nightly. Hypertension associated with diabetes (HCC) BP soft today, previously well-controlled. With higher sugars, could be osmotic diuresis. I have attempted to call patient and her spouse to inform to take half of her losartan -hydrochlorothiazide  (to make 50-12.5 mg dosing) for 1  week and come back for BP recheck with PCP. Will continue trying to get in touch.   Alicia Kenning, MD [facility]

## 2023-06-13 ENCOUNTER — Encounter: Payer: Self-pay | Admitting: Family Medicine

## 2023-06-13 LAB — MICROALBUMIN / CREATININE URINE RATIO
Creatinine, Urine: 190 mg/dL
Microalb/Creat Ratio: 5 mg/g{creat} (ref 0–29)
Microalbumin, Urine: 9.2 ug/mL

## 2023-06-19 ENCOUNTER — Telehealth: Payer: Self-pay

## 2023-06-19 NOTE — Telephone Encounter (Signed)
-----   Message from [facility] Genevia Kern S sent at 06/19/2023 10:37 AM EDT ----- Ok to schedule MRI at [facility].  Thank you,  Alicia West

## 2023-06-20 ENCOUNTER — Encounter: Payer: Self-pay | Admitting: Family Medicine

## 2023-06-20 ENCOUNTER — Telehealth: Payer: Self-pay

## 2023-06-20 ENCOUNTER — Ambulatory Visit (INDEPENDENT_AMBULATORY_CARE_PROVIDER_SITE_OTHER): Payer: Self-pay | Admitting: Family Medicine

## 2023-06-20 VITALS — BP 110/73 | HR 100 | Ht 69.0 in | Wt 235.0 lb

## 2023-06-20 DIAGNOSIS — I10 Essential (primary) hypertension: Secondary | ICD-10-CM

## 2023-06-20 DIAGNOSIS — I152 Hypertension secondary to endocrine disorders: Secondary | ICD-10-CM

## 2023-06-20 DIAGNOSIS — E1149 Type 2 diabetes mellitus with other diabetic neurological complication: Secondary | ICD-10-CM

## 2023-06-20 DIAGNOSIS — E1159 Type 2 diabetes mellitus with other circulatory complications: Secondary | ICD-10-CM | POA: Diagnosis not present

## 2023-06-20 DIAGNOSIS — C479 Malignant neoplasm of peripheral nerves and autonomic nervous system, unspecified: Secondary | ICD-10-CM | POA: Diagnosis not present

## 2023-06-20 DIAGNOSIS — R109 Unspecified abdominal pain: Secondary | ICD-10-CM | POA: Diagnosis not present

## 2023-06-20 MED ORDER — LOSARTAN POTASSIUM-HCTZ 100-25 MG PO TABS: 0.5000 | ORAL_TABLET | Freq: Every day | ORAL | Status: DC

## 2023-06-20 MED ORDER — GABAPENTIN 100 MG PO CAPS: 100.0000 mg | ORAL_CAPSULE | Freq: Every day | ORAL | 3 refills | Status: DC

## 2023-06-20 MED ORDER — GABAPENTIN 300 MG PO CAPS
300.0000 mg | ORAL_CAPSULE | Freq: Every day | ORAL | Status: DC
Start: 2023-06-20 — End: 2023-09-16

## 2023-06-20 NOTE — Telephone Encounter (Signed)
 Called [facility] and spoke with Marliss Simple to schedule a MRI appointment for patient. Was unable to schedule MRI due to Marliss Simple stating that patient's insurance is out of network.  Called centralized scheduling and spoke with Shelagh Derrick to schedule a MRI appointment for patient. Was unable to schedule the MRI due to insurance will not cover Free Standing. Referral specialist will have to speak to insurance about that and as well as the order need to be signed by the same doctor not two doctors.  Did send to Clovis Dar to advise the situation.  Christ Courier, CMA

## 2023-06-20 NOTE — Assessment & Plan Note (Signed)
 Controlled today though soft.  Discussed potentially trialing off medication, though through shared decision making, the patient would like to continue on half tablet for a total dosing of losartan -HCTZ 100-25 mg daily.

## 2023-06-20 NOTE — Patient Instructions (Addendum)
 Continue your lower dose of blood pressure medication.  Follow up with your neurosurgeon about your arm numbness: [facility]  [address]  [place], [place] [address]  [phone]  We will go to gabapentin  400 mg nightly. Take ONE 300 mg capsule and ONE 100 mg capsule ONLY.  I will look into a CT for your abdomen/kidneys.

## 2023-06-20 NOTE — Telephone Encounter (Signed)
-----   Message from [facility] Genevia Kern S sent at 06/20/2023  2:11 PM EDT ----- Try [facility]. ----- Message ----- From: Christ Courier, CMA Sent: 06/19/2023  11:59 AM EDT To: Nathan Bake, CMA  [facility] and spoke with Shelagh Derrick, she said that you will need to talk with the insurance because [facility] doesn't do [facility] and that is what the insurance requires. She also said that it need to be signed by either Dr. Fredrik Jensen or Dr. Rumball it can not be signed by both doctors. ----- Message ----- From: Nathan Bake, CMA Sent: 06/19/2023  11:31 AM EDT To: Christ Courier, CMA  Ok call [facility] ----- Message ----- From: Christ Courier, CMA Sent: 06/19/2023  11:21 AM EDT To: Nathan Bake, CMA  Consuello Denise, [facility] and spoke with Marliss Simple to schedule the MRI. She informed me that she wouldn't be able to schedule because patient's insurance is out of network with them.   Ammon Bales ----- Message ----- From: Nathan Bake, CMA Sent: 06/19/2023  10:38 AM EDT To: Bettyjane Brunet Green Pool  Ok to schedule MRI at [facility].  Thank you,  Clovis Dar

## 2023-06-20 NOTE — Telephone Encounter (Signed)
 Called [facility] and spoke with [name].  Mylinda Asa stated that the order needs to be faxed in and they will call patient to schedule the MRI  appointment. Fax number that was given was [phone].  Christ Courier, CMA

## 2023-06-20 NOTE — Assessment & Plan Note (Signed)
 Most likely cause of her continued numbness in the right>left arm.  Reassured by neurological exam today.  Gave number to contact her neurosurgeon for further evaluation.  Advised to take gabapentin  400 mg at night to help with pain with less sedation until then.

## 2023-06-20 NOTE — Progress Notes (Signed)
    SUBJECTIVE:   CHIEF COMPLAINT / HPI:   Numbness in the arm Has not been able to sleep well because of the numbness and discomfort in the right arm. She has to put her arm over the bed and let it hang to get the "blood to flow" back. She tried the increased dose of gabapentin  600 mg but it made her very sedated. Has not chatted with neurosurgeon yet.  Back pain/right side pain Lidocaine  has helped a little bit.  She still has pain.  Endorses intermittent sharp pains that radiate down to the groin.  She knows she has a renal cyst on the left but no history of renal stones that she knows of.  She was unaware of the MRI of her lumbar spine for concern of cauda equina with her urinary incontinence due to insurance issues.  She denies saddle anesthesia.  PERTINENT  PMH / PSH: Malignant peripheral nerve sheath tumor  OBJECTIVE:   BP 110/73   Pulse 100   Ht 5\' 9"  (1.753 m)   Wt 235 lb (106.6 kg)   SpO2 100%   BMI 34.70 kg/m   General: Alert and oriented, in NAD Skin: Warm, dry, and intact without lesions HEENT: NCAT, EOM grossly normal, midline nasal septum Respiratory: Breathing and speaking comfortably on RA Extremities: Moves all extremities grossly equally Neurological: No gross focal deficit, strength sensation normal in bilateral upper and lower extremities. Psychiatric: Appropriate mood and affect   ASSESSMENT/PLAN:   Assessment & Plan Hypertension associated with diabetes (HCC) Controlled today though soft.  Discussed potentially trialing off medication, though through shared decision making, the patient would like to continue on half tablet for a total dosing of losartan -HCTZ 100-25 mg daily. MPNST (malignant peripheral nerve sheath tumor) (HCC) Most likely cause of her continued numbness in the right>left arm.  Reassured by neurological exam today.  Gave number to contact her neurosurgeon for further evaluation.  Advised to take gabapentin  400 mg at night to help with pain  with less sedation until then. Side pain Accompanied with back pain.  Mild relief with lidocaine  patch at last visit.  She has not yet heard from PT.  Unable to get MRI due to insurance concerns though reassuringly with normal exam today and lack of saddle anesthesia.  Back/side pain remains most likely MSK in nature.  However, given persistence and current report of radiation down into the groin on the right side, will order CMP to assess renal/liver function and CT renal stone study to further assess possible renal colic.  Continue lidocaine  patches and awaiting follow-up with PT for now.  Genetta Kenning, MD [facility]

## 2023-06-21 ENCOUNTER — Ambulatory Visit: Payer: Self-pay | Admitting: Family Medicine

## 2023-06-21 LAB — COMPREHENSIVE METABOLIC PANEL WITH GFR
ALT: 17 IU/L (ref 0–32)
AST: 15 IU/L (ref 0–40)
Albumin: 3.9 g/dL (ref 3.8–4.9)
Alkaline Phosphatase: 130 IU/L — ABNORMAL HIGH (ref 44–121)
BUN/Creatinine Ratio: 13 (ref 9–23)
BUN: 16 mg/dL (ref 6–24)
Bilirubin Total: 0.2 mg/dL (ref 0.0–1.2)
CO2: 19 mmol/L — ABNORMAL LOW (ref 20–29)
Calcium: 9.9 mg/dL (ref 8.7–10.2)
Chloride: 107 mmol/L — ABNORMAL HIGH (ref 96–106)
Creatinine, Ser: 1.2 mg/dL — ABNORMAL HIGH (ref 0.57–1.00)
Globulin, Total: 3.3 g/dL (ref 1.5–4.5)
Glucose: 245 mg/dL — ABNORMAL HIGH (ref 70–99)
Potassium: 3.8 mmol/L (ref 3.5–5.2)
Sodium: 142 mmol/L (ref 134–144)
Total Protein: 7.2 g/dL (ref 6.0–8.5)
eGFR: 54 mL/min/{1.73_m2} — ABNORMAL LOW (ref >59)

## 2023-06-25 NOTE — Progress Notes (Signed)
 Spoke with patient. Informed of renal scan at [facility] on Fri. May 30th at 2:00 arrive at 1:45 for check in. Patient understood. Linnie Riches, CMA

## 2023-07-05 ENCOUNTER — Ambulatory Visit (HOSPITAL_COMMUNITY)

## 2023-07-07 ENCOUNTER — Other Ambulatory Visit: Payer: Self-pay | Admitting: Family Medicine

## 2023-07-07 DIAGNOSIS — E1140 Type 2 diabetes mellitus with diabetic neuropathy, unspecified: Secondary | ICD-10-CM

## 2023-07-08 ENCOUNTER — Other Ambulatory Visit: Payer: Self-pay

## 2023-07-08 DIAGNOSIS — I10 Essential (primary) hypertension: Secondary | ICD-10-CM

## 2023-07-08 MED ORDER — CARVEDILOL 25 MG PO TABS: 25.0000 mg | ORAL_TABLET | Freq: Two times a day (BID) | ORAL | 3 refills | Status: AC

## 2023-07-17 ENCOUNTER — Encounter: Payer: Self-pay | Admitting: Family Medicine

## 2023-07-17 DIAGNOSIS — T383X5A Adverse effect of insulin and oral hypoglycemic [antidiabetic] drugs, initial encounter: Secondary | ICD-10-CM | POA: Insufficient documentation

## 2023-08-28 ENCOUNTER — Encounter: Payer: Self-pay | Admitting: Family Medicine

## 2023-09-15 ENCOUNTER — Other Ambulatory Visit: Payer: Self-pay | Admitting: Family Medicine

## 2023-09-15 DIAGNOSIS — I82721 Chronic embolism and thrombosis of deep veins of right upper extremity: Secondary | ICD-10-CM

## 2023-09-15 DIAGNOSIS — E1149 Type 2 diabetes mellitus with other diabetic neurological complication: Secondary | ICD-10-CM

## 2023-09-15 DIAGNOSIS — C479 Malignant neoplasm of peripheral nerves and autonomic nervous system, unspecified: Secondary | ICD-10-CM

## 2023-09-15 DIAGNOSIS — E1140 Type 2 diabetes mellitus with diabetic neuropathy, unspecified: Secondary | ICD-10-CM

## 2023-10-21 ENCOUNTER — Other Ambulatory Visit: Payer: Self-pay

## 2023-10-21 ENCOUNTER — Other Ambulatory Visit: Payer: Self-pay | Admitting: Family Medicine

## 2023-10-21 DIAGNOSIS — E1140 Type 2 diabetes mellitus with diabetic neuropathy, unspecified: Secondary | ICD-10-CM

## 2023-10-21 MED ORDER — OMEPRAZOLE 20 MG PO CPDR: DELAYED_RELEASE_CAPSULE | ORAL | 3 refills | Status: AC

## 2023-11-13 ENCOUNTER — Encounter: Payer: Self-pay | Admitting: Family Medicine

## 2023-11-13 NOTE — Progress Notes (Signed)
 Completed form request by [employer], [place] See Media tab for copy completed document

## 2023-11-14 ENCOUNTER — Telehealth: Payer: Self-pay | Admitting: Student

## 2023-11-14 NOTE — Telephone Encounter (Signed)
 [facility] After Hours Line Phone Call Received after hours phone from patient.   Called but no answer. Will attempt second call later.   Damien Pinal, DO [facility], PGY-3 11/14/23 7:47 PM

## 2023-11-15 NOTE — Progress Notes (Signed)
 Subjective Patient ID: Alicia West is a 53 y.o. female.  Chief Complaint  Patient presents with  . Medication Problem    Reported starting erythromycin recently do to a tooth problem and due to loss of appetite she has not eaten anything. She has noticed diarrhea and nausea. Denies any nausea today    The following information was reviewed by members of the visit team:  Tobacco  Allergies  Meds  Med Hx  Surg Hx      53 year old female presents for evaluation of nausea, vomiting, diarrhea which began after starting erythromycin which was prescribed for a tooth infection.  States she had 1 day of medicine after which she developed vomiting and diarrhea.  States she has been unable to eat or drink anything.  She stopped taking erythromycin and feels somewhat better today.  However she has also developed nasal congestion, cough, body aches.  Unsure if this is due to the medication as well or if she is coming down with a viral illness.  She has no complaint of abdominal pain, no blood in stool or emesis, no fevers or chills.  No shortness of breath.    Review of Systems  Constitutional:  Negative for chills and fever.  HENT:  Positive for congestion. Negative for ear pain, rhinorrhea, sinus pressure, sinus pain and sore throat.   Respiratory:  Positive for cough.   Cardiovascular:  Negative for chest pain.  Gastrointestinal:  Positive for diarrhea, nausea and vomiting. Negative for abdominal pain.  Genitourinary:  Negative for dysuria, flank pain and frequency.  Musculoskeletal:  Positive for myalgias. Negative for arthralgias, neck pain and neck stiffness.  Skin:  Negative for rash.  Neurological:  Negative for dizziness and headaches.  Hematological:  Negative for adenopathy. Does not bruise/bleed easily.    Objective Physical Exam Vitals reviewed.  Constitutional:      General: She is not in acute distress.    Appearance: Normal appearance. She is not ill-appearing,  toxic-appearing or diaphoretic.  HENT:     Right Ear: Tympanic membrane, ear canal and external ear normal.     Left Ear: Tympanic membrane, ear canal and external ear normal.     Nose: Congestion present. No rhinorrhea.     Mouth/Throat:     Mouth: Mucous membranes are moist.     Pharynx: No oropharyngeal exudate or posterior oropharyngeal erythema.  Eyes:     Conjunctiva/sclera: Conjunctivae normal.  Cardiovascular:     Rate and Rhythm: Normal rate and regular rhythm.     Pulses: Normal pulses.  Pulmonary:     Effort: Pulmonary effort is normal. No respiratory distress.     Breath sounds: Normal breath sounds.  Abdominal:     General: Abdomen is flat. Bowel sounds are normal.     Palpations: Abdomen is soft.     Tenderness: There is no abdominal tenderness. There is no right CVA tenderness or left CVA tenderness.  Musculoskeletal:        General: Normal range of motion.     Cervical back: Normal range of motion.  Skin:    General: Skin is warm and dry.     Capillary Refill: Capillary refill takes less than 2 seconds.     Findings: No rash.  Neurological:     Mental Status: She is alert and oriented to person, place, and time.  Psychiatric:        Mood and Affect: Mood normal.     Assessment/Plan 53 year old female with nausea, vomiting, diarrhea  after starting erythromycin.  Also recent congestion, cough, body aches POC flu and COVID test are negative Symptoms do seem consistent with adverse reaction to erythromycin. Could also represent coincidental viral URI Vital signs are within normal limits, patient appears to be in no acute distress She has had no vomiting or diarrhea today Prescription for Zofran  due to nausea Patient request different antibiotic for treatment of dental issue Given allergy  history and prior adverse reactions will prescribe doxycycline  , And follow-up with PCP and/or dentist as needed for persistent symptoms Urgent Care Disposition:  Home  Care    Electronically signed: Franky Floria Finder, PA-C 11/15/2023  11:41 AM

## 2023-11-20 NOTE — Progress Notes (Signed)
    SUBJECTIVE:   CHIEF COMPLAINT / HPI:   Alicia West is a 53 y.o. female presenting for dental concern.  Dental Concern Previously given erythromycin for a tooth infection on 11/14/23; had cramping, N/V/D following and presented to [facility], where she was given Zofran  a switched from erythromycin to doxycycline . Has finished this course. Form request for [employer] in [place], [place] completed 11/13/23. Today, pain in right upper and lower tooth. Has referred ear, shoulder, neck pain. Using gabapentin , tylenol , heating pad, [name] to help with pain. No recent fevers. Having teeth pulled Mon 10/20 with an oral surgeon.  Other: Has had a headache for 2 weeks. Tylenol  not helping much. Patient relates this to high blood pressure.  Has been out of Ozempic  2 month; eating more sugar.food in general.  Healthcare Maintenance: Discussions deferred to annual exam - A1c - getting today - Diabetic foot exam - Diabetic eye exam - Pneumococcal vaccine 2nd dose - Flu vaccine - given today - Covid booster - Shingrix vaccine - Hep B Vaccine  PERTINENT  PMH / PSH: HTN, OSA, GERD, T2DM, HLD, CKD 3a, anemia  OBJECTIVE:   BP 126/86   Pulse 93   Temp 98.4 F (36.9 C) (Oral)   Ht 5' 9 (1.753 m)   Wt 244 lb 12.8 oz (111 kg)   SpO2 99%   BMI 36.15 kg/m    HEENT: Bottom right last molar with signs of rot at base, no erythema, edema, some tenderness to palpation around gum; upper right last good and no tenderness to palpation around gum; cavities throughout; back of throat without erythema, drainage Neck: No erythema, edema, warmth; area of mild tenderness to palpation in area of right submandibular gland  ASSESSMENT/PLAN:   Assessment & Plan Pain in a tooth or teeth No concern for infection given history and physical.  Patient already has appointment to see dental surgery for teeth removal, encouraged to keep appointment.  Also encouraged to see dentist  following. - Continue current symptom management regimen with Tylenol , heating pad, [name], gabapentin  Type 2 diabetes mellitus with diabetic neuropathy, unspecified whether long term insulin  use (HCC) A1c of 10.2 today. Reports no problem getting insulin , metformin . Has not been on Ozempic  2 mg weekly for the past 2 months. Has been doing poorly with her diet.  - Continue 48 u insulin  daily - Continue metformin  500 mg twice daily - Restarting Ozempic  at 0.25 mg weekly (smaller dose to percent (nausea/GI upset) - Referral placed to Dr. Koval for DM management on Friday 10/31 at 10:30 AM  - Consider increasing Ozempic  dose at this time Encounter for immunization - Flu shot given today  Upcoming appointment Friday 10/31 at 10:30am w Dr. Koval. Patient to call to schedule appointment for annual exam following.  Alan Flies, MD [facility]

## 2023-11-21 ENCOUNTER — Ambulatory Visit (INDEPENDENT_AMBULATORY_CARE_PROVIDER_SITE_OTHER)

## 2023-11-21 VITALS — BP 126/86 | HR 93 | Temp 98.4°F | Ht 69.0 in | Wt 244.8 lb

## 2023-11-21 DIAGNOSIS — E1140 Type 2 diabetes mellitus with diabetic neuropathy, unspecified: Secondary | ICD-10-CM | POA: Diagnosis not present

## 2023-11-21 DIAGNOSIS — K0889 Other specified disorders of teeth and supporting structures: Secondary | ICD-10-CM | POA: Diagnosis not present

## 2023-11-21 DIAGNOSIS — Z23 Encounter for immunization: Secondary | ICD-10-CM | POA: Diagnosis not present

## 2023-11-21 DIAGNOSIS — Z794 Long term (current) use of insulin: Secondary | ICD-10-CM

## 2023-11-21 LAB — POCT GLYCOSYLATED HEMOGLOBIN (HGB A1C): HbA1c, POC (controlled diabetic range): 10.2 % — AB (ref 0.0–7.0)

## 2023-11-21 MED ORDER — OZEMPIC (2 MG/DOSE) 8 MG/3ML ~~LOC~~ SOPN
2.0000 mg | PEN_INJECTOR | SUBCUTANEOUS | 5 refills | Status: DC
Start: 2023-11-21 — End: 2023-11-21

## 2023-11-21 MED ORDER — SEMAGLUTIDE(0.25 OR 0.5MG/DOS) 2 MG/1.5ML ~~LOC~~ SOPN: 0.2500 mg | PEN_INJECTOR | SUBCUTANEOUS | 0 refills | Status: DC

## 2023-11-21 NOTE — Patient Instructions (Addendum)
 Samule LOISE Ingles,   It was great seeing you in clinic today! You came in for a dental concern. I do not think you have a dangerous infection at this time. I do think you will need to get the teeth removed, and I'm glad you have an upcoming dentist appointment. Continue to manage your pain as you have been doing.  Your blood sugar was elevated today to 10.2%. I have refilled your Ozempic , which I am starting you back on a 0.25mg  weekly dose to prevent nausea/GI issues. You will have a follow-up with Dr. Koval (our pharmacist) on Friday 10/31 at 10:30am. Please bring your medications to this visit, as well as a log of your blood pressures and blood sugars.  Please call to make an appointment for your yearly physical!  Thank you for allowing me to be a part of your care team! Alan Flies, MD [facility] [address] [place], [place], [place] [address] [phone]

## 2023-11-21 NOTE — Assessment & Plan Note (Addendum)
 A1c of 10.2 today. Reports no problem getting insulin , metformin . Has not been on Ozempic  2 mg weekly for the past 2 months. Has been doing poorly with her diet.  - Continue 48 u insulin  daily - Continue metformin  500 mg twice daily - Restarting Ozempic  at 0.25 mg weekly (smaller dose to percent (nausea/GI upset) - Referral placed to Dr. Koval for DM management on Friday 10/31 at 10:30 AM  - Consider increasing Ozempic  dose at this time

## 2023-12-06 ENCOUNTER — Ambulatory Visit (INDEPENDENT_AMBULATORY_CARE_PROVIDER_SITE_OTHER): Admitting: Pharmacist

## 2023-12-06 ENCOUNTER — Encounter: Payer: Self-pay | Admitting: Pharmacist

## 2023-12-06 VITALS — BP 120/74 | HR 93 | Wt 242.2 lb

## 2023-12-06 DIAGNOSIS — E1140 Type 2 diabetes mellitus with diabetic neuropathy, unspecified: Secondary | ICD-10-CM | POA: Diagnosis not present

## 2023-12-06 DIAGNOSIS — Z794 Long term (current) use of insulin: Secondary | ICD-10-CM

## 2023-12-06 MED ORDER — OLOPATADINE HCL 0.2 % OP SOLN: 1.0000 [drp] | Freq: Every day | OPHTHALMIC | 5 refills | Status: AC | PRN

## 2023-12-06 MED ORDER — FREESTYLE LIBRE 3 PLUS SENSOR MISC: 11 refills | Status: AC

## 2023-12-06 MED ORDER — NOVOFINE PEN NEEDLE 32G X 6 MM MISC: 1.0000 | 12 refills | Status: AC | PRN

## 2023-12-06 MED ORDER — TOUJEO SOLOSTAR 300 UNIT/ML ~~LOC~~ SOPN: 56.0000 [IU] | PEN_INJECTOR | Freq: Every day | SUBCUTANEOUS | 3 refills | Status: AC

## 2023-12-06 NOTE — Assessment & Plan Note (Addendum)
 Diabetes longstanding currently uncontrolled. Patient is able to verbalize appropriate hypoglycemia management plan. Medication adherence appears good. Control is suboptimal due to dietary indiscretion. -Increased dose of basal insulin  Toujeo  (insulin  glargine) from 48 units to 56 units daily in the morning. -Decreased dose of GLP-1 Ozempic  (semaglutide ) from 0.25 mg to 10 clicks, with a plan to titrate back up as tolerated  -Continued metformin  500 mg BID.  - Libre 3+ CGM initiated and configured with phone APP. -Patient educated on purpose, proper use, and potential adverse effects of GLP1.  -Extensively discussed pathophysiology of diabetes, recommended lifestyle interventions, dietary effects on blood sugar control.  -Counseled on s/sx of and management of hypoglycemia.  -Next A1c anticipated December 2025.

## 2023-12-06 NOTE — Progress Notes (Signed)
 S:     Chief Complaint  Patient presents with   Medication Management    diabetes   53 y.o. female who presents for diabetes evaluation, education, and management. Patient arrives in good spirits and presents without any assistance. Spent majority of visit on CGM initiation.   Patient was referred and last seen by Primary Care Provider, Dr. Larraine, on 11/21/23.  At last visit, pt was restarted on Ozempic  (semaglutide ) 0.25mg .   At this visit, patient was given a freestyle libre 3+ sensor, which was placed in this office and connected to [facility] Data Share. Pt expressed confidence she would be able to place subsequent sensors.   PMH is significant for T2DM, HTN.   Current diabetes medications include: insulin  glargine, semaglutide , metformin  Current hypertension medications include: carvedilol , losartan -hydrochlorothiazide , spironolactone  Current hyperlipidemia medications include: atorvastatin   Patient reports adherence to taking all medications as prescribed.   Patient denies hypoglycemic events.  O:   Review of Systems  All other systems reviewed and are negative.   Physical Exam Vitals reviewed.  Constitutional:      Appearance: Normal appearance.  Musculoskeletal:     Left lower leg: No edema.  Neurological:     Mental Status: She is alert.  Psychiatric:        Mood and Affect: Mood normal.        Behavior: Behavior normal.        Thought Content: Thought content normal.        Judgment: Judgment normal.    Lab Results  Component Value Date   HGBA1C 10.2 (A) 11/21/2023   Vitals:   12/06/23 1042  BP: 120/74  Pulse: 93  SpO2: 99%    Lipid Panel     Component Value Date/Time   CHOL 211 (H) 10/16/2018 1549   TRIG 193 (H) 10/16/2018 1549   HDL 37 (L) 10/16/2018 1549   CHOLHDL 5.7 (H) 10/16/2018 1549   CHOLHDL 3.6 03/17/2013 1109   VLDL 28 03/17/2013 1109   LDLCALC 139 (H) 10/16/2018 1549   LDLDIRECT 85 11/24/2020 1005   LDLDIRECT 85  03/09/2014 1115    Clinical Atherosclerotic Cardiovascular Disease (ASCVD): No     A/P: Diabetes longstanding currently uncontrolled. Patient is able to verbalize appropriate hypoglycemia management plan. Medication adherence appears good. Control is suboptimal due to dietary indiscretion. -Increased dose of basal insulin  Toujeo  (insulin  glargine) from 48 units to 56 units daily in the morning. -Decreased dose of GLP-1 Ozempic  (semaglutide ) from 0.25 mg to 10 clicks, with a plan to titrate back up as tolerated  -Continued metformin  500 mg BID.  - Libre 3+ CGM initiated and configured with phone APP. -Patient educated on purpose, proper use, and potential adverse effects of GLP1.  -Extensively discussed pathophysiology of diabetes, recommended lifestyle interventions, dietary effects on blood sugar control.  -Counseled on s/sx of and management of hypoglycemia.  -Next A1c anticipated December 2025.   ASCVD risk - primary prevention in patient with diabetes. Last LDL is not at goal of <70  mg/dL. ASCVD risk factors include T2DM high intensity statin indicated.  -Continued atorvastatin  40 mg.  - Suggest lipid panel at next PCP visit/blood work.  Hypertension longstanding currently controlled. Blood pressure goal of <130/80 mmHg. Medication adherence is good. -Continued losartan -hydrochlorothiazide  100-25mg , spironolactone  25 mg, carvedilol  25 mb BID  Written patient instructions provided. Patient verbalized understanding of treatment plan.  Total time in face to face counseling 42 minutes.    Follow-up:  PCP clinic visit in 01/16/24  Patient seen with Lawson Mao, PharmD Candidate - PY3 student and Belvie Macintosh, PharmD - PY4 Candidate.

## 2023-12-06 NOTE — Patient Instructions (Addendum)
 It was nice to see you today!  Your goal blood sugar is 80-130 before eating and less than 180 after eating.  Medication Changes: Increase Toujeo  (insulin  glargine) from 48 units to 56 units once daily  Decrease Ozempic  (semaglutide ) to 10 clicks initially, then increase as tolerated  Continue all other medication the same.  Keep up the good work with diet and exercise. Aim for a diet full of vegetables, fruit and lean meats (chicken, turkey, fish). Try to limit salt intake by eating fresh or frozen vegetables (instead of canned), rinse canned vegetables prior to cooking and do not add any additional salt to meals.

## 2023-12-10 NOTE — Progress Notes (Signed)
 Reviewed and agree with Dr Rennis plan.

## 2023-12-14 ENCOUNTER — Other Ambulatory Visit: Payer: Self-pay | Admitting: Family Medicine

## 2023-12-14 DIAGNOSIS — I82721 Chronic embolism and thrombosis of deep veins of right upper extremity: Secondary | ICD-10-CM

## 2023-12-14 DIAGNOSIS — C479 Malignant neoplasm of peripheral nerves and autonomic nervous system, unspecified: Secondary | ICD-10-CM

## 2023-12-14 DIAGNOSIS — I10 Essential (primary) hypertension: Secondary | ICD-10-CM

## 2023-12-18 ENCOUNTER — Telehealth: Payer: Self-pay

## 2023-12-18 NOTE — Telephone Encounter (Signed)
 Patient calls nurse line to check status of spironolactone  refill request.   See separate med refill encounter.   Chiquita JAYSON English, RN

## 2024-01-07 ENCOUNTER — Other Ambulatory Visit: Payer: Self-pay | Admitting: Family Medicine

## 2024-01-07 DIAGNOSIS — E1165 Type 2 diabetes mellitus with hyperglycemia: Secondary | ICD-10-CM

## 2024-01-07 DIAGNOSIS — E1169 Type 2 diabetes mellitus with other specified complication: Secondary | ICD-10-CM

## 2024-01-10 ENCOUNTER — Encounter: Payer: Self-pay | Admitting: Family Medicine

## 2024-01-10 ENCOUNTER — Ambulatory Visit: Admitting: Family Medicine

## 2024-01-10 VITALS — BP 128/79 | HR 101 | Wt 245.5 lb

## 2024-01-10 DIAGNOSIS — L905 Scar conditions and fibrosis of skin: Secondary | ICD-10-CM | POA: Diagnosis not present

## 2024-01-10 NOTE — Progress Notes (Signed)
    SUBJECTIVE:   CHIEF COMPLAINT / HPI:   Patient reports a needle stuck in her arm.  She reports that she changed her glucose sensor yesterday and when she took it off she felt a hard piece.  She became concerned that it was the needle and reports that she thinks she saw and felt a small piece of metal in her underarm.  PERTINENT  PMH / PSH: Noncontributory  OBJECTIVE:   BP (!) 149/84   Pulse (!) 105   Wt 245 lb 8 oz (111.4 kg)   SpO2 98%   BMI 36.25 kg/m   General: Well-appearing, no distress Respiratory: Even, unlabored breathing Skin: 5 mm red raised papule in the left upper arm surrounded by a ring of old adhesive likely a spot where her CBG sensor used to sit.  Explored area with tweezers, hard object felt with palpation easily removed, scab.  After scab removal there is no hard object palpable, nothing is expressed when the papule is squeezed.  ASSESSMENT/PLAN:   Assessment & Plan Scar - After blunt debridement, no foreign body found -Reassurance provided -Advised use of triple antibiotic ointment over the next couple days to avoid infection -Discussed moving place where sensor is placed on the arm to avoid future excess scarring   Lucie Pinal, DO [facility]

## 2024-01-10 NOTE — Patient Instructions (Signed)
 It was wonderful to see you today!  The sharp edge that you are feeling in your arm was thankfully just a scab and not a piece of your blood glucose sensor.  You do have a small bit of scar tissue and some irritation so keep that area clean and you can use some triple antibiotic ointment on it over the next couple of days to keep it from being infected.  Please call [phone] with any questions about today's appointment.   If you need any additional refills, please call your pharmacy before calling the office.  Alicia Pinal, DO Family Medicine

## 2024-01-13 ENCOUNTER — Other Ambulatory Visit: Payer: Self-pay | Admitting: Family Medicine

## 2024-01-13 DIAGNOSIS — E1149 Type 2 diabetes mellitus with other diabetic neurological complication: Secondary | ICD-10-CM

## 2024-01-14 MED ORDER — GABAPENTIN 300 MG PO CAPS: 600.0000 mg | ORAL_CAPSULE | Freq: Every day | ORAL | 3 refills | Status: DC

## 2024-01-16 ENCOUNTER — Encounter: Payer: Self-pay | Admitting: Family Medicine

## 2024-01-16 ENCOUNTER — Ambulatory Visit: Admitting: Family Medicine

## 2024-01-16 VITALS — BP 112/74 | HR 109 | Ht 69.0 in | Wt 242.2 lb

## 2024-01-16 DIAGNOSIS — N1831 Chronic kidney disease, stage 3a: Secondary | ICD-10-CM

## 2024-01-16 DIAGNOSIS — Z794 Long term (current) use of insulin: Secondary | ICD-10-CM

## 2024-01-16 DIAGNOSIS — E1140 Type 2 diabetes mellitus with diabetic neuropathy, unspecified: Secondary | ICD-10-CM

## 2024-01-16 DIAGNOSIS — C479 Malignant neoplasm of peripheral nerves and autonomic nervous system, unspecified: Secondary | ICD-10-CM

## 2024-01-16 DIAGNOSIS — H35033 Hypertensive retinopathy, bilateral: Secondary | ICD-10-CM

## 2024-01-16 DIAGNOSIS — I152 Hypertension secondary to endocrine disorders: Secondary | ICD-10-CM

## 2024-01-16 DIAGNOSIS — E1149 Type 2 diabetes mellitus with other diabetic neurological complication: Secondary | ICD-10-CM

## 2024-01-16 LAB — POCT GLYCOSYLATED HEMOGLOBIN (HGB A1C): HbA1c, POC (controlled diabetic range): 10 % — AB (ref 0.0–7.0)

## 2024-01-16 NOTE — Patient Instructions (Addendum)
 Stop the Ozempic  for now  Increase your Toujeo  one unit a day, until you get to 65 units.  Stop the Eliquis  for now.  If you get increased swelling in your arms, let our office know right away.

## 2024-01-17 DIAGNOSIS — G5610 Other lesions of median nerve, unspecified upper limb: Secondary | ICD-10-CM | POA: Insufficient documentation

## 2024-01-17 MED ORDER — GABAPENTIN 300 MG PO CAPS: 600.0000 mg | ORAL_CAPSULE | Freq: Two times a day (BID) | ORAL | 1 refills | Status: AC

## 2024-01-17 NOTE — Assessment & Plan Note (Signed)
 Established problem Well Controlled. Patient is at goal of pain control. No signs of complications, medication side effects, or red flags. Continue gabapentin  300 - 600 mg twice a day prn

## 2024-01-17 NOTE — Assessment & Plan Note (Signed)
 Established problem Recommended return to ophthalmologist for monitoring Referral placed

## 2024-01-17 NOTE — Assessment & Plan Note (Signed)
 Established problem. Adequate blood pressure control.  No evidence of new end organ damage.  Tolerating medication without significant adverse effects.  Plan to continue current blood pressure medication regiment.

## 2024-01-17 NOTE — Assessment & Plan Note (Signed)
 Established problem Increasing glargin from 54 to 65 units daily with titration one unit a day increase

## 2024-01-17 NOTE — Assessment & Plan Note (Signed)
 Established problem Uncontrolled.  Patient is not at goal of A1c < 7.0%. 12/06/23 office visit with Dr Amalia, Ms Alicia West advised to Decreased dose of GLP-1 Ozempic  (semaglutide ) from 0.25 mg to 10 clicks, with a plan to titrate back up as tolerated  Took 30 clicks (~ 0.19 mg) of semaglutide  pen two days ago. She became nauseated yesterday.  She is taking Toujeo  (glargine) 56 units daily in the morning.  She is taking metformin  500 mg bid  CGM report 11/27 to 12/5 (fell out midday 12/5) < 70 was < 4% < 54 was < 1% > 180 was 25 % > 250 5%  Avg 268 GMI 9.7% Lab Results  Component Value Date   HGBA1C 10.0 (A) 01/16/2024  Prior A1c 10.2% (11/21/23) Start: Increase Glargin 1 unit daily until at 65 units daily Stop: Semaglutide  for now.  Likely start Victoza  if restart a GLP-1 Continue: metformin  500 mg twice a day Return to clinic 2 weeks

## 2024-01-17 NOTE — Progress Notes (Signed)
 Alicia West is alone Sources of clinical information for visit is/are patient. Nursing assessment for this office visit was reviewed with the patient for accuracy and revision.   Previous Report(s) Reviewed: office notes     08/13/2019    9:02 AM  Depression screen PHQ 2/9  Decreased Interest 0  Down, Depressed, Hopeless 0  PHQ - 2 Score 0   Flowsheet Row Office Visit from 07/29/2019 in [facility] Of [place]. [facility] Office Visit from 04[facility]04/2016 in [facility] Of [facility]. [facility] Office Visit from 01/25/2016 in [facility] - [facility] Of [place]  Thoughts that you would be better off dead, or of hurting yourself in some way Not at all Not at all Not at all  PHQ-9 Total Score 19 0 0       08/13/2019    9:02 AM 01/28/2019    8:43 AM 04/16/2018    3:05 PM 01/31/2018    8:36 AM 01/07/2018    3:04 PM  Fall Risk   Falls in the past year? 0 0  0  0  0   Number falls in past yr: 0 0  0   0   Injury with Fall?   0   0   Follow up Falls evaluation completed   Falls evaluation completed        Data saved with a previous flowsheet row definition       08/13/2019    9:02 AM 07/29/2019    3:24 PM 01/28/2019    8:43 AM  PHQ9 SCORE ONLY  PHQ-9 Total Score 0  19  3      Data saved with a previous flowsheet row definition    There are no preventive care reminders to display for this patient.  Health Maintenance Due  Topic Date Due   Hepatitis B Vaccines 19-59 Average Risk (1 of 3 - 19+ 3-dose series) Never done   Zoster Vaccines- Shingrix (1 of 2) Never done   Pneumococcal Vaccine: 50+ Years (2 of 2 - PCV) 04/20/2018   OPHTHALMOLOGY EXAM  11/29/2023      History/P.E. limitations: none  There are no preventive care reminders to display for this patient.  Diabetes Health Maintenance Due  Topic Date Due   OPHTHALMOLOGY EXAM  11/29/2023   HEMOGLOBIN A1C  07/16/2024     Health Maintenance Due  Topic Date Due   Hepatitis B Vaccines 19-59 Average Risk (1 of 3 - 19+ 3-dose series) Never done   Zoster Vaccines- Shingrix (1 of 2) Never done   Pneumococcal Vaccine: 50+ Years (2 of 2 - PCV) 04/20/2018   OPHTHALMOLOGY EXAM  11/29/2023     Chief Complaint  Patient presents with   Medical Management of Chronic Issues     Discussed the use of AI scribe software for clinical note transcription with the patient, who gave verbal consent to proceed.  History of Present Illness No AI scribe used for visit      SDOH Screenings   Tobacco Use: Low Risk (01/16/2024)   --------------------------------------------------------------------------------------------------------------------------------------------- Visit Problem List with Assessment and Plan   Assessment and Plan Assessment & Plan No AI scribe used for visit      No problem-specific Assessment & Plan notes found for this encounter.

## 2024-01-17 NOTE — Assessment & Plan Note (Signed)
 Established problem Continues with right forearm numbness when right arm posteromedial resting against a surface, like in bed. Responds well to gabapentin   No loss of arm / shoulder function on either side.  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   Continue gabapentin  300 to 600 mg twice a day as needed for pain Recommend follow-up with [facility] in [place].

## 2024-01-30 ENCOUNTER — Other Ambulatory Visit: Payer: Self-pay | Admitting: Family Medicine

## 2024-01-30 DIAGNOSIS — I10 Essential (primary) hypertension: Secondary | ICD-10-CM

## 2024-02-17 ENCOUNTER — Other Ambulatory Visit: Payer: Self-pay | Admitting: Family Medicine

## 2024-02-17 ENCOUNTER — Encounter: Payer: Self-pay | Admitting: Internal Medicine

## 2024-02-17 ENCOUNTER — Ambulatory Visit: Payer: Self-pay | Admitting: Family Medicine

## 2024-02-17 DIAGNOSIS — Z1231 Encounter for screening mammogram for malignant neoplasm of breast: Secondary | ICD-10-CM

## 2024-02-17 DIAGNOSIS — E1140 Type 2 diabetes mellitus with diabetic neuropathy, unspecified: Secondary | ICD-10-CM

## 2024-02-17 MED ORDER — SEMAGLUTIDE(0.25 OR 0.5MG/DOS) 2 MG/1.5ML ~~LOC~~ SOPN: PEN_INJECTOR | SUBCUTANEOUS | 0 refills | Status: AC

## 2024-02-17 NOTE — Progress Notes (Signed)
 Ms Wurth is to injected [place] 25 to 30 clicks.  She can advance from 25 clicks (~0.35 mg) to 30 clicks (~0.4 mg) ozempic  as tolerated by increasing one click more weekly until reaching 30 clicks or intolerance.

## 2024-03-05 ENCOUNTER — Encounter

## 2024-03-05 ENCOUNTER — Ambulatory Visit

## 2024-03-10 ENCOUNTER — Ambulatory Visit: Admitting: *Deleted

## 2024-03-10 VITALS — Ht 69.0 in | Wt 235.0 lb

## 2024-03-10 DIAGNOSIS — Z860100 Personal history of colon polyps, unspecified: Secondary | ICD-10-CM

## 2024-03-10 DIAGNOSIS — Z83719 Family history of colon polyps, unspecified: Secondary | ICD-10-CM

## 2024-03-10 DIAGNOSIS — Z800 Family history of malignant neoplasm of digestive organs: Secondary | ICD-10-CM

## 2024-03-10 MED ORDER — NA SULFATE-K SULFATE-MG SULF 17.5-3.13-1.6 GM/177ML PO SOLN: 1.0000 | Freq: Once | ORAL | 0 refills | Status: AC

## 2024-03-10 NOTE — Progress Notes (Signed)
 Pt's name and DOB verified at the beginning of the pre-visit with 2 identifie  Pt denies any difficulty with ambulating,sitting, laying down or rolling side to side  Pt has no issues moving head neck or swallowing  No egg or soy allergy  known to patient   HX PONV  No FH of Malignant Hyperthermia  Pt is not on home 02   Pt is not on blood thinners   Pt denies issues with constipation   Pt is not on dialysis  Pt denise any abnormal heart rhythms   Pt denies any upcoming cardiac testing  Patient's chart reviewed by Norleen Schillings CNRA prior to pre-visit and patient appropriate for the [facility].  Pre-visit completed and red dot placed by patient's name on their procedure day (on provider's schedule).    Visit by phone  Pt states weight is 235 lb    Pt states that she needed to go due to pt she is taking care of issues.  Instructed pt to review instructions in my chart and call main # if has any questions. Infromed her that med will be sent to pharmacy today and she can pick up ASAP Pt given  both [facility] main # and MD on call # prior to instructions.  Pt states that she needed to go due to pt she is taking care of issues.  Instructed pt where to find PV instructions in My Chart.  Instructed pt to review instructions again prior to procedure and call main # given if has any questions or any issues. Pt states they will.

## 2024-03-11 ENCOUNTER — Encounter: Payer: Self-pay | Admitting: Physician Assistant

## 2024-03-19 ENCOUNTER — Encounter: Admitting: Internal Medicine

## 2025-03-05 ENCOUNTER — Encounter
